# Patient Record
Sex: Male | Born: 1965
Health system: Southern US, Community
[De-identification: ages and names within clinical notes are randomized; demographics above are authoritative.]

## PROBLEM LIST (undated history)

## (undated) DIAGNOSIS — E119 Type 2 diabetes mellitus without complications: Secondary | ICD-10-CM

## (undated) DIAGNOSIS — I739 Peripheral vascular disease, unspecified: Secondary | ICD-10-CM

## (undated) DIAGNOSIS — T148XXA Other injury of unspecified body region, initial encounter: Secondary | ICD-10-CM

## (undated) DIAGNOSIS — J189 Pneumonia, unspecified organism: Secondary | ICD-10-CM

## (undated) DIAGNOSIS — M1990 Unspecified osteoarthritis, unspecified site: Secondary | ICD-10-CM

## (undated) DIAGNOSIS — A4902 Methicillin resistant Staphylococcus aureus infection, unspecified site: Secondary | ICD-10-CM

## (undated) DIAGNOSIS — F32A Depression, unspecified: Secondary | ICD-10-CM

## (undated) DIAGNOSIS — F329 Major depressive disorder, single episode, unspecified: Secondary | ICD-10-CM

## (undated) DIAGNOSIS — R51 Headache: Secondary | ICD-10-CM

## (undated) DIAGNOSIS — E785 Hyperlipidemia, unspecified: Secondary | ICD-10-CM

## (undated) DIAGNOSIS — K219 Gastro-esophageal reflux disease without esophagitis: Secondary | ICD-10-CM

## (undated) DIAGNOSIS — I10 Essential (primary) hypertension: Secondary | ICD-10-CM

## (undated) DIAGNOSIS — G709 Myoneural disorder, unspecified: Secondary | ICD-10-CM

## (undated) DIAGNOSIS — F419 Anxiety disorder, unspecified: Secondary | ICD-10-CM

## (undated) HISTORY — DX: Depression, unspecified: F32.A

## (undated) HISTORY — DX: Hyperlipidemia, unspecified: E78.5

## (undated) HISTORY — DX: Headache: R51

## (undated) HISTORY — DX: Anxiety disorder, unspecified: F41.9

## (undated) HISTORY — PX: TONSILLECTOMY: SUR1361

## (undated) HISTORY — DX: Myoneural disorder, unspecified: G70.9

## (undated) HISTORY — DX: Major depressive disorder, single episode, unspecified: F32.9

## (undated) HISTORY — PX: ROTATOR CUFF REPAIR: SHX139

## (undated) HISTORY — DX: Unspecified osteoarthritis, unspecified site: M19.90

## (undated) HISTORY — DX: Essential (primary) hypertension: I10

## (undated) HISTORY — DX: Gastro-esophageal reflux disease without esophagitis: K21.9

---

## 2000-09-15 ENCOUNTER — Emergency Department (HOSPITAL_COMMUNITY): Admission: EM | Admit: 2000-09-15 | Discharge: 2000-09-15 | Payer: Self-pay | Admitting: *Deleted

## 2003-11-04 ENCOUNTER — Ambulatory Visit (HOSPITAL_COMMUNITY): Admission: RE | Admit: 2003-11-04 | Discharge: 2003-11-04 | Payer: Self-pay | Admitting: Orthopedic Surgery

## 2004-01-16 ENCOUNTER — Ambulatory Visit (HOSPITAL_BASED_OUTPATIENT_CLINIC_OR_DEPARTMENT_OTHER): Admission: RE | Admit: 2004-01-16 | Discharge: 2004-01-16 | Payer: Self-pay | Admitting: Orthopedic Surgery

## 2004-04-06 ENCOUNTER — Ambulatory Visit (HOSPITAL_COMMUNITY): Admission: RE | Admit: 2004-04-06 | Discharge: 2004-04-06 | Payer: Self-pay | Admitting: Orthopedic Surgery

## 2004-11-10 ENCOUNTER — Encounter: Admission: RE | Admit: 2004-11-10 | Discharge: 2004-11-10 | Payer: Self-pay | Admitting: Internal Medicine

## 2010-07-21 ENCOUNTER — Emergency Department (HOSPITAL_COMMUNITY): Admission: EM | Admit: 2010-07-21 | Discharge: 2010-07-22 | Payer: Self-pay | Admitting: Emergency Medicine

## 2010-10-12 ENCOUNTER — Encounter: Payer: Self-pay | Admitting: Internal Medicine

## 2010-10-12 ENCOUNTER — Ambulatory Visit (INDEPENDENT_AMBULATORY_CARE_PROVIDER_SITE_OTHER): Payer: BC Managed Care – PPO | Admitting: Internal Medicine

## 2010-10-12 ENCOUNTER — Encounter (INDEPENDENT_AMBULATORY_CARE_PROVIDER_SITE_OTHER): Payer: Self-pay | Admitting: *Deleted

## 2010-10-12 ENCOUNTER — Ambulatory Visit (INDEPENDENT_AMBULATORY_CARE_PROVIDER_SITE_OTHER)
Admission: RE | Admit: 2010-10-12 | Discharge: 2010-10-12 | Disposition: A | Discharge status: Home / Self Care | Payer: BC Managed Care – PPO | Source: Ambulatory Visit | Attending: Internal Medicine | Admitting: Internal Medicine

## 2010-10-12 ENCOUNTER — Other Ambulatory Visit: Payer: Self-pay | Admitting: Internal Medicine

## 2010-10-12 ENCOUNTER — Other Ambulatory Visit: Payer: BC Managed Care – PPO

## 2010-10-12 DIAGNOSIS — R51 Headache: Secondary | ICD-10-CM | POA: Insufficient documentation

## 2010-10-12 DIAGNOSIS — M1990 Unspecified osteoarthritis, unspecified site: Secondary | ICD-10-CM

## 2010-10-12 DIAGNOSIS — B351 Tinea unguium: Secondary | ICD-10-CM

## 2010-10-12 DIAGNOSIS — R0609 Other forms of dyspnea: Secondary | ICD-10-CM | POA: Insufficient documentation

## 2010-10-12 DIAGNOSIS — Z8709 Personal history of other diseases of the respiratory system: Secondary | ICD-10-CM | POA: Insufficient documentation

## 2010-10-12 DIAGNOSIS — G4730 Sleep apnea, unspecified: Secondary | ICD-10-CM

## 2010-10-12 DIAGNOSIS — E785 Hyperlipidemia, unspecified: Secondary | ICD-10-CM

## 2010-10-12 DIAGNOSIS — K219 Gastro-esophageal reflux disease without esophagitis: Secondary | ICD-10-CM | POA: Insufficient documentation

## 2010-10-12 DIAGNOSIS — R0989 Other specified symptoms and signs involving the circulatory and respiratory systems: Secondary | ICD-10-CM

## 2010-10-12 DIAGNOSIS — F17200 Nicotine dependence, unspecified, uncomplicated: Secondary | ICD-10-CM | POA: Insufficient documentation

## 2010-10-12 DIAGNOSIS — F411 Generalized anxiety disorder: Secondary | ICD-10-CM | POA: Insufficient documentation

## 2010-10-12 DIAGNOSIS — I10 Essential (primary) hypertension: Secondary | ICD-10-CM

## 2010-10-12 DIAGNOSIS — F329 Major depressive disorder, single episode, unspecified: Secondary | ICD-10-CM

## 2010-10-12 DIAGNOSIS — R519 Headache, unspecified: Secondary | ICD-10-CM | POA: Insufficient documentation

## 2010-10-12 DIAGNOSIS — F3289 Other specified depressive episodes: Secondary | ICD-10-CM | POA: Insufficient documentation

## 2010-10-12 DIAGNOSIS — F1011 Alcohol abuse, in remission: Secondary | ICD-10-CM | POA: Insufficient documentation

## 2010-10-12 LAB — HEPATIC FUNCTION PANEL
ALT: 39 U/L (ref 0–53)
AST: 31 U/L (ref 0–37)
Albumin: 4.2 g/dL (ref 3.5–5.2)
Alkaline Phosphatase: 75 U/L (ref 39–117)
Total Protein: 6.9 g/dL (ref 6.0–8.3)

## 2010-10-12 LAB — CBC WITH DIFFERENTIAL/PLATELET
Basophils Relative: 0.4 % (ref 0.0–3.0)
HCT: 47.9 % (ref 39.0–52.0)
Hemoglobin: 16.8 g/dL (ref 13.0–17.0)
Lymphocytes Relative: 20.4 % (ref 12.0–46.0)
Monocytes Relative: 9.4 % (ref 3.0–12.0)
Neutro Abs: 6.9 10*3/uL (ref 1.4–7.7)
RBC: 5.11 Mil/uL (ref 4.22–5.81)

## 2010-10-12 LAB — LIPID PANEL
HDL: 40.9 mg/dL (ref >39.00)
Total CHOL/HDL Ratio: 7
Triglycerides: 341 mg/dL — ABNORMAL HIGH (ref 0.0–149.0)

## 2010-10-12 LAB — BASIC METABOLIC PANEL
CO2: 29 mEq/L (ref 19–32)
Calcium: 9.5 mg/dL (ref 8.4–10.5)
GFR: 94.86 mL/min (ref >60.00)
Potassium: 5 mEq/L (ref 3.5–5.1)
Sodium: 139 mEq/L (ref 135–145)

## 2010-10-13 ENCOUNTER — Encounter: Payer: Self-pay | Admitting: Internal Medicine

## 2010-10-20 ENCOUNTER — Encounter: Payer: Self-pay | Admitting: Internal Medicine

## 2010-10-20 ENCOUNTER — Encounter (INDEPENDENT_AMBULATORY_CARE_PROVIDER_SITE_OTHER): Payer: BC Managed Care – PPO

## 2010-10-20 ENCOUNTER — Ambulatory Visit (INDEPENDENT_AMBULATORY_CARE_PROVIDER_SITE_OTHER): Payer: BC Managed Care – PPO | Admitting: Internal Medicine

## 2010-10-20 DIAGNOSIS — E785 Hyperlipidemia, unspecified: Secondary | ICD-10-CM

## 2010-10-20 DIAGNOSIS — I10 Essential (primary) hypertension: Secondary | ICD-10-CM

## 2010-10-20 DIAGNOSIS — R0609 Other forms of dyspnea: Secondary | ICD-10-CM

## 2010-10-20 DIAGNOSIS — F411 Generalized anxiety disorder: Secondary | ICD-10-CM

## 2010-10-20 NOTE — Letter (Signed)
Summary: Lipid Letter  [facility]  [address] [place], [place] [address]   Phone: [phone]  Fax: [phone]    10/13/2010  Jason [name] [address] [place], [place]  [address]  Dear Jason [name]:  We have carefully reviewed your last lipid profile from  and the results are noted below with a summary of recommendations for lipid management.    Cholesterol:       288     Goal: <200   HDL "good" Cholesterol:   57.84     Goal: >40   LDL "bad" Cholesterol:   188     Goal: <130 wow, this is high   Triglycerides:       341.0     Goal: <150    please follow-up soon    TLC Diet (Therapeutic Lifestyle Change): Saturated Fats & Transfatty acids should be kept < 7% of total calories ***Reduce Saturated Fats Polyunstaurated Fat can be up to 10% of total calories Monounsaturated Fat Fat can be up to 20% of total calories Total Fat should be no greater than 25-35% of total calories Carbohydrates should be 50-60% of total calories Protein should be approximately 15% of total calories Fiber should be at least 20-30 grams a day ***Increased fiber may help lower LDL Total Cholesterol should be < 200mg /day Consider adding plant stanol/sterols to diet (example: Benacol spread) ***A higher intake of unsaturated fat may reduce Triglycerides and Increase HDL    Adjunctive Measures (may lower LIPIDS and reduce risk of Heart Attack) include: Aerobic Exercise (20-30 minutes 3-4 times a week) Limit Alcohol Consumption Weight Reduction Aspirin 75-81 mg a day by mouth (if not allergic or contraindicated) Dietary Fiber 20-30 grams a day by mouth     Current Medications: 1)    Sertraline Hcl 50 Mg Tabs (Sertraline hcl) .... One by mouth at bedtime 2)    Benicar 20 Mg Tabs (Olmesartan medoxomil) .... One by mouth once daily for high blood pressure  If you have any questions, please call. We appreciate being able to work with you.   Sincerely,    [facility] Etta Grandchild MD

## 2010-10-20 NOTE — Assessment & Plan Note (Signed)
Summary: NEW [employer] PT--#---PKG---STC   Vital Signs:  Patient profile:   45 year old male Height:      70 inches Weight:      214 pounds BMI:     30.82 O2 Sat:      97 % on Room air Temp:     98.2 degrees F oral Pulse rate:   74 / minute Pulse rhythm:   regular Resp:     16 per minute BP supine:   152 / 84  (right arm) BP sitting:   150 / 80  (left arm) Cuff size:   large  Vitals Entered By: Rock Nephew CMA (October 12, 2010 9:05 AM)  Nutrition Counseling: Patient's BMI is greater than 25 and therefore counseled on weight management options.  O2 Flow:  Room air  Primary Care Provider:  Etta Grandchild MD   History of Present Illness: new to me with multiple complaints..... 1. SOB for several months and recent hx. of PNA 2. sleep apnea - his wife says that he does not breathe for long periods at night 3. discolored great toenails 4. feeling anxious for more than one year 5. Htn. evaluation  Dyspepsia History:      He has no alarm features of dyspepsia including no history of melena, hematochezia, dysphagia, persistent vomiting, or involuntary weight loss > 5%.  There is a prior history of GERD.     Preventive Screening-Counseling & Management  Alcohol-Tobacco     Alcohol drinks/day: >5     Alcohol type: beer     >5/day in last 3 mos: yes     Alcohol Counseling: to STOP drinking     Feels need to cut down: no     Feels annoyed by complaints: no     Feels guilty re: drinking: no     Needs 'eye opener' in am: no     Smoking Status: current     Smoking Cessation Counseling: yes     Smoke Cessation Stage: precontemplative     Packs/Day: 2.0     Year Started: 1982     Pack years: 39     Tobacco Counseling: to quit use of tobacco products  Caffeine-Diet-Exercise     Does Patient Exercise: no  Hep-HIV-STD-Contraception     Hepatitis Risk: no risk noted     HIV Risk: no risk noted     STD Risk: no risk noted  Safety-Violence-Falls     Seat Belt Use: no    Sexual History:  currently monogamous.        Drug Use:  no.        Blood Transfusions:  no.    Medications Prior to Update: 1)  None  Current Medications (verified): 1)  Sertraline Hcl 50 Mg Tabs (Sertraline Hcl) .... One By Mouth At Bedtime  Allergies (verified): No Known Drug Allergies  Past History:  Past Medical History: Anxiety Depression GERD Headache Hyperlipidemia Hypertension Osteoarthritis Pneumonia, hx of - 3 months ago  Past Surgical History: Rotator cuff repair  Family History: Family History Heart Disease Family History Stroke Family History Emotional/Mental Illness  Social History: Occupation:Electrician Married Current Smoker Alcohol use-yes 12 pack per day of beer Drug use-no Regular exercise-no Smoking Status:  current Drug Use:  no Does Patient Exercise:  no Education:  Environmental manager Use:  no Packs/Day:  2.0 Hepatitis Risk:  no risk noted HIV Risk:  no risk noted STD Risk:  no risk noted Sexual History:  currently monogamous Blood  Transfusions:  no  Review of Systems       The patient complains of weight gain, dyspnea on exertion, prolonged cough, and depression.  The patient denies anorexia, fever, weight loss, decreased hearing, hoarseness, chest pain, syncope, peripheral edema, headaches, hemoptysis, abdominal pain, melena, hematochezia, severe indigestion/heartburn, hematuria, genital sores, muscle weakness, suspicious skin lesions, difficulty walking, unusual weight change, abnormal bleeding, enlarged lymph nodes, angioedema, and testicular masses.   CV:  Denies chest pain or discomfort, difficulty breathing at night, difficulty breathing while lying down, fainting, fatigue, leg cramps with exertion, lightheadness, near fainting, palpitations, and swelling of feet. Resp:  Complains of cough, excessive snoring, hypersomnolence, morning headaches, and shortness of breath; denies chest discomfort, chest pain with inspiration,  coughing up blood, pleuritic, sputum productive, and wheezing. Derm:  Complains of changes in nail beds; denies changes in color of skin, dryness, excessive perspiration, flushing, hair loss, insect bite(s), itching, lesion(s), poor wound healing, and rash. Psych:  Complains of anxiety and depression; denies easily angered, easily tearful, irritability, mental problems, panic attacks, sense of great danger, suicidal thoughts/plans, thoughts of violence, and unusual visions or sounds.  Physical Exam  General:  alert, well-developed, well-nourished, well-hydrated, appropriate dress, cooperative to examination, good hygiene, and overweight-appearing.   Head:  normocephalic and atraumatic.   Eyes:  vision grossly intact and pupils equal.   Ears:  R ear normal and L ear normal.   Mouth:  Oral mucosa and oropharynx without lesions or exudates.  Teeth in good repair. Neck:  supple, full ROM, no masses, no thyromegaly, no JVD, normal carotid upstroke, no carotid bruits, no cervical lymphadenopathy, and no neck tenderness.   Chest Wall:  No deformities, masses, tenderness or gynecomastia noted. Lungs:  normal respiratory effort, no intercostal retractions, no accessory muscle use, normal breath sounds, no dullness, no fremitus, no crackles, and no wheezes.   Heart:  normal rate, regular rhythm, no murmur, no gallop, no rub, and no JVD.   Abdomen:  soft, non-tender, normal bowel sounds, no distention, no masses, no guarding, no rigidity, no rebound tenderness, no abdominal hernia, no inguinal hernia, no hepatomegaly, and no splenomegaly.   Msk:  No deformity or scoliosis noted of thoracic or lumbar spine.   Pulses:  R and L carotid,radial,femoral,dorsalis pedis and posterior tibial pulses are full and equal bilaterally Extremities:  No clubbing, cyanosis, edema, or deformity noted with normal full range of motion of all joints.   Neurologic:  No cranial nerve deficits noted. Station and gait are normal.  Plantar reflexes are down-going bilaterally. DTRs are symmetrical throughout. Sensory, motor and coordinative functions appear intact. Skin:  both great toenails show mild thickening with subungual debris, all the other nails are normal. turgor normal, color normal, no rashes, no suspicious lesions, no ecchymoses, no petechiae, no purpura, no ulcerations, and no edema.   Cervical Nodes:  no anterior cervical adenopathy and no posterior cervical adenopathy.   Axillary Nodes:  no R axillary adenopathy and no L axillary adenopathy.   Inguinal Nodes:  no R inguinal adenopathy and no L inguinal adenopathy.   Psych:  Oriented X3, memory intact for recent and remote, normally interactive, good eye contact, not depressed appearing, not agitated, not suicidal, not homicidal, and slightly anxious.   Additional Exam:  EKG is normal   Impression & Recommendations:  Problem # 1:  HYPERTENSION (ICD-401.9)  Orders: Venipuncture (29562) TLB-Lipid Panel (80061-LIPID) TLB-BMP (Basic Metabolic Panel-BMET) (80048-METABOL) TLB-CBC Platelet - w/Differential (85025-CBCD) TLB-Hepatic/Liver Function Pnl (80076-HEPATIC) TLB-TSH (Thyroid Stimulating Hormone) (  84443-TSH) TLB-BNP (B-Natriuretic Peptide) (83880-BNPR) EKG w/ Interpretation (93000) Tobacco use cessation intermediate 3-10 minutes (71696)  His updated medication list for this problem includes:    Benicar 20 Mg Tabs (Olmesartan medoxomil) ..... One by mouth once daily for high blood pressure  BP today: 150/80  Problem # 2:  ANXIETY (ICD-300.00) Assessment: New  His updated medication list for this problem includes:    Sertraline Hcl 50 Mg Tabs (Sertraline hcl) ..... One by mouth at bedtime  Discussed medication use and relaxation techniques.   Problem # 3:  DYSPNEA/SHORTNESS OF BREATH (ICD-786.09) Assessment: New I do not think this is cardiac but I do think it is respiratory and probably COPD - I have ordered a f/up chest xray and PFT's and  asked him to stop smoking Orders: Venipuncture (78938) TLB-Lipid Panel (80061-LIPID) TLB-BMP (Basic Metabolic Panel-BMET) (80048-METABOL) TLB-CBC Platelet - w/Differential (85025-CBCD) TLB-Hepatic/Liver Function Pnl (80076-HEPATIC) TLB-TSH (Thyroid Stimulating Hormone) (84443-TSH) TLB-BNP (B-Natriuretic Peptide) (83880-BNPR) T-2 View CXR (71020TC) Pulmonary Referral (Pulmonary) EKG w/ Interpretation (93000) Tobacco use cessation intermediate 3-10 minutes (10175)  Problem # 4:  ONYCHOMYCOSIS, TOENAILS (ICD-110.1) Assessment: New no treatment started at this time, I will check his LFT's today and see if he is a candidate for lamisil but I worry about his EtOH intake  Problem # 5:  SLEEP APNEA (ICD-780.57) Assessment: New I think he needs a sleep study Orders: Venipuncture (10258) TLB-Lipid Panel (80061-LIPID) TLB-BMP (Basic Metabolic Panel-BMET) (80048-METABOL) TLB-CBC Platelet - w/Differential (85025-CBCD) TLB-Hepatic/Liver Function Pnl (80076-HEPATIC) TLB-TSH (Thyroid Stimulating Hormone) (84443-TSH) TLB-BNP (B-Natriuretic Peptide) (83880-BNPR) Sleep Disorder Referral (Sleep Disorder)  Problem # 6:  ETHANOL ABUSE (ICD-305.00) Assessment: New  Problem # 7:  TOBACCO USE (ICD-305.1) Assessment: New  Orders: Tobacco use cessation intermediate 3-10 minutes (52778)  Encouraged smoking cessation and discussed different methods for smoking cessation.   Complete Medication List: 1)  Sertraline Hcl 50 Mg Tabs (Sertraline hcl) .... One by mouth at bedtime 2)  Benicar 20 Mg Tabs (Olmesartan medoxomil) .... One by mouth once daily for high blood pressure  Patient Instructions: 1)  Please schedule a follow-up appointment in 2 weeks. 2)  Tobacco is very bad for your health and your loved ones! You Should stop smoking!. 3)  Stop Smoking Tips: Choose a Quit date. Cut down before the Quit date. decide what you will do as a substitute when you feel the urge to  smoke(gum,toothpick,exercise). 4)  It is important that you exercise regularly at least 20 minutes 5 times a week. If you develop chest pain, have severe difficulty breathing, or feel very tired , stop exercising immediately and seek medical attention. 5)  You need to lose weight. Consider a lower calorie diet and regular exercise.  6)  It is not healthy  for men to drink more than 2-3 drinks per day or for women to drink more than 1-2 drinks per day. 7)  Check your Blood Pressure regularly. If it is above: you should make an appointment. Prescriptions: BENICAR 20 MG TABS (OLMESARTAN MEDOXOMIL) One by mouth once daily for high blood pressure  #56 x 0   Entered and Authorized by:   Etta Grandchild MD   Signed by:   Etta Grandchild MD on 10/12/2010   Method used:   Samples Given   RxID:   [id number] SERTRALINE HCL 50 MG TABS (SERTRALINE HCL) One by mouth at bedtime  #30 x 11   Entered and Authorized by:   Etta Grandchild MD   Signed by:  Etta Grandchild MD on 10/12/2010   Method used:   Print then Give to Patient   RxID:   [id number]    Orders Added: 1)  Venipuncture [14782] 2)  TLB-Lipid Panel [80061-LIPID] 3)  TLB-BMP (Basic Metabolic Panel-BMET) [80048-METABOL] 4)  TLB-CBC Platelet - w/Differential [85025-CBCD] 5)  TLB-Hepatic/Liver Function Pnl [80076-HEPATIC] 6)  TLB-TSH (Thyroid Stimulating Hormone) [84443-TSH] 7)  TLB-BNP (B-Natriuretic Peptide) [83880-BNPR] 8)  T-2 View CXR [71020TC] 9)  Sleep Disorder Referral [Sleep Disorder] 10)  Pulmonary Referral [Pulmonary] 11)  EKG w/ Interpretation [93000] 12)  Tobacco use cessation intermediate 3-10 minutes [99406] 13)  New Patient Level V [99205]    Preventive Care Screening  Last Tetanus Booster:    Date:  08/29/2002    Results:  Historical    Prevention & Chronic Care Immunizations   Influenza vaccine: Not documented   Influenza vaccine deferral: Refused  (10/12/2010)    Tetanus booster: 08/29/2002:  Historical    Pneumococcal vaccine: Not documented  Other Screening   Smoking status: current  (10/12/2010)   Smoking cessation counseling: yes  (10/12/2010)  Lipids   Total Cholesterol: Not documented   LDL: Not documented   LDL Direct: Not documented   HDL: Not documented   Triglycerides: Not documented    SGOT (AST): Not documented   SGPT (ALT): Not documented   Alkaline phosphatase: Not documented   Total bilirubin: Not documented  Hypertension   Last Blood Pressure: 150 / 80  (10/12/2010)   Serum creatinine: Not documented   Serum potassium Not documented  Self-Management Support :    Hypertension self-management support: Not documented    Lipid self-management support: Not documented

## 2010-10-22 ENCOUNTER — Encounter: Payer: Self-pay | Admitting: Internal Medicine

## 2010-10-26 NOTE — Assessment & Plan Note (Signed)
Summary: PROBLEMS WITH BP MEDS/NWS   Vital Signs:  Patient profile:   45 year old male Height:      70 inches Weight:      214 pounds BMI:     30.82 O2 Sat:      98 % on Room air Temp:     98.0 degrees F oral Pulse rate:   74 / minute Pulse rhythm:   regular Resp:     16 per minute BP sitting:   152 / 80  (left arm) Cuff size:   large  Vitals Entered By: Rock Nephew CMA (October 20, 2010 8:30 AM)  Nutrition Counseling: Patient's BMI is greater than 25 and therefore counseled on weight management options.  O2 Flow:  Room air CC: follow-up visit// Pt also c/o dizziness since starting ne medication Is Patient Diabetic? No Pain Assessment Patient in pain? no       Does patient need assistance? Functional Status Self care Ambulation Normal   Primary Care Provider:  Etta Grandchild MD  CC:  follow-up visit// Pt also c/o dizziness since starting ne medication.  History of Present Illness:  Hypertension Follow-Up      This is a 45 year old man who presents for Hypertension follow-up.  The patient reports lightheadedness, but denies urinary frequency, headaches, edema, impotence, rash, and fatigue.  The patient denies the following associated symptoms: chest pain, chest pressure, exercise intolerance, dyspnea, palpitations, syncope, leg edema, and pedal edema.  Compliance with medications (by patient report) has been near 100%.  The patient reports that dietary compliance has been fair.  The patient reports no exercise.  Adjunctive measures currently used by the patient include salt restriction and relaxation.    Preventive Screening-Counseling & Management  Alcohol-Tobacco     Alcohol drinks/day: >5     Alcohol type: beer     >5/day in last 3 mos: yes     Alcohol Counseling: to STOP drinking     Feels need to cut down: no     Feels annoyed by complaints: no     Feels guilty re: drinking: no     Needs 'eye opener' in am: no     Smoking Status: current     Smoking  Cessation Counseling: yes     Smoke Cessation Stage: precontemplative     Packs/Day: 2.0     Year Started: 1982     Pack years: 88     Tobacco Counseling: to quit use of tobacco products  Hep-HIV-STD-Contraception     Hepatitis Risk: no risk noted     HIV Risk: no risk noted     STD Risk: no risk noted      Sexual History:  currently monogamous.        Drug Use:  no.        Blood Transfusions:  no.    Clinical Review Panels:  Immunizations   Last Tetanus Booster:  Historical (08/29/2002)  Lipid Management   Cholesterol:  288 (10/12/2010)   HDL (good cholesterol):  40.90 (10/12/2010)  Diabetes Management   Creatinine:  0.9 (10/12/2010)  CBC   WBC:  10.5 (10/12/2010)   RBC:  5.11 (10/12/2010)   Hgb:  16.8 (10/12/2010)   Hct:  47.9 (10/12/2010)   Platelets:  254.0 (10/12/2010)   MCV  93.7 (10/12/2010)   MCHC  35.1 (10/12/2010)   RDW  14.0 (10/12/2010)   PMN:  65.8 (10/12/2010)   Lymphs:  20.4 (10/12/2010)   Monos:  9.4 (10/12/2010)  Eosinophils:  4.0 (10/12/2010)   Basophil:  0.4 (10/12/2010)  Complete Metabolic Panel   Glucose:  99 (10/12/2010)   Sodium:  139 (10/12/2010)   Potassium:  5.0 (10/12/2010)   Chloride:  102 (10/12/2010)   CO2:  29 (10/12/2010)   BUN:  11 (10/12/2010)   Creatinine:  0.9 (10/12/2010)   Albumin:  4.2 (10/12/2010)   Total Protein:  6.9 (10/12/2010)   Calcium:  9.5 (10/12/2010)   Total Bili:  0.5 (10/12/2010)   Alk Phos:  75 (10/12/2010)   SGPT (ALT):  39 (10/12/2010)   SGOT (AST):  31 (10/12/2010)   Medications Prior to Update: 1)  Sertraline Hcl 50 Mg Tabs (Sertraline Hcl) .... One By Mouth At Bedtime 2)  Benicar 20 Mg Tabs (Olmesartan Medoxomil) .... One By Mouth Once Daily For High Blood Pressure  Current Medications (verified): 1)  Sertraline Hcl 50 Mg Tabs (Sertraline Hcl) .... One By Mouth At Bedtime 2)  Livalo 4 Mg Tabs (Pitavastatin Calcium) .... One By Mouth Once Daily For Cholesterol  Allergies (verified): 1)  !  Benicar (Olmesartan Medoxomil)  Past History:  Past Medical History: Last updated: 10/12/2010 Anxiety Depression GERD Headache Hyperlipidemia Hypertension Osteoarthritis Pneumonia, hx of - 3 months ago  Past Surgical History: Last updated: 10/12/2010 Rotator cuff repair  Family History: Last updated: 10/12/2010 Family History Heart Disease Family History Stroke Family History Emotional/Mental Illness  Social History: Last updated: 10/20/2010 Occupation: Personnel officer Married Current Smoker Alcohol use-yes 12 pack per day of beer Drug use-no Regular exercise-no  Risk Factors: Alcohol Use: >5 (10/20/2010) >5 drinks/d w/in last 3 months: yes (10/20/2010) Exercise: no (10/12/2010)  Risk Factors: Smoking Status: current (10/20/2010) Packs/Day: 2.0 (10/20/2010)  Family History: Reviewed history from 10/12/2010 and no changes required. Family History Heart Disease Family History Stroke Family History Emotional/Mental Illness  Social History: Reviewed history from 10/12/2010 and no changes required. Occupation: Personnel officer Married Current Smoker Alcohol use-yes 12 pack per day of beer Drug use-no Regular exercise-no  Review of Systems  The patient denies hoarseness, chest pain, syncope, dyspnea on exertion, peripheral edema, prolonged cough, headaches, hemoptysis, abdominal pain, suspicious skin lesions, transient blindness, difficulty walking, and depression.   Psych:  Denies anxiety, depression, easily angered, easily tearful, irritability, mental problems, panic attacks, sense of great danger, suicidal thoughts/plans, thoughts of violence, unusual visions or sounds, and thoughts /plans of harming others.  Physical Exam  General:  alert, well-developed, well-nourished, well-hydrated, appropriate dress, cooperative to examination, good hygiene, and overweight-appearing.   Head:  normocephalic and atraumatic.   Mouth:  Oral mucosa and oropharynx without  lesions or exudates.  Teeth in good repair. Neck:  supple, full ROM, no masses, no thyromegaly, no JVD, normal carotid upstroke, no carotid bruits, no cervical lymphadenopathy, and no neck tenderness.   Lungs:  normal respiratory effort, no intercostal retractions, no accessory muscle use, normal breath sounds, no dullness, no fremitus, no crackles, and no wheezes.   Heart:  normal rate, regular rhythm, no murmur, no gallop, no rub, and no JVD.   Abdomen:  soft, non-tender, normal bowel sounds, no distention, no masses, no guarding, no rigidity, no rebound tenderness, no abdominal hernia, no inguinal hernia, no hepatomegaly, and no splenomegaly.   Msk:  No deformity or scoliosis noted of thoracic or lumbar spine.   Pulses:  R and L carotid,radial,femoral,dorsalis pedis and posterior tibial pulses are full and equal bilaterally Extremities:  No clubbing, cyanosis, edema, or deformity noted with normal full range of motion of all joints.  Neurologic:  No cranial nerve deficits noted. Station and gait are normal. Plantar reflexes are down-going bilaterally. DTRs are symmetrical throughout. Sensory, motor and coordinative functions appear intact. Skin:  both great toenails show mild thickening with subungual debris, all the other nails are normal. turgor normal, color normal, no rashes, no suspicious lesions, no ecchymoses, no petechiae, no purpura, no ulcerations, and no edema.   Cervical Nodes:  no anterior cervical adenopathy and no posterior cervical adenopathy.   Inguinal Nodes:  no R inguinal adenopathy and no L inguinal adenopathy.   Psych:  Oriented X3, memory intact for recent and remote, normally interactive, good eye contact, not anxious appearing, not depressed appearing, not agitated, and not suicidal.   Additional Exam:  EKG is normal today and unchanged from his prior EKG   Impression & Recommendations:  Problem # 1:  HYPERLIPIDEMIA (ICD-272.4) Assessment Deteriorated  His updated  medication list for this problem includes:    Livalo 4 Mg Tabs (Pitavastatin calcium) ..... One by mouth once daily for cholesterol  Labs Reviewed: SGOT: 31 (10/12/2010)   SGPT: 39 (10/12/2010)   HDL:40.90 (10/12/2010)  Chol:288 (10/12/2010)  Trig:341.0 (10/12/2010)  Orders: Nutrition Referral (Nutrition)  Problem # 2:  HYPERTENSION (ICD-401.9) Assessment: Deteriorated  The following medications were removed from the medication list:    Benicar 20 Mg Tabs (Olmesartan medoxomil) ..... One by mouth once daily for high blood pressure  Orders: Nutrition Referral (Nutrition) EKG w/ Interpretation (93000)  BP today: 152/80 Prior BP: 152/84 (10/12/2010)  Labs Reviewed: K+: 5.0 (10/12/2010) Creat: : 0.9 (10/12/2010)   Chol: 288 (10/12/2010)   HDL: 40.90 (10/12/2010)   TG: 341.0 (10/12/2010)  Problem # 3:  ANXIETY (ICD-300.00) Assessment: Improved  His updated medication list for this problem includes:    Sertraline Hcl 50 Mg Tabs (Sertraline hcl) ..... One by mouth at bedtime  Complete Medication List: 1)  Sertraline Hcl 50 Mg Tabs (Sertraline hcl) .... One by mouth at bedtime 2)  Livalo 4 Mg Tabs (Pitavastatin calcium) .... One by mouth once daily for cholesterol  Patient Instructions: 1)  Please schedule a follow-up appointment in 1 month. 2)  Tobacco is very bad for your health and your loved ones! You Should stop smoking!. 3)  Stop Smoking Tips: Choose a Quit date. Cut down before the Quit date. decide what you will do as a substitute when you feel the urge to smoke(gum,toothpick,exercise). 4)  It is important that you exercise regularly at least 20 minutes 5 times a week. If you develop chest pain, have severe difficulty breathing, or feel very tired , stop exercising immediately and seek medical attention. 5)  You need to lose weight. Consider a lower calorie diet and regular exercise.  6)  It is not healthy  for men to drink more than 2-3 drinks per day or for women to drink  more than 1-2 drinks per day. 7)  Check your Blood Pressure regularly. If it is above 140/90: you should make an appointment. Prescriptions: LIVALO 4 MG TABS (PITAVASTATIN CALCIUM) One by mouth once daily for cholesterol  #84 x 0   Entered and Authorized by:   Etta Grandchild MD   Signed by:   Etta Grandchild MD on 10/20/2010   Method used:   Samples Given   RxID:   [id number]    Orders Added: 1)  Nutrition Referral [Nutrition] 2)  EKG w/ Interpretation [93000] 3)  Est. Patient Level IV [08657]

## 2010-10-29 ENCOUNTER — Institutional Professional Consult (permissible substitution) (INDEPENDENT_AMBULATORY_CARE_PROVIDER_SITE_OTHER): Payer: BC Managed Care – PPO | Admitting: Pulmonary Disease

## 2010-10-29 ENCOUNTER — Encounter: Payer: Self-pay | Admitting: Pulmonary Disease

## 2010-10-29 DIAGNOSIS — G4730 Sleep apnea, unspecified: Secondary | ICD-10-CM

## 2010-11-02 ENCOUNTER — Encounter: Payer: Self-pay | Admitting: Pulmonary Disease

## 2010-11-04 NOTE — Assessment & Plan Note (Signed)
Summary: SOB PER DEBRA- DR Maisie Fus JONES///KP   Allergies: 1)  ! Benicar (Olmesartan Medoxomil)   Other Orders: Carbon Monoxide diffusing w/capacity (81191) Lung Volumes/Gas dilution or washout (47829) Spirometry (Pre & Post) [phone])

## 2010-11-04 NOTE — Assessment & Plan Note (Signed)
Summary: SLEEP CONSULT PER DR Sanda Linger- [name]   Visit Type:  Initial Consult Copy to:  Sanda Linger MD Primary Provider/Referring Provider:  Etta Grandchild MD  CC:  Sleep Consult. Pt c/o waking up multiple times at night, snores, and and stops breathing at night.  History of Present Illness: 45 yo male for sleep evaluation.  His wife has noted that he snores, and stops breathing while asleep.  He wakes up at times hearing himself snore.  He does not feel like he has any trouble with his sleep otherwise, and this is more something his wife noticed.  He goes to bed at 9pm.  He falls asleep quickly.  He does not use anything to help sleep.  He wakes up several times during the night.  He wakes up at 530am, and feels okay.  He denies headaches.  He gets sleepy at times in the afternoon.  He would like to take naps, but doesn't have the time to do so.  He is not using anything to stay awake.  He talks in his sleep.  He denies sleep walking, bruxism, or nightmares.  He is a restless sleeper, but denies restless legs.  There is no history of sleep hallucinations, sleep paralysis, or cataplexy.  He drinks at least 12 beers per day.  He smokes 2 packs per day.  There is no history of thyroid disease.  He was started on sertraline a few weeks ago, and this has helped his mood.  He thinks his sleep is better also.  His weight has been steady.  His Epworth score is 11 out of 24.  Current Medications (verified): 1)  Sertraline Hcl 50 Mg Tabs (Sertraline Hcl) .... One By Mouth At Bedtime 2)  Livalo 4 Mg Tabs (Pitavastatin Calcium) .... One By Mouth Once Daily For Cholesterol  Allergies (verified): 1)  ! Benicar (Olmesartan Medoxomil)  Past History:  Past Medical History: Reviewed history from 10/12/2010 and no changes required. Anxiety Depression GERD Headache Hyperlipidemia Hypertension Osteoarthritis Pneumonia, hx of - 3 months ago  Past Surgical History: Reviewed history from  10/12/2010 and no changes required. Rotator cuff repair  Family History: Reviewed history from 10/12/2010 and no changes required. Family History Heart Disease Family History Stroke: pgf Family History Emotional/Mental Illness asthma: mgf emphysema: father MI: father  Social History: Reviewed history from 10/20/2010 and no changes required. Occupation: Personnel officer Married Current Smoker. 2 ppd. 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  The patient denies shortness of breath at rest, productive cough, non-productive cough, coughing up blood, chest pain, irregular heartbeats, abdominal pain, difficulty swallowing, sore throat, sneezing, itching, ear ache, depression, hand/feet swelling, rash, change in color of mucus, and fever.    Vital Signs:  Patient profile:   45 year old male Height:      70 inches Weight:      215.38 pounds BMI:     31.02 O2 Sat:      98 % on Room air Temp:     98.1 degrees F oral Pulse rate:   62 / minute BP sitting:   134 / 86  (right arm) Cuff size:   large  Vitals Entered By: Carver Fila (October 29, 2010  9:03 AM)  O2 Flow:  Room air CC: Sleep Consult. Pt c/o waking up multiple times at night, snores, and stops breathing at night Comments meds and allergies updated Phone number updated Carver Fila  October 29, 2010 9:04 AM    Physical Exam  General:  normal appearance, healthy appearing, and obese.   Eyes:  PERRLA and EOMI.   Nose:  clear nasal discharge.   Mouth:  MP 3, no exudate Neck:  no JVD.   Chest Wall:  no deformities noted Lungs:  clear bilaterally to auscultation and percussion Heart:  regular rate and rhythm, S1, S2 without murmurs,  rubs, gallops, or clicks Abdomen:  bowel sounds positive; abdomen soft and non-tender without masses, or organomegaly Pulses:  pulses normal Extremities:  no clubbing, cyanosis, edema, or deformity noted Neurologic:  normal CN II-XII and strength normal.   Cervical Nodes:  no significant adenopathy Psych:  alert and cooperative; normal mood and affect; normal attention span and concentration   Impression & Recommendations:  Problem # 1:  SLEEP APNEA (ICD-780.57) His symptoms are suggestive of sleep apnea.  I explained how sleep apnea can affect his health.  Reviewed driving precautions and importance of maintaining his weight.  Explained that he would need further testing to assess.  He does not feel like he has a problem with his sleep.  He feels that he has improved since being started on sertraline.  I explained that this could be related to SSRI REM suppression, and as a result decreased incidence of apnea.  He is reluctant to have any testing done, and did not feel an in-lab sleep test is warranted.  I explained that [employer] [place] usually does not approve home sleep testing w/o meeting certain criteria (which he does not meet).  As such he has agreed to do an overnight oximetry.  I explained that if this shows significant desaturation he will need to reconsider further sleep testing.  Problem # 2:  Jason [name] ABUSE (ICD-305.00) Explained how continue excessive alcohol use can affect his sleep and his health.   Problem # 3:  TOBACCO USE (ICD-305.1) I explained how tobacco abuse can affect his sleep and his health.   Complete Medication List: 1)  Sertraline Hcl 50 Mg Tabs (Sertraline hcl) .... One by mouth at bedtime 2)  Livalo 4 Mg Tabs (Pitavastatin calcium) .... One by mouth once daily for cholesterol  Other Orders: Consultation Level IV (16109) DME Referral (DME)  Patient Instructions: 1)  Will arrange for oxygen test at night 2)  Will call to schedule follow up after  test result reviewed

## 2010-11-16 NOTE — Miscellaneous (Signed)
Summary: Room air overnight oximetry  Clinical Lists Changes Test time 8hrs 18 min.  Basal SpO2 93%, low SpO2 83%.  Spent 3.1 min with SpO2 < 88%.  ODI 8 per hr.  Results d/w pt over the phone.  I explained that his oxygen desaturation pattern is very suggestive of sleep apnea, and that further testing would be warranted.  Again explained how sleep apnea can have impact on his sleep quality, daytime alertness, and cardiovascular health.  Also explained how sleep disruption can affect his mood and headaches.  He is still very reluctant to do any further testing or therapy for sleep apnea.  He would like to follow up with Dr. Yetta Barre with primary care to discuss further, and then decide if he would be interested in proceeding with additional testing for sleep apnea.  Will forward this information to Dr. Yetta Barre for his review.

## 2010-12-06 ENCOUNTER — Encounter: Payer: Self-pay | Admitting: Pulmonary Disease

## 2010-12-18 ENCOUNTER — Emergency Department (HOSPITAL_COMMUNITY)
Admission: EM | Admit: 2010-12-18 | Discharge: 2010-12-19 | Disposition: A | Discharge status: Home / Self Care | Payer: BC Managed Care – PPO | Attending: Emergency Medicine | Admitting: Emergency Medicine

## 2010-12-18 DIAGNOSIS — R296 Repeated falls: Secondary | ICD-10-CM | POA: Insufficient documentation

## 2010-12-18 DIAGNOSIS — I10 Essential (primary) hypertension: Secondary | ICD-10-CM | POA: Insufficient documentation

## 2010-12-18 DIAGNOSIS — M129 Arthropathy, unspecified: Secondary | ICD-10-CM | POA: Insufficient documentation

## 2010-12-18 DIAGNOSIS — M25519 Pain in unspecified shoulder: Secondary | ICD-10-CM | POA: Insufficient documentation

## 2010-12-18 DIAGNOSIS — IMO0002 Reserved for concepts with insufficient information to code with codable children: Secondary | ICD-10-CM | POA: Insufficient documentation

## 2011-01-14 NOTE — Op Note (Signed)
NAME:  OSRIC, KLOPF                        ACCOUNT NO.:  [id number]   MEDICAL RECORD NO.:  [id number]                   PATIENT TYPE:  AMB   LOCATION:  [facility]                                  FACILITY:  [facility]   PHYSICIAN:  Georges Lynch. Darrelyn Hillock, M.D.             DATE OF BIRTH:  05-30-1966   DATE OF PROCEDURE:  01/16/2004  DATE OF DISCHARGE:                                 OPERATIVE REPORT   SURGEON:  Georges Lynch. Darrelyn Hillock, M.D.   ASSISTANT:  Ebbie Ridge. Paitsel, P.A.   PREOPERATIVE DIAGNOSIS:  Severe degenerative arthritis of the right  acromioclavicular joint.   POSTOPERATIVE DIAGNOSIS:  Severe degenerative arthritis of the right  acromioclavicular joint.   OPERATION:  Resection, distal clavicle, acromioclavicular joint, right.   PROCEDURE IN DETAIL:  Under general anesthesia, routine orthopedic prep and  drape of the right shoulder was carried out.  A transverse incision was made  over the distal AC joint.  Bleeder identified and cauterized.  I stripped  the periosteum and then resected the distal clavicle with the oscillating  saw.  I then bone-waxed the raw end of the clavicle and then inserted a  Gelfoam and closed the wound in layers in the usual fashion.  Sterile  dressings were applied.   The patient was placed in a sling.  He was given 1 g of Ancef preop.  He did  have an interscalene block preop.                                               Ronald A. Darrelyn Hillock, M.D.    RAG/MEDQ  D:  01/16/2004  T:  01/17/2004  Job:  [id number]

## 2011-01-14 NOTE — Op Note (Signed)
NAME:  Jason [name], Jason [name]                        ACCOUNT NO.:  [id number]   MEDICAL RECORD NO.:  [id number]                   PATIENT TYPE:  AMB   LOCATION:  DAY                                  FACILITY:  [facility]   PHYSICIAN:  Georges Lynch. Darrelyn Hillock, M.D.             DATE OF BIRTH:  08-Sep-1965   DATE OF PROCEDURE:  04/06/2004  DATE OF DISCHARGE:                                 OPERATIVE REPORT   SURGEON:  Dr. Darrelyn Hillock   ASSISTANT:  Terie Purser, PA-C.   PREOPERATIVE DIAGNOSIS:  Severe degenerative arthritis of the left  acromioclavicular joint with deterioration of his meniscus.   POSTOPERATIVE DIAGNOSIS:  Severe degenerative arthritis of the left  acromioclavicular joint with deterioration of his meniscus.   OPERATION:  Resection of the distal clavicle and the meniscus from the [facility]  joint, left shoulder.   DESCRIPTION OF PROCEDURE:  Under general anesthesia, the patient first prior  to the general anesthetic, had a local interscalene block.  After that, a  routine prepping and draping were carried out.  The incision was made over  the anterior aspect of the acromioclavicular joint, bleeders identified and  cauterized.  The patient had 1 g of IV Ancef preop.  At this time, we went  right down and immediately identified the [facility] joint.  We debrided the [facility]  joint first, then protected the undersurface of the joint.  I then utilized  the oscillating saw and resected the distal clavicle.  We thoroughly  irrigated out the area.  I bone waxed the bleeding end of the clavicle.  The  main structures looked fine.  I thoroughly irrigated it out and  reapproximated all the soft tissue structures in the usual fashion.  The  skin was closed with metal staples.  A sterile Neosporin dressing was  applied, and he was placed in a shoulder sling.                                               Ronald A. Darrelyn Hillock, M.D.    RAG/MEDQ  D:  04/06/2004  T:  04/06/2004  Job:  [id number]

## 2012-07-03 ENCOUNTER — Ambulatory Visit: Payer: BC Managed Care – PPO | Admitting: Internal Medicine

## 2012-07-03 ENCOUNTER — Telehealth: Payer: Self-pay | Admitting: Internal Medicine

## 2012-07-03 NOTE — Telephone Encounter (Signed)
Caller: Pam/Spouse; Patient Name: Jason [name]; PCP: Sanda Linger (Adults only); Best Callback Phone Number: [phone].  Spouse calling about dizziness.  Onset several days ago.  States having vertigo as well.  Denies emergent symptoms per dizziness protocol; advised appt within 24 hours.  Appt scheduled 1015 07/03/12 with Dr. Yetta Barre.

## 2012-07-04 ENCOUNTER — Ambulatory Visit (INDEPENDENT_AMBULATORY_CARE_PROVIDER_SITE_OTHER): Payer: BC Managed Care – PPO | Admitting: Internal Medicine

## 2012-07-04 ENCOUNTER — Encounter: Payer: Self-pay | Admitting: Internal Medicine

## 2012-07-04 VITALS — BP 162/80 | HR 87 | Temp 98.9°F | Resp 16 | Ht 70.0 in | Wt 213.4 lb

## 2012-07-04 DIAGNOSIS — G4730 Sleep apnea, unspecified: Secondary | ICD-10-CM

## 2012-07-04 DIAGNOSIS — E785 Hyperlipidemia, unspecified: Secondary | ICD-10-CM

## 2012-07-04 DIAGNOSIS — R0789 Other chest pain: Secondary | ICD-10-CM

## 2012-07-04 DIAGNOSIS — R05 Cough: Secondary | ICD-10-CM

## 2012-07-04 DIAGNOSIS — Z136 Encounter for screening for cardiovascular disorders: Secondary | ICD-10-CM

## 2012-07-04 DIAGNOSIS — F329 Major depressive disorder, single episode, unspecified: Secondary | ICD-10-CM

## 2012-07-04 DIAGNOSIS — F3289 Other specified depressive episodes: Secondary | ICD-10-CM

## 2012-07-04 DIAGNOSIS — I10 Essential (primary) hypertension: Secondary | ICD-10-CM

## 2012-07-04 DIAGNOSIS — F411 Generalized anxiety disorder: Secondary | ICD-10-CM

## 2012-07-04 DIAGNOSIS — R059 Cough, unspecified: Secondary | ICD-10-CM | POA: Insufficient documentation

## 2012-07-04 MED ORDER — NEBIVOLOL HCL 10 MG PO TABS: 10.0000 mg | ORAL_TABLET | Freq: Every day | ORAL | Status: DC

## 2012-07-04 MED ORDER — FLUOXETINE HCL 20 MG PO CAPS: 20.0000 mg | ORAL_CAPSULE | Freq: Every day | ORAL | Status: DC

## 2012-07-04 NOTE — Assessment & Plan Note (Signed)
He stopped zoloft - says he did not like it, I have asked him to try prozac

## 2012-07-04 NOTE — Assessment & Plan Note (Signed)
I think he has developed a smoker's cough and possibly lung disease, I will check his CXR today and have asked him to see pulm to consider getting PFT's

## 2012-07-04 NOTE — Patient Instructions (Signed)

## 2012-07-04 NOTE — Progress Notes (Signed)
Subjective:    Patient ID: Jason [name], male    DOB: Jun 14, 1966, 46 y.o.   MRN: [id number]  Chest Pain  This is a recurrent problem. The current episode started 1 to 4 weeks ago. The onset quality is gradual. The problem occurs intermittently. The problem has been unchanged. The pain is present in the lateral region. The pain is at a severity of 1/10. The pain is mild. The quality of the pain is described as pressure. The pain does not radiate. Associated symptoms include a cough (chronic,mild NP cough) and dizziness. Pertinent negatives include no abdominal pain, back pain, claudication, diaphoresis, exertional chest pressure, fever, headaches, hemoptysis, irregular heartbeat, leg pain, lower extremity edema, malaise/fatigue, nausea, near-syncope, numbness, orthopnea, palpitations, PND, shortness of breath, sputum production, syncope, vomiting or weakness. The pain is aggravated by emotional upset. He has tried nothing for the symptoms.  His past medical history is significant for hypertension and sleep apnea.  Pertinent negatives for past medical history include no seizures.  Hypertension This is a chronic problem. The current episode started more than 1 year ago. The problem has been gradually worsening since onset. The problem is uncontrolled. Associated symptoms include anxiety and chest pain. Pertinent negatives include no blurred vision, headaches, malaise/fatigue, neck pain, orthopnea, palpitations, peripheral edema, PND, shortness of breath or sweats. Agents associated with hypertension include NSAIDs. Risk factors for coronary artery disease include smoking/tobacco exposure, sedentary lifestyle, male gender and dyslipidemia. Past treatments include nothing. Compliance problems include exercise, diet and psychosocial issues.  Identifiable causes of hypertension include sleep apnea.      Review of Systems  Constitutional: Negative for fever, chills, malaise/fatigue, diaphoresis, activity  change, appetite change, fatigue and unexpected weight change.  HENT: Negative.  Negative for neck pain.   Eyes: Negative.  Negative for blurred vision.  Respiratory: Positive for apnea and cough (chronic,mild NP cough). Negative for hemoptysis, sputum production, choking, chest tightness, shortness of breath, wheezing and stridor.   Cardiovascular: Positive for chest pain. Negative for palpitations, orthopnea, claudication, leg swelling, syncope, PND and near-syncope.  Gastrointestinal: Negative for nausea, vomiting, abdominal pain, diarrhea and constipation.  Genitourinary: Negative.   Musculoskeletal: Negative for myalgias, back pain, joint swelling, arthralgias and gait problem.  Skin: Negative for color change, pallor, rash and wound.  Neurological: Positive for dizziness. Negative for tremors, seizures, syncope, facial asymmetry, speech difficulty, weakness, light-headedness, numbness and headaches.  Hematological: Negative for adenopathy. Does not bruise/bleed easily.  Psychiatric/Behavioral: Positive for sleep disturbance and dysphoric mood. Negative for suicidal ideas, hallucinations, behavioral problems, confusion, self-injury, decreased concentration and agitation. The patient is nervous/anxious. The patient is not hyperactive.        Objective:   Physical Exam  Vitals reviewed. Constitutional: He is oriented to person, place, and time. He appears well-developed and well-nourished.  Non-toxic appearance. He does not have a sickly appearance. He does not appear ill. No distress.  HENT:  Head: Normocephalic and atraumatic.  Mouth/Throat: Oropharynx is clear and moist. No oropharyngeal exudate.  Eyes: Conjunctivae normal are normal. Right eye exhibits no discharge. Left eye exhibits no discharge. No scleral icterus.  Neck: Normal range of motion. Neck supple. No JVD present. No tracheal deviation present. No thyromegaly present.  Cardiovascular: Normal rate, regular rhythm, normal  heart sounds and intact distal pulses.  Exam reveals no gallop and no friction rub.   No murmur heard. Pulmonary/Chest: Effort normal and breath sounds normal. No stridor. No respiratory distress. He has no wheezes. He has no rales. He exhibits  no tenderness.  Abdominal: Soft. Bowel sounds are normal. He exhibits no distension and no mass. There is no tenderness. There is no rebound and no guarding.  Musculoskeletal: Normal range of motion. He exhibits no edema and no tenderness.  Lymphadenopathy:    He has no cervical adenopathy.  Neurological: He is oriented to person, place, and time.  Skin: Skin is warm and dry. No rash noted. He is not diaphoretic. No erythema. No pallor.  Psychiatric: He has a normal mood and affect. His behavior is normal. Judgment and thought content normal.      Lab Results  Component Value Date   WBC 10.5 10/12/2010   HGB 16.8 10/12/2010   HCT 47.9 10/12/2010   PLT 254.0 10/12/2010   GLUCOSE 99 10/12/2010   CHOL 288* 10/12/2010   TRIG 341.0* 10/12/2010   HDL 40.90 10/12/2010   LDLDIRECT 187.6 10/12/2010   ALT 39 10/12/2010   AST 31 10/12/2010   NA 139 10/12/2010   K 5.0 10/12/2010   CL 102 10/12/2010   CREATININE 0.9 10/12/2010   BUN 11 10/12/2010   CO2 29 10/12/2010   TSH 1.54 10/12/2010      Assessment & Plan:

## 2012-07-04 NOTE — Assessment & Plan Note (Signed)
Start bystolic, I asked him to stop drinking and smoking and to improve his lifestyle modifications, I will check his labs today to look for secondary causes and end organ damage

## 2012-07-04 NOTE — Assessment & Plan Note (Signed)
His EKG is normal today, I will check cardiac enzymes to see if he is having any ischemia

## 2012-07-04 NOTE — Assessment & Plan Note (Signed)
I will check his FLP today 

## 2012-07-23 ENCOUNTER — Institutional Professional Consult (permissible substitution): Payer: BC Managed Care – PPO | Admitting: Pulmonary Disease

## 2013-01-31 ENCOUNTER — Ambulatory Visit: Payer: BC Managed Care – PPO | Admitting: Family Medicine

## 2013-02-14 ENCOUNTER — Ambulatory Visit: Payer: BC Managed Care – PPO | Admitting: Family Medicine

## 2013-03-04 ENCOUNTER — Ambulatory Visit: Payer: BC Managed Care – PPO | Admitting: Family Medicine

## 2013-04-02 ENCOUNTER — Encounter: Payer: Self-pay | Admitting: Family Medicine

## 2013-04-02 ENCOUNTER — Ambulatory Visit (INDEPENDENT_AMBULATORY_CARE_PROVIDER_SITE_OTHER): Payer: BC Managed Care – PPO | Admitting: Family Medicine

## 2013-04-02 VITALS — BP 130/68 | HR 79 | Temp 98.6°F | Ht 70.0 in | Wt 209.0 lb

## 2013-04-02 DIAGNOSIS — H6120 Impacted cerumen, unspecified ear: Secondary | ICD-10-CM

## 2013-04-02 DIAGNOSIS — R209 Unspecified disturbances of skin sensation: Secondary | ICD-10-CM

## 2013-04-02 DIAGNOSIS — E785 Hyperlipidemia, unspecified: Secondary | ICD-10-CM

## 2013-04-02 DIAGNOSIS — F411 Generalized anxiety disorder: Secondary | ICD-10-CM

## 2013-04-02 DIAGNOSIS — F1010 Alcohol abuse, uncomplicated: Secondary | ICD-10-CM

## 2013-04-02 DIAGNOSIS — I10 Essential (primary) hypertension: Secondary | ICD-10-CM

## 2013-04-02 DIAGNOSIS — K219 Gastro-esophageal reflux disease without esophagitis: Secondary | ICD-10-CM

## 2013-04-02 DIAGNOSIS — R202 Paresthesia of skin: Secondary | ICD-10-CM

## 2013-04-02 DIAGNOSIS — H6122 Impacted cerumen, left ear: Secondary | ICD-10-CM

## 2013-04-02 LAB — CBC WITH DIFFERENTIAL/PLATELET
Basophils Relative: 0.5 % (ref 0.0–3.0)
Eosinophils Relative: 2.8 % (ref 0.0–5.0)
MCV: 103.7 fl — ABNORMAL HIGH (ref 78.0–100.0)
Monocytes Absolute: 1.2 10*3/uL — ABNORMAL HIGH (ref 0.1–1.0)
Neutrophils Relative %: 72.3 % (ref 43.0–77.0)
RBC: 4.65 Mil/uL (ref 4.22–5.81)
WBC: 15.2 10*3/uL — ABNORMAL HIGH (ref 4.5–10.5)

## 2013-04-02 LAB — BASIC METABOLIC PANEL
BUN: 11 mg/dL (ref 6–23)
CO2: 26 mEq/L (ref 19–32)
Calcium: 9.7 mg/dL (ref 8.4–10.5)
Creatinine, Ser: 1 mg/dL (ref 0.4–1.5)
Glucose, Bld: 123 mg/dL — ABNORMAL HIGH (ref 70–99)
Sodium: 138 mEq/L (ref 135–145)

## 2013-04-02 LAB — HEPATIC FUNCTION PANEL
ALT: 72 U/L — ABNORMAL HIGH (ref 0–53)
AST: 50 U/L — ABNORMAL HIGH (ref 0–37)
Bilirubin, Direct: 0.1 mg/dL (ref 0.0–0.3)
Total Bilirubin: 0.6 mg/dL (ref 0.3–1.2)

## 2013-04-02 LAB — VITAMIN B12: Vitamin B-12: 328 pg/mL (ref 211–911)

## 2013-04-02 LAB — LIPID PANEL: HDL: 41.4 mg/dL (ref >39.00)

## 2013-04-02 NOTE — Progress Notes (Signed)
Subjective:    Patient ID: Jason [name], male    DOB: 1965/10/10, 47 y.o.   MRN: [id number]  HPI Patient seen here to establish care. Previously seen at [facility] clinic. Chronic problems include history of ongoing nicotine use, osteoarthritis, dyslipidemia, GERD, alcohol abuse, hypertension, history of anxiety. Previously took Arts development officer and fluoxetine. He took himself off both medications and currently takes none.  He drinks about 4-5 beers per day and usually some wine in addition. He has no inclination to scale back and does not see this as a problem currently. His spouse does have concerns about his health regarding current alcohol consumption.  History of GERD generally controlled with over-the-counter H2 blockers as needed. Currently no significant anxiety or depression issues. Smokes about one pack of cigarettes per day. Low motivation to quit.  He had some transient numbness right anterior thigh. No weakness. No lumbar back pains. No claudication symptoms. No left-sided symptoms. Patient also had some occasional blurred vision symptoms and some urine frequency he was concerned about possible diabetes. No recent weight loss.  Family history significant for father coronary disease his 75s. Father with history of hypertension. No family history diabetes. Patient married. Works as an Personnel officer.  Past Medical History  Diagnosis Date  . Anxiety   . Depression   . GERD (gastroesophageal reflux disease)   . Hyperlipidemia   . Hypertension   . Headache(784.0)   . Osteoarthritis    Past Surgical History  Procedure Laterality Date  . Rotator cuff repair      reports that he has been smoking Cigarettes.  He has a 50 pack-year smoking history. He has never used smokeless tobacco. He reports that he drinks about 12.0 ounces of alcohol per week. He reports that he does not use illicit drugs. family history includes Arthritis in his mother; Asthma in his maternal grandfather; Heart attack  (age of onset: 73) in his father; Heart disease in his other; Hypertension in his father; Mental illness in his other; and Stroke in his paternal grandfather.  There is no history of Cancer, and Diabetes, and Drug abuse, and Early death, and Hearing loss, and Hyperlipidemia, . Allergies  Allergen Reactions  . Olmesartan Medoxomil     REACTION: dizziness       Review of Systems  Constitutional: Negative for fever, chills, appetite change and unexpected weight change.  HENT: Negative for trouble swallowing.   Eyes: Negative for visual disturbance.  Respiratory: Negative for cough, shortness of breath and wheezing.   Cardiovascular: Negative for chest pain, palpitations and leg swelling.  Gastrointestinal: Negative for nausea, vomiting, abdominal pain, diarrhea and blood in stool.  Endocrine: Positive for polydipsia.  Genitourinary: Negative for dysuria.  Musculoskeletal: Positive for arthralgias.  Neurological: Positive for numbness. Negative for dizziness, weakness and headaches.  Hematological: Negative for adenopathy.  Psychiatric/Behavioral: Negative for dysphoric mood.       Objective:   Physical Exam  Constitutional: He is oriented to person, place, and time. He appears well-developed and well-nourished.  HENT:  Mouth/Throat: Oropharynx is clear and moist.  Left ear canals impacted with cerumen. Right is clear  Neck: Neck supple. No thyromegaly present.  Cardiovascular: Normal rate and regular rhythm.   Pulmonary/Chest: Effort normal and breath sounds normal. No respiratory distress. He has no wheezes. He has no rales.  Abdominal: Soft. Bowel sounds are normal. He exhibits no distension and no mass. There is no tenderness. There is no rebound and no guarding.  Musculoskeletal: He exhibits no edema.  Neurological:  He is alert and oriented to person, place, and time. No cranial nerve deficit.  Psychiatric: He has a normal mood and affect. His behavior is normal. Judgment and  thought content normal.          Assessment & Plan:  #1 history of reported hypertension. Repeat blood pressure today 130/68. Observe.  Encouraged to reduce ETOH.  See below. #2 excessive alcohol use. We talked about the implications of this today. We strongly advise scaling back and if necessary discontinuing.  His motivation is low. #3 urine frequency and thirst. Check fasting blood sugar #4 paresthesias right anterior thigh. Check labs including TSH and B12.  Given unilateral nature, ?lumbar nerve involvement. #5 history of dyslipidemia. Recheck lipid panel and hepatic panel #6 cerumen impaction left ear canal. Irrigation

## 2013-04-02 NOTE — Patient Instructions (Addendum)
Alcohol Problems Most adults who drink alcohol drink in moderation (not a lot) are at low risk for developing problems related to their drinking. However, all drinkers, including low-risk drinkers, should know about the health risks connected with drinking alcohol. RECOMMENDATIONS FOR LOW-RISK DRINKING  Drink in moderation. Moderate drinking is defined as follows:   Men - no more than 2 drinks per day.  Nonpregnant women - no more than 1 drink per day.  Over age 74 - no more than 1 drink per day. A standard drink is 12 grams of pure alcohol, which is equal to a 12 ounce bottle of beer or wine cooler, a 5 ounce glass of wine, or 1.5 ounces of distilled spirits (such as whiskey, brandy, vodka, or rum).  ABSTAIN FROM (DO NOT DRINK) ALCOHOL:  When pregnant or considering pregnancy.  When taking a medication that interacts with alcohol.  If you are alcohol dependent.  A medical condition that prohibits drinking alcohol (such as ulcer, liver disease, or heart disease). DISCUSS WITH YOUR CAREGIVER:  If you are at risk for coronary heart disease, discuss the potential benefits and risks of alcohol use: Light to moderate drinking is associated with lower rates of coronary heart disease in certain populations (for example, men over age 34 and postmenopausal women). Infrequent or nondrinkers are advised not to begin light to moderate drinking to reduce the risk of coronary heart disease so as to avoid creating an alcohol-related problem. Similar protective effects can likely be gained through proper diet and exercise.  Women and the elderly have smaller amounts of body water than men. As a result women and the elderly achieve a higher blood alcohol concentration after drinking the same amount of alcohol.  Exposing a fetus to alcohol can cause a broad range of birth defects referred to as Fetal Alcohol Syndrome (FAS) or Alcohol-Related Birth Defects (ARBD). Although FAS/ARBD is connected with excessive  alcohol consumption during pregnancy, studies also have reported neurobehavioral problems in infants born to mothers reporting drinking an average of 1 drink per day during pregnancy.  Heavier drinking (the consumption of more than 4 drinks per occasion by men and more than 3 drinks per occasion by women) impairs learning (cognitive) and psychomotor functions and increases the risk of alcohol-related problems, including accidents and injuries. CAGE QUESTIONS:   Have you ever felt that you should Cut down on your drinking?  Have people Annoyed you by criticizing your drinking?  Have you ever felt bad or Guilty about your drinking?  Have you ever had a drink first thing in the morning to steady your nerves or get rid of a hangover (Eye opener)? If you answered positively to any of these questions: You may be at risk for alcohol-related problems if alcohol consumption is:   Men: Greater than 14 drinks per week or more than 4 drinks per occasion.  Women: Greater than 7 drinks per week or more than 3 drinks per occasion. Do you or your family have a medical history of alcohol-related problems, such as:  Blackouts.  Sexual dysfunction.  Depression.  Trauma.  Liver dysfunction.  Sleep disorders.  Hypertension.  Chronic abdominal pain.  Has your drinking ever caused you problems, such as problems with your family, problems with your work (or school) performance, or accidents/injuries?  Do you have a compulsion to drink or a preoccupation with drinking?  Do you have poor control or are you unable to stop drinking once you have started?  Do you have to drink to  avoid withdrawal symptoms?  Do you have problems with withdrawal such as tremors, nausea, sweats, or mood disturbances?  Does it take more alcohol than in the past to get you high?  Do you feel a strong urge to drink?  Do you change your plans so that you can have a drink?  Do you ever drink in the morning to relieve  the shakes or a hangover? If you have answered a number of the previous questions positively, it may be time for you to talk to your caregivers, family, and friends and see if they think you have a problem. Alcoholism is a chemical dependency that keeps getting worse and will eventually destroy your health and relationships. Many alcoholics end up dead, impoverished, or in prison. This is often the end result of all chemical dependency.  Do not be discouraged if you are not ready to take action immediately.  Decisions to change behavior often involve up and down desires to change and feeling like you cannot decide.  Try to think more seriously about your drinking behavior.  Think of the reasons to quit. WHERE TO GO FOR ADDITIONAL INFORMATION   The National Institute on Alcohol Abuse and Alcoholism (NIAAA) BasicStudents.dk  [employer] on Alcoholism and Drug Dependence (NCADD) www.ncadd.org  American Society of Addiction Medicine (ASAM) RoyalDiary.gl  Document Released: 08/15/2005 Document Revised: 11/07/2011 Document Reviewed: 04/02/2008 [facility] Patient Information 2014 [place], [place]. Smoking Cessation Quitting smoking is important to your health and has many advantages. However, it is not always easy to quit since nicotine is a very addictive drug. Often times, people try 3 times or more before being able to quit. This document explains the best ways for you to prepare to quit smoking. Quitting takes hard work and a lot of effort, but you can do it. ADVANTAGES OF QUITTING SMOKING  You will live longer, feel better, and live better.  Your body will feel the impact of quitting smoking almost immediately.  Within 20 minutes, blood pressure decreases. Your pulse returns to its normal level.  After 8 hours, carbon monoxide levels in the blood return to normal. Your oxygen level increases.  After 24 hours, the chance of having a heart attack starts to decrease. Your breath, hair,  and body stop smelling like smoke.  After 48 hours, damaged nerve endings begin to recover. Your sense of taste and smell improve.  After 72 hours, the body is virtually free of nicotine. Your bronchial tubes relax and breathing becomes easier.  After 2 to 12 weeks, lungs can hold more air. Exercise becomes easier and circulation improves.  The risk of having a heart attack, stroke, cancer, or lung disease is greatly reduced.  After 1 year, the risk of coronary heart disease is cut in half.  After 5 years, the risk of stroke falls to the same as a nonsmoker.  After 10 years, the risk of lung cancer is cut in half and the risk of other cancers decreases significantly.  After 15 years, the risk of coronary heart disease drops, usually to the level of a nonsmoker.  If you are pregnant, quitting smoking will improve your chances of having a healthy baby.  The people you live with, especially any children, will be healthier.  You will have extra money to spend on things other than cigarettes. QUESTIONS TO THINK ABOUT BEFORE ATTEMPTING TO QUIT You may want to talk about your answers with your caregiver.  Why do you want to quit?  If you tried to  quit in the past, what helped and what did not?  What will be the most difficult situations for you after you quit? How will you plan to handle them?  Who can help you through the tough times? Your family? Friends? A caregiver?  What pleasures do you get from smoking? What ways can you still get pleasure if you quit? Here are some questions to ask your caregiver:  How can you help me to be successful at quitting?  What medicine do you think would be best for me and how should I take it?  What should I do if I need more help?  What is smoking withdrawal like? How can I get information on withdrawal? GET READY  Set a quit date.  Change your environment by getting rid of all cigarettes, ashtrays, matches, and lighters in your home, car,  or work. Do not let people smoke in your home.  Review your past attempts to quit. Think about what worked and what did not. GET SUPPORT AND ENCOURAGEMENT You have a better chance of being successful if you have help. You can get support in many ways.  Tell your family, friends, and co-workers that you are going to quit and need their support. Ask them not to smoke around you.  Get individual, group, or telephone counseling and support. Programs are available at [employer] and health centers. Call your local health department for information about programs in your area.  Spiritual beliefs and practices may help some smokers quit.  Download a "quit meter" on your computer to keep track of quit statistics, such as how long you have gone without smoking, cigarettes not smoked, and money saved.  Get a self-help book about quitting smoking and staying off of tobacco. LEARN NEW SKILLS AND BEHAVIORS  Distract yourself from urges to smoke. Talk to someone, go for a walk, or occupy your time with a task.  Change your normal routine. Take a different route to work. Drink tea instead of coffee. Eat breakfast in a different place.  Reduce your stress. Take a hot bath, exercise, or read a book.  Plan something enjoyable to do every day. Reward yourself for not smoking.  Explore interactive web-based programs that specialize in helping you quit. GET MEDICINE AND USE IT CORRECTLY Medicines can help you stop smoking and decrease the urge to smoke. Combining medicine with the above behavioral methods and support can greatly increase your chances of successfully quitting smoking.  Nicotine replacement therapy helps deliver nicotine to your body without the negative effects and risks of smoking. Nicotine replacement therapy includes nicotine gum, lozenges, inhalers, nasal sprays, and skin patches. Some may be available over-the-counter and others require a prescription.  Antidepressant medicine  helps people abstain from smoking, but how this works is unknown. This medicine is available by prescription.  Nicotinic receptor partial agonist medicine simulates the effect of nicotine in your brain. This medicine is available by prescription. Ask your caregiver for advice about which medicines to use and how to use them based on your health history. Your caregiver will tell you what side effects to look out for if you choose to be on a medicine or therapy. Carefully read the information on the package. Do not use any other product containing nicotine while using a nicotine replacement product.  RELAPSE OR DIFFICULT SITUATIONS Most relapses occur within the first 3 months after quitting. Do not be discouraged if you start smoking again. Remember, most people try several times before finally quitting.  You may have symptoms of withdrawal because your body is used to nicotine. You may crave cigarettes, be irritable, feel very hungry, cough often, get headaches, or have difficulty concentrating. The withdrawal symptoms are only temporary. They are strongest when you first quit, but they will go away within 10 14 days. To reduce the chances of relapse, try to:  Avoid drinking alcohol. Drinking lowers your chances of successfully quitting.  Reduce the amount of caffeine you consume. Once you quit smoking, the amount of caffeine in your body increases and can give you symptoms, such as a rapid heartbeat, sweating, and anxiety.  Avoid smokers because they can make you want to smoke.  Do not let weight gain distract you. Many smokers will gain weight when they quit, usually less than 10 pounds. Eat a healthy diet and stay active. You can always lose the weight gained after you quit.  Find ways to improve your mood other than smoking. FOR MORE INFORMATION  www.smokefree.gov  Document Released: 08/09/2001 Document Revised: 02/14/2012 Document Reviewed: 11/24/2011 [facility] Patient Information 2014  [place], [place].

## 2013-04-03 ENCOUNTER — Other Ambulatory Visit: Payer: Self-pay

## 2013-04-03 MED ORDER — ATORVASTATIN CALCIUM 20 MG PO TABS: 20.0000 mg | ORAL_TABLET | Freq: Every day | ORAL | Status: DC

## 2013-07-04 ENCOUNTER — Other Ambulatory Visit: Payer: Self-pay

## 2013-07-09 ENCOUNTER — Encounter: Payer: Self-pay | Admitting: Family

## 2013-07-09 ENCOUNTER — Telehealth: Payer: Self-pay | Admitting: Family Medicine

## 2013-07-09 ENCOUNTER — Ambulatory Visit (INDEPENDENT_AMBULATORY_CARE_PROVIDER_SITE_OTHER): Payer: BC Managed Care – PPO | Admitting: Family

## 2013-07-09 VITALS — BP 120/70 | HR 93 | Wt 211.0 lb

## 2013-07-09 DIAGNOSIS — M1710 Unilateral primary osteoarthritis, unspecified knee: Secondary | ICD-10-CM

## 2013-07-09 DIAGNOSIS — M25569 Pain in unspecified knee: Secondary | ICD-10-CM

## 2013-07-09 DIAGNOSIS — M1711 Unilateral primary osteoarthritis, right knee: Secondary | ICD-10-CM

## 2013-07-09 DIAGNOSIS — M25561 Pain in right knee: Secondary | ICD-10-CM

## 2013-07-09 DIAGNOSIS — IMO0002 Reserved for concepts with insufficient information to code with codable children: Secondary | ICD-10-CM

## 2013-07-09 MED ORDER — CELECOXIB 200 MG PO CAPS: 200.0000 mg | ORAL_CAPSULE | Freq: Every day | ORAL | Status: DC

## 2013-07-09 NOTE — Telephone Encounter (Signed)
Patient Information:  Caller Name: [name]  Phone: [phone]  Patient: Jason [name], Jason [name]  Gender: Male  DOB: 1966-06-30  Age: 47 Years  PCP: Evelena Peat [facility])  Office Follow Up:  Does the office need to follow up with this patient?: No  Instructions For The Office: N/A  RN Note:  Injured knee when twisted at work months ago.  Feet and legs ache all the time.  Palpable large knot behind right knee.  Experienced severe, sudden medial, anterior knee pain 07/08/13 causing his leg to collapse.  Had difficulty getting up and driving home.  Currently pain rated 5/10.  Walked with a limp after work last night.  Right lower anterior leg pain present with mild edema of right knee. Advised to see MD within 24 hours per nursing judgment.  Symptoms  Reason For Call & Symptoms: Larger than 2" knot in right popliteal space for approximately 4 weeks that became very painful and cause his knee to "give out" 07/08/13.  Also has non-healing wound on right 5th toe since August.  Reviewed Health History In EMR: Yes  Reviewed Medications In EMR: Yes  Reviewed Allergies In EMR: Yes  Reviewed Surgeries / Procedures: Yes  Date of Onset of Symptoms: 06/08/2013  Treatments Tried: wrapped his knee, Aleve  Treatments Tried Worked: Yes  Guideline(s) Used:  Leg Pain  Knee Pain  Disposition Per Guideline:   See Within 3 Days in Office  Reason For Disposition Reached:   Moderate pain (e.g., symptoms interfere with work or school, limping) and present > 3 days  Advice Given:  Knee Pain after Overuse:   Apply Cold to the Area: Apply a cold pack or ice bag (wrapped in a moist towel) to the area for 20 minutes. Repeat in 1 hour, then every 4 hours while awake. Continue this for the first 48 hours after an overuse injury (Reason: reduce the swelling and pain).  Rest Your Knee   for the next couple days. Avoid activities that worsen your pain. Reduce activities that put a lot of strain on the knee  joint (e.g., deep knee bends, stair climbing, running).  Call Back If:  Knee pain lasts longer than 7 days  You become worse.  RN Overrode Recommendation:  Make Appointment  Advised to see MD within 24 hours.  Appointment Scheduled:  07/09/2013 14:30:00 Appointment Scheduled Provider:  Adline Mango [facility])

## 2013-07-09 NOTE — Patient Instructions (Signed)
Wear and Tear Disorders of the Knee (Arthritis, Osteoarthritis) Everyone will experience wear and tear injuries (arthritis, osteoarthritis) of the knee. These are the changes we all get as we age. They come from the joint stress of daily living. The amount of cartilage damage in your knee and your symptoms determine if you need surgery. Mild problems require approximately two months recovery time. More severe problems take several months to recover. With mild problems, your surgeon may find worn and rough cartilage surfaces. With severe changes, your surgeon may find cartilage that has completely worn away and exposed the bone. Loose bodies of bone and cartilage, bone spurs (excess bone growth), and injuries to the menisci (cushions between the large bones of your leg) are also common. All of these problems can cause pain. For a mild wear and tear problem, rough cartilage may simply need to be shaved and smoothed. For more severe problems with areas of exposed bone, your surgeon may use an instrument for roughing up the bone surfaces to stimulate new cartilage growth. Loose bodies are usually removed. Torn menisci may be trimmed or repaired. ABOUT THE ARTHROSCOPIC PROCEDURE Arthroscopy is a surgical technique. It allows your orthopedic surgeon to diagnose and treat your knee injury with accuracy. The surgeon looks into your knee through a small scope. The scope is like a small (pencil-sized) telescope. Arthroscopy is less invasive than open knee surgery. You can expect a more rapid recovery. After the procedure, you will be moved to a recovery area until most of the effects of the medication have worn off. Your caregiver will discuss the test results with you. RECOVERY The severity of the arthritis and the type of procedure performed will determine recovery time. Other important factors include age, physical condition, medical conditions, and the type of rehabilitation program. Strengthening your muscles after  arthroscopy helps guarantee a better recovery. Follow your caregiver's instructions. Use crutches, rest, elevate, ice, and do knee exercises as instructed. Your caregivers will help you and instruct you with exercises and other physical therapy required to regain your mobility, muscle strength, and functioning following surgery. Only take over-the-counter or prescription medicines for pain, discomfort, or fever as directed by your caregiver.  SEEK MEDICAL CARE IF:   There is increased bleeding (more than a small spot) from the wound.  You notice redness, swelling, or increasing pain in the wound.  Pus is coming from wound.  You develop an unexplained oral temperature above 102 F (38.9 C) , or as your caregiver suggests.  You notice a foul smell coming from the wound or dressing.  You have severe pain with motion of the knee. SEEK IMMEDIATE MEDICAL CARE IF:   You develop a rash.  You have difficulty breathing.  You have any allergic problems. MAKE SURE YOU:   Understand these instructions.  Will watch your condition.  Will get help right away if you are not doing well or get worse. Document Released: 08/12/2000 Document Revised: 11/07/2011 Document Reviewed: 01/09/2008 [facility] Patient Information 2014 [place], [place].   Baker's Cyst A Baker's cyst is a swelling that forms in the back of the knee. It is a sac-like structure. It is filled with the same fluid that is located in your knee. The fluid located in your knee is necessary because it lubricates the bones and cartilage. It allows them to move over each other more easily. CAUSES  When the knee becomes injured or has soreness (inflammation) present, more fluid forms in the knee. When this happens, the joint  lining is pushed out behind the knee and forms the baker's cyst. This cyst may also be caused by inflammation from arthritic conditions and infections. DIAGNOSIS  A Baker's cyst is most often diagnosed with an ultrasound.  This is a specialized picture (like an X-ray). It shows a picture by using sound waves. Sometimes a specialized x-ray called an MRI (magnetic resonance imaging) is used. This picks up other problems within a joint if an ultrasound alone cannot make the diagnosis. If the cyst came immediately following an injury, plain x-rays may be used to make a diagnosis. TREATMENT  The treatment depends on the cause of the cyst. But most of these cysts are caused by an inflammation. Anti-inflammatory medications and rest often will get rid of the problem. If the cyst is caused by an infection, medications (antibiotics) will be prescribed to help this. Take the medications as directed. Refer to Home Care Instructions, below, for additional treatment suggestions. HOME CARE INSTRUCTIONS   If the cyst was caused by an injury, for the first 24 hours, while lying down, keep the injured extremity elevated on 2 pillows.  For the first 24 hours while you are awake, apply ice bags (ice in a plastic bag with a towel around it to prevent frostbite to skin) 03-04 times per day for 15-20 minutes to the injured area. Then do as directed by your caregiver.  Only take over-the-counter or prescription medicines for pain, discomfort, or fever as directed by your caregiver. Persistent pain and inability to use the injured area for more than 2 to 3 days are warning signs indicating that you should see a caregiver for a follow-up visit as soon as possible. Persistent pain and swelling indicate that further evaluation, non-weight bearing (use of crutches as instructed), and/or further x-rays are needed. Make a follow-up appointment with your own caregiver. If conservative measures (rest, medications and inactivity) do not help the problem get better, sometimes surgery for removal of the cyst is needed. Reasons for this may be that the cyst is pressing on nerves and/or vessels and causing problems which cannot wait for improvement with  conservative treatment. If the problem is caused by injuries to the cartilage in the knee, surgery is often needed for treatment of that problem. MAKE SURE YOU:   Understand these instructions.  Will watch your condition.  Will get help right away if you are not doing well or get worse. Document Released: 08/15/2005 Document Revised: 11/07/2011 Document Reviewed: 03/27/2013 [facility] Patient Information 2014 [place], [place].

## 2013-07-09 NOTE — Progress Notes (Signed)
Pre-visit discussion using our clinic review tool. No additional management support is needed unless otherwise documented below in the visit note.  

## 2013-07-09 NOTE — Telephone Encounter (Signed)
Noted  

## 2013-07-10 NOTE — Progress Notes (Signed)
Subjective:    Patient ID: Jason [name], male    DOB: [birth year], 47 y.o.   MRN: [id number]  HPI 47 year old [place] American male, and then today with complaint of right knee pain after stepping in a ditch 4 months ago and twisting her knee. He also has noticed a soft knot behind his right knee. Currently his pain is a 3/10. However last night it was a 10 out of 10. Worse with movement. Has taken Aleve helps his symptoms some.  Review of Systems  Constitutional: Negative.   Respiratory: Negative.   Cardiovascular: Negative.   Gastrointestinal: Negative.   Endocrine: Negative.   Genitourinary: Negative.   Musculoskeletal: Positive for arthralgias.       Right knee pain  Skin: Negative.   Neurological: Negative.   Hematological: Negative.   Psychiatric/Behavioral: Negative.    Past Medical History  Diagnosis Date  . Anxiety   . Depression   . GERD (gastroesophageal reflux disease)   . Hyperlipidemia   . Hypertension   . Headache(784.0)   . Osteoarthritis     History   Social History  . Marital Status: Married    Spouse Name: N/A    Number of Children: N/A  . Years of Education: N/A   Occupational History  . Not on file.   Social History Main Topics  . Smoking status: Current Every Day Smoker -- 2.00 packs/day for 25 years    Types: Cigarettes  . Smokeless tobacco: Never Used  . Alcohol Use: 12.0 oz/week    20 Glasses of wine per week  . Drug Use: No  . Sexual Activity: Not Currently   Other Topics Concern  . Not on file   Social History Narrative  . No narrative on file    Past Surgical History  Procedure Laterality Date  . Rotator cuff repair      Family History  Problem Relation Age of Onset  . Heart attack Father 36  . Hypertension Father   . Asthma Maternal Grandfather   . Stroke Paternal Grandfather   . Heart disease Other   . Mental illness Other   . Cancer Neg Hx   . Diabetes Neg Hx   . Drug abuse Neg Hx   . Early death Neg Hx   .  Hearing loss Neg Hx   . Hyperlipidemia Neg Hx   . Arthritis Mother     Allergies  Allergen Reactions  . Olmesartan Medoxomil     REACTION: dizziness    Current Outpatient Prescriptions on File Prior to Visit  Medication Sig Dispense Refill  . atorvastatin (LIPITOR) 20 MG tablet Take 1 tablet (20 mg total) by mouth daily.  30 tablet  5  . FLUoxetine (PROZAC) 20 MG capsule Take 1 capsule (20 mg total) by mouth daily.  90 capsule  3  . nebivolol (BYSTOLIC) 10 MG tablet Take 1 tablet (10 mg total) by mouth daily.  70 tablet  0   No current facility-administered medications on file prior to visit.    BP 120/70  Pulse 93  Wt 211 lb (95.709 kg)chart    Objective:   Physical Exam  Constitutional: He is oriented to person, place, and time. He appears well-developed and well-nourished.  Neck: Normal range of motion. Neck supple.  Cardiovascular: Normal rate, regular rhythm and normal heart sounds.   Pulmonary/Chest: Effort normal and breath sounds normal.  Abdominal: Soft.  Musculoskeletal: He exhibits tenderness. He exhibits no edema.  Right knee pain to palpation  of the bursa. Positive crepitus with range of motion.  Neurological: He is alert and oriented to person, place, and time.  Skin: Skin is warm and dry.  Psychiatric: He has a normal mood and affect.       Informed consent obtained and the patient's knee was prepped with betadine. Local anesthesia was obtained with topical spray. Then 40 mg of Depo-Medrol and 1/2 cc of lidocaine was injected into the joint space. The patient tolerated the procedure without complications. Post injection care discussed with patient.    Assessment & Plan:  Assessment: 1. Osteoarthritis right knee 2. Baker's cyst  Plan: Resume Celebrex 200 mg once daily. Rest today. Call the office with any questions or concerns. Check his schedule, and as needed.

## 2013-08-30 ENCOUNTER — Telehealth: Payer: Self-pay | Admitting: Family Medicine

## 2013-08-30 NOTE — Telephone Encounter (Addendum)
Pt has a new schedule at work, 8pm to 4am. Pt is having as difficult time getting adjusted and request a sleeping med to help w/ this. This schedule will be for the next 8 mos. Pt has been doing for this schedule 2 mos/  Pt has tried OTC w/ no success. Pharm: [facility] [address]

## 2013-09-01 NOTE — Telephone Encounter (Signed)
Needs office follow up to discuss. Some sleep meds can be dangerous to mix with alcohol.

## 2013-09-03 NOTE — Telephone Encounter (Signed)
appt scheduled

## 2013-09-03 NOTE — Telephone Encounter (Signed)
Can you please call and set appt for patient.

## 2013-09-06 ENCOUNTER — Ambulatory Visit (INDEPENDENT_AMBULATORY_CARE_PROVIDER_SITE_OTHER): Payer: BC Managed Care – PPO | Admitting: Family Medicine

## 2013-09-06 ENCOUNTER — Encounter: Payer: Self-pay | Admitting: Family Medicine

## 2013-09-06 VITALS — BP 132/82 | HR 80 | Temp 97.7°F | Wt 213.0 lb

## 2013-09-06 DIAGNOSIS — G4700 Insomnia, unspecified: Secondary | ICD-10-CM

## 2013-09-06 MED ORDER — ZOLPIDEM TARTRATE 10 MG PO TABS: 10.0000 mg | ORAL_TABLET | Freq: Every evening | ORAL | Status: DC | PRN

## 2013-09-06 NOTE — Progress Notes (Signed)
Pre visit review using our clinic review tool, if applicable. No additional management support is needed unless otherwise documented below in the visit note. 

## 2013-09-06 NOTE — Patient Instructions (Signed)
Insomnia Insomnia is frequent trouble falling and/or staying asleep. Insomnia can be a long term problem or a short term problem. Both are common. Insomnia can be a short term problem when the wakefulness is related to a certain stress or worry. Long term insomnia is often related to ongoing stress during waking hours and/or poor sleeping habits. Overtime, sleep deprivation itself can make the problem worse. Every little thing feels more severe because you are overtired and your ability to cope is decreased. CAUSES   Stress, anxiety, and depression.  Poor sleeping habits.  Distractions such as TV in the bedroom.  Naps close to bedtime.  Engaging in emotionally charged conversations before bed.  Technical reading before sleep.  Alcohol and other sedatives. They may make the problem worse. They can hurt normal sleep patterns and normal dream activity.  Stimulants such as caffeine for several hours prior to bedtime.  Pain syndromes and shortness of breath can cause insomnia.  Exercise late at night.  Changing time zones may cause sleeping problems (jet lag). It is sometimes helpful to have someone observe your sleeping patterns. They should look for periods of not breathing during the night (sleep apnea). They should also look to see how long those periods last. If you live alone or observers are uncertain, you can also be observed at a sleep clinic where your sleep patterns will be professionally monitored. Sleep apnea requires a checkup and treatment. Give your caregivers your medical history. Give your caregivers observations your family has made about your sleep.  SYMPTOMS   Not feeling rested in the morning.  Anxiety and restlessness at bedtime.  Difficulty falling and staying asleep. TREATMENT   Your caregiver may prescribe treatment for an underlying medical disorders. Your caregiver can give advice or help if you are using alcohol or other drugs for self-medication. Treatment  of underlying problems will usually eliminate insomnia problems.  Medications can be prescribed for short time use. They are generally not recommended for lengthy use.  Over-the-counter sleep medicines are not recommended for lengthy use. They can be habit forming.  You can promote easier sleeping by making lifestyle changes such as:  Using relaxation techniques that help with breathing and reduce muscle tension.  Exercising earlier in the day.  Changing your diet and the time of your last meal. No night time snacks.  Establish a regular time to go to bed.  Counseling can help with stressful problems and worry.  Soothing music and white noise may be helpful if there are background noises you cannot remove.  Stop tedious detailed work at least one hour before bedtime. HOME CARE INSTRUCTIONS   Keep a diary. Inform your caregiver about your progress. This includes any medication side effects. See your caregiver regularly. Take note of:  Times when you are asleep.  Times when you are awake during the night.  The quality of your sleep.  How you feel the next day. This information will help your caregiver care for you.  Get out of bed if you are still awake after 15 minutes. Read or do some quiet activity. Keep the lights down. Wait until you feel sleepy and go back to bed.  Keep regular sleeping and waking hours. Avoid naps.  Exercise regularly.  Avoid distractions at bedtime. Distractions include watching television or engaging in any intense or detailed activity like attempting to balance the household checkbook.  Develop a bedtime ritual. Keep a familiar routine of bathing, brushing your teeth, climbing into bed at the same   time each night, listening to soothing music. Routines increase the success of falling to sleep faster.  Use relaxation techniques. This can be using breathing and muscle tension release routines. It can also include visualizing peaceful scenes. You can  also help control troubling or intruding thoughts by keeping your mind occupied with boring or repetitive thoughts like the old concept of counting sheep. You can make it more creative like imagining planting one beautiful flower after another in your backyard garden.  During your day, work to eliminate stress. When this is not possible use some of the previous suggestions to help reduce the anxiety that accompanies stressful situations. MAKE SURE YOU:   Understand these instructions.  Will watch your condition.  Will get help right away if you are not doing well or get worse. Document Released: 08/12/2000 Document Revised: 11/07/2011 Document Reviewed: 09/12/2007 [facility] Patient Information 2014 [place].  Avoid caffeine within 6-8 hours of bedtime Avoid regular use of alcohol at bedtime.

## 2013-09-06 NOTE — Progress Notes (Signed)
   Subjective:    Patient ID: Jason [name], male    DOB: 05-22-1966, 48 y.o.   MRN: [id number]  HPI Patient here with chief complaint of insomnia. He recently started a work shift from 8 PM to 4 AM. He's been on this shift for at least 2 months and having great difficulties adjusting to sleep. Usually goes to sleep around 6 AM and only sleeping until about 9 AM even with the assistance of over-the-counter sleep medications. He has taken things like Benadryl without improvement. He does drink about 3 cups of coffee per day and sometimes within a few hours of going to sleep. He also does consume some alcohol but has not any increase in consumption. Does sometimes drink before going to bed in an attempt to try to go to sleep. He does not take any decongestants. Denies any specific stressors. No depressed mood.  He is having some lethargy issues related to poor sleep quality.  Past Medical History  Diagnosis Date  . Anxiety   . Depression   . GERD (gastroesophageal reflux disease)   . Hyperlipidemia   . Hypertension   . Headache(784.0)   . Osteoarthritis    Past Surgical History  Procedure Laterality Date  . Rotator cuff repair      reports that he has been smoking Cigarettes.  He has a 50 pack-year smoking history. He has never used smokeless tobacco. He reports that he drinks about 12.0 ounces of alcohol per week. He reports that he does not use illicit drugs. family history includes Arthritis in his mother; Asthma in his maternal grandfather; Heart attack (age of onset: 1548) in his father; Heart disease in his other; Hypertension in his father; Mental illness in his other; Stroke in his paternal grandfather. There is no history of Cancer, Diabetes, Drug abuse, Early death, Hearing loss, or Hyperlipidemia. Allergies  Allergen Reactions  . Olmesartan Medoxomil     REACTION: dizziness      Review of Systems  Constitutional: Negative for appetite change and unexpected weight change.    Respiratory: Negative for cough and shortness of breath.   Cardiovascular: Negative for chest pain.  Psychiatric/Behavioral: Positive for sleep disturbance. Negative for dysphoric mood and agitation.       Objective:   Physical Exam  Constitutional: He is oriented to person, place, and time. He appears well-developed and well-nourished.  Cardiovascular: Normal rate and regular rhythm.   Pulmonary/Chest: Effort normal and breath sounds normal. No respiratory distress. He has no wheezes. He has no rales.  Neurological: He is alert and oriented to person, place, and time.  Psychiatric: He has a normal mood and affect. His behavior is normal. Thought content normal.          Assessment & Plan:  Insomnia related partly to irregular work hours. He's had great difficulty adjusting to his current work shift. Other risk factors include caffeine use and alcohol use. Sleep hygiene discussed in detail. We advised no caffeine within 6-8 hours of sleep. Reduce alcohol consumption and eliminate if possible. Limited trial of Ambien 10 mg each bedtime when necessary and try to avoid regular use. We have warned him about not using sedative hypnotics in combination with alcohol or other sedatives. He understands risks.

## 2013-12-11 ENCOUNTER — Telehealth: Payer: Self-pay | Admitting: Family Medicine

## 2013-12-11 NOTE — Telephone Encounter (Signed)
lmom for pt to sch appt °

## 2013-12-11 NOTE — Telephone Encounter (Signed)
Your pt would like for md to accept his wife as new pt. Can I sch?

## 2013-12-11 NOTE — Telephone Encounter (Signed)
yes

## 2013-12-16 NOTE — Telephone Encounter (Signed)
Pt wife has been sch for 12-27-13

## 2013-12-16 NOTE — Telephone Encounter (Signed)
That's fine

## 2013-12-16 NOTE — Telephone Encounter (Signed)
Pt wife needs 4 pm new pt appt. Can I create 30 min appt on Friday. 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

## 2014-02-03 ENCOUNTER — Telehealth: Payer: Self-pay | Admitting: Family Medicine

## 2014-02-03 ENCOUNTER — Encounter: Payer: Self-pay | Admitting: Family Medicine

## 2014-02-03 ENCOUNTER — Ambulatory Visit (INDEPENDENT_AMBULATORY_CARE_PROVIDER_SITE_OTHER): Payer: BC Managed Care – PPO | Admitting: Family Medicine

## 2014-02-03 VITALS — BP 138/84 | HR 83 | Temp 98.7°F | Ht 70.0 in | Wt 217.5 lb

## 2014-02-03 DIAGNOSIS — R109 Unspecified abdominal pain: Secondary | ICD-10-CM

## 2014-02-03 LAB — CBC WITH DIFFERENTIAL/PLATELET
BASOS ABS: 0 10*3/uL (ref 0.0–0.1)
Basophils Relative: 0.2 % (ref 0.0–3.0)
Eosinophils Absolute: 0.3 10*3/uL (ref 0.0–0.7)
Eosinophils Relative: 1.8 % (ref 0.0–5.0)
HCT: 49.5 % (ref 39.0–52.0)
HEMOGLOBIN: 16.8 g/dL (ref 13.0–17.0)
Lymphocytes Relative: 16.7 % (ref 12.0–46.0)
Lymphs Abs: 2.5 10*3/uL (ref 0.7–4.0)
MCHC: 33.9 g/dL (ref 30.0–36.0)
MCV: 100 fl (ref 78.0–100.0)
MONO ABS: 1.2 10*3/uL — AB (ref 0.1–1.0)
Monocytes Relative: 8 % (ref 3.0–12.0)
Neutro Abs: 11.2 10*3/uL — ABNORMAL HIGH (ref 1.4–7.7)
Neutrophils Relative %: 73.3 % (ref 43.0–77.0)
PLATELETS: 280 10*3/uL (ref 150.0–400.0)
RBC: 4.95 Mil/uL (ref 4.22–5.81)
RDW: 14.2 % (ref 11.5–15.5)
WBC: 15.2 10*3/uL — AB (ref 4.0–10.5)

## 2014-02-03 LAB — POCT URINALYSIS DIPSTICK
Bilirubin, UA: NEGATIVE
Blood, UA: NEGATIVE
Glucose, UA: NEGATIVE
LEUKOCYTES UA: NEGATIVE
NITRITE UA: NEGATIVE
PH UA: 5.5
Spec Grav, UA: >=1.03
UROBILINOGEN UA: 0.2

## 2014-02-03 MED ORDER — OXYCODONE-ACETAMINOPHEN 5-325 MG PO TABS: 1.0000 | ORAL_TABLET | ORAL | Status: DC | PRN

## 2014-02-03 NOTE — Patient Instructions (Signed)
Follow up promptly for any fever, abdominal pain, or recurrent flank pain.

## 2014-02-03 NOTE — Progress Notes (Signed)
Pre visit review using our clinic review tool, if applicable. No additional management support is needed unless otherwise documented below in the visit note. 

## 2014-02-03 NOTE — Telephone Encounter (Signed)
Patient Information:  Caller Name: Jason [name]  Phone: [phone]  Patient: Jason [name]  Gender: Male  DOB: [birth year]  Age: 48 Years  PCP: Evelena Peat [facility])  Office Follow Up:  Does the office need to follow up with this patient?: No  Instructions For The Office: N/A   Symptoms  Reason For Call & Symptoms: Came home from work today 6/8 at 5 am then sudden onset of severe back and right  side pain  - found him on floor rolling in pain, repeatedly yelling for wife to get help for him so she called 911.  He appeared SOB and c/o dizziness.  911 did 2 EKGs but she doesnot have a copy of results.  One eye appeared red and this has happened before at intervals.  Currently resting and more comfortable  Reviewed Health History In EMR: Yes  Reviewed Medications In EMR: Yes  Reviewed Allergies In EMR: Yes  Reviewed Surgeries / Procedures: Yes  Date of Onset of Symptoms: 02/03/2014  Guideline(s) Used:  Back Pain  Disposition Per Guideline:   See Today or Tomorrow in Office  Reason For Disposition Reached:   Patient wants to be seen  Advice Given:  Sleep:  Sleep on your side with a pillow between your knees. If you sleep on your back, put a pillow under your knees.  Your mattress should be firm. Avoid waterbeds.  Call Back If:  Numbness or weakness occur  Bowel/bladder problems occur  You become worse.  Patient Will Follow Care Advice:  YES  Appointment Scheduled:  02/03/2014 14:45:00 Appointment Scheduled Provider:  Evelena Peat [facility])

## 2014-02-03 NOTE — Telephone Encounter (Signed)
Relevant patient education assigned to patient using Emmi. ° °

## 2014-02-03 NOTE — Progress Notes (Signed)
   Subjective:    Patient ID: Jason [name], male    DOB: 1966-04-05, 48 y.o.   MRN: [id number]  Back Pain Pertinent negatives include no abdominal pain, dysuria or fever.   Patient seen for onset bilateral severe flank pain this morning. Occurred around 5 AM. He was finishing up work shift. No known injury. Location is bilateral flank. Sharp quality. 10 out 10 severity. Pain is also known for about 15-20 minutes. No prior history of kidney stones. No gross hematuria. Denied any chest pain, abdominal pain, dizziness, radiation of pain, fever, chills, nausea, vomiting. No alleviating factors. No exacerbating factors. He was at home when this occurred and called EMS. EKG is reportedly normal. He had no chest pain whatsoever. No dyspnea. He is pain-free at this time has not had any recurrence. No recent exertional symptoms.  Denies any lumbar back pain. No recent appetite or weight changes. Pervasive fatigue. Labs last summer were unremarkable  Past Medical History  Diagnosis Date  . Anxiety   . Depression   . GERD (gastroesophageal reflux disease)   . Hyperlipidemia   . Hypertension   . Headache(784.0)   . Osteoarthritis    Past Surgical History  Procedure Laterality Date  . Rotator cuff repair      reports that he has been smoking Cigarettes.  He has a 50 pack-year smoking history. He has never used smokeless tobacco. He reports that he drinks about 12 ounces of alcohol per week. He reports that he does not use illicit drugs. family history includes Arthritis in his mother; Asthma in his maternal grandfather; Heart attack (age of onset: 85) in his father; Heart disease in his other; Hypertension in his father; Mental illness in his other; Stroke in his paternal grandfather. There is no history of Cancer, Diabetes, Drug abuse, Early death, Hearing loss, or Hyperlipidemia. Allergies  Allergen Reactions  . Olmesartan Medoxomil     REACTION: dizziness      Review of Systems    Constitutional: Positive for fatigue. Negative for fever, chills, appetite change and unexpected weight change.  Respiratory: Negative for cough and shortness of breath.   Gastrointestinal: Negative for nausea, vomiting, abdominal pain, diarrhea, blood in stool and abdominal distention.  Genitourinary: Negative for dysuria.  Musculoskeletal: Positive for back pain.  Skin: Negative for rash.  Neurological: Negative for dizziness and syncope.       Objective:   Physical Exam  Constitutional: He appears well-developed and well-nourished.  Cardiovascular: Normal rate and regular rhythm.   Pulmonary/Chest: Effort normal and breath sounds normal. No respiratory distress. He has no wheezes. He has no rales.  Abdominal: Soft. Bowel sounds are normal. He exhibits no distension. There is no tenderness. There is no rebound and no guarding.  Musculoskeletal: He exhibits no edema.  No reproducible flank tenderness at this time  Skin: No rash noted.          Assessment & Plan:  Acute bilateral flank pain. Improved at this time. Acute onset would suggest possible renal colic. However, his symptoms were bilateral. Check CBC and urinalysis. We wrote for limited Percocet 5 mg one to 2 every 4-6 hours for severe pain. Followup promptly for any recurrent symptoms

## 2014-02-04 ENCOUNTER — Telehealth: Payer: Self-pay | Admitting: Family Medicine

## 2014-02-04 NOTE — Telephone Encounter (Signed)
Pt needs blood work results °

## 2014-02-05 NOTE — Telephone Encounter (Signed)
Left message for patient to return call.

## 2014-06-09 ENCOUNTER — Encounter: Payer: Self-pay | Admitting: Family Medicine

## 2014-06-09 ENCOUNTER — Ambulatory Visit (INDEPENDENT_AMBULATORY_CARE_PROVIDER_SITE_OTHER): Payer: BC Managed Care – PPO | Admitting: Family Medicine

## 2014-06-09 VITALS — BP 140/74 | HR 92 | Temp 98.0°F | Wt 218.0 lb

## 2014-06-09 DIAGNOSIS — S39012A Strain of muscle, fascia and tendon of lower back, initial encounter: Secondary | ICD-10-CM

## 2014-06-09 MED ORDER — MELOXICAM 15 MG PO TABS: 15.0000 mg | ORAL_TABLET | Freq: Every day | ORAL | Status: DC

## 2014-06-09 MED ORDER — CYCLOBENZAPRINE HCL 10 MG PO TABS: 10.0000 mg | ORAL_TABLET | Freq: Three times a day (TID) | ORAL | Status: DC | PRN

## 2014-06-09 NOTE — Patient Instructions (Signed)

## 2014-06-09 NOTE — Progress Notes (Signed)
   Subjective:    Patient ID: Jason [name], male    DOB: 10-Jul-1966, 48 y.o.   MRN: [id number]  Back Pain Pertinent negatives include no abdominal pain, chest pain, dysuria, fever, numbness or weakness.   Low back pain. Onset Friday at work after lifting. Location is mid lumbar. No radiculopathy. No numbness. No weakness. Try over-the-counter ibuprofen without much relief. Worse with flexion and also ambulation. No loss of urine or stool control. No prior history of major back difficulties  Past Medical History  Diagnosis Date  . Anxiety   . Depression   . GERD (gastroesophageal reflux disease)   . Hyperlipidemia   . Hypertension   . Headache(784.0)   . Osteoarthritis    Past Surgical History  Procedure Laterality Date  . Rotator cuff repair      reports that he has been smoking Cigarettes.  He has a 50 pack-year smoking history. He has never used smokeless tobacco. He reports that he drinks about 12 ounces of alcohol per week. He reports that he does not use illicit drugs. family history includes Arthritis in his mother; Asthma in his maternal grandfather; Heart attack (age of onset: 7048) in his father; Heart disease in his other; Hypertension in his father; Mental illness in his other; Stroke in his paternal grandfather. There is no history of Cancer, Diabetes, Drug abuse, Early death, Hearing loss, or Hyperlipidemia. Allergies  Allergen Reactions  . Olmesartan Medoxomil     REACTION: dizziness      Review of Systems  Constitutional: Negative for fever, activity change and appetite change.  Respiratory: Negative for cough and shortness of breath.   Cardiovascular: Negative for chest pain and leg swelling.  Gastrointestinal: Negative for vomiting and abdominal pain.  Genitourinary: Negative for dysuria, hematuria and flank pain.  Musculoskeletal: Positive for back pain. Negative for joint swelling.  Neurological: Negative for weakness and numbness.       Objective:   Physical Exam  Constitutional: He is oriented to person, place, and time. He appears well-developed and well-nourished. No distress.  Neck: No thyromegaly present.  Cardiovascular: Normal rate, regular rhythm and normal heart sounds.   No murmur heard. Pulmonary/Chest: Effort normal and breath sounds normal. No respiratory distress. He has no wheezes. He has no rales.  Musculoskeletal: He exhibits no edema.  Straight leg raise are negative  Neurological: He is alert and oriented to person, place, and time. He has normal reflexes. No cranial nerve deficit.  Skin: No rash noted.          Assessment & Plan:  Lumbar back strain. Nonfocal exam neurologically. Flexeril 10 mg each bedtime. Continue heat for symptomatic relief. Stretches as tolerated. Meloxicam 15 mg once daily as needed. Touch base in 2 weeks if no better and sooner as needed

## 2014-06-09 NOTE — Progress Notes (Signed)
Pre visit review using our clinic review tool, if applicable. No additional management support is needed unless otherwise documented below in the visit note. 

## 2014-06-13 ENCOUNTER — Other Ambulatory Visit: Payer: Self-pay

## 2014-08-04 ENCOUNTER — Ambulatory Visit (INDEPENDENT_AMBULATORY_CARE_PROVIDER_SITE_OTHER): Payer: BC Managed Care – PPO | Admitting: Family Medicine

## 2014-08-04 ENCOUNTER — Encounter: Payer: Self-pay | Admitting: Family Medicine

## 2014-08-04 VITALS — BP 140/90 | HR 81 | Temp 98.5°F | Wt 224.0 lb

## 2014-08-04 DIAGNOSIS — H109 Unspecified conjunctivitis: Secondary | ICD-10-CM

## 2014-08-04 DIAGNOSIS — A499 Bacterial infection, unspecified: Secondary | ICD-10-CM

## 2014-08-04 DIAGNOSIS — H1089 Other conjunctivitis: Secondary | ICD-10-CM

## 2014-08-04 MED ORDER — TOBRAMYCIN 0.3 % OP SOLN: 2.0000 [drp] | OPHTHALMIC | Status: DC

## 2014-08-04 NOTE — Patient Instructions (Signed)

## 2014-08-04 NOTE — Progress Notes (Signed)
   Subjective:    Patient ID: Jason [name], male    DOB: November 05, 1965, 48 y.o.   MRN: [id number]  HPI Patient seen with bilateral eye patient right greater than left since last Wednesday. No contact use. No injury. He's noticed some redness. He has noted matting and thick colored drainage the mornings from both eyes. No blurred vision. No significant pain. No fevers or chills. He has used warm compresses with mild improvement  Past Medical History  Diagnosis Date  . Anxiety   . Depression   . GERD (gastroesophageal reflux disease)   . Hyperlipidemia   . Hypertension   . Headache(784.0)   . Osteoarthritis    Past Surgical History  Procedure Laterality Date  . Rotator cuff repair      reports that he has been smoking Cigarettes.  He has a 50 pack-year smoking history. He has never used smokeless tobacco. He reports that he drinks about 12.0 oz of alcohol per week. He reports that he does not use illicit drugs. family history includes Arthritis in his mother; Asthma in his maternal grandfather; Heart attack (age of onset: 7048) in his father; Heart disease in his other; Hypertension in his father; Mental illness in his other; Stroke in his paternal grandfather. There is no history of Cancer, Diabetes, Drug abuse, Early death, Hearing loss, or Hyperlipidemia. Allergies  Allergen Reactions  . Olmesartan Medoxomil     REACTION: dizziness      Review of Systems  Constitutional: Negative for fever and chills.  Eyes: Positive for discharge, redness and itching. Negative for pain and visual disturbance.       Objective:   Physical Exam  Constitutional: He appears well-developed and well-nourished. No distress.  HENT:  Head: Normocephalic and atraumatic.  Right Ear: External ear normal.  Left Ear: External ear normal.  Eyes:  Conjunctivae are erythematous bilaterally. No purulent drainage at this time. Cornea appears normal. Fundi benign  Cardiovascular: Normal rate and regular  rhythm.           Assessment & Plan:  Probable bilateral bacterial conjunctivitis. Warm compresses several times daily. Tobrex eyedrops 2 drops each every 4 hours while awake. Touch base in 3-4 days not improving

## 2014-08-04 NOTE — Progress Notes (Signed)
Pre visit review using our clinic review tool, if applicable. No additional management support is needed unless otherwise documented below in the visit note. 

## 2014-08-11 ENCOUNTER — Telehealth: Payer: Self-pay | Admitting: Family Medicine

## 2014-08-11 NOTE — Telephone Encounter (Signed)
Pt was seen on 12/7 and is calling to report his eye is no better. Please advise. [facility] [email or web address]

## 2014-08-11 NOTE — Telephone Encounter (Signed)
If eye still irritated recommend he see ophthalmologist.  Dr Hazle Quantigby or Emily FilbertGould would be options.

## 2014-08-12 ENCOUNTER — Other Ambulatory Visit: Payer: Self-pay | Admitting: Family Medicine

## 2014-08-12 DIAGNOSIS — H5789 Other specified disorders of eye and adnexa: Secondary | ICD-10-CM

## 2014-08-12 NOTE — Telephone Encounter (Signed)
Pt informed. Referral is ordered  

## 2014-09-11 ENCOUNTER — Encounter: Payer: Self-pay | Admitting: Family Medicine

## 2014-09-11 ENCOUNTER — Ambulatory Visit (INDEPENDENT_AMBULATORY_CARE_PROVIDER_SITE_OTHER): Payer: BLUE CROSS/BLUE SHIELD | Admitting: Family Medicine

## 2014-09-11 VITALS — BP 130/70 | HR 80 | Temp 97.5°F | Wt 221.0 lb

## 2014-09-11 DIAGNOSIS — N62 Hypertrophy of breast: Secondary | ICD-10-CM

## 2014-09-11 DIAGNOSIS — K130 Diseases of lips: Secondary | ICD-10-CM

## 2014-09-11 DIAGNOSIS — L821 Other seborrheic keratosis: Secondary | ICD-10-CM

## 2014-09-11 MED ORDER — CLOTRIMAZOLE-BETAMETHASONE 1-0.05 % EX CREA
1.0000 | TOPICAL_CREAM | Freq: Two times a day (BID) | CUTANEOUS | Status: DC
Start: 2014-09-11 — End: 2016-10-10

## 2014-09-11 NOTE — Progress Notes (Signed)
   Subjective:    Patient ID: Jason [name], male    DOB: 03-04-1966, 49 y.o.   MRN: [id number]  HPI Patient seen for several issues as follows  Slightly tender right breast tissue for the past month or so. No injury. No nipple discharge. No axillary adenopathy. Breast tissue seems to be slightly more prominent over the past year right greater than left. No prescription medications currently.  Patient's had some soreness involving the corners of the mouth bilaterally for several years now. He's tried things like Chapstick without improvement. No denture use.  Couple of dry scaly patches on lower legs. No prior history of skin cancer. These are asymptomatic.  Past Medical History  Diagnosis Date  . Anxiety   . Depression   . GERD (gastroesophageal reflux disease)   . Hyperlipidemia   . Hypertension   . Headache(784.0)   . Osteoarthritis    Past Surgical History  Procedure Laterality Date  . Rotator cuff repair      reports that he has been smoking Cigarettes.  He has a 50 pack-year smoking history. He has never used smokeless tobacco. He reports that he drinks about 12.0 oz of alcohol per week. He reports that he does not use illicit drugs. family history includes Arthritis in his mother; Asthma in his maternal grandfather; Heart attack (age of onset: 2048) in his father; Heart disease in his other; Hypertension in his father; Mental illness in his other; Stroke in his paternal grandfather. There is no history of Cancer, Diabetes, Drug abuse, Early death, Hearing loss, or Hyperlipidemia. Allergies  Allergen Reactions  . Olmesartan Medoxomil     REACTION: dizziness      Review of Systems  Constitutional: Negative for fever, chills, appetite change and unexpected weight change.  Respiratory: Negative for cough.   Cardiovascular: Negative for chest pain, palpitations and leg swelling.  Skin: Positive for rash.       Objective:   Physical Exam  Constitutional: He appears  well-developed and well-nourished.  HENT:  He has classic angular cheilitis corners of the mouth. No abnormal skin lesions  Neck: Neck supple.  Cardiovascular: Normal rate and regular rhythm.   Pulmonary/Chest: Effort normal and breath sounds normal.  Somewhat prominent glandular tissue both breast right greater than left. No nipple inversion. No skin rashes. No distinct masses. No right axillary adenopathy  Lymphadenopathy:    He has no cervical adenopathy.  Skin:  He has a couple of well-demarcated slightly brownish colored scaly and benign appearing seborrheic keratoses on the leg          Assessment & Plan:  #1 angular cheilitis. Lotrisone cream twice daily as needed. #2 gynecomastia. We explained this is common at his age. Follow-up promptly for any nipple discharge, nipple inversion, breast masses or other concerns #3 seborrheic keratoses leg. Reassured these are benign

## 2014-09-11 NOTE — Progress Notes (Signed)
Pre visit review using our clinic review tool, if applicable. No additional management support is needed unless otherwise documented below in the visit note. 

## 2016-02-16 DIAGNOSIS — H00025 Hordeolum internum left lower eyelid: Secondary | ICD-10-CM | POA: Diagnosis not present

## 2016-02-16 DIAGNOSIS — H10502 Unspecified blepharoconjunctivitis, left eye: Secondary | ICD-10-CM | POA: Diagnosis not present

## 2016-02-16 DIAGNOSIS — H2513 Age-related nuclear cataract, bilateral: Secondary | ICD-10-CM | POA: Diagnosis not present

## 2016-06-27 ENCOUNTER — Encounter: Payer: Self-pay | Admitting: Family Medicine

## 2016-06-27 ENCOUNTER — Ambulatory Visit (INDEPENDENT_AMBULATORY_CARE_PROVIDER_SITE_OTHER): Payer: BLUE CROSS/BLUE SHIELD | Admitting: Family Medicine

## 2016-06-27 VITALS — BP 136/70 | HR 84 | Temp 98.5°F | Ht 70.0 in | Wt 208.3 lb

## 2016-06-27 DIAGNOSIS — R062 Wheezing: Secondary | ICD-10-CM

## 2016-06-27 DIAGNOSIS — J209 Acute bronchitis, unspecified: Secondary | ICD-10-CM

## 2016-06-27 MED ORDER — PREDNISONE 20 MG PO TABS: ORAL_TABLET | ORAL | 0 refills | Status: DC

## 2016-06-27 MED ORDER — ALBUTEROL SULFATE HFA 108 (90 BASE) MCG/ACT IN AERS: 2.0000 | INHALATION_SPRAY | RESPIRATORY_TRACT | 1 refills | Status: DC | PRN

## 2016-06-27 MED ORDER — DOXYCYCLINE HYCLATE 100 MG PO CAPS: 100.0000 mg | ORAL_CAPSULE | Freq: Two times a day (BID) | ORAL | 0 refills | Status: DC

## 2016-06-27 NOTE — Progress Notes (Signed)
Pre visit review using our clinic review tool, if applicable. No additional management support is needed unless otherwise documented below in the visit note. 

## 2016-06-27 NOTE — Patient Instructions (Signed)

## 2016-06-27 NOTE — Progress Notes (Signed)
Subjective:     Patient ID: Jason [name], male   DOB: Mar 16, 1966, 50 y.o.   MRN: [id number]  HPI Patient is a long-term smoker who seen with onset of cough last week. Cough is mostly dry but occasionally productive. He's had some chills but no documented fever. He has wheezing which seems to worse at night. No hemoptysis. No recent appetite or weight changes. No sick contacts. Minimal nasal congestion.  Past Medical History:  Diagnosis Date  . Anxiety   . Depression   . GERD (gastroesophageal reflux disease)   . Headache(784.0)   . Hyperlipidemia   . Hypertension   . Osteoarthritis    Past Surgical History:  Procedure Laterality Date  . ROTATOR CUFF REPAIR      reports that he has been smoking Cigarettes.  He has a 50.00 pack-year smoking history. He has never used smokeless tobacco. He reports that he drinks about 12.0 oz of alcohol per week . He reports that he does not use drugs. family history includes Arthritis in his mother; Asthma in his maternal grandfather; Heart attack (age of onset: 4048) in his father; Heart disease in his other; Hypertension in his father; Mental illness in his other; Stroke in his paternal grandfather. Allergies  Allergen Reactions  . Olmesartan Medoxomil     REACTION: dizziness     Review of Systems  Constitutional: Positive for chills and fatigue.  HENT: Positive for congestion. Negative for sore throat.   Respiratory: Positive for cough and wheezing.   Cardiovascular: Negative for chest pain.       Objective:   Physical Exam  Constitutional: He appears well-developed and well-nourished.  HENT:  Right Ear: External ear normal.  Left Ear: External ear normal.  Mouth/Throat: Oropharynx is clear and moist.  Neck: Neck supple.  Cardiovascular: Normal rate and regular rhythm.   Pulmonary/Chest: Effort normal. No respiratory distress. He has wheezes. He has no rales.  Lymphadenopathy:    He has no cervical adenopathy.       Assessment:      Cough probably secondary to acute bronchitis. He has reactive airway findings on exam but is in no respiratory distress. Pulse oximetry 95%    Plan:     -Albuterol 2 puffs every 4-6 hours as needed for wheeze and cough -Doxycycline 100 mg twice daily for 10 days -Prednisone taper over the next week -He is encouraged to stop smoking -Follow-up promptly for any increased fever or increased shortness of breath  Kristian CoveyBruce W Alaynah Schutter MD [facility]

## 2016-08-30 ENCOUNTER — Encounter: Payer: Self-pay | Admitting: Family Medicine

## 2016-08-30 ENCOUNTER — Ambulatory Visit (INDEPENDENT_AMBULATORY_CARE_PROVIDER_SITE_OTHER): Payer: BLUE CROSS/BLUE SHIELD | Admitting: Family Medicine

## 2016-08-30 VITALS — BP 144/88 | HR 77 | Temp 98.3°F | Ht 70.0 in | Wt 223.0 lb

## 2016-08-30 DIAGNOSIS — G629 Polyneuropathy, unspecified: Secondary | ICD-10-CM | POA: Diagnosis not present

## 2016-08-30 DIAGNOSIS — R638 Other symptoms and signs concerning food and fluid intake: Secondary | ICD-10-CM | POA: Diagnosis not present

## 2016-08-30 DIAGNOSIS — E785 Hyperlipidemia, unspecified: Secondary | ICD-10-CM

## 2016-08-30 DIAGNOSIS — I10 Essential (primary) hypertension: Secondary | ICD-10-CM | POA: Diagnosis not present

## 2016-08-30 DIAGNOSIS — R5383 Other fatigue: Secondary | ICD-10-CM | POA: Diagnosis not present

## 2016-08-30 DIAGNOSIS — M1990 Unspecified osteoarthritis, unspecified site: Secondary | ICD-10-CM

## 2016-08-30 DIAGNOSIS — M2550 Pain in unspecified joint: Secondary | ICD-10-CM | POA: Diagnosis not present

## 2016-08-30 DIAGNOSIS — IMO0001 Reserved for inherently not codable concepts without codable children: Secondary | ICD-10-CM

## 2016-08-30 NOTE — Progress Notes (Signed)
Subjective:     Patient ID: Jason [name], male   DOB: 12-27-1965, 51 y.o.   MRN: [id number]  HPI Patient seen with multiple aches and pains. Had some recent progressive right lateral elbow pain worse with gripping and also complains of low back pain and bilateral knee pain. Has not noted any edema or joint effusions. He has seen orthopedics in the past. He having significant night pain. Denies any recent injury  He has history of hypertension, obstructive sleep apnea, GERD, osteoarthritis, dyslipidemia, ongoing nicotine use, alcohol abuse. He states he is currently drinking about 20 ounces of wine daily Still smoking. No recent exertional chest pain. Sometimes wakes up at night with reflux symptoms and dyspnea.  He is recently noticed some increased thirst and occasional blurred vision.  He complains of increased tingling sensation in his lower extremities and occasional numbness especially involving the right anterior thigh but also to some extent feet and lower legs bilaterally. He also has occasional tingling sensation of both hands.  History of poor compliance with follow-up. Currently taking no prescription medications.  Past Medical History:  Diagnosis Date  . Anxiety   . Depression   . GERD (gastroesophageal reflux disease)   . Headache(784.0)   . Hyperlipidemia   . Hypertension   . Osteoarthritis    Past Surgical History:  Procedure Laterality Date  . ROTATOR CUFF REPAIR      reports that he has been smoking Cigarettes.  He has a 50.00 pack-year smoking history. He has never used smokeless tobacco. He reports that he drinks about 12.0 oz of alcohol per week . He reports that he does not use drugs. family history includes Arthritis in his mother; Asthma in his maternal grandfather; Heart attack (age of onset: 67) in his father; Heart disease in his other; Hypertension in his father; Mental illness in his other; Stroke in his paternal grandfather. Allergies  Allergen  Reactions  . Olmesartan Medoxomil     REACTION: dizziness     Review of Systems  Constitutional: Positive for fatigue. Negative for chills and fever.  Respiratory: Positive for shortness of breath. Negative for cough.   Cardiovascular: Negative for chest pain, palpitations and leg swelling.  Gastrointestinal: Negative for abdominal pain, nausea and vomiting.  Endocrine: Positive for polydipsia.  Musculoskeletal: Positive for arthralgias and back pain.  Neurological: Negative for dizziness.  Hematological: Negative for adenopathy. Does not bruise/bleed easily.  Psychiatric/Behavioral: Negative for dysphoric mood.       Objective:   Physical Exam  Constitutional: He is oriented to person, place, and time. He appears well-developed and well-nourished.  Neck: Neck supple. No thyromegaly present.  Cardiovascular: Normal rate and regular rhythm.   Pulmonary/Chest: Effort normal and breath sounds normal. No respiratory distress. He has no wheezes. He has no rales.  Musculoskeletal: He exhibits no edema.  He has some tenderness over the right lateral epicondylar region. No visible joint edema, erythema, or warmth.  Neurological: He is alert and oriented to person, place, and time. He has normal reflexes. Coordination normal.  Normal sensory with monofilament both feet. Romberg test is normal       Assessment:     #1 Polyarthralgias. Suspect he has some osteoarthritis involving multiple joints.  #2 alcohol abuse  #3 ongoing nicotine use  #4 obesity  #5 polydipsia. High risk for type 2 diabetes  #6 dyslipidemia currently untreated  #7 hypertension currently not on medication  #7 polyneuropathy symptoms. Could be related to alcohol and/or diabetes versus other  Plan:     -Multiple issues addressed. -Needs complete physical and strongly advise setting this up-soon -Obtain labs including TSH, B12, basic metabolic panel, hemoglobin A1c, sedimentation rate, lipid, hepatic  panel -Strongly advised to reduce alcohol consumption and quit smoking. His motivation seems to be low -Needs to lose some weight -We discussed the risk of nonsteroidal therapy for his arthritis especially with alcohol abuse history as above -would also be careful with Tylenol until we evaluate his liver.  Kristian CoveyBruce W Burchette MD [facility]

## 2016-08-30 NOTE — Progress Notes (Signed)
Pre visit review using our clinic review tool, if applicable. No additional management support is needed unless otherwise documented below in the visit note. 

## 2016-08-31 LAB — VITAMIN B12: Vitamin B-12: 191 pg/mL — ABNORMAL LOW (ref 211–911)

## 2016-08-31 LAB — CBC WITH DIFFERENTIAL/PLATELET
BASOS ABS: 0.1 10*3/uL (ref 0.0–0.1)
Basophils Relative: 1 % (ref 0.0–3.0)
EOS ABS: 0.3 10*3/uL (ref 0.0–0.7)
Eosinophils Relative: 2.3 % (ref 0.0–5.0)
HEMATOCRIT: 48.5 % (ref 39.0–52.0)
HEMOGLOBIN: 17 g/dL (ref 13.0–17.0)
LYMPHS PCT: 19.5 % (ref 12.0–46.0)
Lymphs Abs: 2.2 10*3/uL (ref 0.7–4.0)
MCHC: 35 g/dL (ref 30.0–36.0)
MCV: 97.4 fl (ref 78.0–100.0)
MONOS PCT: 8.9 % (ref 3.0–12.0)
Monocytes Absolute: 1 10*3/uL (ref 0.1–1.0)
NEUTROS ABS: 7.8 10*3/uL — AB (ref 1.4–7.7)
Neutrophils Relative %: 68.3 % (ref 43.0–77.0)
PLATELETS: 235 10*3/uL (ref 150.0–400.0)
RBC: 4.98 Mil/uL (ref 4.22–5.81)
RDW: 14 % (ref 11.5–15.5)
WBC: 11.4 10*3/uL — AB (ref 4.0–10.5)

## 2016-08-31 LAB — BASIC METABOLIC PANEL
BUN: 9 mg/dL (ref 6–23)
CHLORIDE: 99 meq/L (ref 96–112)
CO2: 22 meq/L (ref 19–32)
Calcium: 9.4 mg/dL (ref 8.4–10.5)
Creatinine, Ser: 0.84 mg/dL (ref 0.40–1.50)
GFR: 102.73 mL/min (ref >60.00)
GLUCOSE: 127 mg/dL — AB (ref 70–99)
POTASSIUM: 3.9 meq/L (ref 3.5–5.1)
SODIUM: 136 meq/L (ref 135–145)

## 2016-08-31 LAB — HEPATIC FUNCTION PANEL
ALK PHOS: 80 U/L (ref 39–117)
ALT: 38 U/L (ref 0–53)
AST: 30 U/L (ref 0–37)
Albumin: 4.6 g/dL (ref 3.5–5.2)
BILIRUBIN DIRECT: 0.1 mg/dL (ref 0.0–0.3)
BILIRUBIN TOTAL: 0.5 mg/dL (ref 0.2–1.2)
Total Protein: 7.2 g/dL (ref 6.0–8.3)

## 2016-08-31 LAB — LDL CHOLESTEROL, DIRECT: LDL DIRECT: 138 mg/dL

## 2016-08-31 LAB — HEMOGLOBIN A1C: Hgb A1c MFr Bld: 6.5 % (ref 4.6–6.5)

## 2016-08-31 LAB — TSH: TSH: 4.82 u[IU]/mL — ABNORMAL HIGH (ref 0.35–4.50)

## 2016-08-31 LAB — LIPID PANEL
CHOL/HDL RATIO: 7
Cholesterol: 274 mg/dL — ABNORMAL HIGH (ref 0–200)
HDL: 36.9 mg/dL — AB (ref >39.00)

## 2016-08-31 LAB — PSA: PSA: 1.14 ng/mL (ref 0.10–4.00)

## 2016-08-31 LAB — SEDIMENTATION RATE: SED RATE: 23 mm/h — AB (ref 0–20)

## 2016-09-14 ENCOUNTER — Encounter: Payer: BLUE CROSS/BLUE SHIELD | Admitting: Family Medicine

## 2016-09-19 ENCOUNTER — Encounter: Payer: Self-pay | Admitting: Family Medicine

## 2016-09-19 ENCOUNTER — Ambulatory Visit (INDEPENDENT_AMBULATORY_CARE_PROVIDER_SITE_OTHER): Payer: BLUE CROSS/BLUE SHIELD | Admitting: Family Medicine

## 2016-09-19 VITALS — BP 150/78 | HR 86 | Temp 98.4°F | Ht 70.0 in | Wt 214.0 lb

## 2016-09-19 DIAGNOSIS — Z0000 Encounter for general adult medical examination without abnormal findings: Secondary | ICD-10-CM

## 2016-09-19 DIAGNOSIS — F1010 Alcohol abuse, uncomplicated: Secondary | ICD-10-CM | POA: Diagnosis not present

## 2016-09-19 DIAGNOSIS — R0609 Other forms of dyspnea: Secondary | ICD-10-CM

## 2016-09-19 DIAGNOSIS — Z0001 Encounter for general adult medical examination with abnormal findings: Secondary | ICD-10-CM

## 2016-09-19 DIAGNOSIS — R739 Hyperglycemia, unspecified: Secondary | ICD-10-CM | POA: Insufficient documentation

## 2016-09-19 DIAGNOSIS — F17200 Nicotine dependence, unspecified, uncomplicated: Secondary | ICD-10-CM

## 2016-09-19 DIAGNOSIS — E785 Hyperlipidemia, unspecified: Secondary | ICD-10-CM

## 2016-09-19 DIAGNOSIS — E538 Deficiency of other specified B group vitamins: Secondary | ICD-10-CM | POA: Diagnosis not present

## 2016-09-19 DIAGNOSIS — G4730 Sleep apnea, unspecified: Secondary | ICD-10-CM

## 2016-09-19 MED ORDER — CYANOCOBALAMIN 1000 MCG/ML IJ SOLN
1000.0000 ug | Freq: Once | INTRAMUSCULAR | Status: AC
  Administered 2016-09-19: 1000 ug via INTRAMUSCULAR

## 2016-09-19 MED ORDER — ATORVASTATIN CALCIUM 20 MG PO TABS: 20.0000 mg | ORAL_TABLET | Freq: Every day | ORAL | 3 refills | Status: DC

## 2016-09-19 NOTE — Patient Instructions (Addendum)
-  Start Lipitor 20 mg once daily -start OTC multivitamin with thiamin -start OTC B 12 1,000 mcg once daily -We will set up heart stress test -will need colonoscopy but would wait to set that up -scale back sugars and white starches -recommend look at inpatient program for alcohol cessation such as [facility].   -we need to plan follow up in about 2 months to recheck lipids, A1C and thyroid.

## 2016-09-19 NOTE — Progress Notes (Signed)
Pre visit review using our clinic review tool, if applicable. No additional management support is needed unless otherwise documented below in the visit note. 

## 2016-09-19 NOTE — Progress Notes (Signed)
Subjective:     Patient ID: Jason [name], male   DOB: 1965-11-30, 51 y.o.   MRN: [id number]  HPI Patient is here for physical exam. He was here recently with polyarthralgias. We obtained lab work and this came back with multiple abnormalities. He has elevated glucose with recent fasting 127 and hemoglobin A1c 6.5%. He has dyslipidemia with severely elevated cholesterol and high triglycerides. He has ongoing nicotine use. History of alcohol abuse. He apparently drinking equivalent of 1 and sometimes over 2 bottles of wine per day. Also had recent low B12 of 191 with some neuropathy type symptoms.  He's not had previous screening colonoscopy. He has extremely high risk for heart disease. His father had coronary disease in his 51s or early 5150s. Patient has risk factors including family history, smoking status, dyslipidemia, hyperglycemia.  Wife relates that several nights ago he woke up in bed clutching at his chest though he has no recollection whatsoever. He has generally noticed increased dyspnea with exertion recently but has not had any exertional chest pain. He denies any prior stress testing. Wife also has concerns he may have obstructive sleep apnea. She noticed he sometimes seems to be gasping for breath at night and frequently snores.  Past Medical History:  Diagnosis Date  . Anxiety   . Depression   . GERD (gastroesophageal reflux disease)   . Headache(784.0)   . Hyperlipidemia   . Hypertension   . Osteoarthritis    Past Surgical History:  Procedure Laterality Date  . ROTATOR CUFF REPAIR      reports that he has been smoking Cigarettes.  He has a 50.00 pack-year smoking history. He has never used smokeless tobacco. He reports that he drinks about 12.0 oz of alcohol per week . He reports that he does not use drugs. family history includes Arthritis in his mother; Asthma in his maternal grandfather; Heart attack (age of onset: 7148) in his father; Heart disease in his other;  Hypertension in his father; Mental illness in his other; Stroke in his paternal grandfather. Allergies  Allergen Reactions  . Olmesartan Medoxomil     REACTION: dizziness     Review of Systems  Constitutional: Positive for fatigue. Negative for activity change, appetite change, fever and unexpected weight change.  HENT: Negative for congestion, ear pain and trouble swallowing.   Eyes: Negative for pain and visual disturbance.  Respiratory: Positive for shortness of breath. Negative for cough and wheezing.   Cardiovascular: Negative for chest pain, palpitations and leg swelling.  Gastrointestinal: Negative for abdominal distention, abdominal pain, blood in stool, constipation, diarrhea, nausea, rectal pain and vomiting.  Endocrine: Negative for polydipsia and polyuria.  Genitourinary: Negative for dysuria, hematuria and testicular pain.  Musculoskeletal: Negative for arthralgias and joint swelling.  Skin: Negative for rash.  Neurological: Positive for dizziness. Negative for syncope and headaches.  Hematological: Negative for adenopathy.  Psychiatric/Behavioral: Negative for agitation, confusion, dysphoric mood and suicidal ideas.       Objective:   Physical Exam  Constitutional: He is oriented to person, place, and time. He appears well-developed and well-nourished.  HENT:  Right Ear: External ear normal.  Left Ear: External ear normal.  Mouth/Throat: Oropharynx is clear and moist.  Eyes: Pupils are equal, round, and reactive to light.  Neck: Neck supple.  Cardiovascular: Normal rate and regular rhythm.   Pulmonary/Chest: Effort normal and breath sounds normal. No respiratory distress. He has no wheezes. He has no rales.  Abdominal: Soft. Bowel sounds are normal. He exhibits  no distension. There is no rebound and no guarding.  Liver edge is palpated below the right costal margin  Musculoskeletal: He exhibits no edema.  Lymphadenopathy:    He has no cervical adenopathy.   Neurological: He is alert and oriented to person, place, and time. No cranial nerve deficit.  Skin: No rash noted.       Assessment:     Patient presents for complete physical. Several issues are addressed below as per recent lab work and based on history -Ongoing nicotine use -Alcohol abuse -Mildly elevated TSH -Dyslipidemia with high cholesterol and high triglycerides and low HDL -Low B12 -Peripheral neuropathy symptoms which may be related to low B12 and also possibly related alcohol -Hyperglycemia with recent fasting glucose 127 , A1c 6.5%. Probably has early type 2 diabetes -Metabolic syndrome with positive criteria of high triglyceride, low HDL, elevated blood pressure, hyperglycemia, and elevated blood pressure. -Very high risk for CAD as above -No prior history of colonoscopy screening    Plan:     -EKG done in office shows sinus rhythm with no acute changes -We've advised nicotine cessation -We had long talk with patient and his wife regarding his alcohol abuse. He feels that he would have very difficult time quitting on his own. We've recommended consideration for inpatient program and they will look into [facility] -Set up nuclear stress test -Recommend consideration for sleep studies to rule out obstructive sleep apnea but will evaluate cardiac status first -Start multivitamin with thiamin -B12 1000 g IM given. We discussed IM replacement versus oral placement he prefers trial of the latter. Will start over-the-counter B12 1000 g daily -Recheck labs with B12, lipid panel, hemoglobin A1c in 3 months -Start Lipitor 20 mg once daily -Needs screening colonoscopy but needs to get above  issues above addressed first  [name] MD [facility] at [facility]

## 2016-09-21 ENCOUNTER — Telehealth: Payer: Self-pay | Admitting: Family Medicine

## 2016-09-21 NOTE — Telephone Encounter (Signed)
FYI PT wife is calling to let md know her husband will be [facility] on 09-24-16

## 2016-10-03 ENCOUNTER — Encounter: Payer: Self-pay | Admitting: Family Medicine

## 2016-10-03 ENCOUNTER — Ambulatory Visit (INDEPENDENT_AMBULATORY_CARE_PROVIDER_SITE_OTHER): Payer: BLUE CROSS/BLUE SHIELD | Admitting: Family Medicine

## 2016-10-03 VITALS — BP 120/80 | HR 81 | Ht 70.0 in | Wt 213.5 lb

## 2016-10-03 DIAGNOSIS — I10 Essential (primary) hypertension: Secondary | ICD-10-CM | POA: Diagnosis not present

## 2016-10-03 DIAGNOSIS — E538 Deficiency of other specified B group vitamins: Secondary | ICD-10-CM

## 2016-10-03 DIAGNOSIS — E782 Mixed hyperlipidemia: Secondary | ICD-10-CM | POA: Diagnosis not present

## 2016-10-03 DIAGNOSIS — F1010 Alcohol abuse, uncomplicated: Secondary | ICD-10-CM

## 2016-10-03 MED ORDER — NALTREXONE HCL 50 MG PO TABS: 50.0000 mg | ORAL_TABLET | Freq: Every day | ORAL | 0 refills | Status: DC

## 2016-10-03 NOTE — Patient Instructions (Addendum)
Naltrexone tablets What is this medicine? NALTREXONE (nal TREX one) helps you to remain free of your dependence on opiate drugs or alcohol. It blocks the 'high' that these substances can give you. This medicine is combined with counseling and support groups. This medicine may be used for other purposes; ask your health care provider or pharmacist if you have questions. COMMON BRAND NAME(S): Depade, ReVia What should I tell my health care provider before I take this medicine? They need to know if you have any of these conditions: -if you have used drugs or alcohol within 7 to 10 days -kidney disease -liver disease, including hepatitis -an unusual or allergic reaction to naltrexone, other medicines, foods, dyes, or preservatives -pregnant or trying to get pregnant -breast-feeding How should I use this medicine? Take this medicine by mouth with a full glass of water. Follow the directions on the prescription label. Do not take this medicine within 7 to 10 days of taking any opioid drugs. Take your medicine at regular intervals. Do not take your medicine more often than directed. Do not stop taking except on your doctor's advice. Talk to your pediatrician regarding the use of this medicine in children. Special care may be needed. Overdosage: If you think you have taken too much of this medicine contact a poison control center or emergency room at once. NOTE: This medicine is only for you. Do not share this medicine with others. What if I miss a dose? If you miss a dose and remember on the same day, take the missed dose. If you do not remember until the next day, ask your doctor or health care professional about rescheduling your doses. Do not take double or extra doses. What may interact with this medicine? Do not take this medicine with any of the following medications: -any prescription or street opioid drug like codiene, heroin, methadone This medicine may also interact with the following  medications: -disulfiram -thioridazine This list may not describe all possible interactions. Give your health care provider a list of all the medicines, herbs, non-prescription drugs, or dietary supplements you use. Also tell them if you smoke, drink alcohol, or use illegal drugs. Some items may interact with your medicine. What should I watch for while using this medicine? Your condition will be monitored carefully while you are receiving this medicine. Visit your doctor or health care professional regularly. For this medicine to be most effective you should attend any counseling or support groups that your doctor or health care professional recommends. Do not try to overcome the effects of the medicine by taking large amounts of narcotics or by drinking large amounts of alcohol. This can cause severe problems including death. Also, you may be more sensitive to lower doses of narcotics after you stop taking this medicine. If you are going to have surgery, tell your doctor or health care professional that you are taking this medicine. Do not treat yourself for coughs, colds, pain, or diarrhea. Ask your doctor or health care professional for advice. Some of the ingredients may interact with this medicine and cause side effects. Wear a medical ID bracelet or chain, and carry a card that describes your disease and details of your medicine and dosage times. You may get drowsy or dizzy. Do not drive, use machinery, or do anything that needs mental alertness until you know how this medicine affects you. Do not stand or sit up quickly, especially if you are an older patient. This reduces the risk of dizzy or fainting spells.   Alcohol may interfere with the effect of this medicine. Avoid alcoholic drinks. What side effects may I notice from receiving this medicine? Side effects that you should report to your doctor or health care professional as soon as possible: -allergic reactions like skin rash, itching or  hives, swelling of the face, lips, or tongue -breathing problems -changes in vision, hearing -confusion -dark urine -depressed mood -diarrhea -fast or irregular heart beat -hallucination, loss of contact with reality -light-colored stools -right upper belly pain -suicidal thoughts or other mood changes -unusually weak or tired -vomiting -yellowing of the eyes or skin Side effects that usually do not require medical attention (report to your doctor or health care professional if they continue or are bothersome): -aches, pains -change in sex drive or performance -feeling anxious -headache -loss of appetite, nausea -runny nose, sinus problems, sneezing -stomach pain -trouble sleeping This list may not describe all possible side effects. Call your doctor for medical advice about side effects. You may report side effects to FDA at 1-800-FDA-1088. Where should I keep my medicine? Keep out of the reach of children. Store at room temperature between 20 and 25 degrees C (68 and 77 degrees F). Throw away any unused medicine after the expiration date. NOTE: This sheet is a summary. It may not cover all possible information. If you have questions about this medicine, talk to your doctor, pharmacist, or health care provider.  2017 Elsevier/Gold Standard (2012-06-07 10:33:18)  Stop the Gemfibrozil Continue the multivitamin

## 2016-10-03 NOTE — Progress Notes (Signed)
Subjective:     Patient ID: Jason [name], male   DOB: 1966-02-05, 51 y.o.   MRN: [id number]  HPI   Patient seen for follow-up from [facility]. Long history of alcohol abuse. He was drinking up to 2 bottles of wine per day. He was discharged home just earlier today. He actually left the program slightly early. He states he was not interested in behavioral counseling aspect. He did go through the detox portion without difficulty. He was apparently prescribed gemfibrozil but has not been taking that yet. He brings in copy of several labs. He had mildly elevated AST and ALT of 59 and 52. Cholesterol 235, triglycerides 778. Folic acid level 4.5. TSH and B12 were normal.  Blood pressure is greatly improved today now that he is off alcohol. He was having some dyspnea and has ongoing nicotine use. We did order nuclear stress test which has not yet been scheduled. Recent B12 deficiency and is taking daily over-the-counter B12 1000 g. He was discharged on multivitamin with thiamine.  Denies any depression issues. No history of suicidal ideation. No history of opioid or other illicit drug use.  He's coming today his wife who has been very supportive. They've gotten rid of all alcohol in the home. Patient is fairly adamant that he plans not to drink again and he knows consequences of this  Past Medical History:  Diagnosis Date  . Anxiety   . Depression   . GERD (gastroesophageal reflux disease)   . Headache(784.0)   . Hyperlipidemia   . Hypertension   . Osteoarthritis    Past Surgical History:  Procedure Laterality Date  . ROTATOR CUFF REPAIR      reports that he has been smoking Cigarettes.  He has a 50.00 pack-year smoking history. He has never used smokeless tobacco. He reports that he drinks about 12.0 oz of alcohol per week . He reports that he does not use drugs. family history includes Arthritis in his mother; Asthma in his maternal  grandfather; Heart attack (age of onset: 3748) in his father; Heart disease in his other; Hypertension in his father; Mental illness in his other; Stroke in his paternal grandfather. Allergies  Allergen Reactions  . Olmesartan Medoxomil     REACTION: dizziness         Review of Systems  Constitutional: Negative for chills, fatigue and unexpected weight change.  Eyes: Negative for visual disturbance.  Respiratory: Negative for cough, chest tightness and shortness of breath.   Cardiovascular: Negative for chest pain, palpitations and leg swelling.  Gastrointestinal: Negative for abdominal pain, constipation, diarrhea, nausea and vomiting.  Genitourinary: Negative for dysuria.  Neurological: Negative for dizziness, syncope, weakness, light-headedness and headaches.  Psychiatric/Behavioral: Negative for agitation, dysphoric mood and suicidal ideas. The patient is not nervous/anxious.        Objective:   Physical Exam  Constitutional: He appears well-developed and well-nourished.  Neck: Neck supple.  Cardiovascular: Normal rate and regular rhythm.   Pulmonary/Chest: Effort normal and breath sounds normal. No respiratory distress. He has no wheezes. He has no rales.  Abdominal: Soft. Bowel sounds are normal. He exhibits no distension and no mass. There is no tenderness. There is no rebound and no guarding.  Musculoskeletal: He exhibits no edema.  Neurological: He is alert.  Psychiatric: He has a normal mood and affect. His behavior is normal. Judgment and thought content normal.       Assessment:     #1 alcohol  abuse with recent detox program through [facility]  #2 ongoing nicotine use  #3 dyslipidemia  #4 B12 deficiency now on oral replacement    Plan:     -Patient knows importance of not drinking any alcohol whatsoever. -We strongly advise consideration for outpatient behavioral counseling to help improve odds of adherence but he declines. -Patient was adamant that  he wanted to try medication to help improve abstinence as an outpatient. We explained limitations of these medications including risk of side effects . He knows of these cannot be used at all with alcohol. He is past withdrawal phase at this stage -we discussed possible use of Naltrexone 50 mg once daily- but again emphasized that use of this would be best if accompanied by ongoing behavioral/counseling component.  We reviewed possible side effects. He does not have any history of liver abnormalities other than very mild recent elevated liver transaminases -Discontinue gemfibrozil and we've also recommended he not start Lipitor yet until follow-up in one week. -Repeat liver transaminases at follow-up in one week -suspect his triglycerides will improve greatly off ETOH.  [name] MD [facility] at [facility]

## 2016-10-03 NOTE — Progress Notes (Signed)
Pre visit review using our clinic review tool, if applicable. No additional management support is needed unless otherwise documented below in the visit note. 

## 2016-10-10 ENCOUNTER — Encounter: Payer: Self-pay | Admitting: Family Medicine

## 2016-10-10 ENCOUNTER — Ambulatory Visit (INDEPENDENT_AMBULATORY_CARE_PROVIDER_SITE_OTHER): Payer: BLUE CROSS/BLUE SHIELD | Admitting: Family Medicine

## 2016-10-10 VITALS — BP 110/60 | HR 78 | Ht 70.0 in | Wt 209.0 lb

## 2016-10-10 DIAGNOSIS — R7401 Elevation of levels of liver transaminase levels: Secondary | ICD-10-CM | POA: Insufficient documentation

## 2016-10-10 DIAGNOSIS — R740 Nonspecific elevation of levels of transaminase and lactic acid dehydrogenase [LDH]: Secondary | ICD-10-CM

## 2016-10-10 DIAGNOSIS — I10 Essential (primary) hypertension: Secondary | ICD-10-CM | POA: Diagnosis not present

## 2016-10-10 DIAGNOSIS — F1010 Alcohol abuse, uncomplicated: Secondary | ICD-10-CM

## 2016-10-10 DIAGNOSIS — E782 Mixed hyperlipidemia: Secondary | ICD-10-CM

## 2016-10-10 DIAGNOSIS — E538 Deficiency of other specified B group vitamins: Secondary | ICD-10-CM

## 2016-10-10 NOTE — Progress Notes (Signed)
Pre visit review using our clinic review tool, if applicable. No additional management support is needed unless otherwise documented below in the visit note. 

## 2016-10-10 NOTE — Progress Notes (Signed)
Subjective:     Patient ID: Tobin Chaddriaan J Suriano, male   DOB: 29-Dec-1965, 51 y.o.   MRN: [id number]  HPI Patient seen for follow-up regarding history of alcohol abuse and [facility]. He is doing extremely well overall. He had requested trial of naltrexone and has done well with that. Had some mild anxiety and occasional sleep difficulties. No relapses with alcohol whatsoever. He had several abnormalities recently including low B12, slightly elevated ammonia level at detox, elevated liver transaminases, elevated triglycerides. He was written for both Lopid and Lipitor and we recommend holding both drugs until we get follow-up liver panel. He is taking oral B12 replacement. He is on multivitamin with thiamine. He has resumed work.  He's had somewhat poor appetite but is trying to eat a regular balanced diet for the first time in several months. He had recent A1c 6.2% and he and his wife are trying to reduce sugars and starches.  Past Medical History:  Diagnosis Date  . Anxiety   . Depression   . GERD (gastroesophageal reflux disease)   . Headache(784.0)   . Hyperlipidemia   . Hypertension   . Osteoarthritis    Past Surgical History:  Procedure Laterality Date  . ROTATOR CUFF REPAIR      reports that he has been smoking Cigarettes.  He has a 50.00 pack-year smoking history. He has never used smokeless tobacco. He reports that he drinks about 12.0 oz of alcohol per week . He reports that he does not use drugs. family history includes Arthritis in his mother; Asthma in his maternal grandfather; Heart attack (age of onset: 1848) in his father; Heart disease in his other; Hypertension in his father; Mental illness in his other; Stroke in his paternal grandfather. Allergies  Allergen Reactions  . Olmesartan Medoxomil     REACTION: dizziness     Review of Systems  Constitutional: Positive for appetite change. Negative for fatigue.  Eyes: Negative for visual  disturbance.  Respiratory: Negative for cough, chest tightness and shortness of breath.   Cardiovascular: Negative for chest pain, palpitations and leg swelling.  Neurological: Negative for dizziness, syncope, weakness, light-headedness and headaches.       Objective:   Physical Exam  Constitutional: He is oriented to person, place, and time. He appears well-developed and well-nourished.  HENT:  Right Ear: External ear normal.  Left Ear: External ear normal.  Mouth/Throat: Oropharynx is clear and moist.  Eyes: Pupils are equal, round, and reactive to light.  Neck: Neck supple. No thyromegaly present.  Cardiovascular: Normal rate and regular rhythm.   Pulmonary/Chest: Effort normal and breath sounds normal. No respiratory distress. He has no wheezes. He has no rales.  Musculoskeletal: He exhibits no edema.  Neurological: He is alert and oriented to person, place, and time.       Assessment:     #1 history of alcohol abuse with recent detoxification  #2 elevated liver transaminases related alcohol abuse  #3 history of elevated blood pressure improved off alcohol  #4 dyslipidemia  #5 B12 deficiency on oral replacement  #6 hyperglycemia with recent A1c 6.2%      Plan:     -Recheck ammonia level along with liver panel -If liver transaminases normalizing start back Lipitor - Continue naltrexone 50 mg daily -follow up in 2 months and repeat B12 level and lipids then  Kristian CoveyBruce W Sahily Biddle MD [facility]

## 2016-10-11 ENCOUNTER — Encounter: Payer: Self-pay | Admitting: Family Medicine

## 2016-10-11 ENCOUNTER — Other Ambulatory Visit: Payer: Self-pay

## 2016-10-11 DIAGNOSIS — F1010 Alcohol abuse, uncomplicated: Secondary | ICD-10-CM

## 2016-10-11 LAB — HEPATIC FUNCTION PANEL
ALK PHOS: 74 U/L (ref 39–117)
ALT: 28 U/L (ref 0–53)
AST: 21 U/L (ref 0–37)
Albumin: 4.4 g/dL (ref 3.5–5.2)
Bilirubin, Direct: 0.1 mg/dL (ref 0.0–0.3)
Total Bilirubin: 0.3 mg/dL (ref 0.2–1.2)
Total Protein: 7.2 g/dL (ref 6.0–8.3)

## 2016-10-19 ENCOUNTER — Telehealth (HOSPITAL_COMMUNITY): Payer: Self-pay | Admitting: *Deleted

## 2016-10-19 NOTE — Telephone Encounter (Signed)
Patient given detailed instructions per Myocardial Perfusion Study Information Sheet for the test on 10/21/16. Patient notified to arrive 15 minutes early and that it is imperative to arrive on time for appointment to keep from having the test rescheduled.  If you need to cancel or reschedule your appointment, please call the office within 24 hours of your appointment. Failure to do so may result in a cancellation of your appointment, and a $50 no show fee. Patient verbalized understanding. Jerold Yoss Jacqueline    

## 2016-10-21 ENCOUNTER — Ambulatory Visit (HOSPITAL_COMMUNITY): Payer: BLUE CROSS/BLUE SHIELD | Attending: Cardiology

## 2016-10-21 DIAGNOSIS — R0609 Other forms of dyspnea: Secondary | ICD-10-CM | POA: Insufficient documentation

## 2016-10-21 LAB — MYOCARDIAL PERFUSION IMAGING
CHL CUP NUCLEAR SDS: 0
CHL CUP NUCLEAR SRS: 0
CHL CUP NUCLEAR SSS: 0
CHL CUP RESTING HR STRESS: 64 {beats}/min
LV dias vol: 132 mL (ref 62–150)
LV sys vol: 61 mL
NUC STRESS TID: 1.09
Peak HR: 106 {beats}/min
RATE: 0.33

## 2016-10-21 MED ORDER — TECHNETIUM TC 99M TETROFOSMIN IV KIT
10.4000 | PACK | Freq: Once | INTRAVENOUS | Status: AC | PRN
  Administered 2016-10-21: 10.4 via INTRAVENOUS
  Filled 2016-10-21: qty 11

## 2016-10-21 MED ORDER — TECHNETIUM TC 99M TETROFOSMIN IV KIT
32.7000 | PACK | Freq: Once | INTRAVENOUS | Status: AC | PRN
  Administered 2016-10-21: 32.7 via INTRAVENOUS
  Filled 2016-10-21: qty 33

## 2016-10-21 MED ORDER — REGADENOSON 0.4 MG/5ML IV SOLN
0.4000 mg | Freq: Once | INTRAVENOUS | Status: AC
  Administered 2016-10-21: 0.4 mg via INTRAVENOUS

## 2016-10-23 ENCOUNTER — Encounter: Payer: Self-pay | Admitting: Family Medicine

## 2016-11-01 ENCOUNTER — Other Ambulatory Visit: Payer: Self-pay | Admitting: Family Medicine

## 2016-11-01 NOTE — Telephone Encounter (Signed)
Last refill was 10/03/16.  Last office visit 10/10/16.  Okay to fill?

## 2016-11-01 NOTE — Telephone Encounter (Signed)
Patient has a follow up appointment 12/09/16

## 2016-11-01 NOTE — Telephone Encounter (Signed)
Refill once and make sure he has follow up within one month to reassess.

## 2016-12-09 ENCOUNTER — Encounter: Payer: Self-pay | Admitting: Family Medicine

## 2016-12-09 ENCOUNTER — Ambulatory Visit (INDEPENDENT_AMBULATORY_CARE_PROVIDER_SITE_OTHER): Payer: BLUE CROSS/BLUE SHIELD | Admitting: Family Medicine

## 2016-12-09 VITALS — BP 120/70 | HR 74 | Temp 98.4°F | Wt 203.7 lb

## 2016-12-09 DIAGNOSIS — R739 Hyperglycemia, unspecified: Secondary | ICD-10-CM | POA: Diagnosis not present

## 2016-12-09 DIAGNOSIS — M1990 Unspecified osteoarthritis, unspecified site: Secondary | ICD-10-CM | POA: Diagnosis not present

## 2016-12-09 DIAGNOSIS — E538 Deficiency of other specified B group vitamins: Secondary | ICD-10-CM | POA: Diagnosis not present

## 2016-12-09 DIAGNOSIS — E782 Mixed hyperlipidemia: Secondary | ICD-10-CM | POA: Diagnosis not present

## 2016-12-09 DIAGNOSIS — R946 Abnormal results of thyroid function studies: Secondary | ICD-10-CM

## 2016-12-09 DIAGNOSIS — I10 Essential (primary) hypertension: Secondary | ICD-10-CM | POA: Diagnosis not present

## 2016-12-09 DIAGNOSIS — R7989 Other specified abnormal findings of blood chemistry: Secondary | ICD-10-CM

## 2016-12-09 LAB — LIPID PANEL
CHOLESTEROL: 176 mg/dL (ref <200)
HDL: 31 mg/dL — AB (ref >40)
LDL Cholesterol: 108 mg/dL — ABNORMAL HIGH (ref <100)
TRIGLYCERIDES: 183 mg/dL — AB (ref <150)
Total CHOL/HDL Ratio: 5.7 Ratio — ABNORMAL HIGH (ref <5.0)
VLDL: 37 mg/dL — ABNORMAL HIGH (ref <30)

## 2016-12-09 LAB — HEPATIC FUNCTION PANEL
ALBUMIN: 4.2 g/dL (ref 3.6–5.1)
ALT: 18 U/L (ref 9–46)
AST: 15 U/L (ref 10–35)
Alkaline Phosphatase: 85 U/L (ref 40–115)
Bilirubin, Direct: 0.1 mg/dL (ref <=0.2)
Indirect Bilirubin: 0.5 mg/dL (ref 0.2–1.2)
TOTAL PROTEIN: 6.9 g/dL (ref 6.1–8.1)
Total Bilirubin: 0.6 mg/dL (ref 0.2–1.2)

## 2016-12-09 LAB — POCT GLYCOSYLATED HEMOGLOBIN (HGB A1C): Hemoglobin A1C: 6

## 2016-12-09 NOTE — Progress Notes (Signed)
Subjective:     Patient ID: Jason [name], male   DOB: 05-22-1966, 51 y.o.   MRN: [id number]  HPI Patient seen for follow-up regarding multiple medical problems. History of B12 deficiency, alcohol abuse, nicotine use, osteoarthritis, dyslipidemia, hyperglycemia, hypertension. He had recent stay at [facility] for alcohol detox. He has done well and is maintained on Naltrexone- though he has not been taking this consistently. He has not had any relapse thus far. He declined further outpatient counseling. Still smokes. Feels much better overall. Diet has improved.  Recent B12 deficiency with level 191. He is taking daily oral replacement. Last A1c 6.2%. He also had recent slightly elevated TSH 4.82. No fatigue issues.  Major complaint is general arthralgias. These are worse usually late in the day after working all day after he sits for several minutes and then first gets up. He has stiffness and achiness in multiple joints including knees, ankles, hands. Has not seen visible swelling.  Past Medical History:  Diagnosis Date  . Anxiety   . Depression   . GERD (gastroesophageal reflux disease)   . Headache(784.0)   . Hyperlipidemia   . Hypertension   . Osteoarthritis    Past Surgical History:  Procedure Laterality Date  . ROTATOR CUFF REPAIR      reports that he has been smoking Cigarettes.  He has a 50.00 pack-year smoking history. He has never used smokeless tobacco. He reports that he drinks about 12.0 oz of alcohol per week . He reports that he does not use drugs. family history includes Arthritis in his mother; Asthma in his maternal grandfather; Heart attack (age of onset: 51) in his father; Heart disease in his other; Hypertension in his father; Mental illness in his other; Stroke in his paternal grandfather. Allergies  Allergen Reactions  . Olmesartan Medoxomil     REACTION: dizziness     Review of Systems  Constitutional: Negative for appetite change, fatigue and  unexpected weight change.  Eyes: Negative for visual disturbance.  Respiratory: Negative for cough, chest tightness and shortness of breath.   Cardiovascular: Negative for chest pain, palpitations and leg swelling.  Gastrointestinal: Negative for abdominal pain.  Musculoskeletal: Positive for arthralgias.  Neurological: Negative for dizziness, syncope, weakness, light-headedness and headaches.       Objective:   Physical Exam  Constitutional: He is oriented to person, place, and time. He appears well-developed and well-nourished.  HENT:  Right Ear: External ear normal.  Left Ear: External ear normal.  Mouth/Throat: Oropharynx is clear and moist.  Eyes: Pupils are equal, round, and reactive to light.  Neck: Neck supple. No thyromegaly present.  Cardiovascular: Normal rate and regular rhythm.  Exam reveals no friction rub.   Pulmonary/Chest: Effort normal and breath sounds normal. No respiratory distress. He has no wheezes. He has no rales.  Musculoskeletal: He exhibits no edema.  Neurological: He is alert and oriented to person, place, and time. No cranial nerve deficit.       Assessment:     #1 history of alcohol abuse now abstinent over the past couple months following acute alcohol detox  #2 hypertension stable and at goal  #3 mixed dyslipidemia  #4 history of B12 deficiency probably related to poor dietary intake previously with his alcoholism  #5 hyperglycemia. Repeat A1c today 6.0%  #6 osteoarthritis involving multiple joints     Plan:     -Recheck labs today with TSH, A1c as above, B12 level, lipid panel, hepatic panel -Avoid regular use of non-steroidals -  We discussed alcohol abstinence. He declines outpatient counseling -Recommend routine follow-up in 6 months  [name] MD [facility] at [facility]

## 2016-12-09 NOTE — Progress Notes (Signed)
Pre visit review using our clinic review tool, if applicable. No additional management support is needed unless otherwise documented below in the visit note. 

## 2016-12-10 LAB — VITAMIN B12: Vitamin B-12: 476 pg/mL (ref 200–1100)

## 2016-12-10 LAB — TSH: TSH: 1.15 mIU/L (ref 0.40–4.50)

## 2016-12-26 ENCOUNTER — Ambulatory Visit (INDEPENDENT_AMBULATORY_CARE_PROVIDER_SITE_OTHER): Payer: BLUE CROSS/BLUE SHIELD | Admitting: Family Medicine

## 2016-12-26 ENCOUNTER — Encounter: Payer: Self-pay | Admitting: Family Medicine

## 2016-12-26 ENCOUNTER — Telehealth: Payer: Self-pay | Admitting: Family Medicine

## 2016-12-26 VITALS — BP 130/70 | HR 78 | Wt 204.0 lb

## 2016-12-26 DIAGNOSIS — E538 Deficiency of other specified B group vitamins: Secondary | ICD-10-CM

## 2016-12-26 DIAGNOSIS — G629 Polyneuropathy, unspecified: Secondary | ICD-10-CM

## 2016-12-26 MED ORDER — GABAPENTIN 300 MG PO CAPS: 300.0000 mg | ORAL_CAPSULE | Freq: Every day | ORAL | 3 refills | Status: DC

## 2016-12-26 NOTE — Progress Notes (Signed)
Subjective:     Patient ID: Jason [name], male   DOB: 1966/07/10, 51 y.o.   MRN: [id number]  HPI Patient seen with several month history of bilateral leg "tingling and burning" sensation which is worse at night. Symptoms are predominantly knees down. He has occasional numbness as well. He is not aware of any weakness. He has long history of alcohol abuse. He went to detox program couple months ago and has done very well since then and remains totally abstinent. Recent TSH normal. He did have low B12 of 191 and repeat couple weeks ago after oral replacement back up to 476. He had recent A1c 6.0%.  Bilateral leg pain which again is a burning sensation is worse especially at night and sometimes severe. Interfering with sleep. He tried ibuprofen without relief.  Severity sometimes 7-8/10.    His weight has come down but he attributes this to getting rid of alcohol calories. He is actually eating much better now. Feels well overall.  Past Medical History:  Diagnosis Date  . Anxiety   . Depression   . GERD (gastroesophageal reflux disease)   . Headache(784.0)   . Hyperlipidemia   . Hypertension   . Osteoarthritis    Past Surgical History:  Procedure Laterality Date  . ROTATOR CUFF REPAIR      reports that he has been smoking Cigarettes.  He has a 50.00 pack-year smoking history. He has never used smokeless tobacco. He reports that he drinks about 12.0 oz of alcohol per week . He reports that he does not use drugs. family history includes Arthritis in his mother; Asthma in his maternal grandfather; Heart attack (age of onset: 95) in his father; Heart disease in his other; Hypertension in his father; Mental illness in his other; Stroke in his paternal grandfather. Allergies  Allergen Reactions  . Olmesartan Medoxomil     REACTION: dizziness     Review of Systems  Constitutional: Negative for appetite change and unexpected weight change.  Eyes: Negative for visual disturbance.   Respiratory: Negative for shortness of breath.   Cardiovascular: Negative for chest pain.  Neurological: Positive for numbness. Negative for weakness.       Objective:   Physical Exam  Constitutional: He is oriented to person, place, and time. He appears well-developed and well-nourished. No distress.  HENT:  Right Ear: External ear normal.  Left Ear: External ear normal.  Mouth/Throat: Oropharynx is clear and moist.  Eyes: Pupils are equal, round, and reactive to light.  Neck: Neck supple. No thyromegaly present.  Cardiovascular: Normal rate, regular rhythm and normal heart sounds.   No murmur heard. Pulmonary/Chest: Effort normal and breath sounds normal. No respiratory distress. He has no wheezes. He has no rales.  Musculoskeletal: He exhibits no edema.  Neurological: He is alert and oriented to person, place, and time. He has normal reflexes. No cranial nerve deficit.  No focal muscle weakness of LEs.   Gait  Normal.   Normal sensory to touch. DTRs are symmetric and 2 +  Skin: No rash noted.       Assessment:     Patient presents with bilateral lower extremity predominantly sensory neuropathy. Long history of alcohol abuse. Suspect this may be related to alcohol-related neuropathy. He has had low B12 but this was corrected on most recent labs. He also has had previous elevated blood sugars but improving.    Plan:     -Check serum protein electrophoresis -Trial of gabapentin 300 mg daily at bedtime -Total  abstinence from alcohol -Reassess in 3 weeks and sooner as needed. He is cautioned about potential for sedation with gabapentin  [name] MD [facility] at [facility]

## 2016-12-26 NOTE — Telephone Encounter (Signed)
° ° ° °  Pt call to ask who they need to see for the pens and needles pain and numbness in his feet . Would like a call back

## 2016-12-26 NOTE — Telephone Encounter (Signed)
Appointment made 12/26/16 3:30 pm

## 2016-12-26 NOTE — Patient Instructions (Signed)
Peripheral Neuropathy Peripheral neuropathy is a type of nerve damage. It affects nerves that carry signals between the spinal cord and other parts of the body. These are called peripheral nerves. With peripheral neuropathy, one nerve or a group of nerves may be damaged. What are the causes? Many things can damage peripheral nerves. For some people with peripheral neuropathy, the cause is unknown. Some causes include:  Diabetes. This is the most common cause of peripheral neuropathy.  Injury to a nerve.  Pressure or stress on a nerve that lasts a long time.  Too little vitamin B. Alcoholism can lead to this.  Infections.  Autoimmune diseases, such as multiple sclerosis and systemic lupus erythematosus.  Inherited nerve diseases.  Some medicines, such as cancer drugs.  Toxic substances, such as lead and mercury.  Too little blood flowing to the legs.  Kidney disease.  Thyroid disease.  What are the signs or symptoms? Different people have different symptoms. The symptoms you have will depend on which of your nerves is damaged. Common symptoms include:  Loss of feeling (numbness) in the feet and hands.  Tingling in the feet and hands.  Pain that burns.  Very sensitive skin.  Weakness.  Not being able to move a part of the body (paralysis).  Muscle twitching.  Clumsiness or poor coordination.  Loss of balance.  Not being able to control your bladder.  Feeling dizzy.  Sexual problems.  How is this diagnosed? Peripheral neuropathy is a symptom, not a disease. Finding the cause of peripheral neuropathy can be hard. To figure that out, your health care provider will take a medical history and do a physical exam. A neurological exam will also be done. This involves checking things affected by your brain, spinal cord, and nerves (nervous system). For example, your health care provider will check your reflexes, how you move, and what you can feel. Other types of tests  may also be ordered, such as:  Blood tests.  A test of the fluid in your spinal cord.  Imaging tests, such as CT scans or an MRI.  Electromyography (EMG). This test checks the nerves that control muscles.  Nerve conduction velocity tests. These tests check how fast messages pass through your nerves.  Nerve biopsy. A small piece of nerve is removed. It is then checked under a microscope.  How is this treated?  Medicine is often used to treat peripheral neuropathy. Medicines may include: ? Pain-relieving medicines. Prescription or over-the-counter medicine may be suggested. ? Antiseizure medicine. This may be used for pain. ? Antidepressants. These also may help ease pain from neuropathy. ? Lidocaine. This is a numbing medicine. You might wear a patch or be given a shot. ? Mexiletine. This medicine is typically used to help control irregular heart rhythms.  Surgery. Surgery may be needed to relieve pressure on a nerve or to destroy a nerve that is causing pain.  Physical therapy to help movement.  Assistive devices to help movement. Follow these instructions at home:  Only take over-the-counter or prescription medicines as directed by your health care provider. Follow the instructions carefully for any given medicines. Do not take any other medicines without first getting approval from your health care provider.  If you have diabetes, work closely with your health care provider to keep your blood sugar under control.  If you have numbness in your feet: ? Check every day for signs of injury or infection. Watch for redness, warmth, and swelling. ? Wear padded socks and comfortable   shoes. These help protect your feet.  Do not do things that put pressure on your damaged nerve.  Do not smoke. Smoking keeps blood from getting to damaged nerves.  Avoid or limit alcohol. Too much alcohol can cause a lack of B vitamins. These vitamins are needed for healthy nerves.  Develop a good  support system. Coping with peripheral neuropathy can be stressful. Talk to a mental health specialist or join a support group if you are struggling.  Follow up with your health care provider as directed. Contact a health care provider if:  You have new signs or symptoms of peripheral neuropathy.  You are struggling emotionally from dealing with peripheral neuropathy.  You have a fever. Get help right away if:  You have an injury or infection that is not healing.  You feel very dizzy or begin vomiting.  You have chest pain.  You have trouble breathing. This information is not intended to replace advice given to you by your health care provider. Make sure you discuss any questions you have with your health care provider. Document Released: 08/05/2002 Document Revised: 01/21/2016 Document Reviewed: 04/22/2013 Elsevier Interactive Patient Education  2017 Elsevier Inc.  

## 2016-12-26 NOTE — Progress Notes (Signed)
Pre visit review using our clinic review tool, if applicable. No additional management support is needed unless otherwise documented below in the visit note. 

## 2016-12-27 LAB — PROTEIN ELECTROPHORESIS, SERUM
ABNORMAL PROTEIN BAND1: 0.2 g/dL
ALPHA-1-GLOBULIN: 0.3 g/dL (ref 0.2–0.3)
Albumin ELP: 4.3 g/dL (ref 3.8–4.8)
Alpha-2-Globulin: 0.8 g/dL (ref 0.5–0.9)
Beta 2: 0.4 g/dL (ref 0.2–0.5)
Beta Globulin: 0.4 g/dL (ref 0.4–0.6)
GAMMA GLOBULIN: 0.8 g/dL (ref 0.8–1.7)
TOTAL PROTEIN, SERUM ELECTROPHOR: 7 g/dL (ref 6.1–8.1)

## 2017-01-13 ENCOUNTER — Ambulatory Visit (INDEPENDENT_AMBULATORY_CARE_PROVIDER_SITE_OTHER): Payer: BLUE CROSS/BLUE SHIELD | Admitting: Family Medicine

## 2017-01-13 ENCOUNTER — Encounter: Payer: Self-pay | Admitting: Family Medicine

## 2017-01-13 VITALS — BP 120/60 | HR 72 | Wt 208.4 lb

## 2017-01-13 DIAGNOSIS — Z87898 Personal history of other specified conditions: Secondary | ICD-10-CM | POA: Diagnosis not present

## 2017-01-13 DIAGNOSIS — G549 Nerve root and plexus disorder, unspecified: Secondary | ICD-10-CM

## 2017-01-13 DIAGNOSIS — F1011 Alcohol abuse, in remission: Secondary | ICD-10-CM

## 2017-01-13 MED ORDER — GABAPENTIN 300 MG PO CAPS: ORAL_CAPSULE | ORAL | 3 refills | Status: DC

## 2017-01-13 NOTE — Progress Notes (Signed)
Subjective:     Patient ID: Jason [name], male   DOB: 02-Apr-1966, 51 y.o.   MRN: [id number]  HPI Patient seen for follow-up regarding his bilateral lower extremity neuropathy symptoms. Refer to previous note. Suspected related to long hx of alcohol abuse. Previous low B12 but corrected. History of mildly elevated blood sugars but most recent A1c 6.0%. Serum protein electrophoresis showed nonspecific protein band. We started gabapentin 300 mg at night. He did note some improvement. His pain was 8 out of 10 without medication and down to 6 out of 10 and more recently he has taken up to 2 at night which has helped. He has not taken any daytime doses. His symptoms are worse at night.  He remains abstinent from alcohol. He declined maintenance programs  Past Medical History:  Diagnosis Date  . Anxiety   . Depression   . GERD (gastroesophageal reflux disease)   . Headache(784.0)   . Hyperlipidemia   . Hypertension   . Osteoarthritis    Past Surgical History:  Procedure Laterality Date  . ROTATOR CUFF REPAIR      reports that he has been smoking Cigarettes.  He has a 50.00 pack-year smoking history. He has never used smokeless tobacco. He reports that he drinks about 12.0 oz of alcohol per week . He reports that he does not use drugs. family history includes Arthritis in his mother; Asthma in his maternal grandfather; Heart attack (age of onset: 8748) in his father; Heart disease in his other; Hypertension in his father; Mental illness in his other; Stroke in his paternal grandfather. Allergies  Allergen Reactions  . Olmesartan Medoxomil     REACTION: dizziness     Review of Systems  Constitutional: Negative for fatigue.  Eyes: Negative for visual disturbance.  Respiratory: Negative for cough, chest tightness and shortness of breath.   Cardiovascular: Negative for chest pain, palpitations and leg swelling.  Neurological: Negative for dizziness, syncope, weakness, light-headedness and  headaches.       Objective:   Physical Exam  Constitutional: He is oriented to person, place, and time. He appears well-developed and well-nourished.  HENT:  Right Ear: External ear normal.  Left Ear: External ear normal.  Mouth/Throat: Oropharynx is clear and moist.  Eyes: Pupils are equal, round, and reactive to light.  Neck: Neck supple. No thyromegaly present.  Cardiovascular: Normal rate and regular rhythm.   Pulmonary/Chest: Effort normal and breath sounds normal. No respiratory distress. He has no wheezes. He has no rales.  Musculoskeletal: He exhibits no edema.  Neurological: He is alert and oriented to person, place, and time.       Assessment:     #1 Long standing history of alcohol abuse  #2 bilateral predominantly sensory neuropathy. Suspect related to alcohol abuse      Plan:     -Increase gabapentin 300 mg to 2 at night. May add daytime dose if daytime symptoms becoming more bothersome -Continue to maintain abstinence from alcohol -Continue B12 supplementation -Keep sugar and starch intake down -Reassess in 3 months and repeat A1c then  Kristian CoveyBruce W Lylah Lantis MD [facility]

## 2017-01-13 NOTE — Patient Instructions (Signed)
Go ahead and increase Gabapentin to two at night and add one in morning if needed.

## 2017-01-14 DIAGNOSIS — G549 Nerve root and plexus disorder, unspecified: Secondary | ICD-10-CM | POA: Insufficient documentation

## 2017-02-10 DIAGNOSIS — M25561 Pain in right knee: Secondary | ICD-10-CM | POA: Diagnosis not present

## 2017-02-10 DIAGNOSIS — M25511 Pain in right shoulder: Secondary | ICD-10-CM | POA: Diagnosis not present

## 2017-02-10 DIAGNOSIS — M25512 Pain in left shoulder: Secondary | ICD-10-CM | POA: Diagnosis not present

## 2017-02-10 DIAGNOSIS — M170 Bilateral primary osteoarthritis of knee: Secondary | ICD-10-CM | POA: Diagnosis not present

## 2017-02-10 DIAGNOSIS — M25562 Pain in left knee: Secondary | ICD-10-CM | POA: Diagnosis not present

## 2017-03-24 ENCOUNTER — Other Ambulatory Visit: Payer: Self-pay | Admitting: *Deleted

## 2017-03-24 MED ORDER — GABAPENTIN 300 MG PO CAPS: ORAL_CAPSULE | ORAL | 1 refills | Status: DC

## 2017-03-24 NOTE — Telephone Encounter (Signed)
Rx done. 

## 2017-04-21 ENCOUNTER — Ambulatory Visit (INDEPENDENT_AMBULATORY_CARE_PROVIDER_SITE_OTHER): Payer: BLUE CROSS/BLUE SHIELD | Admitting: Family Medicine

## 2017-04-21 ENCOUNTER — Encounter: Payer: Self-pay | Admitting: Family Medicine

## 2017-04-21 VITALS — BP 110/64 | HR 75 | Temp 98.5°F | Wt 217.2 lb

## 2017-04-21 DIAGNOSIS — L02212 Cutaneous abscess of back [any part, except buttock]: Secondary | ICD-10-CM | POA: Diagnosis not present

## 2017-04-21 DIAGNOSIS — L02232 Carbuncle of back [any part, except buttock]: Secondary | ICD-10-CM

## 2017-04-21 DIAGNOSIS — L02222 Furuncle of back [any part, except buttock]: Secondary | ICD-10-CM

## 2017-04-21 NOTE — Progress Notes (Signed)
Subjective:     Patient ID: Jason [name], male   DOB: 1966/07/31, 51 y.o.   MRN: [id number]  HPI Patient seen with abscess area right lower back. First started several days ago. He states that over the past few months he's had at least 3 other superficial smaller abscesses that have eventually resolved spontaneously. No history of known MRSA. Current spot on his right lower back is very painful and gradually enlarging over several days. No fevers or chills. Minimal drainage.  Patient has past history of alcohol abuse. He's been off alcohol now completely for several months and maintaining abstinence. He has history of neuropathy probably related to his alcohol abuse. Currently taking gabapentin 300 mg 3 tablets twice daily and this is controlling his pain well. He would like to consider 800 mg tablet twice daily when he is due for refills.  Past Medical History:  Diagnosis Date  . Anxiety   . Depression   . GERD (gastroesophageal reflux disease)   . Headache(784.0)   . Hyperlipidemia   . Hypertension   . Osteoarthritis    Past Surgical History:  Procedure Laterality Date  . ROTATOR CUFF REPAIR      reports that he has been smoking Cigarettes.  He has a 50.00 pack-year smoking history. He has never used smokeless tobacco. He reports that he drinks about 12.0 oz of alcohol per week . He reports that he does not use drugs. family history includes Arthritis in his mother; Asthma in his maternal grandfather; Heart attack (age of onset: 60) in his father; Heart disease in his other; Hypertension in his father; Mental illness in his other; Stroke in his paternal grandfather. Allergies  Allergen Reactions  . Olmesartan Medoxomil     REACTION: dizziness     Review of Systems  Constitutional: Negative for chills and fever.       Objective:   Physical Exam  Constitutional: He appears well-developed and well-nourished.  Cardiovascular: Normal rate and regular rhythm.    Pulmonary/Chest: Effort normal and breath sounds normal. No respiratory distress. He has no wheezes. He has no rales.  Skin:  Patient has area of erythema about 3 x 3 cm and near the center he has some fluctuance and tenderness       Assessment:     #1 Abscess right lower back  #2 Neuropathy lower extremities probably related to history of alcohol abuse    Plan:     -We recommended incision and drainage of abscess right lower back. Patient consented. Anesthesia with 1% plain Xylocaine. Using #11 blade made a linear incision approximately 2 cm in length over the area fluctuance and express significant amount of pus. We used curved hemostats to free up some loculations of pus. Wound cavity packed with iodoform gauze and outer dressing applied. Minimal bleeding.  Pt tolerated well. -Returned by Monday for packing removal and try to keep dry in the meantime  [name] MD [facility] at [facility]

## 2017-04-21 NOTE — Patient Instructions (Signed)

## 2017-04-24 ENCOUNTER — Ambulatory Visit (INDEPENDENT_AMBULATORY_CARE_PROVIDER_SITE_OTHER): Payer: BLUE CROSS/BLUE SHIELD | Admitting: Family Medicine

## 2017-04-24 ENCOUNTER — Encounter: Payer: Self-pay | Admitting: Family Medicine

## 2017-04-24 VITALS — BP 110/70 | HR 81 | Temp 98.0°F | Wt 216.9 lb

## 2017-04-24 DIAGNOSIS — A4902 Methicillin resistant Staphylococcus aureus infection, unspecified site: Secondary | ICD-10-CM | POA: Diagnosis not present

## 2017-04-24 DIAGNOSIS — L02212 Cutaneous abscess of back [any part, except buttock]: Secondary | ICD-10-CM

## 2017-04-24 LAB — WOUND CULTURE: GRAM STAIN: NONE SEEN

## 2017-04-24 MED ORDER — DOXYCYCLINE HYCLATE 100 MG PO CAPS: 100.0000 mg | ORAL_CAPSULE | Freq: Two times a day (BID) | ORAL | 2 refills | Status: DC

## 2017-04-24 MED ORDER — MUPIROCIN 2 % EX OINT: 1.0000 "application " | TOPICAL_OINTMENT | Freq: Two times a day (BID) | CUTANEOUS | 1 refills | Status: DC

## 2017-04-24 NOTE — Patient Instructions (Signed)
Community-Associated MRSA °MRSA stands for methicillin-resistant Staphylococcus aureus. It is a type ofinfection caused by bacteria that are no longer affected by common antibiotic medicines (drug-resistant bacteria). °Infections with MRSA can occur in hospitals and other health care settings (healthcare-associated MRSA), and in the community (community-associated MRSA, or CA-MRSA). MRSA bacteria can spread from person to person by touching contaminated objects or through direct contact. Infections with MRSA may be very serious or even life-threatening. °What are the causes? °Staphylococcus aureus bacteria normally live on the skin or in the nose of some people. This usually does not cause problems. However, a MRSA infection can happen if the bacterium enters the body through a cut, wound, or break in the skin. °What increases the risk? °The following factors may make you more likely to get a CA-MRSA infection: °· Close skin-to-skin contact with others. °· Cuts and scratches that are not treated, not covered, or both. °· Recent antibiotic medicine use. °· Sharing contaminated towels or clothes. °· Having active skin conditions. °· Participating in contact sports. °· Living in crowded settings. °· Homelessness. °· IV drug use. ° °What are the signs or symptoms? °This condition usually starts with a skin infection. Signs and symptoms vary, and may include: °· An area of skin that is red and swollen, feels painful, and is warm to the touch. °· Pus under the skin or pus draining from the infected area. °· Fever. ° °CA-MRSA infections are usually skin infections, but in some cases, severe illness may develop, such as: °· Pneumonia. °· Bone or joint infections. °· Bloodstream infections (sepsis). ° °Symptoms may vary as the infection gets worse. °How is this diagnosed? °This condition is diagnosed by taking a fluid sample (culture). The culture may come from: °· Swabs of cuts or wounds in infected areas. °· Nasal  swabs. °· Saliva or deep-cough specimens from the lungs (sputum). °· Urine. °· Blood. ° °You may have imaging tests to check whether the infection has spread. Imaging tests may include: °· X-rays. °· MRI. °· CT scan. ° °How is this treated? °Treatment varies and is based on how serious, deep, and extensive the infection is. For example: °· Some skin infections, such as a small boil or abscess, may be treated by draining pus from the site of the infection. °· Deeper or more widespread soft tissue infections are usually treated with surgery to drain pus and with antibiotics that are given through a vein or by mouth. This may be recommended even if you are pregnant. °· Serious infections may require a hospital stay. ° °If antibiotics are prescribed, you may need to take them for several weeks. °Follow these instructions at home: °Medicines °· Take your antibiotic as told by your health care provider. Do not stop taking the antibiotic even if you start to feel better. °· Take over-the-counter and prescription medicines only as told by your health care provider. °General instructions °· Wash your hands with soap and water often. Ask anyone who lives with you to wash their hands often, too. If soap and water are not available, use hand sanitizer. °· Do not use towels, razors, toothbrushes, bedding, or other items that will be used by others. °· Follow instructions from your health care provider about how to take care of your wound. Make sure you: °? Wash your hands with soap and water before you change your bandage (dressing). If soap and water are not available, use hand sanitizer. °? Change your dressing as told by your health care provider. °? Leave   stitches (sutures), skin glue, or adhesive strips in place. These skin closures may need to be in place for 2 weeks or longer. If adhesive strip edges start to loosen and curl up, you may trim the loose edges. Do not remove adhesive strips completely unless your health care  provider tells you to do that. °· Tell any health care providers who care for you that you have MRSA so they are aware of your infection. °· Keep all follow-up visits as told by your health care provider. This is important. °How is this prevented? ° °· Wash your hands frequently with soap and water for at least 20 seconds. If soap and water are not available, use hand sanitizer. Make sure that everyone in your household washes their hands, too. °· Wash and dry your clothes and bedding at the warmest temperatures that are recommended on the labels. °· Maintain good hygiene by bathing often and keeping your body clean. °· Clean wounds, cuts, and abrasions with soap and water and cover them with dry, germ-free (sterile) dressings until they heal. °· If you have a wound that seems to be infected, ask your health care provider if a culture should be done for MRSA and other bacteria. °· If you are breastfeeding, talk to your health care provider about MRSA. You may be asked to temporarily stop breastfeeding. °Contact a health care provider if: °· Your infection seems to be getting worse. Signs may include: °? More warmth, redness, or tenderness around your wound site. °? A red line that spreads from your infection site. °? A dark color in the area around your infection. °? Wound drainage that is tan, yellow, or green. °? A bad smell coming from your wound. °· You feel nauseous, you vomit, or you cannot take medicine without vomiting. °· You have a fever. °· You have difficulty breathing. °This information is not intended to replace advice given to you by your health care provider. Make sure you discuss any questions you have with your health care provider. °Document Released: 11/18/2005 Document Revised: 04/08/2016 Document Reviewed: 08/20/2015 °Elsevier Interactive Patient Education © 2017 Elsevier Inc. ° °

## 2017-04-24 NOTE — Progress Notes (Signed)
Subjective:     Patient ID: Jason [name], male   DOB: 08-Sep-1965, 51 y.o.   MRN: [id number]  HPI Patient seen for follow-up regarding abscess of his right lower back region. He also described a few other similar but smaller type abscesses which self resolved over the few months prior to this. We obtained culture and this came back positive for MRSA. He still has some soreness in his lower back but denies any fevers or chills. He has noticed some drainage over the weekend. Here today for packing removal and further assessment  Past Medical History:  Diagnosis Date  . Anxiety   . Depression   . GERD (gastroesophageal reflux disease)   . Headache(784.0)   . Hyperlipidemia   . Hypertension   . Osteoarthritis    Past Surgical History:  Procedure Laterality Date  . ROTATOR CUFF REPAIR      reports that he has been smoking Cigarettes.  He has a 50.00 pack-year smoking history. He has never used smokeless tobacco. He reports that he drinks about 12.0 oz of alcohol per week . He reports that he does not use drugs. family history includes Arthritis in his mother; Asthma in his maternal grandfather; Heart attack (age of onset: 90) in his father; Heart disease in his other; Hypertension in his father; Mental illness in his other; Stroke in his paternal grandfather. Allergies  Allergen Reactions  . Olmesartan Medoxomil     REACTION: dizziness     Review of Systems  Constitutional: Negative for chills and fever.       Objective:   Physical Exam  Constitutional: He appears well-developed and well-nourished.  Skin:  Wound right lower back examined. Packing is removed. He still has some purulent drainage but overall improved. Much less surrounding erythema. No fluctuance.       Assessment:     Abscess right lower back with recent incision drainage and packing  History of recurrent abscesses with positive MRSA culture as above    Plan:     -Packing removed. We recommended  repacking cavity because of persistence of some purulence and patient consented. We anesthetized with 1% plain Xylocaine and pack the wound cavity with quarter inch iodoform gauze. Patient tolerated well. Allergen dressing applied -Recommend consideration for Bactroban ointment intranasal twice daily for 5 days given recurrence of abscesses -Start doxycycline 100 mg twice daily for 10 days -Reassess in 2 days and sooner as needed  [name] MD [facility] at [facility]

## 2017-04-26 ENCOUNTER — Encounter: Payer: Self-pay | Admitting: Family Medicine

## 2017-04-26 ENCOUNTER — Ambulatory Visit (INDEPENDENT_AMBULATORY_CARE_PROVIDER_SITE_OTHER): Payer: BLUE CROSS/BLUE SHIELD | Admitting: Family Medicine

## 2017-04-26 VITALS — BP 110/80 | HR 76 | Temp 98.4°F | Wt 217.1 lb

## 2017-04-26 DIAGNOSIS — L02212 Cutaneous abscess of back [any part, except buttock]: Secondary | ICD-10-CM

## 2017-04-26 DIAGNOSIS — M25562 Pain in left knee: Secondary | ICD-10-CM

## 2017-04-26 DIAGNOSIS — I10 Essential (primary) hypertension: Secondary | ICD-10-CM

## 2017-04-26 DIAGNOSIS — M25561 Pain in right knee: Secondary | ICD-10-CM

## 2017-04-26 NOTE — Progress Notes (Signed)
Subjective:     Patient ID: Jason [name], male   DOB: Sep 29, 1965, 51 y.o.   MRN: [id number]  HPI Patient seen for follow-up regarding right lower back abscess and also for additional problem as below.  We performed incision and drainage several days ago and this is slowly improving in terms of pain. His culture grew out MRSA. He is on doxycycline. No fevers or chills. He had recent history of recurrent skin abscesses. He also has started intranasal Bactroban ointment twice daily for 5 days. We repacked his wound couple days ago.  Using Dial soap.    Patient complains of bilateral knee pain. Duration-years.He saw orthopedist couple months ago. Started on Indocin 25 mg twice a day with food but has not seen any improvement. He has somewhat poorly localized knee pain which sometimes worse after prolonged periods of sitting and first getting up. No warmth. He states x-rays were done and showedleft arthritic change.  Past Medical History:  Diagnosis Date  . Anxiety   . Depression   . GERD (gastroesophageal reflux disease)   . Headache(784.0)   . Hyperlipidemia   . Hypertension   . Osteoarthritis    Past Surgical History:  Procedure Laterality Date  . ROTATOR CUFF REPAIR      reports that he has been smoking Cigarettes.  He has a 50.00 pack-year smoking history. He has never used smokeless tobacco. He reports that he drinks about 12.0 oz of alcohol per week . He reports that he does not use drugs. family history includes Arthritis in his mother; Asthma in his maternal grandfather; Heart attack (age of onset: 7948) in his father; Heart disease in his other; Hypertension in his father; Mental illness in his other; Stroke in his paternal grandfather. Allergies  Allergen Reactions  . Olmesartan Medoxomil     REACTION: dizziness     Review of Systems  Constitutional: Negative for chills and fever.  Musculoskeletal: Positive for arthralgias.       Objective:   Physical Exam   Constitutional: He appears well-developed and well-nourished.  Cardiovascular: Normal rate and regular rhythm.   Musculoskeletal:  Knees reveal no effusion. No warmth. No erythema.  Mild medial joint tenderness bilaterally  Skin:  Packing is removed from right lower back abscess. Cavity of wound appears good with no further purulent drainage. Good granulation tissue. Surrounding cellulitis changes have almost fully resolved       Assessment:     #1 right lower back abscess. Improved following recent incision and drainage. Culture positive for MRSA  #2 bilateral knee pain. Question patellofemoral syndrome    Plan:     -We discussed our concern about him being on long-term nonsteroidals especially with his past history of alcohol abuse and long-term GERD symptoms. Also he is not improved on Indocin -Recommend consideration for sports medicine referral for further evaluation -Packing removed from wound in no indication to repacked today. -Clean daily with soap and water and keep covered until fully healed -Follow-up promptly for any recurrent redness, drainage, or other concerns  Kristian CoveyBruce W Quention Mcneill MD [facility]

## 2017-04-26 NOTE — Patient Instructions (Signed)
I will set up referral to sports medicine for your knee pain.

## 2017-05-02 ENCOUNTER — Encounter: Payer: Self-pay | Admitting: Sports Medicine

## 2017-05-02 ENCOUNTER — Ambulatory Visit (INDEPENDENT_AMBULATORY_CARE_PROVIDER_SITE_OTHER): Payer: BLUE CROSS/BLUE SHIELD | Admitting: Sports Medicine

## 2017-05-02 VITALS — BP 116/66 | HR 72 | Ht 70.0 in | Wt 215.2 lb

## 2017-05-02 DIAGNOSIS — M25562 Pain in left knee: Secondary | ICD-10-CM

## 2017-05-02 DIAGNOSIS — M170 Bilateral primary osteoarthritis of knee: Secondary | ICD-10-CM

## 2017-05-02 DIAGNOSIS — A4902 Methicillin resistant Staphylococcus aureus infection, unspecified site: Secondary | ICD-10-CM

## 2017-05-02 DIAGNOSIS — G629 Polyneuropathy, unspecified: Secondary | ICD-10-CM | POA: Diagnosis not present

## 2017-05-02 DIAGNOSIS — M25561 Pain in right knee: Secondary | ICD-10-CM | POA: Diagnosis not present

## 2017-05-02 DIAGNOSIS — L02212 Cutaneous abscess of back [any part, except buttock]: Secondary | ICD-10-CM | POA: Diagnosis not present

## 2017-05-02 MED ORDER — IBUPROFEN-FAMOTIDINE 800-26.6 MG PO TABS: ORAL_TABLET | ORAL | 2 refills | Status: DC

## 2017-05-02 NOTE — Patient Instructions (Addendum)
We provided you to full knee Body Helix sleeves to be worn when you are active.  You can use the compression sleeve at any time throughout the day but is most important to use while being active as well as for 2 hours post-activity.   It is appropriate to ice following activity with the compression sleeve in place.  I am okay with you trying to do some jogging but would recommend that she do no more than 1 minute of jogging and 1 minute of walking for up to 5-10 minutes to begin with.  As long as you tolerate this well we can discuss at your follow-up further increasing this.

## 2017-05-02 NOTE — Progress Notes (Addendum)
OFFICE VISIT NOTE Jason [name]. [name] Sports Medicine [facility] at [facility] [phone]  MUHAMMADALI RIES - 51 y.o. male MRN [id number]  Date of birth: [birth year]  Visit Date: 05/02/2017  PCP: [name], MD   Referred by: [name], MD  Orlie Dakin, CMA acting as scribe for Dr. Berline Chough.  SUBJECTIVE:   Chief Complaint  Patient presents with  . New Patient (Initial Visit)    Bilateral knee pain   HPI: As below and per problem based documentation when appropriate.  Mr. Pellum is a new patient presenting today with complaint of bilateral knee pain. Pain has been present for several years. He was seen by Dr. Juliene Pina about 2 months ago and was prescribed Indocin which does help with the pain. He had xray of both knees done about 2 months ago.  Pt has hx of injury to both knees. He played Rugby as a child. He denies any surgery on the knees.   The pain is described as aching most of the time but stabbing when standing up. Pain is rated as 8/10 when climbing. He cannot pinpoint where the pain is, its just all around the knee. He feels warm sharp pains when his foot is planted and he turns. No recent swelling around the knee.   Worsened with climbing up ladders, going up and down stairs, driving, sitting for long periods of time then standing.  Improves with rest Therapies tried include Indocin, which provides some relief. He does not use heat or ice.   Other associated symptoms include: Pain radiates down leg into feet. Pt has neuropathy, he takes Gabapentin for this. He denies pain in ankles and hips but does c/o pain in both feet.     Review of Systems  Constitutional: Negative for chills and fever.  Respiratory: Negative for shortness of breath and wheezing.   Cardiovascular: Negative for chest pain, palpitations and leg swelling.  Musculoskeletal: Positive for joint pain. Negative for falls.  Neurological: Positive for tingling.  Negative for dizziness and headaches.  Endo/Heme/Allergies: Does not bruise/bleed easily.    Otherwise per HPI.  HISTORY & PERTINENT PRIOR DATA:  No specialty comments available. He reports that he has been smoking Cigarettes.  He has a 50.00 pack-year smoking history. He has never used smokeless tobacco.   Recent Labs  08/30/16 1721 12/09/16 1541  HGBA1C 6.5 6.0   Medications & Allergies reviewed per EMR Patient Active Problem List   Diagnosis Date Noted  . Primary osteoarthritis of both knees 05/02/2017  . Sensory neuronopathy 01/14/2017  . Transaminasemia 10/10/2016  . Vitamin B 12 deficiency 09/19/2016  . Hyperglycemia 09/19/2016  . Chest pressure 07/04/2012  . Cough 07/04/2012  . Hyperlipidemia 10/12/2010  . ANXIETY 10/12/2010  . History of alcohol abuse 10/12/2010  . TOBACCO USE 10/12/2010  . DEPRESSION 10/12/2010  . Essential hypertension, benign 10/12/2010  . GERD 10/12/2010  . Osteoarthritis 10/12/2010  . Sleep apnea 10/12/2010   Past Medical History:  Diagnosis Date  . Anxiety   . Depression   . GERD (gastroesophageal reflux disease)   . Headache(784.0)   . Hyperlipidemia   . Hypertension   . Osteoarthritis    Family History  Problem Relation Age of Onset  . Heart attack Father 79  . Hypertension Father   . Arthritis Mother   . Asthma Maternal Grandfather   . Stroke Paternal Grandfather   . Heart disease Other   . Mental illness Other   .  Cancer Neg Hx   . Diabetes Neg Hx   . Drug abuse Neg Hx   . Early death Neg Hx   . Hearing loss Neg Hx   . Hyperlipidemia Neg Hx    Past Surgical History:  Procedure Laterality Date  . ROTATOR CUFF REPAIR     Social History   Occupational History  . Not on file.   Social History Main Topics  . Smoking status: Current Every Day Smoker    Packs/day: 2.00    Years: 25.00    Types: Cigarettes  . Smokeless tobacco: Never Used  . Alcohol use 12.0 oz/week    20 Glasses of wine per week  . Drug use: No    . Sexual activity: Not Currently    OBJECTIVE:  VS:  HT:5\' 10"  (177.8 cm)   WT:215 lb 3.2 oz (97.6 kg)  BMI:30.88    BP:116/66  HR:72bpm  TEMP: ( )  RESP:95 % EXAM: Findings:  WDWN, NAD, Non-toxic appearing Alert & appropriately interactive Not depressed or anxious appearing No increased work of breathing. Pupils are equal. EOM intact without nystagmus No clubbing or cyanosis of the extremities appreciated No significant rashes/lesions/ulcerations overlying the examined area. DP PT pulses 1+/4.  Trace bilateral pretibial edema No clubbing or cyanosis Sensation intact to light touch in lower extremities.  Bilateral Knee: Overall joint is well aligned, no significant deformity.   Generalized synovitis without significant effusion today..   ROM: 2 to 110.Marland Kitchen   Extensor mechanism intact with poor VMO definition bilaterally. Generalized TTP across anterior aspect of the joint at both the medial and lateral joint lines.  No focal TTP..   Stable to varus/valgus strain & anterior/posterior drawer.    Pain with McMurray's with no reproducible mechanical symptoms..       No results found. ASSESSMENT & PLAN:     ICD-10-CM   1. Pain in both knees, unspecified chronicity M25.561 Ibuprofen-Famotidine (DUEXIS) 800-26.6 MG TABS   M25.562 Uric acid    Comprehensive metabolic panel  2. Abscess of lower back L02.212   3. MRSA infection A49.02   4. Polyneuropathy G62.9   5. Primary osteoarthritis of both knees M17.0 Ibuprofen-Famotidine (DUEXIS) 800-26.6 MG TABS    Uric acid    Comprehensive metabolic panel   Mild based on exam, x-rays through [facility] imaging  ================================================================= Primary osteoarthritis of both knees Patient has a moderate degree of synovitis today on exam but no significant effusion.  He does have what sounds as though mild to moderate osteoarthritis of bilateral knees with likely degenerative meniscal involvement.  We  discussed multiple options including repeat corticosteroid injections versus Visco supplementation versus continued conservative measure with compression, strengthening and prescription strength anti-inflammatories.  I would like for him to cycle on and off of the anti-inflammatories as he can tolerate and work on strengthening as outlined per AVS.    I am hesitant to inject his knees today given the recent MRSA infection and the fact that he has only minimal synovitis.  Recommend interventions as above and consideration of Visco supplementation.  Would consider Synvisc 1.  We will go ahead and check him for gout as well as this is a low likelihood but given his former drinking does put him at risk.  ================================================================= Patient Instructions  We provided you to full knee Body Helix sleeves to be worn when you are active.  You can use the compression sleeve at any time throughout the day but is most important to use while being active  as well as for 2 hours post-activity.   It is appropriate to ice following activity with the compression sleeve in place.  I am okay with you trying to do some jogging but would recommend that she do no more than 1 minute of jogging and 1 minute of walking for up to 5-10 minutes to begin with.  As long as you tolerate this well we can discuss at your follow-up further increasing this.    =================================================================   Follow-up: Return in about 8 weeks (around 06/27/2017).   CMA/ATC served as Neurosurgeonscribe during this visit. History, Physical, and Plan performed by medical provider. Documentation and orders reviewed and attested to.      Gaspar BiddingMichael Rigby, DO    [facility]

## 2017-05-02 NOTE — Addendum Note (Signed)
Addended by: Gaspar BiddingIGBY, Tyffani Foglesong D on: 05/02/2017 03:45 PM   Modules accepted: Level of Service

## 2017-05-02 NOTE — Assessment & Plan Note (Addendum)
Patient has a moderate degree of synovitis today on exam but no significant effusion.  He does have what sounds as though mild to moderate osteoarthritis of bilateral knees with likely degenerative meniscal involvement.  We discussed multiple options including repeat corticosteroid injections versus Visco supplementation versus continued conservative measure with compression, strengthening and prescription strength anti-inflammatories.  I would like for him to cycle on and off of the anti-inflammatories as he can tolerate and work on strengthening as outlined per AVS.    I am hesitant to inject his knees today given the recent MRSA infection and the fact that he has only minimal synovitis.  Recommend interventions as above and consideration of Visco supplementation.  Would consider Synvisc 1.  We will go ahead and check him for gout as well as this is a low likelihood but given his former drinking does put him at risk.

## 2017-05-03 LAB — COMPREHENSIVE METABOLIC PANEL
ALK PHOS: 93 U/L (ref 39–117)
ALT: 20 U/L (ref 0–53)
AST: 15 U/L (ref 0–37)
Albumin: 4.6 g/dL (ref 3.5–5.2)
BUN: 16 mg/dL (ref 6–23)
CHLORIDE: 102 meq/L (ref 96–112)
CO2: 27 meq/L (ref 19–32)
Calcium: 9.8 mg/dL (ref 8.4–10.5)
Creatinine, Ser: 0.89 mg/dL (ref 0.40–1.50)
GFR: 95.84 mL/min (ref >60.00)
GLUCOSE: 201 mg/dL — AB (ref 70–99)
POTASSIUM: 4.2 meq/L (ref 3.5–5.1)
SODIUM: 138 meq/L (ref 135–145)
TOTAL PROTEIN: 6.8 g/dL (ref 6.0–8.3)
Total Bilirubin: 0.6 mg/dL (ref 0.2–1.2)

## 2017-05-03 LAB — URIC ACID: URIC ACID, SERUM: 4.7 mg/dL (ref 4.0–7.8)

## 2017-05-04 ENCOUNTER — Telehealth: Payer: Self-pay | Admitting: Sports Medicine

## 2017-05-04 NOTE — Telephone Encounter (Signed)
[place], since you are triage could you please resend this medication into the pharmacy for Dr. Janeece Riggersigby's patient since his staff is gone for the day.  MEDICATION: Ibuprofen-Famotidine    PHARMACY:  [id number] - [place], [place] - [address] [place] [id number] Phone: [phone] Fax: [phone]     IS THIS A 90 DAY SUPPLY : n/a  IS PATIENT OUT OF MEDICATION: yes, never recieved  IF NOT; HOW MUCH IS LEFT: n/a  LAST APPOINTMENT DATE:05/02/17  NEXT APPOINTMENT DATE:06/27/17  OTHER COMMENTS: the pharmacy did not have power when the rx was sent, actually was sent to the incorrect pharmacy in the beginning.    **Let patient know to contact pharmacy at the end of the day to make sure medication is ready. **  ** Please notify patient to allow 48-72 hours to process**  **Encourage patient to contact the pharmacy for refills or they can [facility]**

## 2017-05-04 NOTE — Telephone Encounter (Signed)
Called and spoke with pharmacy. [facility] states that medication has not been filled because they were unable to reach patient and verify allergies, insurance and address. Provided pharmacy with information which she informed me that there will 10 dollar. Called and informed patient that he should be receiving a call. Understanding verbalized nothing further needed at this time.

## 2017-05-05 NOTE — Telephone Encounter (Signed)
noted 

## 2017-05-18 ENCOUNTER — Encounter: Payer: Self-pay | Admitting: Family Medicine

## 2017-06-16 ENCOUNTER — Other Ambulatory Visit: Payer: Self-pay

## 2017-06-16 MED ORDER — GABAPENTIN 300 MG PO CAPS: ORAL_CAPSULE | ORAL | 1 refills | Status: DC

## 2017-06-23 ENCOUNTER — Encounter: Payer: Self-pay | Admitting: Family Medicine

## 2017-06-26 NOTE — Telephone Encounter (Signed)
Left message on machine for patient to return our call 

## 2017-06-27 ENCOUNTER — Ambulatory Visit (INDEPENDENT_AMBULATORY_CARE_PROVIDER_SITE_OTHER): Payer: BLUE CROSS/BLUE SHIELD

## 2017-06-27 ENCOUNTER — Ambulatory Visit (INDEPENDENT_AMBULATORY_CARE_PROVIDER_SITE_OTHER): Payer: BLUE CROSS/BLUE SHIELD | Admitting: Sports Medicine

## 2017-06-27 ENCOUNTER — Encounter: Payer: Self-pay | Admitting: Sports Medicine

## 2017-06-27 VITALS — BP 140/74 | HR 73 | Ht 70.0 in | Wt 214.8 lb

## 2017-06-27 DIAGNOSIS — M170 Bilateral primary osteoarthritis of knee: Secondary | ICD-10-CM

## 2017-06-27 DIAGNOSIS — M2550 Pain in unspecified joint: Secondary | ICD-10-CM

## 2017-06-27 DIAGNOSIS — F17200 Nicotine dependence, unspecified, uncomplicated: Secondary | ICD-10-CM

## 2017-06-27 DIAGNOSIS — M542 Cervicalgia: Secondary | ICD-10-CM | POA: Insufficient documentation

## 2017-06-27 DIAGNOSIS — M25512 Pain in left shoulder: Secondary | ICD-10-CM | POA: Diagnosis not present

## 2017-06-27 DIAGNOSIS — M4802 Spinal stenosis, cervical region: Secondary | ICD-10-CM | POA: Diagnosis not present

## 2017-06-27 MED ORDER — METHYLPREDNISOLONE 4 MG PO TBPK: ORAL_TABLET | ORAL | 0 refills | Status: DC

## 2017-06-27 MED ORDER — CELECOXIB 100 MG PO CAPS: 100.0000 mg | ORAL_CAPSULE | Freq: Two times a day (BID) | ORAL | 2 refills | Status: DC | PRN

## 2017-06-27 NOTE — Assessment & Plan Note (Signed)
No significant effusion today. Systemic treatment today

## 2017-06-27 NOTE — Progress Notes (Signed)
OFFICE VISIT NOTE Jason [name]. Jason [name] Sports Medicine [facility] at [facility] [phone]  Jason [name] - 51 y.o. male MRN [id number]  Date of birth: March 05, 1966  Visit Date: 06/27/2017  PCP: Jason Covey, MD   Referred by: Jason Covey, MD  Jason [name], LAT, ATC acting as scribe for Jason [name].  SUBJECTIVE:   Chief Complaint  Patient presents with  . Follow-up    B knee pain   HPI: As below and per problem based documentation when appropriate.  Jason [name] is an established [name] presenting today for f/u of his B knee pain.  He was last seen on 05/02/17 and was prescribed Duexis and given a Body Helix knee sleeve.  [name] states that he didn't like the Duexis and is wondering if he can get a different medication.  He states that he wore the knee sleeves intermittently but didn't think that they helped.  [name] states that his biggest issue is his neck pain and L arm pain that has been bothering him for approximately 6 months.  He notes that this pain has dramatically increased over the past few weeks - one month to the point that he almost had an accident at work due to sharp pain in his L upper arm.  He states that his L arm pain is a 10/10 at it's worst and rates it as a 2/10 at rest.  He states that the L neck and arm pain are very aggravating and annoying in nature.    ROS  Otherwise per HPI.  HISTORY & PERTINENT PRIOR DATA:  No specialty comments available. He reports that he has been smoking Cigarettes.  He has a 50.00 pack-year smoking history. He has never used smokeless tobacco.   Recent Labs  08/30/16 1721 12/09/16 1541 05/02/17 1556  HGBA1C 6.5 6.0  --   LABURIC  --   --  4.7   Allergies reviewed per EMR Prior to Admission medications   Medication Sig Start Date End Date Taking? Authorizing Provider  atorvastatin (LIPITOR) 20 MG tablet Take 20 mg by mouth daily. 09/19/16   [provider]  celecoxib (CELEBREX) 100 MG  capsule Take 1 capsule (100 mg total) by mouth 2 (two) times daily as needed. 06/27/17   Jason Mews, DO  Cyanocobalamin (B-12) 1000 MCG CAPS Take by mouth. Takes one tablet daily.    [provider]  doxycycline (VIBRAMYCIN) 100 MG capsule Take 1 capsule (100 mg total) by mouth 2 (two) times daily. 04/24/17   Burchette, Elberta Fortis, MD  gabapentin (NEURONTIN) 300 MG capsule Take 3 po in AM and 3 po QHS. 06/16/17   Jason Mews, DO  methylPREDNISolone (MEDROL DOSEPAK) 4 MG TBPK tablet Take by mouth as directed. Take 6 tablets on the first day prescribed then as directed. 06/27/17   Jason Mews, DO  Multiple Vitamins-Minerals (MULTIVITAMIN ADULT PO) Take by mouth.    [provider]  mupirocin ointment (BACTROBAN) 2 % Place 1 application into the nose 2 (two) times daily. 04/24/17   Burchette, Elberta Fortis, MD  naltrexone (DEPADE) 50 MG tablet TAKE 1 TABLET BY MOUTH EVERY DAY 11/01/16   Burchette, Elberta Fortis, MD   Patient Active Problem List   Diagnosis Date Noted  . Polyarthralgia 06/27/2017  . Neck pain 06/27/2017  . Primary osteoarthritis of both knees 05/02/2017  . Sensory neuronopathy 01/14/2017  . Transaminasemia 10/10/2016  . Vitamin B 12 deficiency 09/19/2016  .  Hyperglycemia 09/19/2016  . Chest pressure 07/04/2012  . Cough 07/04/2012  . Hyperlipidemia 10/12/2010  . ANXIETY 10/12/2010  . History of alcohol abuse 10/12/2010  . TOBACCO USE 10/12/2010  . DEPRESSION 10/12/2010  . Essential hypertension, benign 10/12/2010  . GERD 10/12/2010  . Osteoarthritis 10/12/2010  . Sleep apnea 10/12/2010   Past Medical History:  Diagnosis Date  . Anxiety   . Depression   . GERD (gastroesophageal reflux disease)   . Headache(784.0)   . Hyperlipidemia   . Hypertension   . Osteoarthritis    Family History  Problem Relation Age of Onset  . Heart attack Father 6048  . Hypertension Father   . Arthritis Mother   . Asthma Maternal Grandfather   . Stroke Paternal  Grandfather   . Heart disease Other   . Mental illness Other   . Cancer Neg Hx   . Diabetes Neg Hx   . Drug abuse Neg Hx   . Early death Neg Hx   . Hearing loss Neg Hx   . Hyperlipidemia Neg Hx    Past Surgical History:  Procedure Laterality Date  . ROTATOR CUFF REPAIR     Social History   Occupational History  . Not on file.   Social History Main Topics  . Smoking status: Current Every Day Smoker    Packs/day: 2.00    Years: 25.00    Types: Cigarettes  . Smokeless tobacco: Never Used  . Alcohol use 12.0 oz/week    20 Glasses of wine per week  . Drug use: No  . Sexual activity: Not Currently    OBJECTIVE:  VS:  HT:5\' 10"  (177.8 cm)   WT:214 lb 12.8 oz (97.4 kg)  BMI:30.82    BP:140/74  HR:73bpm  TEMP: ( )  RESP:96 % EXAM: Findings:  Bilateral upper extremities overall well aligned.  Grip strength is intact.  Upper extremity myotomes are normal.  Upper extremity sensation is intact.  He has pain with arm squeeze test as well as brachial plexus squeeze on the left normal on the right.  Limited range of motion side bending to the left.  Pain with Spurling's.  Bilateral knees overall well aligned.  Small effusion today on the right but this is minimal.  No overlying skin changes.     ASSESSMENT & PLAN:     ICD-10-CM   1. Polyarthralgia M25.50 DG Cervical Spine 2 or 3 views    DG Shoulder Left    Sedimentation rate    C-reactive protein    Rheumatoid factor    Cyclic citrul peptide antibody, IgG    ANA  2. TOBACCO USE F17.200   3. Primary osteoarthritis of both knees M17.0   4. Neck pain M54.2    ================================================================= Primary osteoarthritis of both knees No significant effusion today. Systemic treatment today  Polyarthralgia Check labs today given polyarthalgic complaints and + family hx of Rheumatoid arthritis per [name] report today  Neck pain With left radic - no weakness. Prior referal to NeuroSurg but never  followed through.  Low threshold for further imaging / EMG/NCS   ================================================================= No future appointments.  Follow-up: Return in about 4 weeks (around 07/25/2017).   CMA/ATC served as Neurosurgeonscribe during this visit. History, Physical, and Plan performed by medical provider. Documentation and orders reviewed and attested to.      Gaspar BiddingMichael Rigby, DO    [facility]

## 2017-06-27 NOTE — Assessment & Plan Note (Addendum)
With left radic - no weakness. Prior referal to NeuroSurg but never followed through.  Low threshold for further imaging / EMG/NCS

## 2017-06-27 NOTE — Assessment & Plan Note (Signed)
Check labs today given polyarthalgic complaints and + family hx of Rheumatoid arthritis per pt report today

## 2017-06-28 LAB — CYCLIC CITRUL PEPTIDE ANTIBODY, IGG: Cyclic Citrullin Peptide Ab: <16 UNITS

## 2017-06-28 LAB — C-REACTIVE PROTEIN: CRP: 0.8 mg/dL (ref 0.5–20.0)

## 2017-06-28 LAB — RHEUMATOID FACTOR

## 2017-06-28 LAB — ANA: Anti Nuclear Antibody(ANA): NEGATIVE

## 2017-06-28 LAB — SEDIMENTATION RATE: Sed Rate: 8 mm/hr (ref 0–20)

## 2017-07-07 ENCOUNTER — Telehealth: Payer: Self-pay | Admitting: Sports Medicine

## 2017-07-07 NOTE — Telephone Encounter (Signed)
Patient's wife called in reference to patient being "excessivley thirsty". Patient was concerned if this was caused by celecoxib (CELEBREX) 100 MG capsule. Please advise.

## 2017-07-07 NOTE — Telephone Encounter (Signed)
Brandy and I discussed follow up with PCP for symptoms given not common SE of celebrex and potential for DM

## 2017-07-07 NOTE — Telephone Encounter (Signed)
I am not aware of excessive thirst being a side effect of Celebrex. Pt had A1C done 09/25/16 which was 6.2 and last BG was done 05/02/17, non-fasting, and was 201. Pt notes excessive thirst, dry mouth, and increased urination (possibly d/t increased water intake). Pt denies blurred vision, HA, and fruity breath. Will forward to Dr. Durene CalHunter to advise as Dr. Berline Choughigby is out of the office.

## 2017-07-07 NOTE — Telephone Encounter (Signed)
Spoke with patient and advised that excessive thirst is not a common side effect of Celebrex. Pt has stopped the medication and will follow-up with PCP if sx persist.

## 2017-07-11 ENCOUNTER — Ambulatory Visit: Payer: BLUE CROSS/BLUE SHIELD | Admitting: Sports Medicine

## 2017-07-11 ENCOUNTER — Encounter: Payer: Self-pay | Admitting: Sports Medicine

## 2017-07-11 VITALS — BP 146/68 | HR 74 | Ht 70.0 in | Wt 208.2 lb

## 2017-07-11 DIAGNOSIS — M542 Cervicalgia: Secondary | ICD-10-CM | POA: Diagnosis not present

## 2017-07-11 DIAGNOSIS — E1165 Type 2 diabetes mellitus with hyperglycemia: Secondary | ICD-10-CM

## 2017-07-11 DIAGNOSIS — E119 Type 2 diabetes mellitus without complications: Secondary | ICD-10-CM | POA: Diagnosis not present

## 2017-07-11 DIAGNOSIS — E1159 Type 2 diabetes mellitus with other circulatory complications: Secondary | ICD-10-CM | POA: Insufficient documentation

## 2017-07-11 DIAGNOSIS — M1990 Unspecified osteoarthritis, unspecified site: Secondary | ICD-10-CM

## 2017-07-11 DIAGNOSIS — F17200 Nicotine dependence, unspecified, uncomplicated: Secondary | ICD-10-CM | POA: Diagnosis not present

## 2017-07-11 DIAGNOSIS — Z87898 Personal history of other specified conditions: Secondary | ICD-10-CM

## 2017-07-11 DIAGNOSIS — F1011 Alcohol abuse, in remission: Secondary | ICD-10-CM

## 2017-07-11 LAB — POCT URINALYSIS DIPSTICK
BILIRUBIN UA: NEGATIVE
Blood, UA: NEGATIVE
Glucose, UA: 1000
KETONES UA: NEGATIVE
LEUKOCYTES UA: NEGATIVE
Nitrite, UA: NEGATIVE
Protein, UA: NEGATIVE
SPEC GRAV UA: 1.015 (ref 1.010–1.025)
Urobilinogen, UA: 0.2 E.U./dL
pH, UA: 6 (ref 5.0–8.0)

## 2017-07-11 LAB — POCT GLYCOSYLATED HEMOGLOBIN (HGB A1C): Hemoglobin A1C: 11.6

## 2017-07-11 NOTE — Patient Instructions (Signed)

## 2017-07-11 NOTE — Assessment & Plan Note (Signed)
Has fairly significant osteoarthritis of the cervical spine with overall  well-maintained range of motion.  He is describing some neuropathic type pain in his upper extremities as well as in his bilateral feet.  Continue with gabapentin. Cervical spine MRI indicated given the duration of symptoms and lack of improvement.

## 2017-07-11 NOTE — Assessment & Plan Note (Signed)
Patient has polyarthralgia complaints.  Recently transitioned to Celebrex to be taken as needed.  I would like for him to try to wean off of this especially in the setting of his recent diagnosis of diabetes but am okay with intermittent, pulsed dosing.

## 2017-07-11 NOTE — Progress Notes (Signed)
OFFICE VISIT NOTE Jason FellsMichael D. Delorise Shinerigby, DO  Jason [name] [facility] [phone]  Jason [name] - 51 y.o. male MRN [id number]  Date of birth: [birth year]  Visit Date: 07/11/2017  PCP: Kristian CoveyBurchette, Bruce W, MD   Referred by: Kristian CoveyBurchette, Bruce W, MD  Jason PierceMolly A [name] PT, LAT, ATC acting as scribe for Dr. Berline Choughigby.  SUBJECTIVE:   Chief Complaint  Patient presents with  . Follow-up    neck pain and L UE radiculopathy   HPI: As below and per problem based documentation when appropriate.  Jason [name] is an established pt presenting for f/u of his neck pain w/ radicular L UE pain.  He was last seen by Dr. Berline Choughigby on 06/27/17.  Pt notes that he feels like his neck might be a little better but notes that his L arm is about the same.  He notes that he is taking 2 Celebrex/day and he feels like that is helping how his joints feel but he also states that he has been much more thirsty since starting the Celebrex.  He also reports a recent increase in calf cramping over the past 2 months.    Review of Systems  Musculoskeletal: Positive for joint pain.  Neurological: Positive for tingling.    Otherwise per HPI.  HISTORY & PERTINENT PRIOR DATA:  No specialty comments available. He reports that he has been smoking cigarettes.  He has a 50.00 pack-year smoking history. he has never used smokeless tobacco.  Recent Labs    08/30/16 1721 12/09/16 1541 05/02/17 1556 07/11/17 1546  HGBA1C 6.5 6.0  --  11.6  LABURIC  --   --  4.7  --    Allergies reviewed per EMR Prior to Admission medications   Medication Sig Start Date End Date Taking? Authorizing Provider  celecoxib (CELEBREX) 100 MG capsule Take 1 capsule (100 mg total) by mouth 2 (two) times daily as needed. 06/27/17  Yes Andrena Mewsigby, Jhonny Calixto D, DO  gabapentin (NEURONTIN) 300 MG capsule Take 3 po in AM and 3 po QHS. 06/16/17  Yes Andrena Mewsigby, Davy Westmoreland D, DO   Patient Active Problem List   Diagnosis Date Noted  . Diabetes  mellitus without complication (HCC) 07/11/2017  . Polyarthralgia 06/27/2017  . Neck pain 06/27/2017  . Primary osteoarthritis of both knees 05/02/2017  . Sensory neuronopathy 01/14/2017  . Transaminasemia 10/10/2016  . Vitamin B 12 deficiency 09/19/2016  . Hyperglycemia 09/19/2016  . Chest pressure 07/04/2012  . Cough 07/04/2012  . Hyperlipidemia 10/12/2010  . ANXIETY 10/12/2010  . History of alcohol abuse 10/12/2010  . TOBACCO USE 10/12/2010  . DEPRESSION 10/12/2010  . Essential hypertension, benign 10/12/2010  . GERD 10/12/2010  . Osteoarthritis 10/12/2010  . Sleep apnea 10/12/2010   Past Medical History:  Diagnosis Date  . Anxiety   . Depression   . GERD (gastroesophageal reflux disease)   . Headache(784.0)   . Hyperlipidemia   . Hypertension   . Osteoarthritis    Family History  Problem Relation Age of Onset  . Heart attack Father [id number]  . Hypertension Father   . Arthritis Mother   . Asthma Maternal Grandfather   . Stroke Paternal Grandfather   . Heart disease Other   . Mental illness Other   . Cancer Neg Hx   . Diabetes Neg Hx   . Drug abuse Neg Hx   . Early death Neg Hx   . Hearing loss Neg Hx   . Hyperlipidemia Neg Hx  Past Surgical History:  Procedure Laterality Date  . ROTATOR CUFF REPAIR     Social History   Occupational History  . Not on file  Tobacco Use  . Smoking status: Current Every Day Smoker    Packs/day: 2.00    Years: 25.00    Pack years: 50.00    Types: Cigarettes  . Smokeless tobacco: Never Used  Substance and Sexual Activity  . Alcohol use: Yes    Alcohol/week: 12.0 oz    Types: 20 Glasses of wine per week  . Drug use: No  . Sexual activity: Not Currently    OBJECTIVE:  VS:  HT:5\' 10"  (177.8 cm)   WT:208 lb 3.2 oz (94.4 kg)  BMI:29.87    BP:(!) 146/68  HR:74bpm  TEMP: ( )  RESP:96 % EXAM: Findings:  Adult male in no acute distress.  Alert and appropriate.  Normal appearing mucous membranes.  Left arm is overall well  aligned.  He has full overhead range of motion.  His internal rotation and external rotation strength is 5+/5.  No significant weakness with empty can testing or speeds testing.  Mild pain with O'Brien's testing.  No pain with Yergason's testing.  No reproducible pain with Hawkins.  No pain with axial load and circumduction.  He does have some discomfort with arm squeeze test and brachial plexus squeeze as well as a slight dysesthesia over the C5 dermatome on the left.  Mild pain with contralateral Spurling's compression test.  Reflexes are slightly diminished in the brachioradialis and triceps on the left.    RADIOLOGY: DG Cervical Spine 2 or 3 views CLINICAL DATA:  Neck pain for 6 months.  No known injury.  EXAM: CERVICAL SPINE - 2-3 VIEW  COMPARISON:  None.  FINDINGS: There is no evidence of cervical spine fracture or prevertebral soft tissue swelling. There is straightening of the normal cervical lordotic curve, with 2 mm anterolisthesis C3-4 and 1 mm anterolisthesis C4-5, both facet mediated. No other significant bone abnormalities are identified. Nuchal ligament calcification. BILATERAL carotid calcification. Lung apices clear. Advanced disc space narrowing C6-C7.  IMPRESSION: Spondylosis.  No acute findings.  Electronically Signed   By: Elsie StainJohn T Curnes M.D.   On: 06/28/2017 08:12 DG Shoulder Left CLINICAL DATA:  51 year old male with left neck and shoulder pain for 6 months with no known injury.  EXAM: LEFT SHOULDER - 2+ VIEW  COMPARISON:  Chest radiographs 10/12/2010.  FINDINGS: No glenohumeral joint dislocation. Proximal left humerus intact. Mild glenoid degenerative spurring. Mild superior subluxation of the humeral head relative to the glenoid. The left clavicle appears stable and intact. Left scapula appears intact. Negative visible left ribs and lung parenchyma.  IMPRESSION: 1.  No acute osseous abnormality identified. 2. Mild superior subluxation of the left  humeral head might indicate rotator cuff pathology.  Electronically Signed   By: Odessa FlemingH  Hall M.D.   On: 06/28/2017 08:11  ASSESSMENT & PLAN:     ICD-10-CM   1. Neck pain M54.2 POCT Urinalysis Dipstick    POCT HgB A1C    MR Cervical Spine Wo Contrast  2. Diabetes mellitus without complication (HCC) E11.9   3. Osteoarthritis, unspecified osteoarthritis type, unspecified site M19.90   4. TOBACCO USE F17.200   5. History of alcohol abuse Z87.898    ================================================================= Diabetes mellitus without complication (HCC) New diagnosis today.  A1c has been elevated so suspect this is more of a chronic issue but was on a Medrol Dosepak 2 weeks ago which likely exacerbated the polyuria  and polydipsia that he reports having over the past 2 months.  Will need to avoid steroids obviously going forward but will have him follow-up with his PCP tomorrow and he is already been scheduled.   Continue hydration with water and avoidance of sweet beverages.  Limit carbohydrate intake.  Will need pharmacotherapy but will discuss this with his PCP at the visit tomorrow.  Neck pain Has fairly significant osteoarthritis of the cervical spine with overall  well-maintained range of motion.  He is describing some neuropathic type pain in his upper extremities as well as in his bilateral feet.  Continue with gabapentin. Cervical spine MRI indicated given the duration of symptoms and lack of improvement.  Osteoarthritis Patient has polyarthralgia complaints.  Recently transitioned to Celebrex to be taken as needed.  I would like for him to try to wean off of this especially in the setting of his recent diagnosis of diabetes but am okay with intermittent, pulsed dosing.  TOBACCO USE Significantly high cardiovascular risk.  Continue smoking cessation  History of alcohol abuse Has quit drinking and relates a lot of his current pain and health problems to his sobriety.  Encouraged  him to continue being sober and discussed that sobriety is absolutely beneficial to his overall health.  Also, discussed that quitting has undoubtedly changed his metabolism and that the issues that are presenting have likely been "covered up" by his past alcohol use.    No notes on file ================================================================= Patient Instructions  We are ordering an MRI for you today.  The imaging office will be calling you to schedule your appointment after we obtain authorization from your insurance company.   Please be sure you have signed up for MyChart so that we can get your results to you.  We will be in touch with you as soon as we can.  Please know, it can take up to 3-4 business days for the radiologist and Dr. Berline Chough to have time to review the results and determine the best appropriate action.  If there is something that appears to be surgical or needs a referral to other specialists we will let you know through MyChart or telephone.  Otherwise we will plan to schedule a follow up appointment with Dr. Berline Chough once we have the results.   ================================================================= Future Appointments  Date Time Provider Department Center  07/12/2017 11:30 AM Burchette, Elberta Fortis, MD [facility]    Follow-up: Return for MRI review in person.   CMA/ATC served as Neurosurgeon during this visit. History, Physical, and Plan performed by medical provider. Documentation and orders reviewed and attested to.      Gaspar Bidding, DO    [name] Phys[facility]

## 2017-07-11 NOTE — Assessment & Plan Note (Signed)
Has quit drinking and relates a lot of his current pain and health problems to his sobriety.  Encouraged him to continue being sober and discussed that sobriety is absolutely beneficial to his overall health.  Also, discussed that quitting has undoubtedly changed his metabolism and that the issues that are presenting have likely been "covered up" by his past alcohol use.

## 2017-07-11 NOTE — Assessment & Plan Note (Signed)
New diagnosis today.  A1c has been elevated so suspect this is more of a chronic issue but was on a Medrol Dosepak 2 weeks ago which likely exacerbated the polyuria and polydipsia that he reports having over the past 2 months.  Will need to avoid steroids obviously going forward but will have him follow-up with his PCP tomorrow and he is already been scheduled.   Continue hydration with water and avoidance of sweet beverages.  Limit carbohydrate intake.  Will need pharmacotherapy but will discuss this with his PCP at the visit tomorrow.

## 2017-07-11 NOTE — Assessment & Plan Note (Signed)
Significantly high cardiovascular risk.  Continue smoking cessation

## 2017-07-12 ENCOUNTER — Encounter: Payer: Self-pay | Admitting: Family Medicine

## 2017-07-12 ENCOUNTER — Ambulatory Visit: Payer: BLUE CROSS/BLUE SHIELD | Admitting: Family Medicine

## 2017-07-12 VITALS — BP 140/80 | HR 76 | Temp 98.6°F | Wt 209.9 lb

## 2017-07-12 DIAGNOSIS — E119 Type 2 diabetes mellitus without complications: Secondary | ICD-10-CM | POA: Diagnosis not present

## 2017-07-12 DIAGNOSIS — Z9189 Other specified personal risk factors, not elsewhere classified: Secondary | ICD-10-CM

## 2017-07-12 LAB — POCT GLUCOSE (DEVICE FOR HOME USE): POC GLUCOSE: 400 mg/dL — AB (ref 70–99)

## 2017-07-12 MED ORDER — LORAZEPAM 1 MG PO TABS: ORAL_TABLET | ORAL | 0 refills | Status: DC

## 2017-07-12 MED ORDER — METFORMIN HCL 500 MG PO TABS: 500.0000 mg | ORAL_TABLET | Freq: Two times a day (BID) | ORAL | 6 refills | Status: DC

## 2017-07-12 NOTE — Patient Instructions (Signed)
Diabetes Mellitus and Food It is important for you to manage your blood sugar (glucose) level. Your blood glucose level can be greatly affected by what you eat. Eating healthier foods in the appropriate amounts throughout the day at about the same time each day will help you control your blood glucose level. It can also help slow or prevent worsening of your diabetes mellitus. Healthy eating may even help you improve the level of your blood pressure and reach or maintain a healthy weight. General recommendations for healthful eating and cooking habits include:  Eating meals and snacks regularly. Avoid going long periods of time without eating to lose weight.  Eating a diet that consists mainly of plant-based foods, such as fruits, vegetables, nuts, legumes, and whole grains.  Using low-heat cooking methods, such as baking, instead of high-heat cooking methods, such as deep frying.  Work with your dietitian to make sure you understand how to use the Nutrition Facts information on food labels. How can food affect me? Carbohydrates Carbohydrates affect your blood glucose level more than any other type of food. Your dietitian will help you determine how many carbohydrates to eat at each meal and teach you how to count carbohydrates. Counting carbohydrates is important to keep your blood glucose at a healthy level, especially if you are using insulin or taking certain medicines for diabetes mellitus. Alcohol Alcohol can cause sudden decreases in blood glucose (hypoglycemia), especially if you use insulin or take certain medicines for diabetes mellitus. Hypoglycemia can be a life-threatening condition. Symptoms of hypoglycemia (sleepiness, dizziness, and disorientation) are similar to symptoms of having too much alcohol. If your health care provider has given you approval to drink alcohol, do so in moderation and use the following guidelines:  Women should not have more than one drink per day, and men  should not have more than two drinks per day. One drink is equal to: ? 12 oz of beer. ? 5 oz of wine. ? 1 oz of hard liquor.  Do not drink on an empty stomach.  Keep yourself hydrated. Have water, diet soda, or unsweetened iced tea.  Regular soda, juice, and other mixers might contain a lot of carbohydrates and should be counted.  What foods are not recommended? As you make food choices, it is important to remember that all foods are not the same. Some foods have fewer nutrients per serving than other foods, even though they might have the same number of calories or carbohydrates. It is difficult to get your body what it needs when you eat foods with fewer nutrients. Examples of foods that you should avoid that are high in calories and carbohydrates but low in nutrients include:  Trans fats (most processed foods list trans fats on the Nutrition Facts label).  Regular soda.  Juice.  Candy.  Sweets, such as cake, pie, doughnuts, and cookies.  Fried foods.  What foods can I eat? Eat nutrient-rich foods, which will nourish your body and keep you healthy. The food you should eat also will depend on several factors, including:  The calories you need.  The medicines you take.  Your weight.  Your blood glucose level.  Your blood pressure level.  Your cholesterol level.  You should eat a variety of foods, including:  Protein. ? Lean cuts of meat. ? Proteins low in saturated fats, such as fish, egg whites, and beans. Avoid processed meats.  Fruits and vegetables. ? Fruits and vegetables that may help control blood glucose levels, such as apples,   mangoes, and yams.  Dairy products. ? Choose fat-free or low-fat dairy products, such as milk, yogurt, and cheese.  Grains, bread, pasta, and rice. ? Choose whole grain products, such as multigrain bread, whole oats, and brown rice. These foods may help control blood pressure.  Fats. ? Foods containing healthful fats, such as  nuts, avocado, olive oil, canola oil, and fish.  Does everyone with diabetes mellitus have the same meal plan? Because every person with diabetes mellitus is different, there is not one meal plan that works for everyone. It is very important that you meet with a dietitian who will help you create a meal plan that is just right for you. This information is not intended to replace advice given to you by your health care provider. Make sure you discuss any questions you have with your health care provider. Document Released: 05/12/2005 Document Revised: 01/21/2016 Document Reviewed: 07/12/2013 Elsevier Interactive Patient Education  2017 Elsevier Inc.  

## 2017-07-12 NOTE — Progress Notes (Signed)
Subjective:     Patient ID: Jason [name], male   DOB: 07/11/1966, 51 y.o.   MRN: [id number]  HPI Patient seen for evaluation of elevated blood sugars. He had some elevated blood sugars back in the past when he was abusing alcohol and was in to see sports medicine yesterday for some cervical neck pain and complained of some increased thirst. He had A1c of 11.6. Does not monitor home blood sugars. Has gained some weight since he quit drinking. Has not been treated for diabetes with medication previously.  Poor compliance with diet. Eats a lot of bread and high carb foods such as grapes.  Apparently, also drinking lots of ginger ale. He's had some urine frequency and increased thirst for several weeks. Has been on recent oral prednisone but has probably had elevations much longer because of A1c 11.6.  Has been totally abstinent from alcohol for several months after going through brief inpatient stay.  Past Medical History:  Diagnosis Date  . Anxiety   . Depression   . GERD (gastroesophageal reflux disease)   . Headache(784.0)   . Hyperlipidemia   . Hypertension   . Osteoarthritis    Past Surgical History:  Procedure Laterality Date  . ROTATOR CUFF REPAIR      reports that he has been smoking cigarettes.  He has a 50.00 pack-year smoking history. he has never used smokeless tobacco. He reports that he drinks about 12.0 oz of alcohol per week. He reports that he does not use drugs. family history includes Arthritis in his mother; Asthma in his maternal grandfather; Heart attack (age of onset: 4848) in his father; Heart disease in his other; Hypertension in his father; Mental illness in his other; Stroke in his paternal grandfather. Allergies  Allergen Reactions  . Olmesartan Medoxomil     REACTION: dizziness     Review of Systems  Constitutional: Negative for appetite change, fever and unexpected weight change.  Respiratory: Negative for shortness of breath.   Cardiovascular:  Negative for chest pain.  Gastrointestinal: Negative for abdominal pain, nausea and vomiting.  Endocrine: Positive for polydipsia and polyuria.  Genitourinary: Negative for dysuria.  Musculoskeletal: Positive for neck pain.       Objective:   Physical Exam  Constitutional: He is oriented to person, place, and time. He appears well-developed and well-nourished.  Cardiovascular: Normal rate and regular rhythm.  Pulmonary/Chest: Effort normal and breath sounds normal. No respiratory distress. He has no wheezes. He has no rales.  Musculoskeletal: He exhibits no edema.  Neurological: He is alert and oriented to person, place, and time.       Assessment:     #1 type 2 diabetes. 2 hour postprandial blood sugar today 400 and A1c is 11.6.  Some exacerbation no doubt from recent steroids  #2 history of alcohol abuse    Plan:     -Stressed importance of continued total abstinence from alcohol -Set up to see certified diabetes educator -Start metformin 500 mg twice a day-recent hepatic function and renal function stable -Avoid further prednisone if possible -Discussed dietary modification. Get rid of colas and avoid grapes another high glycemic foods. -May need to add some insulin short-term bone reassess in a couple weeks to see how is responding -Home glucose monitor given. Be in touch if he is having consistent readings over 300 a few days after starting metformin -Home glucose monitor given and nurse instructed patient on use -Patient requesting premedication for upcoming MRI of neck with history of reported "  claustrophobia". We prescribed lorazepam 1 mg take 1 hour prior to procedure.  Kristian CoveyBruce W Tawn Fitzner MD [facility]

## 2017-07-13 ENCOUNTER — Telehealth: Payer: Self-pay | Admitting: Family Medicine

## 2017-07-13 NOTE — Telephone Encounter (Signed)
Copied from CRM [phone]. Topic: General - Other >> Jul 13, 2017 12:00 PM Crist InfanteHarrald, Kathy J wrote: Pt insurance covers Contour meter and strips. Pt needs a rx for meter, strips and lancets.  [id number] [place] , [place] - [address] [phone] (Phone) [phone] (Fax)  Pt is to test 1 x day  (did not cover the accu check pt was given in the office)

## 2017-07-14 MED ORDER — BAYER CONTOUR MONITOR DEVI: 0 refills | Status: DC

## 2017-07-14 MED ORDER — GLUCOSE BLOOD VI STRP
ORAL_STRIP | 3 refills | Status: DC
Start: 2017-07-14 — End: 2018-05-30

## 2017-07-14 MED ORDER — LANCETS MISC: 3 refills | Status: AC

## 2017-07-14 NOTE — Telephone Encounter (Signed)
Rx sent to pharmacy   

## 2017-07-14 NOTE — Addendum Note (Signed)
Addended by: Kern ReapVEREEN, Dian Laprade B on: 07/14/2017 10:18 AM   Modules accepted: Orders

## 2017-07-27 ENCOUNTER — Ambulatory Visit
Admission: RE | Admit: 2017-07-27 | Discharge: 2017-07-27 | Disposition: A | Discharge status: Home / Self Care | Payer: BLUE CROSS/BLUE SHIELD | Source: Ambulatory Visit | Attending: Sports Medicine | Admitting: Sports Medicine

## 2017-07-27 DIAGNOSIS — M47812 Spondylosis without myelopathy or radiculopathy, cervical region: Secondary | ICD-10-CM | POA: Diagnosis not present

## 2017-07-27 DIAGNOSIS — M542 Cervicalgia: Secondary | ICD-10-CM

## 2017-07-28 ENCOUNTER — Ambulatory Visit: Payer: BLUE CROSS/BLUE SHIELD | Admitting: Sports Medicine

## 2017-07-28 NOTE — Progress Notes (Signed)
Called pt and scheduled him for f/u w/ Dr. Berline Choughigby for Tuesday, Dec. 4th at 8:20 am. -MW

## 2017-07-28 NOTE — Progress Notes (Signed)
I would like to plan to see him back to check on him he is doing and reexamine him.  We need to discuss potential interventions but given the recent diagnosis of diabetes further corticosteroids are likely not appropriate.  Please call and schedule an appointment with him.  You have fairly significant changes in your neck they actually look worse than several years ago.    would like to have you come in and discuss this in person and check on how you are doing with her diabetes management.

## 2017-07-31 ENCOUNTER — Encounter: Payer: Self-pay | Admitting: Dietician

## 2017-07-31 ENCOUNTER — Encounter: Payer: BLUE CROSS/BLUE SHIELD | Attending: Family Medicine | Admitting: Dietician

## 2017-07-31 DIAGNOSIS — E119 Type 2 diabetes mellitus without complications: Secondary | ICD-10-CM | POA: Insufficient documentation

## 2017-07-31 DIAGNOSIS — Z713 Dietary counseling and surveillance: Secondary | ICD-10-CM | POA: Diagnosis not present

## 2017-07-31 NOTE — Progress Notes (Signed)
Diabetes Self-Management Education  Visit Type: First/Initial  Appt. Start Time: 1530 Appt. End Time: 1700  07/31/2017  Jason [name], identified by name and date of birth, is a 51 y.o. male with a diagnosis of Diabetes: Type 2. Other history includes history of ETOH abuse (stopped since January), depression, GERD, hyperlipidemia, HTN and spinal pain recently requiring steroids.  Medications include Metformin.  Patient lives with his wife, 74 yo son, and mother in [place].  His wife does most of the shopping and cooking.  She has recently been diagnosed with prediabetes.  She reports feeling better with changes to decrease sugar in the diet.  Patient works as an Personnel officer.  ASSESSMENT  Height 5\' 10"  (1.778 m), weight 214 lb (97.1 kg). Body mass index is 30.71 kg/m.  Diabetes Self-Management Education - 07/31/17 1545      Visit Information   Visit Type  First/Initial      Initial Visit   Diabetes Type  Type 2    Are you currently following a meal plan?  No    Are you taking your medications as prescribed?  Yes    Date Diagnosed  07/12/17      Health Coping   How would you rate your overall health?  Good      Psychosocial Assessment   Patient Belief/Attitude about Diabetes  Motivated to manage diabetes    Self-care barriers  None    Self-management support  Doctor's office;Family    Other persons present  Patient;Spouse/SO    Patient Concerns  Nutrition/Meal planning;Glycemic Control    Special Needs  None    Preferred Learning Style  No preference indicated    Learning Readiness  Not Ready    How often do you need to have someone help you when you read instructions, pamphlets, or other written materials from your doctor or pharmacy?  1 - Never    What is the last grade level you completed in school?  2 years college      Pre-Education Assessment   Patient understands the diabetes disease and treatment process.  Needs Instruction    Patient understands incorporating  nutritional management into lifestyle.  Needs Instruction    Patient undertands incorporating physical activity into lifestyle.  Needs Instruction    Patient understands using medications safely.  Needs Instruction    Patient understands monitoring blood glucose, interpreting and using results  Needs Instruction    Patient understands prevention, detection, and treatment of acute complications.  Needs Instruction    Patient understands prevention, detection, and treatment of chronic complications.  Needs Instruction    Patient understands how to develop strategies to address psychosocial issues.  Needs Instruction    Patient understands how to develop strategies to promote health/change behavior.  Needs Instruction      Complications   Last HgB A1C per patient/outside source  11.6 % 07/11/17    How often do you check your blood sugar?  1-2 times/day    Fasting Blood glucose range (mg/dL)  >161 096 this am    Have you had a dilated eye exam in the past 12 months?  No    Have you had a dental exam in the past 12 months?  No    Are you checking your feet?  No      Dietary Intake   Breakfast  none    Snack (morning)  none    Lunch  ham and cheese sandwich on [employer] bread 9am-12pm    Snack (afternoon)  clementine or protein bar    Dinner  chicken or fish or pork or steak or lamb (meat and 2 vegetables, rice or potatoes, salad, fruit) 8 pm    Snack (evening)  used to eat chocolate or cake and now sugar free chocolate    Beverage(s)  vitamin water, sugar free gingerale, sugar free coke, [employer] non alcoholic beer, water, whole milk, coffee with sugar free creamer and stevia      Exercise   Exercise Type  Light (walking / raking leaves) increased walking on the job, ladders      Patient Education   Previous Diabetes Education  No    Disease state   Definition of diabetes, type 1 and 2, and the diagnosis of diabetes    Nutrition management   Role of diet in the treatment of diabetes and the  relationship between the three main macronutrients and blood glucose level;Food label reading, portion sizes and measuring food.;Meal options for control of blood glucose level and chronic complications.;Meal timing in regards to the patients' current diabetes medication.    Physical activity and exercise   Role of exercise on diabetes management, blood pressure control and cardiac health.    Medications  Reviewed patients medication for diabetes, action, purpose, timing of dose and side effects.    Monitoring  Purpose and frequency of SMBG.;Identified appropriate SMBG and/or A1C goals.;Daily foot exams;Yearly dilated eye exam    Chronic complications  Relationship between chronic complications and blood glucose control;Assessed and discussed foot care and prevention of foot problems;Retinopathy and reason for yearly dilated eye exams;Dental care    Psychosocial adjustment  Worked with patient to identify barriers to care and solutions;Role of stress on diabetes    Personal strategies to promote health  Lifestyle issues that need to be addressed for better diabetes care      Individualized Goals (developed by patient)   Nutrition  General guidelines for healthy choices and portions discussed    Physical Activity  Exercise 5-7 days per week;30 minutes per day    Monitoring   test my blood glucose as discussed    Problem Solving  meal timing    Reducing Risk  examine blood glucose patterns;stop smoking;do foot checks daily;get labs drawn    Health Coping  discuss diabetes with (comment)      Post-Education Assessment   Patient understands the diabetes disease and treatment process.  Demonstrates understanding / competency    Patient understands incorporating nutritional management into lifestyle.  Demonstrates understanding / competency    Patient undertands incorporating physical activity into lifestyle.  Demonstrates understanding / competency    Patient understands using medications safely.   Demonstrates understanding / competency    Patient understands monitoring blood glucose, interpreting and using results  Demonstrates understanding / competency    Patient understands prevention, detection, and treatment of acute complications.  Demonstrates understanding / competency    Patient understands prevention, detection, and treatment of chronic complications.  Demonstrates understanding / competency    Patient understands how to develop strategies to address psychosocial issues.  Demonstrates understanding / competency    Patient understands how to develop strategies to promote health/change behavior.  Demonstrates understanding / competency      Outcomes   Expected Outcomes  Demonstrated interest in learning. Expect positive outcomes    Future DMSE  PRN    Program Status  Completed       Individualized Plan for Diabetes Self-Management Training:   Learning Objective:  Patient will have a  greater understanding of diabetes self-management. Patient education plan is to attend individual and/or group sessions per assessed needs and concerns.   Plan:   Patient Instructions  Resume Vitamin B-12 (sublingual or dissolvable)  Great job on the changes that you have made!  Drinking beverages without sugar  Avoiding sweets Continue to work on balancing carbohydrates throughout the day.  Avoid piling carbs with dinner and snack after. Recommend adding breakfast.  Aim for 3 Carb Choices per meal (1 grams) +/- 1 either way  Aim for 0-1 Carbs per snack if hungry  Include protein in moderation with your meals and snacks Consider reading food labels for Total Carbohydrate and Fat Grams of foods Consider  increasing your activity level by walking or other for 30 minutes daily as tolerated Continue checking BG at alternate times per day as directed by MD  Continue taking medication as directed by MD      Expected Outcomes:  Demonstrated interest in learning. Expect positive  outcomes  Education material provided: Living Well with Diabetes, Food label handouts, A1C conversion sheet, Meal plan card, My Plate and Snack sheet, low glycemic food list  If problems or questions, patient to contact team via:  Phone  Future DSME appointment: PRN

## 2017-07-31 NOTE — Patient Instructions (Addendum)
Resume Vitamin B-12 (sublingual or dissolvable)  Great job on the changes that you have made!  Drinking beverages without sugar  Avoiding sweets Continue to work on balancing carbohydrates throughout the day.  Avoid piling carbs with dinner and snack after. Recommend adding breakfast.  Aim for 3 Carb Choices per meal (1 grams) +/- 1 either way  Aim for 0-1 Carbs per snack if hungry  Include protein in moderation with your meals and snacks Consider reading food labels for Total Carbohydrate and Fat Grams of foods Consider  increasing your activity level by walking or other for 30 minutes daily as tolerated Continue checking BG at alternate times per day as directed by MD  Continue taking medication as directed by MD

## 2017-08-01 ENCOUNTER — Encounter: Payer: Self-pay | Admitting: Sports Medicine

## 2017-08-01 ENCOUNTER — Ambulatory Visit: Payer: BLUE CROSS/BLUE SHIELD | Admitting: Sports Medicine

## 2017-08-01 VITALS — BP 130/68 | HR 65 | Ht 70.0 in | Wt 215.6 lb

## 2017-08-01 DIAGNOSIS — M5020 Other cervical disc displacement, unspecified cervical region: Secondary | ICD-10-CM

## 2017-08-01 DIAGNOSIS — M542 Cervicalgia: Secondary | ICD-10-CM | POA: Diagnosis not present

## 2017-08-01 DIAGNOSIS — M2550 Pain in unspecified joint: Secondary | ICD-10-CM | POA: Diagnosis not present

## 2017-08-01 DIAGNOSIS — Z87898 Personal history of other specified conditions: Secondary | ICD-10-CM

## 2017-08-01 DIAGNOSIS — M4722 Other spondylosis with radiculopathy, cervical region: Secondary | ICD-10-CM

## 2017-08-01 DIAGNOSIS — E119 Type 2 diabetes mellitus without complications: Secondary | ICD-10-CM

## 2017-08-01 DIAGNOSIS — M479 Spondylosis, unspecified: Secondary | ICD-10-CM | POA: Insufficient documentation

## 2017-08-01 DIAGNOSIS — F1011 Alcohol abuse, in remission: Secondary | ICD-10-CM

## 2017-08-01 MED ORDER — DULOXETINE HCL 30 MG PO CPEP: 30.0000 mg | ORAL_CAPSULE | Freq: Every day | ORAL | 3 refills | Status: DC

## 2017-08-01 NOTE — Assessment & Plan Note (Signed)
Given the findings epidural steroid injection would likely give him significant benefit.  We discussed risks and benefits of this and I would like to consider having him do this but would like for him to discuss this further with Dr. Caryl NeverBurchette tomorrow to ensure that he is okay with this but given the fact that his blood sugars have come down nicely and were in the low 160s today fasted anticipate that he should be able to handle a low dose epidural without significant exacerbations.  Could consider increasing his metformin to thousand milligrams daily will need to be cautious with the addition of Cymbalta as well given the possibility of GI side effects.

## 2017-08-01 NOTE — Assessment & Plan Note (Signed)
Patient is also reporting some cramping sensations today in his bilateral calves that are relatively new.  Recommend topical Arnica and magnesium supplementation and if any persistent ongoing symptoms further diagnostic evaluation can be considered but this was mentioned at the end of the visit and I anticipate likely related to his diuresis from glycosuria

## 2017-08-01 NOTE — Progress Notes (Signed)
Veverly FellsMichael D. Delorise Shinerigby, DO  [facility] [phone]  Jason Chaddriaan J Imhoff - [age] y.o. male MRN [id number]  Date of birth: 05-Jul-1966   Scribe for today's visit: Stevenson ClinchBrandy Coleman, CMA    SUBJECTIVE:  Jason [name] is here for Follow-up (neck and LT shoulder pain)  Compared to the last office visit, his previously described symptoms show no change  Current symptoms are severe when he uses it "the wrong way" but mild when he doesn't use it "the wrong way". The pain isn't really in his neck, more so into the left arm.  He has been taking Celebrex and Gabapentin daily.    He has begun to notice a lot of cramping in both calves when walking to the point where his wife Pam noticed it caused his foot to "turn in".    ROS Reports night time disturbances. Denies fevers, chills, or night sweats. Denies unexplained weight loss. Denies personal history of cancer. Denies changes in bowel or bladder habits. Denies recent unreported falls. Denies new or worsening dyspnea or wheezing. Denies headaches or dizziness.  Denies numbness, tingling or weakness in the extremities.  Denies dizziness or presyncopal episodes Denies lower extremity edema    HISTORY & PERTINENT PRIOR DATA:  Prior History reviewed and updated per electronic medical record. Significant history, findings, studies and interim changes include: No additional findings.  reports that he has been smoking cigarettes.  He has a 50.00 pack-year smoking history. he has never used smokeless tobacco. Recent Labs    08/30/16 1721 12/09/16 1541 05/02/17 1556 07/11/17 1546  HGBA1C 6.5 6.0  --  11.6  LABURIC  --   --  4.7  --    Problem  Herniated Cervical Intervertebral Disc   MRI 07/27/2017: Multilevel degenerative changes most notably at C5-6 with endplate osteophytosis and protruding disc material at C5-6 on the right and C6-7 on the left correlating well with his symptoms.  No evidence  of cervical myelopathy.   Osteoarthritis of Spine  Polyarthralgia     OBJECTIVE:  VS:  HT:5\' 10"  (177.8 cm)   WT:215 lb 9.6 oz (97.8 kg)  BMI:30.94    BP:130/68  HR:65bpm  TEMP: ( )  RESP:94 %  PHYSICAL EXAM: Constitutional: WDWN, Non-toxic appearing. Psychiatric: Alert & appropriately interactive. Not depressed or anxious appearing. Respiratory: No increased work of breathing. Trachea Midline Eyes: Pupils are equal. EOM intact without nystagmus. No scleral icterus  Patient walks with normal gait. Overhead range of motion of his arms is normal.  Cervical range of motion is limited.   ASSESSMENT & PLAN:   1. History of alcohol abuse   2. Diabetes mellitus without complication (HCC)   3. Polyarthralgia   4. Neck pain   5. Herniated cervical intervertebral disc   6. Osteoarthritis of spine with radiculopathy, cervical region    Plan:     Herniated cervical intervertebral disc Given the findings epidural steroid injection would likely give him significant benefit.  We discussed risks and benefits of this and I would like to consider having him do this but would like for him to discuss this further with Dr. Caryl NeverBurchette tomorrow to ensure that he is okay with this but given the fact that his blood sugars have come down nicely and were in the low 160s today fasted anticipate that he should be able to handle a low dose epidural without significant exacerbations.  Could consider increasing his metformin to thousand milligrams daily will need to  be cautious with the addition of Cymbalta as well given the possibility of GI side effects.  Polyarthralgia Patient is also reporting some cramping sensations today in his bilateral calves that are relatively new.  Recommend topical Arnica and magnesium supplementation and if any persistent ongoing symptoms further diagnostic evaluation can be considered but this was mentioned at the end of the visit and I anticipate likely related to his  diuresis from glycosuria   ++++++++++++++++++++++++++++++++++++++++++++ Orders:  Orders Placed This Encounter  Procedures  . Ambulatory referral to Physical Medicine Rehab    Meds:  Meds ordered this encounter  Medications  . DULoxetine (CYMBALTA) 30 MG capsule    Sig: Take 1 capsule (30 mg total) by mouth daily.    Dispense:  30 capsule    Refill:  3    ++++++++++++++++++++++++++++++++++++++++++++ Follow-up: Return in about 8 weeks (around 09/26/2017).   Pertinent documentation may be included in additional procedure notes, imaging studies, problem based documentation and patient instructions. Please see these sections of the encounter for additional information regarding this visit. CMA/ATC served as Neurosurgeonscribe during this visit. History, Physical, and Plan performed by medical provider. Documentation and orders reviewed and attested to.      Andrena MewsMichael D Rigby, DO    [facility]

## 2017-08-01 NOTE — Patient Instructions (Signed)
Supplements you can try:  Tumeric - 500mg  by mouth twice daily Topical Arnica Gel: up to 4 times daily  [email or web address] -  [email or web address]://[email or web address]-page/clear-[email or web address]-oil  We are referring you to Dr. Alvester MorinNewton for an epidural steroid injection.  I would like for you to start on the medications as prescribed.  Follow-up with Dr. Caryl NeverBurchette tomorrow to discuss appropriateness of referral to get the epidural but given the fact her blood sugars have done as well as I have I think this will be appropriate.

## 2017-08-02 ENCOUNTER — Ambulatory Visit: Payer: BLUE CROSS/BLUE SHIELD | Admitting: Family Medicine

## 2017-08-02 ENCOUNTER — Encounter: Payer: Self-pay | Admitting: Family Medicine

## 2017-08-02 VITALS — BP 110/70 | HR 70 | Temp 97.9°F | Wt 214.3 lb

## 2017-08-02 DIAGNOSIS — E119 Type 2 diabetes mellitus without complications: Secondary | ICD-10-CM | POA: Diagnosis not present

## 2017-08-02 MED ORDER — METFORMIN HCL 500 MG PO TABS: ORAL_TABLET | ORAL | 3 refills | Status: DC

## 2017-08-02 NOTE — Progress Notes (Signed)
Subjective:     Patient ID: Jason [name], male   DOB: October 10, 1965, 51 y.o.   MRN: [id number]  HPI Patient seen for follow-up regarding diabetes. He had recent blood sugar of 400 2 hour prandial and A1c 11.6. He had symptoms of polyuria and polydipsia and some weight loss. We started metformin 500 mg twice daily. Tolerating without side effects. His weight is up 5 more pounds today. Feels better overall. Sugars recently fasting around 160. These were initially around 300 fasting.  He was referred to nutritionist and he felt this was helpful. He has been scaling back sugars and starches.  He continues to have problems with some left neck and upper extremity pain. Is looking at possible epidural injections.  Past Medical History:  Diagnosis Date  . Anxiety   . Depression   . GERD (gastroesophageal reflux disease)   . Headache(784.0)   . Hyperlipidemia   . Hypertension   . Osteoarthritis    Past Surgical History:  Procedure Laterality Date  . ROTATOR CUFF REPAIR      reports that he has been smoking cigarettes.  He has a 50.00 pack-year smoking history. he has never used smokeless tobacco. He reports that he drinks about 12.0 oz of alcohol per week. He reports that he does not use drugs. family history includes Arthritis in his mother; Asthma in his maternal grandfather; Heart attack (age of onset: 3548) in his father; Heart disease in his other; Hypertension in his father; Mental illness in his other; Stroke in his paternal grandfather. Allergies  Allergen Reactions  . Olmesartan Medoxomil     REACTION: dizziness     Review of Systems  Constitutional: Negative for appetite change and fever.  Respiratory: Negative for cough and shortness of breath.   Cardiovascular: Negative for chest pain.  Gastrointestinal: Negative for abdominal pain, diarrhea, nausea and vomiting.  Endocrine: Negative for polydipsia and polyuria.  Genitourinary: Negative for dysuria.       Objective:   Physical Exam  Constitutional: He appears well-developed and well-nourished. No distress.  Cardiovascular: Normal rate and regular rhythm.  Pulmonary/Chest: Effort normal and breath sounds normal. No respiratory distress. He has no wheezes. He has no rales.  Musculoskeletal: He exhibits no edema.       Assessment:     Type 2 diabetes improving with fasting blood sugars recently around 160 and symptoms improving    Plan:     -Titrate metformin up to 500 mg 2 tablets twice daily. He has follow-up in about 3 months and repeat A1c then -Continue to restrict high glycemic foods and white starches -I think his blood sugars are stable enough now that he could go ahead with epidural injections as indicated  Kristian CoveyBruce W Burchette MD [facility]

## 2017-08-02 NOTE — Patient Instructions (Signed)
Increase the Metformin to two tablets twice daily..   Let's plan to follow up in 3 months and will repeat A1C then. Consider OTC Melatonin for insomnia.

## 2017-08-14 ENCOUNTER — Encounter (INDEPENDENT_AMBULATORY_CARE_PROVIDER_SITE_OTHER): Payer: Self-pay | Admitting: Physical Medicine and Rehabilitation

## 2017-08-14 ENCOUNTER — Ambulatory Visit (INDEPENDENT_AMBULATORY_CARE_PROVIDER_SITE_OTHER): Payer: BLUE CROSS/BLUE SHIELD | Admitting: Physical Medicine and Rehabilitation

## 2017-08-14 ENCOUNTER — Ambulatory Visit (INDEPENDENT_AMBULATORY_CARE_PROVIDER_SITE_OTHER): Payer: BLUE CROSS/BLUE SHIELD

## 2017-08-14 VITALS — BP 128/78 | HR 70 | Temp 98.4°F

## 2017-08-14 DIAGNOSIS — M5412 Radiculopathy, cervical region: Secondary | ICD-10-CM

## 2017-08-14 DIAGNOSIS — M5010 Cervical disc disorder with radiculopathy, unspecified cervical region: Secondary | ICD-10-CM | POA: Diagnosis not present

## 2017-08-14 MED ORDER — LIDOCAINE HCL (PF) 1 % IJ SOLN
2.0000 mL | Freq: Once | INTRAMUSCULAR | Status: AC
  Administered 2017-08-14: 2 mL

## 2017-08-14 MED ORDER — BETAMETHASONE SOD PHOS & ACET 6 (3-3) MG/ML IJ SUSP
12.0000 mg | Freq: Once | INTRAMUSCULAR | Status: AC
  Administered 2017-08-14: 12 mg

## 2017-08-14 NOTE — Progress Notes (Deleted)
Pt states pain in left arm, +Driver, -Dye Allergy, -BT.

## 2017-08-14 NOTE — Patient Instructions (Signed)

## 2017-08-16 ENCOUNTER — Other Ambulatory Visit: Payer: Self-pay | Admitting: Family Medicine

## 2017-08-18 ENCOUNTER — Other Ambulatory Visit: Payer: Self-pay

## 2017-08-18 MED ORDER — CELECOXIB 100 MG PO CAPS: 100.0000 mg | ORAL_CAPSULE | Freq: Two times a day (BID) | ORAL | 0 refills | Status: DC | PRN

## 2017-08-30 NOTE — Procedures (Signed)
[name] is a 52 year old right-hand-dominant gentleman followed by Dr. Berline Choughigby.  He comes in today complaining of chronic worsening left arm pain which appears to be radicular.  It appears to be more of a C6 distribution.  He has no right-sided complaints.  He has had good conservative care with medication management as well as time therapy with Dr. Berline Choughigby.  MRI of the cervical spine was performed and this is reviewed below.  He has multilevel spondylosis left more than right with left foraminal stenosis at multiple levels more than right.  We are going to complete a diagnostic and hopefully therapeutic cervical epidural injection.  This would be a C7-T1 interlaminar epidural injection.  As of note, the MRI is dictated with the notion that the right sided symptoms are worse than the left and I believe this is erroneous.  Cervical Epidural Steroid Injection - Interlaminar Approach with Fluoroscopic Guidance  Patient: Jason [name]      Date of Birth: Feb 06, 1966 MRN: [id number] PCP: Kristian CoveyBurchette, Bruce W, MD      Visit Date: 08/14/2017   Universal Protocol:    Date/Time: 01/02/195:33 AM  Consent Given By: the patient  Position: PRONE  Additional Comments: Vital signs were monitored before and after the procedure. Patient was prepped and draped in the usual sterile fashion. The correct patient, procedure, and site was verified.   Injection Procedure Details:  Procedure Site One Meds Administered:  Meds ordered this encounter  Medications  . lidocaine (PF) (XYLOCAINE) 1 % injection 2 mL  . betamethasone acetate-betamethasone sodium phosphate (CELESTONE) injection 12 mg     Laterality: Left  Location/Site:  C7-T1  Needle size: 20 G  Needle type: Touhy  Needle Placement: Paramedian epidural space  Findings:  -Comments: Excellent flow of contrast into the epidural space.  Procedure Details: Using a paramedian approach from the side mentioned above, the region overlying the  inferior lamina was localized under fluoroscopic visualization and the soft tissues overlying this structure were infiltrated with 4 ml. of 1% Lidocaine without Epinephrine. A # 20 gauge, Tuohy needle was inserted into the epidural space using a paramedian approach.  The epidural space was localized using loss of resistance along with lateral and contralateral oblique bi-planar fluoroscopic views.  After negative aspirate for air, blood, and CSF, a 2 ml. volume of Isovue-250 was injected into the epidural space and the flow of contrast was observed. Radiographs were obtained for documentation purposes.   The injectate was administered into the level noted above.  Additional Comments:  The patient tolerated the procedure well No complications occurred Dressing: Band-Aid    Post-procedure details: Patient was observed during the procedure. Post-procedure instructions were reviewed.  Patient left the clinic in stable condition.

## 2017-09-05 ENCOUNTER — Encounter: Payer: Self-pay | Admitting: Sports Medicine

## 2017-09-05 ENCOUNTER — Ambulatory Visit: Payer: BLUE CROSS/BLUE SHIELD | Admitting: Sports Medicine

## 2017-09-05 VITALS — BP 132/76 | HR 74 | Ht 70.0 in | Wt 216.2 lb

## 2017-09-05 DIAGNOSIS — M4722 Other spondylosis with radiculopathy, cervical region: Secondary | ICD-10-CM

## 2017-09-05 DIAGNOSIS — M5020 Other cervical disc displacement, unspecified cervical region: Secondary | ICD-10-CM | POA: Diagnosis not present

## 2017-09-05 DIAGNOSIS — M542 Cervicalgia: Secondary | ICD-10-CM | POA: Diagnosis not present

## 2017-09-05 NOTE — Assessment & Plan Note (Signed)
Given the persistent symptoms surgical evaluation was offered but he would like to avoid this at all cost.  Alternative options include further evaluation with nerve conduction studies and EMGs to rule out severe radiculopathy.  As long as this is reassuring given the fact that he has really minimal weakness which is more likely related to disuse and truly neurogenic in nature, repeat epidural steroid injection can be considered and this is what he is requesting.  If there is evidence of severe radiculopathy surgical evaluation will be indicated.  We did discuss the possibility of chronic nerve damage with delayed decompression but overall given his current symptoms and physical exam findings that are overall reassuring continue conservative measures are appropriate.  Plan to refer back to Dr. Alvester MorinNewton for EMG/nerve conduction studies and will plan for repeat epidural as long as testing is reassuring.

## 2017-09-05 NOTE — Progress Notes (Signed)
[name]. [name] at [facility] [phone]  Jason [name] - 52 y.o. male MRN [id number]  Date of birth: [birth year]  Visit Date: 09/05/2017  PCP: [name], MD   Referred by: [name], MD   Scribe for today's visit: Christoper Fabian, LAT, ATC     SUBJECTIVE:  Jason [name] is here for Follow-up (Neck pain and radicular L arm pain) .   Compared to the last office visit on 08/01/17, his previously described neck and radiating L arm symptoms show only mild change since his epidural.  He is describing a feeling of "warmth" in his L UE from the shoulder to elbow. Current symptoms are moderate & are radiating to L UE, mainly to the elbow. He has been to see Dr. Alvester Morin on 08/14/17 and received a C7-T1 epidural injection.  He is taking Gabapentin, Celebrex and Cymbalta.    ROS Reports night time disturbances. Denies fevers, chills, or night sweats. Denies unexplained weight loss. Denies personal history of cancer. Denies changes in bowel or bladder habits. Denies recent unreported falls. Denies new or worsening dyspnea or wheezing. Denies headaches or dizziness.  Reports numbness, tingling or weakness  In the extremities. Paresthesia in the L UE. Denies dizziness or presyncopal episodes Denies lower extremity edema     HISTORY & PERTINENT PRIOR DATA:  Prior History reviewed and updated per electronic medical record.  Significant history, findings, studies and interim changes include:  reports that he has been smoking cigarettes.  He has a 50.00 pack-year smoking history. he has never used smokeless tobacco. Recent Labs    12/09/16 1541 05/02/17 1556 07/11/17 1546  HGBA1C 6.0  --  11.6  LABURIC  --  4.7  --    No specialty comments available. Problem  Herniated Cervical Intervertebral Disc   MRI 07/27/2017: Multilevel degenerative changes most notably at C5-6 with endplate osteophytosis and protruding  disc material at C5-6 on the right and C6-7 on the left correlating well with his symptoms.  No evidence of cervical myelopathy.     OBJECTIVE:  VS:  HT:5\' 10"  (177.8 cm)   WT:216 lb 3.2 oz (98.1 kg)  BMI:31.02    BP:132/76  HR:74bpm  TEMP: ( )  RESP:96 %   Wt Readings from Last 3 Encounters:  09/05/17 216 lb 3.2 oz (98.1 kg)  08/02/17 214 lb 4.8 oz (97.2 kg)  08/01/17 215 lb 9.6 oz (97.8 kg)    PHYSICAL EXAM: Constitutional: WDWN, Non-toxic appearing. Psychiatric: Alert & appropriately interactive. Not depressed or anxious appearing. Respiratory: No increased work of breathing. Trachea Midline Eyes: Pupils are equal. EOM intact without nystagmus. No scleral icterus Cardiovascular:  Peripheral Pulses: peripheral pulses symmetrical No clubbing or cyanosis appreciated Capillary Refill is normal, less than 2 seconds No signficant generalized edema/anasarca Sensory Exam: intact to light touch   Neck:   Well aligned, no significant torticollis  Midline Bony TTP: none   Paraspinal Muscle Spasm: Yes, right  CERVICAL ROM: Limited side bending and rotation to the left.  NEURAL TENSION SIGNS Right Left  Brachial Plexus Squeeze: normal, no pain positive, mild pain  Arm Squeeze Test: normal, no pain positive, moderate pain  Spurling's Compression Test: normal, no pain positive, mild pain  Lhermitte's Compression test: Negative, no radiating pain   REFLEXES Right Left  DTR - C5 -Biceps  2+ 2+  DTR - C6 - Brachiorad 1+ 1+  DTR - C7 - Triceps 1+ 1+  UMN - Hoffman's Negative/Normal Negative/Normal  UMN - Pectoral     MOTOR TESTING: 4/5 strength with elbow flexion on the left with otherwise upper extremity strength 5/5 bilaterally.   ASSESSMENT & PLAN:   1. Herniated cervical intervertebral disc   2. Osteoarthritis of spine with radiculopathy, cervical region   3. Neck pain    PLAN:   >50% of this 25 minute visit spent in direct patient counseling and/or coordination  of care.  Discussion was focused on education regarding the in discussing the pathoetiology and anticipated clinical course of the above condition.  Herniated cervical intervertebral disc Given the persistent symptoms surgical evaluation was offered but he would like to avoid this at all cost.  Alternative options include further evaluation with nerve conduction studies and EMGs to rule out severe radiculopathy.  As long as this is reassuring given the fact that he has really minimal weakness which is more likely related to disuse and truly neurogenic in nature, repeat epidural steroid injection can be considered and this is what he is requesting.  If there is evidence of severe radiculopathy surgical evaluation will be indicated.  We did discuss the possibility of chronic nerve damage with delayed decompression but overall given his current symptoms and physical exam findings that are overall reassuring continue conservative measures are appropriate.  Plan to refer back to Dr. Alvester MorinNewton for EMG/nerve conduction studies and will plan for repeat epidural as long as testing is reassuring.   ++++++++++++++++++++++++++++++++++++++++++++ Orders & Meds: Orders Placed This Encounter  Procedures  . Ambulatory referral to Physical Medicine Rehab    No orders of the defined types were placed in this encounter.   ++++++++++++++++++++++++++++++++++++++++++++ Follow-up: Return in about 3 months (around 12/04/2017).   Pertinent documentation may be included in additional procedure notes, imaging studies, problem based documentation and patient instructions. Please see these sections of the encounter for additional information regarding this visit. CMA/ATC served as Neurosurgeonscribe during this visit. History, Physical, and Plan performed by medical provider. Documentation and orders reviewed and attested to.      Andrena MewsMichael D Giulietta Prokop, DO    [facility]

## 2017-09-05 NOTE — Patient Instructions (Addendum)
I am going to refer you back over to Dr. Alvester MorinNewton for a nerve conduction study and EMG to check on how your nerve is doing.  As long as it looks okay we will plan to repeat an epidural since you had moderate improvement with the last one.  If there are any abnormalities that are significant on the test we will need to have you discuss surgical intervention with 1 of our surgeons.  I will plan to follow-up with you in 12 weeks to ensure that you are doing better.

## 2017-09-15 ENCOUNTER — Encounter (INDEPENDENT_AMBULATORY_CARE_PROVIDER_SITE_OTHER): Payer: Self-pay | Admitting: Physical Medicine and Rehabilitation

## 2017-09-15 ENCOUNTER — Ambulatory Visit (INDEPENDENT_AMBULATORY_CARE_PROVIDER_SITE_OTHER): Payer: BLUE CROSS/BLUE SHIELD | Admitting: Physical Medicine and Rehabilitation

## 2017-09-15 DIAGNOSIS — M5010 Cervical disc disorder with radiculopathy, unspecified cervical region: Secondary | ICD-10-CM

## 2017-09-15 DIAGNOSIS — M5412 Radiculopathy, cervical region: Secondary | ICD-10-CM

## 2017-09-15 DIAGNOSIS — R202 Paresthesia of skin: Secondary | ICD-10-CM | POA: Diagnosis not present

## 2017-09-15 NOTE — Progress Notes (Deleted)
Pt states an on and off pain in left arm with some numbness. Pt states pain has been going on for about 6 weeks. Pt states if he does not use left arm makes it better, working and raising left arm above head makes it worse. Right hand dominant. No lotions or creams.

## 2017-09-19 NOTE — Procedures (Signed)
EMG & NCV Findings: Evaluation of the left median motor and the left ulnar sensory nerves showed reduced amplitude (L4.9, L14.6 V).  All remaining nerves (as indicated in the following tables) were within normal limits.    All examined muscles (as indicated in the following table) showed no evidence of electrical instability.    Impression: Essentially NORMAL electrodiagnostic study of the left upper limb.  There is no significant electrodiagnostic evidence of nerve entrapment, brachial plexopathy or cervical radiculopathy.  **The very borderline reduced amplitudes are likely artifact.  As you know, purely sensory or demyelinating radiculopathies and chemical radiculitis may not be detected with this particular electrodiagnostic study.  Recommendations: 1.  Follow-up with referring physician. 2.  Continue current management of symptoms.  Repeat cervical epidural x1.  Reevaluate intrinsic left shoulder pathology.    Nerve Conduction Studies Anti Sensory Summary Table   Stim Site NR Peak (ms) Norm Peak (ms) P-T Amp (V) Norm P-T Amp Site1 Site2 Delta-P (ms) Dist (cm) Vel (m/s) Norm Vel (m/s)  Left Median Acr Palm Anti Sensory (2nd Digit)  34C  Wrist    3.4 <3.6 16.9 >10 Wrist Palm 1.6 0.0    Palm    1.8 <2.0 17.9         Left Radial Anti Sensory (Base 1st Digit)  33.8C  Wrist    2.1 <3.1 12.9  Wrist Base 1st Digit 2.1 0.0    Left Ulnar Anti Sensory (5th Digit)  34.1C  Wrist    3.2 <3.7 *14.6 >15.0 Wrist 5th Digit 3.2 14.0 44 >38   Motor Summary Table   Stim Site NR Onset (ms) Norm Onset (ms) O-P Amp (mV) Norm O-P Amp Site1 Site2 Delta-0 (ms) Dist (cm) Vel (m/s) Norm Vel (m/s)  Left Median Motor (Abd Poll Brev)  33.9C  Wrist    3.8 <4.2 *4.9 >5 Elbow Wrist 4.4 24.3 55 >50  Elbow    8.2  4.5         Left Ulnar Motor (Abd Dig Min)  34.1C  Wrist    2.7 <4.2 7.5 >3 B Elbow Wrist 3.6 22.0 61 >53  B Elbow    6.3  6.7  A Elbow B Elbow 1.4 10.0 71 >53  A Elbow    7.7  6.3           EMG   Side Muscle Nerve Root Ins Act Fibs Psw Amp Dur Poly Recrt Int Dennie Bible Comment  Left 1stDorInt Ulnar C8-T1 Nml Nml Nml Nml Nml 0 Nml Nml   Left Abd Poll Brev Median C8-T1 Nml Nml Nml Nml Nml 0 Nml Nml   Left ExtDigCom   Nml Nml Nml Nml Nml 0 Nml Nml   Left Triceps Radial C6-7-8 Nml Nml Nml Nml Nml 0 Nml Nml   Left Deltoid Axillary C5-6 Nml Nml Nml Nml Nml 0 Nml Nml     Nerve Conduction Studies Anti Sensory Left/Right Comparison   Stim Site L Lat (ms) R Lat (ms) L-R Lat (ms) L Amp (V) R Amp (V) L-R Amp (%) Site1 Site2 L Vel (m/s) R Vel (m/s) L-R Vel (m/s)  Median Acr Palm Anti Sensory (2nd Digit)  34C  Wrist 3.4   16.9   Wrist Palm     Palm 1.8   17.9         Radial Anti Sensory (Base 1st Digit)  33.8C  Wrist 2.1   12.9   Wrist Base 1st Digit     Ulnar Anti Sensory (5th Digit)  34.1C  Wrist 3.2   *14.6   Wrist 5th Digit 44     Motor Left/Right Comparison   Stim Site L Lat (ms) R Lat (ms) L-R Lat (ms) L Amp (mV) R Amp (mV) L-R Amp (%) Site1 Site2 L Vel (m/s) R Vel (m/s) L-R Vel (m/s)  Median Motor (Abd Poll Brev)  33.9C  Wrist 3.8   *4.9   Elbow Wrist 55    Elbow 8.2   4.5         Ulnar Motor (Abd Dig Min)  34.1C  Wrist 2.7   7.5   B Elbow Wrist 61    B Elbow 6.3   6.7   A Elbow B Elbow 71    A Elbow 7.7   6.3            Waveforms:

## 2017-09-19 NOTE — Progress Notes (Signed)
Tobin Chaddriaan J Pina - 52 y.o. male MRN [id number]  Date of birth: 1966/08/23  Office Visit Note: Visit Date: 09/15/2017 PCP: Kristian CoveyBurchette, Bruce W, MD Referred by: Kristian CoveyBurchette, Bruce W, MD  Subjective: Chief Complaint  Patient presents with  . Left Arm - Pain, Numbness   HPI: [name] is a [age] year old right-hand-dominant gentleman followed by Dr. Berline Choughigby.  He comes in today for electrodiagnostic study of the left upper extremity.  Complaining of chronic worsening left arm pain which appears to be radicular.  It appears possibly to be more of a C6 distribution.  He has no right-sided complaints.  He has had good conservative care with medication management as well as time therapy with Dr. Berline Choughigby.  MRI of the cervical spine was performed and this is reviewed below.  He has multilevel spondylosis left more than right with left foraminal stenosis at multiple levels more than right.  He has had cervical epidural injection which seemed to help to a degree.  He does get a lot of shoulder pain with movement.  He reports 6 weeks of significant pain at this point.  Again if he limits the amount of movement and activity with his left arm it does seem to be better.  He does report that raising his left arm above his head makes his symptoms worse.  His case is complicated by history of diabetes as well as anxiety and depression and alcohol and tobacco abuse.  His last hemoglobin A1c on record was in November 2018 and was 11.6.    ROS Otherwise per HPI.  Assessment & Plan: Visit Diagnoses:  1. Paresthesia of skin   2. Cervical radiculopathy   3. Cervical disc disorder with radiculopathy     Plan: No additional findings.  Impression: Essentially NORMAL electrodiagnostic study of the left upper limb.  There is no significant electrodiagnostic evidence of nerve entrapment, brachial plexopathy or cervical radiculopathy.  **The very borderline reduced amplitudes are likely artifact.  As you know, purely sensory or  demyelinating radiculopathies and chemical radiculitis may not be detected with this particular electrodiagnostic study.  Recommendations: 1.  Follow-up with referring physician. 2.  Continue current management of symptoms.  Repeat cervical epidural x1.  Cervical epidural injection was scheduled in our office.  Reevaluate intrinsic left shoulder pathology.    Meds & Orders: No orders of the defined types were placed in this encounter.   Orders Placed This Encounter  Procedures  . NCV with EMG (electromyography)    Follow-up: Return for Dr. Berline Choughigby.   Procedures: No procedures performed  EMG & NCV Findings: Evaluation of the left median motor and the left ulnar sensory nerves showed reduced amplitude (L4.9, L14.6 V).  All remaining nerves (as indicated in the following tables) were within normal limits.    All examined muscles (as indicated in the following table) showed no evidence of electrical instability.    Impression: Essentially NORMAL electrodiagnostic study of the left upper limb.  There is no significant electrodiagnostic evidence of nerve entrapment, brachial plexopathy or cervical radiculopathy.  **The very borderline reduced amplitudes are likely artifact.  As you know, purely sensory or demyelinating radiculopathies and chemical radiculitis may not be detected with this particular electrodiagnostic study.  Recommendations: 1.  Follow-up with referring physician. 2.  Continue current management of symptoms.  Repeat cervical epidural x1.  Reevaluate intrinsic left shoulder pathology.    Nerve Conduction Studies Anti Sensory Summary Table   Stim Site NR Peak (ms) Norm Peak (ms) P-T Amp (V)  Norm P-T Amp Site1 Site2 Delta-P (ms) Dist (cm) Vel (m/s) Norm Vel (m/s)  Left Median Acr Palm Anti Sensory (2nd Digit)  34C  Wrist    3.4 <3.6 16.9 >10 Wrist Palm 1.6 0.0    Palm    1.8 <2.0 17.9         Left Radial Anti Sensory (Base 1st Digit)  33.8C  Wrist    2.1 <3.1 12.9   Wrist Base 1st Digit 2.1 0.0    Left Ulnar Anti Sensory (5th Digit)  34.1C  Wrist    3.2 <3.7 *14.6 >15.0 Wrist 5th Digit 3.2 14.0 44 >38   Motor Summary Table   Stim Site NR Onset (ms) Norm Onset (ms) O-P Amp (mV) Norm O-P Amp Site1 Site2 Delta-0 (ms) Dist (cm) Vel (m/s) Norm Vel (m/s)  Left Median Motor (Abd Poll Brev)  33.9C  Wrist    3.8 <4.2 *4.9 >5 Elbow Wrist 4.4 24.3 55 >50  Elbow    8.2  4.5         Left Ulnar Motor (Abd Dig Min)  34.1C  Wrist    2.7 <4.2 7.5 >3 B Elbow Wrist 3.6 22.0 61 >53  B Elbow    6.3  6.7  A Elbow B Elbow 1.4 10.0 71 >53  A Elbow    7.7  6.3          EMG   Side Muscle Nerve Root Ins Act Fibs Psw Amp Dur Poly Recrt Int Dennie Bible Comment  Left 1stDorInt Ulnar C8-T1 Nml Nml Nml Nml Nml 0 Nml Nml   Left Abd Poll Brev Median C8-T1 Nml Nml Nml Nml Nml 0 Nml Nml   Left ExtDigCom   Nml Nml Nml Nml Nml 0 Nml Nml   Left Triceps Radial C6-7-8 Nml Nml Nml Nml Nml 0 Nml Nml   Left Deltoid Axillary C5-6 Nml Nml Nml Nml Nml 0 Nml Nml     Nerve Conduction Studies Anti Sensory Left/Right Comparison   Stim Site L Lat (ms) R Lat (ms) L-R Lat (ms) L Amp (V) R Amp (V) L-R Amp (%) Site1 Site2 L Vel (m/s) R Vel (m/s) L-R Vel (m/s)  Median Acr Palm Anti Sensory (2nd Digit)  34C  Wrist 3.4   16.9   Wrist Palm     Palm 1.8   17.9         Radial Anti Sensory (Base 1st Digit)  33.8C  Wrist 2.1   12.9   Wrist Base 1st Digit     Ulnar Anti Sensory (5th Digit)  34.1C  Wrist 3.2   *14.6   Wrist 5th Digit 44     Motor Left/Right Comparison   Stim Site L Lat (ms) R Lat (ms) L-R Lat (ms) L Amp (mV) R Amp (mV) L-R Amp (%) Site1 Site2 L Vel (m/s) R Vel (m/s) L-R Vel (m/s)  Median Motor (Abd Poll Brev)  33.9C  Wrist 3.8   *4.9   Elbow Wrist 55    Elbow 8.2   4.5         Ulnar Motor (Abd Dig Min)  34.1C  Wrist 2.7   7.5   B Elbow Wrist 61    B Elbow 6.3   6.7   A Elbow B Elbow 71    A Elbow 7.7   6.3            Waveforms:            Clinical History: MRI  CERVICAL  SPINE WITHOUT CONTRAST  TECHNIQUE: Multiplanar, multisequence MR imaging of the cervical spine was performed. No intravenous contrast was administered.  COMPARISON:  Outside exam 01/04/2011  FINDINGS: Alignment: Straightening of the normal cervical lordosis. One or 2 mm anterolisthesis C3-4. No significant curvature.  Vertebrae: Chronic discogenic endplate marrow changes C3-4 through C6-7.  Cord: No primary cord lesion.  See below for degenerative changes.  Posterior Fossa, vertebral arteries, paraspinal tissues: Negative. Insignificant posterior nasopharyngeal Thornwaldt cyst.  Disc levels:  Foramen magnum and C1-2 are normal.  C2-3: No disc pathology. Mild facet osteoarthritis but no stenosis or edema.  C3-4: Facet arthropathy on the left allowing 1 or 2 mm of anterolisthesis. Mild bulging of the disc. Foraminal stenosis on the left that could affect the C4 nerve. Foramen on the right widely patent.  C4-5: Mild disc bulge. Facet arthropathy on the left. No central canal stenosis. Foraminal narrowing on the left that could affect the C5 nerve.  C5-6: Bulging of the disc more towards the left. Facet arthropathy on the left. No compressive central canal stenosis. Foraminal stenosis on the left that could compress the C6 nerve.  C6-7: Spondylosis with endplate osteophytes and protruding disc material slightly more prominent towards the right. Effacement of the ventral subarachnoid space but no compression of the cord. Bilateral foraminal stenosis that could affect either or both C7 nerves.  C7-T1:  Normal interspace.  IMPRESSION: In this patient with right-sided symptoms, the most concerning findings are at the C6-7 level were there is chronic spondylosis with endplate osteophytes and protruding disc material. Narrowing of the ventral subarachnoid space but no compression of the cord. Bilateral foraminal stenosis could compress either or both  C7 nerves.  Left-sided predominant spondylosis and facet arthropathy at C3-4, C4-5 and C5-6 with left-sided foraminal narrowing at those levels that could possibly affect the C4, C5 and C6 nerves on the left. I do not see any right foraminal disease of significance in that region.  Compared to the outside exam, the findings appear similar, possibly minimally progressive over time.   Electronically Signed   By: Paulina Fusi M.D.   On: 07/28/2017 08:18  He reports that he has been smoking cigarettes.  He has a 50.00 pack-year smoking history. he has never used smokeless tobacco.  Recent Labs    12/09/16 1541 05/02/17 1556 07/11/17 1546  HGBA1C 6.0  --  11.6  LABURIC  --  4.7  --     Objective:  VS:  HT:    WT:   BMI:     BP:   HR: bpm  TEMP: ( )  RESP:  Physical Exam  Musculoskeletal:  Inspection reveals no atrophy of the bilateral APB or FDI or hand intrinsics. There is no swelling, color changes, allodynia or dystrophic changes. There is 5 out of 5 strength in the bilateral wrist extension, finger abduction and long finger flexion. There is intact sensation to light touch in all dermatomal and peripheral nerve distributions. There is a negative Tinel's test at the bilateral wrist and elbow. There is a negative Phalen's test bilaterally. There is a negative Hoffmann's test bilaterally.  He does seem to have pain in the left shoulder with end ranges of rotation and some impingement.    Ortho Exam Imaging: No results found.  Past Medical/Family/Surgical/Social History: Medications & Allergies reviewed per EMR Patient Active Problem List   Diagnosis Date Noted  . Herniated cervical intervertebral disc 08/01/2017  . Osteoarthritis of spine 08/01/2017  . Diabetes mellitus without complication (  HCC) 07/11/2017  . Polyarthralgia 06/27/2017  . Neck pain 06/27/2017  . Primary osteoarthritis of both knees 05/02/2017  . Sensory neuronopathy 01/14/2017  . Transaminasemia  10/10/2016  . Vitamin B 12 deficiency 09/19/2016  . Hyperglycemia 09/19/2016  . Chest pressure 07/04/2012  . Cough 07/04/2012  . Hyperlipidemia 10/12/2010  . ANXIETY 10/12/2010  . History of alcohol abuse 10/12/2010  . TOBACCO USE 10/12/2010  . DEPRESSION 10/12/2010  . Essential hypertension, benign 10/12/2010  . GERD 10/12/2010  . Osteoarthritis 10/12/2010  . Sleep apnea 10/12/2010   Past Medical History:  Diagnosis Date  . Anxiety   . Depression   . GERD (gastroesophageal reflux disease)   . Headache(784.0)   . Hyperlipidemia   . Hypertension   . Osteoarthritis    Family History  Problem Relation Age of Onset  . Heart attack Father 19  . Hypertension Father   . Arthritis Mother   . Asthma Maternal Grandfather   . Stroke Paternal Grandfather   . Heart disease Other   . Mental illness Other   . Cancer Neg Hx   . Diabetes Neg Hx   . Drug abuse Neg Hx   . Early death Neg Hx   . Hearing loss Neg Hx   . Hyperlipidemia Neg Hx    Past Surgical History:  Procedure Laterality Date  . ROTATOR CUFF REPAIR     Social History   Occupational History  . Not on file  Tobacco Use  . Smoking status: Current Every Day Smoker    Packs/day: 2.00    Years: 25.00    Pack years: 50.00    Types: Cigarettes  . Smokeless tobacco: Never Used  Substance and Sexual Activity  . Alcohol use: Yes    Alcohol/week: 12.0 oz    Types: 20 Glasses of wine per week  . Drug use: No  . Sexual activity: Not Currently

## 2017-09-20 ENCOUNTER — Ambulatory Visit: Payer: BLUE CROSS/BLUE SHIELD | Admitting: Sports Medicine

## 2017-09-21 ENCOUNTER — Other Ambulatory Visit: Payer: Self-pay | Admitting: Family Medicine

## 2017-09-26 ENCOUNTER — Ambulatory Visit: Payer: BLUE CROSS/BLUE SHIELD | Admitting: Sports Medicine

## 2017-09-27 ENCOUNTER — Ambulatory Visit (INDEPENDENT_AMBULATORY_CARE_PROVIDER_SITE_OTHER): Payer: BLUE CROSS/BLUE SHIELD

## 2017-09-27 ENCOUNTER — Ambulatory Visit (INDEPENDENT_AMBULATORY_CARE_PROVIDER_SITE_OTHER): Payer: BLUE CROSS/BLUE SHIELD | Admitting: Physical Medicine and Rehabilitation

## 2017-09-27 ENCOUNTER — Encounter (INDEPENDENT_AMBULATORY_CARE_PROVIDER_SITE_OTHER): Payer: Self-pay | Admitting: Physical Medicine and Rehabilitation

## 2017-09-27 VITALS — BP 131/66 | HR 65 | Temp 98.1°F

## 2017-09-27 DIAGNOSIS — M5010 Cervical disc disorder with radiculopathy, unspecified cervical region: Secondary | ICD-10-CM

## 2017-09-27 DIAGNOSIS — G8929 Other chronic pain: Secondary | ICD-10-CM

## 2017-09-27 DIAGNOSIS — M25512 Pain in left shoulder: Secondary | ICD-10-CM

## 2017-09-27 DIAGNOSIS — M5412 Radiculopathy, cervical region: Secondary | ICD-10-CM

## 2017-09-27 MED ORDER — METHYLPREDNISOLONE ACETATE 80 MG/ML IJ SUSP
80.0000 mg | Freq: Once | INTRAMUSCULAR | Status: AC
  Administered 2017-09-27: 80 mg

## 2017-09-27 NOTE — Progress Notes (Signed)
Tobin Chaddriaan J Peddie - 52 y.o. male MRN [id number]  Date of birth: [birth year]  Office Visit Note: Visit Date: 09/27/2017 PCP: Kristian CoveyBurchette, Bruce W, MD Referred by: Kristian CoveyBurchette, Bruce W, MD  Subjective: Chief Complaint  Patient presents with  . Left Shoulder - Pain   HPI: [name] is a pleasant 52 year old gentleman that we recently saw for electrodiagnostic study which was essentially normal.  This is of the upper extremity.  He continues to have left neck and shoulder pain and pain radiating to the forearm.  His pain is somewhat multifactorial from a clinical standpoint.  He reports neck pain and then pain across the anterior superior part of the shoulder on the left.  He also has a deep aching pain to the biceps and a little bit further down.  Prior epidural injection seem to relieve the bicep type pain for about 50% and then slowly had return of complaints.  Again electrodiagnostic study was essentially normal without any nerve compression in the arm or at least frank radicular denervation signs on needle EMG.  He has a lot of mechanical pain with left shoulder movement.  The patient was hesitant today about the cervical injection.  Even though he had it before with some relief he is still very frustrated with the amount of pain he is working overhead and moving his arm and shoulder.  He reports negative x-rays by Dr. Berline Choughigby.  He is not had a shoulder injection.  I discussed briefly with him and we decided to complete an intra-articular anesthetic arthrogram of the left glenohumeral joint.  I told him I would relay the information to Dr. Berline Choughigby depending on the outcome of the injection.    ROS Otherwise per HPI.  Assessment & Plan: Visit Diagnoses:  1. Chronic left shoulder pain   2. Cervical radiculopathy   3. Cervical disc disorder with radiculopathy     Plan: Findings:  Diagnostic and hopefully therapeutic left anesthetic arthrogram of the glenohumeral joint.  Patient did seem to have some  relief of the anterior superior part of the shoulder pain particularly with raising his arm upward.  He was still pointing to forward flexion with some pain in the biceps region.  I think part of his pain is mechanical in the joint.  He has had prior surgery.  I think he is having some radicular complaints he did get some relief with injection.  A lot of this is frustrating but he does have electrodiagnostic study which is fairly normal.  He will follow-up with Dr. Berline Choughigby in a couple weeks.    Meds & Orders:  Meds ordered this encounter  Medications  . methylPREDNISolone acetate (DEPO-MEDROL) injection 80 mg    Orders Placed This Encounter  Procedures  . Large Joint Inj: L glenohumeral  . XR C-ARM NO REPORT    Follow-up: Return if symptoms worsen or fail to improve.   Procedures: Large Joint Inj: L glenohumeral on 09/27/2017 3:37 PM Indications: pain and diagnostic evaluation Details: 22 G 3.5 in needle, anteromedial approach  Arthrogram: Yes  Medications: 80 mg triamcinolone acetonide 40 MG/ML; 3 mL bupivacaine 0.5 %  Arthrogram demonstrated excellent flow of contrast throughout the joint surface without extravasation or obvious defect.  The patient had relief of symptoms during the anesthetic phase of the injection.  Procedure, treatment alternatives, risks and benefits explained, specific risks discussed. Consent was given by the patient. Immediately prior to procedure a time out was called to verify the correct patient, procedure, equipment, support  staff and site/side marked as required. Patient was prepped and draped in the usual sterile fashion.      No notes on file   Clinical History: MRI CERVICAL SPINE WITHOUT CONTRAST  TECHNIQUE: Multiplanar, multisequence MR imaging of the cervical spine was performed. No intravenous contrast was administered.  COMPARISON:  Outside exam 01/04/2011  FINDINGS: Alignment: Straightening of the normal cervical lordosis. One or 2 mm  anterolisthesis C3-4. No significant curvature.  Vertebrae: Chronic discogenic endplate marrow changes C3-4 through C6-7.  Cord: No primary cord lesion.  See below for degenerative changes.  Posterior Fossa, vertebral arteries, paraspinal tissues: Negative. Insignificant posterior nasopharyngeal Thornwaldt cyst.  Disc levels:  Foramen magnum and C1-2 are normal.  C2-3: No disc pathology. Mild facet osteoarthritis but no stenosis or edema.  C3-4: Facet arthropathy on the left allowing 1 or 2 mm of anterolisthesis. Mild bulging of the disc. Foraminal stenosis on the left that could affect the C4 nerve. Foramen on the right widely patent.  C4-5: Mild disc bulge. Facet arthropathy on the left. No central canal stenosis. Foraminal narrowing on the left that could affect the C5 nerve.  C5-6: Bulging of the disc more towards the left. Facet arthropathy on the left. No compressive central canal stenosis. Foraminal stenosis on the left that could compress the C6 nerve.  C6-7: Spondylosis with endplate osteophytes and protruding disc material slightly more prominent towards the right. Effacement of the ventral subarachnoid space but no compression of the cord. Bilateral foraminal stenosis that could affect either or both C7 nerves.  C7-T1:  Normal interspace.  IMPRESSION: In this patient with right-sided symptoms, the most concerning findings are at the C6-7 level were there is chronic spondylosis with endplate osteophytes and protruding disc material. Narrowing of the ventral subarachnoid space but no compression of the cord. Bilateral foraminal stenosis could compress either or both C7 nerves.  Left-sided predominant spondylosis and facet arthropathy at C3-4, C4-5 and C5-6 with left-sided foraminal narrowing at those levels that could possibly affect the C4, C5 and C6 nerves on the left. I do not see any right foraminal disease of significance in  that region.  Compared to the outside exam, the findings appear similar, possibly minimally progressive over time.   Electronically Signed   By: Paulina Fusi M.D.   On: 07/28/2017 08:18  He reports that he has been smoking cigarettes.  He has a 50.00 pack-year smoking history. he has never used smokeless tobacco.  Recent Labs    12/09/16 1541 05/02/17 1556 07/11/17 1546  HGBA1C 6.0  --  11.6  LABURIC  --  4.7  --     Objective:  VS:  HT:    WT:   BMI:     BP:131/66  HR:65bpm  TEMP:98.1 F (36.7 C)(Oral)  RESP:97 % Physical Exam  Musculoskeletal:  Patient seems to have some impingement signs on the left and he does have pain with abduction and reaching overhead and this is more of the anterior shoulder pain.  When he flexes and goes to the midline medially he has some pain in the biceps region.  He has good strength in the musculature bilaterally.  He has 2+ muscle stretch reflexes at the biceps and brachioradialis and he has a negative Hoffman's test bilaterally.    Ortho Exam Imaging: Xr C-arm No Report  Result Date: 09/27/2017 Please see Notes or Procedures tab for imaging impression.   Past Medical/Family/Surgical/Social History: Medications & Allergies reviewed per EMR Patient Active Problem List  Diagnosis Date Noted  . Herniated cervical intervertebral disc 08/01/2017  . Osteoarthritis of spine 08/01/2017  . Diabetes mellitus without complication (HCC) 07/11/2017  . Polyarthralgia 06/27/2017  . Neck pain 06/27/2017  . Primary osteoarthritis of both knees 05/02/2017  . Sensory neuronopathy 01/14/2017  . Transaminasemia 10/10/2016  . Vitamin B 12 deficiency 09/19/2016  . Hyperglycemia 09/19/2016  . Chest pressure 07/04/2012  . Cough 07/04/2012  . Hyperlipidemia 10/12/2010  . ANXIETY 10/12/2010  . History of alcohol abuse 10/12/2010  . TOBACCO USE 10/12/2010  . DEPRESSION 10/12/2010  . Essential hypertension, benign 10/12/2010  . GERD 10/12/2010  .  Osteoarthritis 10/12/2010  . Sleep apnea 10/12/2010   Past Medical History:  Diagnosis Date  . Anxiety   . Depression   . GERD (gastroesophageal reflux disease)   . Headache(784.0)   . Hyperlipidemia   . Hypertension   . Osteoarthritis    Family History  Problem Relation Age of Onset  . Heart attack Father 65  . Hypertension Father   . Arthritis Mother   . Asthma Maternal Grandfather   . Stroke Paternal Grandfather   . Heart disease Other   . Mental illness Other   . Cancer Neg Hx   . Diabetes Neg Hx   . Drug abuse Neg Hx   . Early death Neg Hx   . Hearing loss Neg Hx   . Hyperlipidemia Neg Hx    Past Surgical History:  Procedure Laterality Date  . ROTATOR CUFF REPAIR     Social History   Occupational History  . Not on file  Tobacco Use  . Smoking status: Current Every Day Smoker    Packs/day: 2.00    Years: 25.00    Pack years: 50.00    Types: Cigarettes  . Smokeless tobacco: Never Used  Substance and Sexual Activity  . Alcohol use: Yes    Alcohol/week: 12.0 oz    Types: 20 Glasses of wine per week  . Drug use: No  . Sexual activity: Not Currently

## 2017-09-27 NOTE — Patient Instructions (Signed)

## 2017-09-27 NOTE — Progress Notes (Deleted)
Pt states pain in left shoulder. Pt states no changes pain is still the same. Pt states last injection helped about 50% relief. +Driver, -BT, -Dye Allergies.

## 2017-09-28 MED ORDER — TRIAMCINOLONE ACETONIDE 40 MG/ML IJ SUSP
80.0000 mg | INTRAMUSCULAR | Status: AC | PRN
  Administered 2017-09-27: 80 mg via INTRA_ARTICULAR

## 2017-09-28 MED ORDER — BUPIVACAINE HCL 0.5 % IJ SOLN
3.0000 mL | INTRAMUSCULAR | Status: AC | PRN
  Administered 2017-09-27: 3 mL via INTRA_ARTICULAR

## 2017-10-17 ENCOUNTER — Encounter: Payer: Self-pay | Admitting: Sports Medicine

## 2017-10-17 ENCOUNTER — Ambulatory Visit: Payer: BLUE CROSS/BLUE SHIELD | Admitting: Sports Medicine

## 2017-10-17 VITALS — BP 120/78 | HR 70 | Ht 70.0 in | Wt 211.8 lb

## 2017-10-17 DIAGNOSIS — M4722 Other spondylosis with radiculopathy, cervical region: Secondary | ICD-10-CM | POA: Diagnosis not present

## 2017-10-17 DIAGNOSIS — M542 Cervicalgia: Secondary | ICD-10-CM | POA: Diagnosis not present

## 2017-10-17 DIAGNOSIS — M5020 Other cervical disc displacement, unspecified cervical region: Secondary | ICD-10-CM

## 2017-10-17 DIAGNOSIS — F17200 Nicotine dependence, unspecified, uncomplicated: Secondary | ICD-10-CM

## 2017-10-17 DIAGNOSIS — I739 Peripheral vascular disease, unspecified: Secondary | ICD-10-CM | POA: Diagnosis not present

## 2017-10-17 NOTE — Assessment & Plan Note (Signed)
Subjective Report:  Pain in calf cramping with walking, typically 100-150 m before having to stop.    With cessation of walking symptoms resolve and is able to walk once again 100-1729m     Assessment & Plan & Follow up Issues:  Chronic, worsening condition  -poor pulses on exam 1. Segmental ABIs 2. Consider Pletal versus Plavix versus referral for intervention.   3. Will plan to coordinate with Dr. Caryl NeverBurchette pending findings   4. Smoking cessation encouraged

## 2017-10-17 NOTE — Assessment & Plan Note (Signed)
Significantly improved.  No longer having any neck or radicular or shoulder pain following epidural steroid injection and left glenohumeral injection.  Nerve conduction studies confirmed that the persistent weakness is having is non-neurogenic in nature and further intervention can be deferred.

## 2017-10-17 NOTE — Assessment & Plan Note (Signed)
Tobacco cessation recommended.

## 2017-10-17 NOTE — Progress Notes (Signed)
[name]. [name] [place] [phone]  Jason [name] - 52 y.o. male MRN [id number]  Date of birth: 29-Sep-1965  Visit Date: 10/17/2017  PCP: [name], MD   Referred by: [name], MD   Scribe for today's visit: Stevenson Clinch, CMA     SUBJECTIVE:  Jason [name] is here for Follow-up (cervical radiculopathy with L shoulder/arm pain)  09/05/17: Compared to the last office visit on 08/01/17, his previously described neck and radiating L arm symptoms show only mild change since his epidural.  He is describing a feeling of "warmth" in his L UE from the shoulder to elbow. Current symptoms are moderate & are radiating to L UE, mainly to the elbow. He has been to see Dr. Alvester Morin on 08/14/17 and received a C7-T1 epidural injection.  He is taking Gabapentin, Celebrex and Cymbalta.    10/17/17 Compared to the last office visit, his previously described symptoms are improving.  Current symptoms have resolved.  He has been taking Gabapentin, Celebrex, and Cymbalta.   Epidural inj with Dr. Alvester Morin 08/14/17 NCV 09/15/17. L shoulder inj 09/27/17  He continues to have cramping in his calves. He does a lot of walking and the pain gets really severe. He has increased intake of potassium rich foods and hasn't noticed any improvement. Pain eventually eases off after standing still for a few minutes. This has been going on for quite a while now. He denies erythema, swelling, increased warmth. He wife has noticed his legs shaking while he sleeps but pt reports that he was not aware.    ROS Reports night time disturbances. Denies fevers, chills, or night sweats. Denies unexplained weight loss. Denies personal history of cancer. Denies changes in bowel or bladder habits. Denies recent unreported falls. Denies new or worsening dyspnea or wheezing. Denies headaches or dizziness.  Denies numbness, tingling or weakness  In the  extremities.  Denies dizziness or presyncopal episodes Denies lower extremity edema     HISTORY & PERTINENT PRIOR DATA:  Prior History reviewed and updated per electronic medical record.  Significant history, findings, studies and interim changes include:  reports that he has been smoking cigarettes.  He has a 50.00 pack-year smoking history. he has never used smokeless tobacco. Recent Labs    12/09/16 1541 05/02/17 1556 07/11/17 1546  HGBA1C 6.0  --  11.6  LABURIC  --  4.7  --    No specialty comments available. Problem  Intermittent Claudication (Hcc)  Neck Pain  TOBACCO USE    OBJECTIVE:  VS:  HT:5\' 10"  (177.8 cm)   WT:211 lb 12.8 oz (96.1 kg)  BMI:30.39    BP:120/78  HR:70bpm  TEMP: ( )  RESP:96 %   PHYSICAL EXAM: Constitutional: WDWN, Non-toxic appearing. Psychiatric: Alert & appropriately interactive.  Not depressed or anxious appearing. Respiratory: No increased work of breathing.  Trachea Midline Eyes: Pupils are equal.  EOM intact without nystagmus.  No scleral icterus  NEUROVASCULAR exam: No clubbing or cyanosis appreciated No significant venous stasis changes Capillary Refill: normal, less than 2 seconds   Bilateral lower extremities: Sparse hair.  He has negative straight leg raise.  His pulses are faint to not palpable in both DP and PT pulses.  Capillary refill is 3-4 seconds.  Otherwise lower extremities have 5/5 strength and exhibit no dermatomal dysesthesia.  He has no persistent pain with arm squeeze test or brachial plexus squeeze.  He has good cervical  range of motion and full overhead range of motion of the shoulder.   ASSESSMENT & PLAN:   1. Intermittent claudication (HCC)   2. Osteoarthritis of spine with radiculopathy, cervical region   3. Herniated cervical intervertebral disc   4. Neck pain   5. TOBACCO USE     ++++++++++++++++++++++++++++++++++++++++++++ PLAN:   Findings:  See problem based documentation   TOBACCO USE Tobacco  cessation recommended.  Neck pain Significantly improved.  No longer having any neck or radicular or shoulder pain following epidural steroid injection and left glenohumeral injection.  Nerve conduction studies confirmed that the persistent weakness is having is non-neurogenic in nature and further intervention can be deferred.  Intermittent claudication (HCC)    Subjective Report:  Pain in calf cramping with walking, typically 100-150 m before having to stop.    With cessation of walking symptoms resolve and is able to walk once again [phone]     Assessment & Plan & Follow up Issues:  Chronic, worsening condition  -poor pulses on exam 1. Segmental ABIs 2. Consider Pletal versus Plavix versus referral for intervention.   3. Will plan to coordinate with Dr. Caryl NeverBurchette pending findings   4. Smoking cessation encouraged    Follow-up: Return for after you get your ABI test.   Pertinent documentation may be included in additional procedure notes, imaging studies, problem based documentation and patient instructions. Please see these sections of the encounter for additional information regarding this visit. CMA/ATC served as Neurosurgeonscribe during this visit. History, Physical, and Plan performed by medical provider. Documentation and orders reviewed and attested to.      Andrena MewsMichael D Takirah Binford, DO    [facility] Physician

## 2017-10-19 ENCOUNTER — Ambulatory Visit (HOSPITAL_COMMUNITY)
Admission: RE | Admit: 2017-10-19 | Discharge: 2017-10-19 | Disposition: A | Discharge status: Home / Self Care | Payer: BLUE CROSS/BLUE SHIELD | Source: Ambulatory Visit | Attending: Cardiology | Admitting: Cardiology

## 2017-10-19 DIAGNOSIS — I739 Peripheral vascular disease, unspecified: Secondary | ICD-10-CM | POA: Insufficient documentation

## 2017-10-22 ENCOUNTER — Encounter: Payer: Self-pay | Admitting: Sports Medicine

## 2017-10-23 ENCOUNTER — Encounter: Payer: Self-pay | Admitting: Family Medicine

## 2017-10-23 ENCOUNTER — Ambulatory Visit: Payer: BLUE CROSS/BLUE SHIELD | Admitting: Family Medicine

## 2017-10-23 ENCOUNTER — Ambulatory Visit: Payer: Self-pay

## 2017-10-23 VITALS — BP 120/70 | HR 82 | Temp 98.6°F | Wt 214.3 lb

## 2017-10-23 DIAGNOSIS — E119 Type 2 diabetes mellitus without complications: Secondary | ICD-10-CM

## 2017-10-23 DIAGNOSIS — I739 Peripheral vascular disease, unspecified: Secondary | ICD-10-CM | POA: Diagnosis not present

## 2017-10-23 DIAGNOSIS — L03116 Cellulitis of left lower limb: Secondary | ICD-10-CM | POA: Diagnosis not present

## 2017-10-23 DIAGNOSIS — L02416 Cutaneous abscess of left lower limb: Secondary | ICD-10-CM | POA: Diagnosis not present

## 2017-10-23 DIAGNOSIS — Z8614 Personal history of Methicillin resistant Staphylococcus aureus infection: Secondary | ICD-10-CM

## 2017-10-23 LAB — POCT GLYCOSYLATED HEMOGLOBIN (HGB A1C): Hemoglobin A1C: 7.5

## 2017-10-23 MED ORDER — DOXYCYCLINE HYCLATE 100 MG PO CAPS: 100.0000 mg | ORAL_CAPSULE | Freq: Two times a day (BID) | ORAL | 0 refills | Status: DC

## 2017-10-23 MED ORDER — CILOSTAZOL 100 MG PO TABS: 100.0000 mg | ORAL_TABLET | Freq: Two times a day (BID) | ORAL | 5 refills | Status: DC

## 2017-10-23 NOTE — Progress Notes (Signed)
My chart message sent to the patient as below:  The test of your blood vessels does show that you have blockages in the vessels in your legs that are causing the cramping.  We can try a medication to see if this is beneficial or refer you to a vascular surgeon to discuss potential interventions.  I have touch base with Dr. Caryl NeverBurchette and he is aware of the results.  Please let me know which she would like to do regarding medications or referral to a vascular specialist.

## 2017-10-23 NOTE — Patient Instructions (Signed)
Peripheral Vascular Disease Peripheral vascular disease (PVD) is a disease of the blood vessels that are not part of your heart and brain. A simple term for PVD is poor circulation. In most cases, PVD narrows the blood vessels that carry blood from your heart to the rest of your body. This can result in a decreased supply of blood to your arms, legs, and internal organs, like your stomach or kidneys. However, it most often affects a person's lower legs and feet. There are two types of PVD.  Organic PVD. This is the more common type. It is caused by damage to the structure of blood vessels.  Functional PVD. This is caused by conditions that make blood vessels contract and tighten (spasm).  Without treatment, PVD tends to get worse over time. PVD can also lead to acute ischemic limb. This is when an arm or limb suddenly has trouble getting enough blood. This is a medical emergency. What are the causes? Each type of PVD has many different causes. The most common cause of PVD is buildup of a fatty material (plaque) inside of your arteries (atherosclerosis). Small amounts of plaque can break off from the walls of the blood vessels and become lodged in a smaller artery. This blocks blood flow and can cause acute ischemic limb. Other common causes of PVD include:  Blood clots that form inside of blood vessels.  Injuries to blood vessels.  Diseases that cause inflammation of blood vessels or cause blood vessel spasms.  Health behaviors and health history that increase your risk of developing PVD.  What increases the risk? You may have a greater risk of PVD if you:  Have a family history of PVD.  Have certain medical conditions, including: ? High cholesterol. ? Diabetes. ? High blood pressure (hypertension). ? Coronary heart disease. ? Past problems with blood clots. ? Past injury, such as burns or a broken bone. These may have damaged blood vessels in your limbs. ? Buerger disease. This is  caused by inflamed blood vessels in your hands and feet. ? Some forms of arthritis. ? Rare birth defects that affect the arteries in your legs.  Use tobacco.  Do not get enough exercise.  Are obese.  Are age 50 or older.  What are the signs or symptoms? PVD may cause many different symptoms. Your symptoms depend on what part of your body is not getting enough blood. Some common signs and symptoms include:  Cramps in your lower legs. This may be a symptom of poor leg circulation (claudication).  Pain and weakness in your legs while you are physically active that goes away when you rest (intermittent claudication).  Leg pain when at rest.  Leg numbness, tingling, or weakness.  Coldness in a leg or foot, especially when compared with the other leg.  Skin or hair changes. These can include: ? Hair loss. ? Shiny skin. ? Pale or bluish skin. ? Thick toenails.  Inability to get or maintain an erection (erectile dysfunction).  People with PVD are more prone to developing ulcers and sores on their toes, feet, or legs. These may take longer than normal to heal. How is this diagnosed? Your health care provider may diagnose PVD from your signs and symptoms. The health care provider will also do a physical exam. You may have tests to find out what is causing your PVD and determine its severity. Tests may include:  Blood pressure recordings from your arms and legs and measurements of the strength of your pulses (  pulse volume recordings).  Imaging studies using sound waves to take pictures of the blood flow through your blood vessels (Doppler ultrasound).  Injecting a dye into your blood vessels before having imaging studies using: ? X-rays (angiogram or arteriogram). ? Computer-generated X-rays (CT angiogram). ? A powerful electromagnetic field and a computer (magnetic resonance angiogram or MRA).  How is this treated? Treatment for PVD depends on the cause of your condition and the  severity of your symptoms. It also depends on your age. Underlying causes need to be treated and controlled. These include long-lasting (chronic) conditions, such as diabetes, high cholesterol, and high blood pressure. You may need to first try making lifestyle changes and taking medicines. Surgery may be needed if these do not work. Lifestyle changes may include:  Quitting smoking.  Exercising regularly.  Following a low-fat, low-cholesterol diet.  Medicines may include:  Blood thinners to prevent blood clots.  Medicines to improve blood flow.  Medicines to improve your blood cholesterol levels.  Surgical procedures may include:  A procedure that uses an inflated balloon to open a blocked artery and improve blood flow (angioplasty).  A procedure to put in a tube (stent) to keep a blocked artery open (stent implant).  Surgery to reroute blood flow around a blocked artery (peripheral bypass surgery).  Surgery to remove dead tissue from an infected wound on the affected limb.  Amputation. This is surgical removal of the affected limb. This may be necessary in cases of acute ischemic limb that are not improved through medical or surgical treatments.  Follow these instructions at home:  Take medicines only as directed by your health care provider.  Do not use any tobacco products, including cigarettes, chewing tobacco, or electronic cigarettes. If you need help quitting, ask your health care provider.  Lose weight if you are overweight, and maintain a healthy weight as directed by your health care provider.  Eat a diet that is low in fat and cholesterol. If you need help, ask your health care provider.  Exercise regularly. Ask your health care provider to suggest some good activities for you.  Use compression stockings or other mechanical devices as directed by your health care provider.  Take good care of your feet. ? Wear comfortable shoes that fit well. ? Check your feet  often for any cuts or sores. Contact a health care provider if:  You have cramps in your legs while walking.  You have leg pain when you are at rest.  You have coldness in a leg or foot.  Your skin changes.  You have erectile dysfunction.  You have cuts or sores on your feet that are not healing. Get help right away if:  Your arm or leg turns cold and blue.  Your arms or legs become red, warm, swollen, painful, or numb.  You have chest pain or trouble breathing.  You suddenly have weakness in your face, arm, or leg.  You become very confused or lose the ability to speak.  You suddenly have a very bad headache or lose your vision. This information is not intended to replace advice given to you by your health care provider. Make sure you discuss any questions you have with your health care provider. Document Released: 09/22/2004 Document Revised: 01/21/2016 Document Reviewed: 01/23/2014 Elsevier Interactive Patient Education  2017 [employer]  STOP smoking Start the [employer] Specialty ChemicalsPletal Start back the Lipitor

## 2017-10-23 NOTE — Telephone Encounter (Signed)
Pt.'s wife calling for pt. - he is at work and is not available until later today. States his right lower leg has an area on the side of the calf that has a pin prick area that has some drainage. The leg is swollen and painful. Wife states "it looks like the area on his back that had MRSA." She is calling to schedule an appointment for him for later today due to his work schedule. Reason for Disposition . Looks like a boil, infected sore, deep ulcer or other infected rash (spreading redness, pus)  Answer Assessment - Initial Assessment Questions 1. ONSET: "When did the swelling start?" (e.g., minutes, hours, days)     Thursday 2. LOCATION: "What part of the leg is swollen?"  "Are both legs swollen or just one leg?"     Right lower leg - at calf on the side 3. SEVERITY: "How bad is the swelling?" (e.g., localized; mild, moderate, severe)  - Localized - small area of swelling localized to one leg  - MILD pedal edema - swelling limited to foot and ankle, pitting edema < 1/4 inch (6 mm) deep, rest and elevation eliminate most or all swelling  - MODERATE edema - swelling of lower leg to knee, pitting edema > 1/4 inch (6 mm) deep, rest and elevation only partially reduce swelling  - SEVERE edema - swelling extends above knee, facial or hand swelling present      Around the area 4. REDNESS: "Does the swelling look red or infected?"     Yes 5. PAIN: "Is the swelling painful to touch?" If so, ask: "How painful is it?"   (Scale 1-10; mild, moderate or severe)     Moderate 6. FEVER: "Do you have a fever?" If so, ask: "What is it, how was it measured, and when did it start?"      Unsure 7. CAUSE: "What do you think is causing the leg swelling?"     Maybe MRSA per wife 8. MEDICAL HISTORY: "Do you have a history of heart failure, kidney disease, liver failure, or cancer?"     Yes 9. RECURRENT SYMPTOM: "Have you had leg swelling before?" If so, ask: "When was the last time?" "What happened that time?"  No 10. OTHER SYMPTOMS: "Do you have any other symptoms?" (e.g., chest pain, difficulty breathing)       No 11. PREGNANCY: "Is there any chance you are pregnant?" "When was your last menstrual period?"       No  Protocols used: LEG SWELLING AND EDEMA-A-AH

## 2017-10-23 NOTE — Progress Notes (Signed)
Subjective:     Patient ID: Jason [name], male   DOB: 03/15/1966, 52 y.o.   MRN: [id number]  HPI Patient is here for several issues as follows  History of MRSA in the past. He states his left leg developed an erythematous swollen area last Thursday. Started draining yesterday. Redness has actually gone down slightly- since yesterday. No fevers or chills. Denies any recent injury.  Patient had been complaining of frequent "leg cramps ". His job requires lots of physical exertion ambulation and symptoms were becoming more prevalent with walking. He was seen a sports medicine and sent for arterial Dopplers which did confirm peripheral vascular disease right side greater than left. He has multiple risk factors including ongoing nicotine use, hyperlipidemia, history of hypertension, type 2 diabetes  Poor compliance of medication. He should be on Lipitor but stopped. Does not take any aspirin or antiplatelet medications. His claudication symptoms are very much limiting his work and frequent has pain after ambulating relatively short distances.. Still smokes about 40 cigarettes per day  Past hx of ETOH abuse and has been clean of that for several months.  Past Medical History:  Diagnosis Date  . Anxiety   . Depression   . GERD (gastroesophageal reflux disease)   . Headache(784.0)   . Hyperlipidemia   . Hypertension   . Osteoarthritis    Past Surgical History:  Procedure Laterality Date  . ROTATOR CUFF REPAIR      reports that he has been smoking cigarettes.  He has a 50.00 pack-year smoking history. he has never used smokeless tobacco. He reports that he drinks about 12.0 oz of alcohol per week. He reports that he does not use drugs. family history includes Arthritis in his mother; Asthma in his maternal grandfather; Heart attack (age of onset: 72) in his father; Heart disease in his other; Hypertension in his father; Mental illness in his other; Stroke in his paternal  grandfather. Allergies  Allergen Reactions  . Olmesartan Medoxomil     REACTION: dizziness     Review of Systems  Constitutional: Negative for chills, fatigue and fever.  Eyes: Negative for visual disturbance.  Respiratory: Negative for cough, chest tightness and shortness of breath.   Cardiovascular: Negative for chest pain and palpitations.  Endocrine: Negative for polydipsia and polyuria.  Neurological: Negative for dizziness, syncope, weakness, light-headedness and headaches.       Objective:   Physical Exam  Constitutional: He appears well-developed and well-nourished.  Cardiovascular: Normal rate and regular rhythm.  Feet are both warm to touch. He has trace dorsalis pedis pulse bilaterally. Good capillary refill bilaterally.  Pulmonary/Chest: Effort normal and breath sounds normal. No respiratory distress. He has no wheezes. He has no rales.  Skin:  Patient has abscess left anterior leg with surrounding area of erythema about 6 x 6 cm. He has some induration of the center and this is draining some but no fluctuance. Minimally tender.       Assessment:     #1 cellulitis and draining abscess left anterior leg. High likelihood of recurrent MRSA. Patient nontoxic.  This is non-fluctuant.  #2 type 2 diabetes improving with A1c today 7.5% which is down from 11 back in November  #3 peripheral artery disease by recent Doppler-multiple risk factors as above  #4 hypertension stable  #5 ongoing nicotine use  #6 dyslipidemia    Plan:     -Start doxycycline 100 mg twice daily for 10 days -Follow-up promptly for any fever or worsening cellulitis changes -  There is no fluctuance or any indication for incision and drainage today. -Strongly advised to stop smoking altogether -Consider trial of Pletal 100 mg twice daily -Start back Lipitor -Set up referral to vascular surgery regarding his peripheral artery disease  Kristian CoveyBruce W Rehmat Murtagh MD [facility]

## 2017-10-23 NOTE — Progress Notes (Signed)
Dr. Caryl NeverBurchette.  He was reporting intermittent claudication symptoms so I went ahead and ordered the test.  Obviously he has fairly significant disease.  Before referring him to further subspecialty I wanted to touch base.  I'm Happy to start him on Pletal and see how he does with this as I do not think he wishes for any further intervention at this time and is not demonstrating any type of rest pain.  If you would like to assume management of this please let me know.  His arm and shoulder are doing significantly better and I appreciate the referral.      Andrena MewsMichael D Rigby, DO    [facility]

## 2017-10-31 ENCOUNTER — Ambulatory Visit: Payer: BLUE CROSS/BLUE SHIELD | Admitting: Family Medicine

## 2017-11-15 ENCOUNTER — Encounter: Payer: Self-pay | Admitting: Family Medicine

## 2017-11-17 MED ORDER — TRAZODONE HCL 50 MG PO TABS: ORAL_TABLET | ORAL | 3 refills | Status: DC

## 2017-11-30 ENCOUNTER — Ambulatory Visit: Payer: BLUE CROSS/BLUE SHIELD | Admitting: Vascular Surgery

## 2017-11-30 ENCOUNTER — Encounter: Payer: Self-pay | Admitting: *Deleted

## 2017-11-30 ENCOUNTER — Encounter: Payer: Self-pay | Admitting: Vascular Surgery

## 2017-11-30 ENCOUNTER — Other Ambulatory Visit: Payer: Self-pay | Admitting: *Deleted

## 2017-11-30 ENCOUNTER — Other Ambulatory Visit: Payer: Self-pay

## 2017-11-30 VITALS — BP 140/71 | HR 77 | Temp 99.3°F | Resp 16 | Ht 70.0 in | Wt 212.0 lb

## 2017-11-30 DIAGNOSIS — I739 Peripheral vascular disease, unspecified: Secondary | ICD-10-CM | POA: Diagnosis not present

## 2017-11-30 NOTE — H&P (View-Only) (Signed)
  Referring Physician: Dr Bruce Burchette  Patient name: Hiran J Privitera MRN: [id number] DOB: 02/04/1966 Sex: male  REASON FOR CONSULT: Bilateral lower extremity claudication HPI: Nidal J Canaday is a 52 y.o. male, a several month history of pain in both calves when walking.  This usually occurs at about 100 yards.  He has been slowed down at work secondary to this.  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  He has to stop during his job several times due to his calf pain.  He denies any rest pain in his feet or nonhealing wounds.  However he does have approximately 1 year history of a burning and cold sensation in his feet consistent with peripheral neuropathy.  He was placed on Neurontin recently by Dr. Burchette and has had some improvement in the symptoms.  He is currently on Pletal but still has not really experienced any improvement in his walking distance.  Several miles every day but has to stop multiple times.  Currently smokes greater than 2 packs of cigarettes per day.  Greater than 3 minutes today spent regarding smoking cessation counseling.  He is on aspirin daily.  He also has a history of diabetes.  He is on Lipitor and aspirin.  Other medical problems include anxiety depression hyperlipidemia hypertension all of which are currently stable.  Past Medical History:  Diagnosis Date  . Anxiety   . Depression   . GERD (gastroesophageal reflux disease)   . Headache(784.0)   . Hyperlipidemia   . Hypertension   . Osteoarthritis    Past Surgical History:  Procedure Laterality Date  . ROTATOR CUFF REPAIR      Family History  Problem Relation Age of Onset  . Heart attack Father 48  . Hypertension Father   . Arthritis Mother   . Asthma Maternal Grandfather   . Stroke Paternal Grandfather   . Heart disease Other   . Mental illness Other   . Cancer Neg Hx   . Diabetes Neg Hx   . Drug abuse Neg Hx   . Early death Neg Hx   . Hearing loss Neg Hx   .  Hyperlipidemia Neg Hx     SOCIAL HISTORY: Social History   Socioeconomic History  . Marital status: Married    Spouse name: Not on file  . Number of children: Not on file  . Years of education: Not on file  . Highest education level: Not on file  Occupational History  . Not on file  Social Needs  . Financial resource strain: Not on file  . Food insecurity:    Worry: Not on file    Inability: Not on file  . Transportation needs:    Medical: Not on file    Non-medical: Not on file  Tobacco Use  . Smoking status: Current Every Day Smoker    Packs/day: 2.00    Years: 25.00    Pack years: 50.00    Types: Cigarettes  . Smokeless tobacco: Never Used  Substance and Sexual Activity  . Alcohol use: Yes    Alcohol/week: 12.0 oz    Types: 20 Glasses of wine per week  . Drug use: No  . Sexual activity: Not Currently  Lifestyle  . Physical activity:    Days per week: Not on file    Minutes per session: Not on file  . Stress: Not on file  Relationships  . Social connections:    Talks on phone: Not   on file    Gets together: Not on file    Attends religious service: Not on file    Active member of club or organization: Not on file    Attends meetings of clubs or organizations: Not on file    Relationship status: Not on file  . Intimate partner violence:    Fear of current or ex partner: Not on file    Emotionally abused: Not on file    Physically abused: Not on file    Forced sexual activity: Not on file  Other Topics Concern  . Not on file  Social History Narrative  . Not on file    Allergies  Allergen Reactions  . Olmesartan Medoxomil     REACTION: dizziness    Current Outpatient Medications  Medication Sig Dispense Refill  . atorvastatin (LIPITOR) 20 MG tablet TAKE 1 TABLET BY MOUTH EVERY DAY 90 tablet 1  . Blood Glucose Monitoring Suppl (CONTOUR BLOOD GLUCOSE SYSTEM) DEVI Use once daily for diabetes Dx E11.9 1 Device 0  . celecoxib (CELEBREX) 100 MG capsule  Take 1 capsule (100 mg total) by mouth 2 (two) times daily as needed. 180 capsule 0  . cilostazol (PLETAL) 100 MG tablet Take 1 tablet (100 mg total) by mouth 2 (two) times daily. 60 tablet 5  . doxycycline (VIBRAMYCIN) 100 MG capsule Take 1 capsule (100 mg total) by mouth 2 (two) times daily. 20 capsule 0  . DULoxetine (CYMBALTA) 30 MG capsule Take 1 capsule (30 mg total) by mouth daily. 30 capsule 3  . gabapentin (NEURONTIN) 300 MG capsule Take 3 po in AM and 3 po QHS. 540 capsule 1  . glucose blood (BAYER CONTOUR TEST) test strip Test glucose once daily.  Dx E 11.9 100 each 3  . Lancets MISC Contour Lancets.  Test glucose once daily. DxE11.9 100 each 3  . metFORMIN (GLUCOPHAGE) 500 MG tablet Take two tablets twice daily. 360 tablet 3  . traZODone (DESYREL) 50 MG tablet Take 1 tablet by mouth at bedtime as needed for insomnia 30 tablet 3   No current facility-administered medications for this visit.     ROS:   General:  No weight loss, Fever, chills  HEENT: No recent headaches, no nasal bleeding, no visual changes, no sore throat  Neurologic: No dizziness, blackouts, seizures. No recent symptoms of stroke or mini- stroke. No recent episodes of slurred speech, or temporary blindness.  Cardiac: No recent episodes of chest pain/pressure, no shortness of breath at rest.  No shortness of breath with exertion.  Denies history of atrial fibrillation or irregular heartbeat  Vascular: No history of rest pain in feet.  + history of claudication.  No history of non-healing ulcer, No history of DVT   Pulmonary: No home oxygen, no productive cough, no hemoptysis,  No asthma or wheezing  Musculoskeletal:  [X] Arthritis, [ ] Low back pain,  [X] Joint pain  Hematologic:No history of hypercoagulable state.  No history of easy bleeding.  No history of anemia  Gastrointestinal: No hematochezia or melena,  No gastroesophageal reflux, no trouble swallowing  Urinary: [ ] chronic Kidney disease, [ ] on  HD - [ ] MWF or [ ] TTHS, [ ] Burning with urination, [ ] Frequent urination, [ ] Difficulty urinating;   Skin: No rashes  Psychological: No history of anxiety,  No history of depression   Physical Examination  Vitals:   11/30/17 1425  BP: 140/71  Pulse: 77  Resp: 16  Temp: 99.3   F (37.4 C)  TempSrc: Oral  SpO2: 96%  Weight: 212 lb (96.2 kg)  Height: 5' 10" (1.778 m)    Body mass index is 30.42 kg/m.  General:  Alert and oriented, no acute distress HEENT: Normal Neck: No bruit or JVD Pulmonary: Clear to auscultation bilaterally Cardiac: Regular Rate and Rhythm without murmur Abdomen: Soft, non-tender, non-distended, no mass Skin: No rash Extremity Pulses:  2+ radial, brachial, femoral, absent right dorsalis pedis, posterior tibial pulse 1+ left dorsalis pedis absent left posterior tibial pulse Musculoskeletal: No deformity or edema  Neurologic: Upper and lower extremity motor 5/5 and symmetric  DATA:  She had bilateral ABIs performed on October 19, 2017.  Right side was 0.59 left 0.78 right side was monophasic left side biphasic  ASSESSMENT: Bilateral lower extremity claudication right greater than left.  I discussed with the patient today that currently he is not at risk of limb loss.  However, he wishes to consider an intervention for lifestyle limiting claudication.  I also discussed with him today smoking cessation and discussed with him that any durability would be significantly lowered if he did not quit smoking.  I also discussed with him today that he is at high risk of limb loss down the road with a combination of diabetes combined with smoking.  He is going to try to quit with Chantix.  Procedure details risks benefits and possible complications of arteriogram and possible intervention angioplasty or stenting were discussed with the patient today.  He understands and agrees to proceed.  Risks discussed were contrast reaction vessel injury bleeding  infection.   PLAN: Aortogram with bilateral lower extremity runoff possible intervention tomorrow.   Avonelle Viveros, MD [facility] Office: [phone] Pager: [phone]   

## 2017-11-30 NOTE — H&P (View-Only) (Signed)
Referring Physician: Dr Evelena Peat  Patient name: Jason [name] MRN: [id number] DOB: 11-Mar-1966 Sex: male  REASON FOR CONSULT: Bilateral lower extremity claudication HPI: Jason [name] is a 52 y.o. male, a several month history of pain in both calves when walking.  This usually occurs at about 100 yards.  He has been slowed down at work secondary to this.  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  He has to stop during his job several times due to his calf pain.  He denies any rest pain in his feet or nonhealing wounds.  However he does have approximately 1 year history of a burning and cold sensation in his feet consistent with peripheral neuropathy.  He was placed on Neurontin recently by Dr. Caryl Never and has had some improvement in the symptoms.  He is currently on Pletal but still has not really experienced any improvement in his walking distance.  Several miles every day but has to stop multiple times.  Currently smokes greater than 2 packs of cigarettes per day.  Greater than 3 minutes today spent regarding smoking cessation counseling.  He is on aspirin daily.  He also has a history of diabetes.  He is on Lipitor and aspirin.  Other medical problems include anxiety depression hyperlipidemia hypertension all of which are currently stable.  Past Medical History:  Diagnosis Date  . Anxiety   . Depression   . GERD (gastroesophageal reflux disease)   . Headache(784.0)   . Hyperlipidemia   . Hypertension   . Osteoarthritis    Past Surgical History:  Procedure Laterality Date  . ROTATOR CUFF REPAIR      Family History  Problem Relation Age of Onset  . Heart attack Father 53  . Hypertension Father   . Arthritis Mother   . Asthma Maternal Grandfather   . Stroke Paternal Grandfather   . Heart disease Other   . Mental illness Other   . Cancer Neg Hx   . Diabetes Neg Hx   . Drug abuse Neg Hx   . Early death Neg Hx   . Hearing loss Neg Hx   .  Hyperlipidemia Neg Hx     SOCIAL HISTORY: Social History   Socioeconomic History  . Marital status: Married    Spouse name: Not on file  . Number of children: Not on file  . Years of education: Not on file  . Highest education level: Not on file  Occupational History  . Not on file  Social Needs  . Financial resource strain: Not on file  . Food insecurity:    Worry: Not on file    Inability: Not on file  . Transportation needs:    Medical: Not on file    Non-medical: Not on file  Tobacco Use  . Smoking status: Current Every Day Smoker    Packs/day: 2.00    Years: 25.00    Pack years: 50.00    Types: Cigarettes  . Smokeless tobacco: Never Used  Substance and Sexual Activity  . Alcohol use: Yes    Alcohol/week: 12.0 oz    Types: 20 Glasses of wine per week  . Drug use: No  . Sexual activity: Not Currently  Lifestyle  . Physical activity:    Days per week: Not on file    Minutes per session: Not on file  . Stress: Not on file  Relationships  . Social connections:    Talks on phone: Not  on file    Gets together: Not on file    Attends religious service: Not on file    Active member of club or organization: Not on file    Attends meetings of clubs or organizations: Not on file    Relationship status: Not on file  . Intimate partner violence:    Fear of current or ex partner: Not on file    Emotionally abused: Not on file    Physically abused: Not on file    Forced sexual activity: Not on file  Other Topics Concern  . Not on file  Social History Narrative  . Not on file    Allergies  Allergen Reactions  . Olmesartan Medoxomil     REACTION: dizziness    Current Outpatient Medications  Medication Sig Dispense Refill  . atorvastatin (LIPITOR) 20 MG tablet TAKE 1 TABLET BY MOUTH EVERY DAY 90 tablet 1  . Blood Glucose Monitoring Suppl (CONTOUR BLOOD GLUCOSE SYSTEM) DEVI Use once daily for diabetes Dx E11.9 1 Device 0  . celecoxib (CELEBREX) 100 MG capsule  Take 1 capsule (100 mg total) by mouth 2 (two) times daily as needed. 180 capsule 0  . cilostazol (PLETAL) 100 MG tablet Take 1 tablet (100 mg total) by mouth 2 (two) times daily. 60 tablet 5  . doxycycline (VIBRAMYCIN) 100 MG capsule Take 1 capsule (100 mg total) by mouth 2 (two) times daily. 20 capsule 0  . DULoxetine (CYMBALTA) 30 MG capsule Take 1 capsule (30 mg total) by mouth daily. 30 capsule 3  . gabapentin (NEURONTIN) 300 MG capsule Take 3 po in AM and 3 po QHS. 540 capsule 1  . glucose blood (BAYER CONTOUR TEST) test strip Test glucose once daily.  Dx E 11.9 100 each 3  . Lancets MISC Contour Lancets.  Test glucose once daily. DxE11.9 100 each 3  . metFORMIN (GLUCOPHAGE) 500 MG tablet Take two tablets twice daily. 360 tablet 3  . traZODone (DESYREL) 50 MG tablet Take 1 tablet by mouth at bedtime as needed for insomnia 30 tablet 3   No current facility-administered medications for this visit.     ROS:   General:  No weight loss, Fever, chills  HEENT: No recent headaches, no nasal bleeding, no visual changes, no sore throat  Neurologic: No dizziness, blackouts, seizures. No recent symptoms of stroke or mini- stroke. No recent episodes of slurred speech, or temporary blindness.  Cardiac: No recent episodes of chest pain/pressure, no shortness of breath at rest.  No shortness of breath with exertion.  Denies history of atrial fibrillation or irregular heartbeat  Vascular: No history of rest pain in feet.  + history of claudication.  No history of non-healing ulcer, No history of DVT   Pulmonary: No home oxygen, no productive cough, no hemoptysis,  No asthma or wheezing  Musculoskeletal:  [X]  Arth ritis, [ ]  Low back pain,  [X]  Joint pain  Hematologic:No history of hypercoagulable state.  No history of easy bleeding.  No history of anemia  Gastrointestinal: No hematochezia or melena,  No gastroesophageal reflux, no trouble swallowing  Urinary: [ ]  chronic Kidney disease, [ ]  on  HD - [ ]  MWF or [ ]  TTHS, [ ]  Burning with urination, [ ]  Frequent urination, [ ]  Difficulty urinating;   Skin: No rashes  Psychological: No history of anxiety,  No history of depression   Physical Examination  Vitals:   11/30/17 1425  BP: 140/71  Pulse: 77  Resp: 16  Temp: 99.3  F (37.4 C)  TempSrc: Oral  SpO2: 96%  Weight: 212 lb (96.2 kg)  Height: 5\' 10"  (1.778 m)    Body mass index is 30.42 kg/m.  General:  Alert and oriented, no acute distress HEENT: Normal Neck: No bruit or JVD Pulmonary: Clear to auscultation bilaterally Cardiac: Regular Rate and Rhythm without murmur Abdomen: Soft, non-tender, non-distended, no mass Skin: No rash Extremity Pulses:  2+ radial, brachial, femoral, absent right dorsalis pedis, posterior tibial pulse 1+ left dorsalis pedis absent left posterior tibial pulse Musculoskeletal: No deformity or edema  Neurologic: Upper and lower extremity motor 5/5 and symmetric  DATA:  She had bilateral ABIs performed on October 19, 2017.  Right side was 0.59 left 0.78 right side was monophasic left side biphasic  ASSESSMENT: Bilateral lower extremity claudication right greater than left.  I discussed with the patient today that currently he is not at risk of limb loss.  However, he wishes to consider an intervention for lifestyle limiting claudication.  I also discussed with him today smoking cessation and discussed with him that any durability would be significantly lowered if he did not quit smoking.  I also discussed with him today that he is at high risk of limb loss down the road with a combination of diabetes combined with smoking.  He is going to try to quit with Chantix.  Procedure details risks benefits and possible complications of arteriogram and possible intervention angioplasty or stenting were discussed with the patient today.  He understands and agrees to proceed.  Risks discussed were contrast reaction vessel injury bleeding  infection.   PLAN: Aortogram with bilateral lower extremity runoff possible intervention tomorrow.   Fabienne Bruns, MD [facility] Office: [phone] Pager: [phone]

## 2017-11-30 NOTE — Progress Notes (Signed)
  Referring Physician: Dr Bruce Burchette  Patient name: Jason [name] MRN: [id number] DOB: 05/28/1966 Sex: male  REASON FOR CONSULT: Bilateral lower extremity claudication HPI: Jason [name] is a 51 y.o. male, a several month history of pain in both calves when walking.  This usually occurs at about 100 yards.  He has been slowed down at work secondary to this.  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  He has to stop during his job several times due to his calf pain.  He denies any rest pain in his feet or nonhealing wounds.  However he does have approximately 1 year history of a burning and cold sensation in his feet consistent with peripheral neuropathy.  He was placed on Neurontin recently by Dr. Burchette and has had some improvement in the symptoms.  He is currently on Pletal but still has not really experienced any improvement in his walking distance.  Several miles every day but has to stop multiple times.  Currently smokes greater than 2 packs of cigarettes per day.  Greater than 3 minutes today spent regarding smoking cessation counseling.  He is on aspirin daily.  He also has a history of diabetes.  He is on Lipitor and aspirin.  Other medical problems include anxiety depression hyperlipidemia hypertension all of which are currently stable.  Past Medical History:  Diagnosis Date  . Anxiety   . Depression   . GERD (gastroesophageal reflux disease)   . Headache(784.0)   . Hyperlipidemia   . Hypertension   . Osteoarthritis    Past Surgical History:  Procedure Laterality Date  . ROTATOR CUFF REPAIR      Family History  Problem Relation Age of Onset  . Heart attack Father 48  . Hypertension Father   . Arthritis Mother   . Asthma Maternal Grandfather   . Stroke Paternal Grandfather   . Heart disease Other   . Mental illness Other   . Cancer Neg Hx   . Diabetes Neg Hx   . Drug abuse Neg Hx   . Early death Neg Hx   . Hearing loss Neg Hx   .  Hyperlipidemia Neg Hx     SOCIAL HISTORY: Social History   Socioeconomic History  . Marital status: Married    Spouse name: Not on file  . Number of children: Not on file  . Years of education: Not on file  . Highest education level: Not on file  Occupational History  . Not on file  Social Needs  . Financial resource strain: Not on file  . Food insecurity:    Worry: Not on file    Inability: Not on file  . Transportation needs:    Medical: Not on file    Non-medical: Not on file  Tobacco Use  . Smoking status: Current Every Day Smoker    Packs/day: 2.00    Years: 25.00    Pack years: 50.00    Types: Cigarettes  . Smokeless tobacco: Never Used  Substance and Sexual Activity  . Alcohol use: Yes    Alcohol/week: 12.0 oz    Types: 20 Glasses of wine per week  . Drug use: No  . Sexual activity: Not Currently  Lifestyle  . Physical activity:    Days per week: Not on file    Minutes per session: Not on file  . Stress: Not on file  Relationships  . Social connections:    Talks on phone: Not   on file    Gets together: Not on file    Attends religious service: Not on file    Active member of club or organization: Not on file    Attends meetings of clubs or organizations: Not on file    Relationship status: Not on file  . Intimate partner violence:    Fear of current or ex partner: Not on file    Emotionally abused: Not on file    Physically abused: Not on file    Forced sexual activity: Not on file  Other Topics Concern  . Not on file  Social History Narrative  . Not on file    Allergies  Allergen Reactions  . Olmesartan Medoxomil     REACTION: dizziness    Current Outpatient Medications  Medication Sig Dispense Refill  . atorvastatin (LIPITOR) 20 MG tablet TAKE 1 TABLET BY MOUTH EVERY DAY 90 tablet 1  . Blood Glucose Monitoring Suppl (CONTOUR BLOOD GLUCOSE SYSTEM) DEVI Use once daily for diabetes Dx E11.9 1 Device 0  . celecoxib (CELEBREX) 100 MG capsule  Take 1 capsule (100 mg total) by mouth 2 (two) times daily as needed. 180 capsule 0  . cilostazol (PLETAL) 100 MG tablet Take 1 tablet (100 mg total) by mouth 2 (two) times daily. 60 tablet 5  . doxycycline (VIBRAMYCIN) 100 MG capsule Take 1 capsule (100 mg total) by mouth 2 (two) times daily. 20 capsule 0  . DULoxetine (CYMBALTA) 30 MG capsule Take 1 capsule (30 mg total) by mouth daily. 30 capsule 3  . gabapentin (NEURONTIN) 300 MG capsule Take 3 po in AM and 3 po QHS. 540 capsule 1  . glucose blood (BAYER CONTOUR TEST) test strip Test glucose once daily.  Dx E 11.9 100 each 3  . Lancets MISC Contour Lancets.  Test glucose once daily. DxE11.9 100 each 3  . metFORMIN (GLUCOPHAGE) 500 MG tablet Take two tablets twice daily. 360 tablet 3  . traZODone (DESYREL) 50 MG tablet Take 1 tablet by mouth at bedtime as needed for insomnia 30 tablet 3   No current facility-administered medications for this visit.     ROS:   General:  No weight loss, Fever, chills  HEENT: No recent headaches, no nasal bleeding, no visual changes, no sore throat  Neurologic: No dizziness, blackouts, seizures. No recent symptoms of stroke or mini- stroke. No recent episodes of slurred speech, or temporary blindness.  Cardiac: No recent episodes of chest pain/pressure, no shortness of breath at rest.  No shortness of breath with exertion.  Denies history of atrial fibrillation or irregular heartbeat  Vascular: No history of rest pain in feet.  + history of claudication.  No history of non-healing ulcer, No history of DVT   Pulmonary: No home oxygen, no productive cough, no hemoptysis,  No asthma or wheezing  Musculoskeletal:  [X] Arthritis, [ ] Low back pain,  [X] Joint pain  Hematologic:No history of hypercoagulable state.  No history of easy bleeding.  No history of anemia  Gastrointestinal: No hematochezia or melena,  No gastroesophageal reflux, no trouble swallowing  Urinary: [ ] chronic Kidney disease, [ ] on  HD - [ ] MWF or [ ] TTHS, [ ] Burning with urination, [ ] Frequent urination, [ ] Difficulty urinating;   Skin: No rashes  Psychological: No history of anxiety,  No history of depression   Physical Examination  Vitals:   11/30/17 1425  BP: 140/71  Pulse: 77  Resp: 16  Temp: 99.3   F (37.4 C)  TempSrc: Oral  SpO2: 96%  Weight: 212 lb (96.2 kg)  Height: 5' 10" (1.778 m)    Body mass index is 30.42 kg/m.  General:  Alert and oriented, no acute distress HEENT: Normal Neck: No bruit or JVD Pulmonary: Clear to auscultation bilaterally Cardiac: Regular Rate and Rhythm without murmur Abdomen: Soft, non-tender, non-distended, no mass Skin: No rash Extremity Pulses:  2+ radial, brachial, femoral, absent right dorsalis pedis, posterior tibial pulse 1+ left dorsalis pedis absent left posterior tibial pulse Musculoskeletal: No deformity or edema  Neurologic: Upper and lower extremity motor 5/5 and symmetric  DATA:  She had bilateral ABIs performed on October 19, 2017.  Right side was 0.59 left 0.78 right side was monophasic left side biphasic  ASSESSMENT: Bilateral lower extremity claudication right greater than left.  I discussed with the patient today that currently he is not at risk of limb loss.  However, he wishes to consider an intervention for lifestyle limiting claudication.  I also discussed with him today smoking cessation and discussed with him that any durability would be significantly lowered if he did not quit smoking.  I also discussed with him today that he is at high risk of limb loss down the road with a combination of diabetes combined with smoking.  He is going to try to quit with Chantix.  Procedure details risks benefits and possible complications of arteriogram and possible intervention angioplasty or stenting were discussed with the patient today.  He understands and agrees to proceed.  Risks discussed were contrast reaction vessel injury bleeding  infection.   PLAN: Aortogram with bilateral lower extremity runoff possible intervention tomorrow.   Charles Fields, MD [facility] Office: [phone] Pager: [phone]   

## 2017-12-01 ENCOUNTER — Encounter (HOSPITAL_COMMUNITY)
Admission: RE | Disposition: A | Discharge status: Home / Self Care | Payer: Self-pay | Source: Ambulatory Visit | Attending: Vascular Surgery

## 2017-12-01 ENCOUNTER — Other Ambulatory Visit: Payer: Self-pay

## 2017-12-01 ENCOUNTER — Ambulatory Visit (HOSPITAL_COMMUNITY)
Admission: RE | Admit: 2017-12-01 | Discharge: 2017-12-01 | Disposition: A | Discharge status: Home / Self Care | Payer: BLUE CROSS/BLUE SHIELD | Source: Ambulatory Visit | Attending: Vascular Surgery | Admitting: Vascular Surgery

## 2017-12-01 ENCOUNTER — Telehealth: Payer: Self-pay | Admitting: Vascular Surgery

## 2017-12-01 DIAGNOSIS — F17210 Nicotine dependence, cigarettes, uncomplicated: Secondary | ICD-10-CM | POA: Insufficient documentation

## 2017-12-01 DIAGNOSIS — Z7982 Long term (current) use of aspirin: Secondary | ICD-10-CM | POA: Insufficient documentation

## 2017-12-01 DIAGNOSIS — Z8249 Family history of ischemic heart disease and other diseases of the circulatory system: Secondary | ICD-10-CM | POA: Diagnosis not present

## 2017-12-01 DIAGNOSIS — Z7984 Long term (current) use of oral hypoglycemic drugs: Secondary | ICD-10-CM | POA: Diagnosis not present

## 2017-12-01 DIAGNOSIS — E1151 Type 2 diabetes mellitus with diabetic peripheral angiopathy without gangrene: Secondary | ICD-10-CM | POA: Insufficient documentation

## 2017-12-01 DIAGNOSIS — I70213 Atherosclerosis of native arteries of extremities with intermittent claudication, bilateral legs: Secondary | ICD-10-CM | POA: Diagnosis not present

## 2017-12-01 DIAGNOSIS — E785 Hyperlipidemia, unspecified: Secondary | ICD-10-CM | POA: Insufficient documentation

## 2017-12-01 DIAGNOSIS — F329 Major depressive disorder, single episode, unspecified: Secondary | ICD-10-CM | POA: Diagnosis not present

## 2017-12-01 DIAGNOSIS — K219 Gastro-esophageal reflux disease without esophagitis: Secondary | ICD-10-CM | POA: Diagnosis not present

## 2017-12-01 DIAGNOSIS — I10 Essential (primary) hypertension: Secondary | ICD-10-CM | POA: Diagnosis not present

## 2017-12-01 DIAGNOSIS — F419 Anxiety disorder, unspecified: Secondary | ICD-10-CM | POA: Diagnosis not present

## 2017-12-01 DIAGNOSIS — M1990 Unspecified osteoarthritis, unspecified site: Secondary | ICD-10-CM | POA: Diagnosis not present

## 2017-12-01 HISTORY — PX: PERIPHERAL VASCULAR BALLOON ANGIOPLASTY: CATH118281

## 2017-12-01 HISTORY — PX: ABDOMINAL AORTOGRAM W/LOWER EXTREMITY: CATH118223

## 2017-12-01 HISTORY — PX: PERIPHERAL VASCULAR INTERVENTION: CATH118257

## 2017-12-01 LAB — POCT ACTIVATED CLOTTING TIME
Activated Clotting Time: 197 seconds
Activated Clotting Time: 164 seconds

## 2017-12-01 LAB — POCT I-STAT, CHEM 8
BUN: 18 mg/dL (ref 6–20)
CHLORIDE: 106 mmol/L (ref 101–111)
CREATININE: 0.6 mg/dL — AB (ref 0.61–1.24)
Calcium, Ion: 1.11 mmol/L — ABNORMAL LOW (ref 1.15–1.40)
GLUCOSE: 209 mg/dL — AB (ref 65–99)
HCT: 46 % (ref 39.0–52.0)
Hemoglobin: 15.6 g/dL (ref 13.0–17.0)
POTASSIUM: 4.4 mmol/L (ref 3.5–5.1)
Sodium: 140 mmol/L (ref 135–145)
TCO2: 24 mmol/L (ref 22–32)

## 2017-12-01 LAB — GLUCOSE, CAPILLARY
GLUCOSE-CAPILLARY: 103 mg/dL — AB (ref 65–99)
Glucose-Capillary: 129 mg/dL — ABNORMAL HIGH (ref 65–99)

## 2017-12-01 SURGERY — ABDOMINAL AORTOGRAM W/LOWER EXTREMITY
Anesthesia: LOCAL | Laterality: Right

## 2017-12-01 MED ORDER — SODIUM CHLORIDE 0.9 % IV SOLN: INTRAVENOUS | Status: AC

## 2017-12-01 MED ORDER — ONDANSETRON HCL 4 MG/2ML IJ SOLN: 4.0000 mg | Freq: Four times a day (QID) | INTRAMUSCULAR | Status: DC | PRN

## 2017-12-01 MED ORDER — MIDAZOLAM HCL 2 MG/2ML IJ SOLN
INTRAMUSCULAR | Status: DC | PRN
  Administered 2017-12-01: 1 mg via INTRAVENOUS

## 2017-12-01 MED ORDER — IODIXANOL 320 MG/ML IV SOLN
INTRAVENOUS | Status: DC | PRN
  Administered 2017-12-01: 200 mL via INTRA_ARTERIAL

## 2017-12-01 MED ORDER — MIDAZOLAM HCL 2 MG/2ML IJ SOLN
INTRAMUSCULAR | Status: AC
  Filled 2017-12-01: qty 2

## 2017-12-01 MED ORDER — HEPARIN (PORCINE) IN NACL 2-0.9 UNIT/ML-% IJ SOLN
INTRAMUSCULAR | Status: AC | PRN
  Administered 2017-12-01: 1000 mL via INTRA_ARTERIAL

## 2017-12-01 MED ORDER — FENTANYL CITRATE (PF) 100 MCG/2ML IJ SOLN
INTRAMUSCULAR | Status: DC | PRN
  Administered 2017-12-01: 25 ug via INTRAVENOUS

## 2017-12-01 MED ORDER — SODIUM CHLORIDE 0.9% FLUSH: 3.0000 mL | INTRAVENOUS | Status: DC | PRN

## 2017-12-01 MED ORDER — HYDRALAZINE HCL 20 MG/ML IJ SOLN: 5.0000 mg | INTRAMUSCULAR | Status: DC | PRN

## 2017-12-01 MED ORDER — ACETAMINOPHEN 325 MG PO TABS: 650.0000 mg | ORAL_TABLET | ORAL | Status: DC | PRN

## 2017-12-01 MED ORDER — HEPARIN SODIUM (PORCINE) 1000 UNIT/ML IJ SOLN
INTRAMUSCULAR | Status: DC | PRN
  Administered 2017-12-01: 10000 [IU] via INTRAVENOUS
  Administered 2017-12-01: 3000 [IU] via INTRAVENOUS

## 2017-12-01 MED ORDER — SODIUM CHLORIDE 0.9 % IV SOLN: 250.0000 mL | INTRAVENOUS | Status: DC | PRN

## 2017-12-01 MED ORDER — MORPHINE SULFATE (PF) 10 MG/ML IV SOLN: 2.0000 mg | INTRAVENOUS | Status: DC | PRN

## 2017-12-01 MED ORDER — OXYCODONE HCL 5 MG PO TABS: 5.0000 mg | ORAL_TABLET | ORAL | Status: DC | PRN

## 2017-12-01 MED ORDER — HEPARIN SODIUM (PORCINE) 1000 UNIT/ML IJ SOLN
INTRAMUSCULAR | Status: AC
  Filled 2017-12-01: qty 1

## 2017-12-01 MED ORDER — HEPARIN (PORCINE) IN NACL 2-0.9 UNIT/ML-% IJ SOLN
INTRAMUSCULAR | Status: AC
  Filled 2017-12-01: qty 1000

## 2017-12-01 MED ORDER — ASPIRIN EC 325 MG PO TBEC: 325.0000 mg | DELAYED_RELEASE_TABLET | Freq: Every day | ORAL | Status: DC

## 2017-12-01 MED ORDER — LIDOCAINE HCL 1 % IJ SOLN
INTRAMUSCULAR | Status: AC
  Filled 2017-12-01: qty 20

## 2017-12-01 MED ORDER — SODIUM CHLORIDE 0.9 % IV SOLN
INTRAVENOUS | Status: DC
  Administered 2017-12-01: 07:00:00 via INTRAVENOUS

## 2017-12-01 MED ORDER — SODIUM CHLORIDE 0.9% FLUSH: 3.0000 mL | Freq: Two times a day (BID) | INTRAVENOUS | Status: DC

## 2017-12-01 MED ORDER — ASPIRIN 325 MG PO TABS
ORAL_TABLET | ORAL | Status: AC
  Filled 2017-12-01: qty 1

## 2017-12-01 MED ORDER — LIDOCAINE HCL (PF) 1 % IJ SOLN
INTRAMUSCULAR | Status: DC | PRN
  Administered 2017-12-01: 15 mL

## 2017-12-01 MED ORDER — LABETALOL HCL 5 MG/ML IV SOLN: 10.0000 mg | INTRAVENOUS | Status: DC | PRN

## 2017-12-01 MED ORDER — FENTANYL CITRATE (PF) 100 MCG/2ML IJ SOLN
INTRAMUSCULAR | Status: AC
  Filled 2017-12-01: qty 2

## 2017-12-01 SURGICAL SUPPLY — 19 items
BALLN MUSTANG 8X60X75 (BALLOONS) ×4
BALLOON MUSTANG 8X60X75 (BALLOONS) IMPLANT
CATH ANGIO 5F PIGTAIL 65CM (CATHETERS) ×1 IMPLANT
CATH QUICKCROSS .035X135CM (MICROCATHETER) ×2 IMPLANT
CATH STRAIGHT 5FR 65CM (CATHETERS) ×1 IMPLANT
CATH TEMPO 5F RIM 65CM (CATHETERS) ×1 IMPLANT
DEVICE CONTINUOUS FLUSH (MISCELLANEOUS) ×1 IMPLANT
GLIDEWIRE ADV .035X260CM (WIRE) ×1 IMPLANT
KIT ENCORE 26 ADVANTAGE (KITS) ×1 IMPLANT
KIT PV (KITS) ×4 IMPLANT
SHEATH AVANTI 11CM 5FR (SHEATH) ×1 IMPLANT
SHEATH PINNACLE ST 7F 45CM (SHEATH) ×1 IMPLANT
STENT EPIC VASCULAR 9X60X75 (Permanent Stent) ×1 IMPLANT
SYR MEDRAD MARK V 150ML (SYRINGE) ×4 IMPLANT
TRANSDUCER W/STOPCOCK (MISCELLANEOUS) ×4 IMPLANT
TRAY PV CATH (CUSTOM PROCEDURE TRAY) ×4 IMPLANT
WIRE BENTSON .035X145CM (WIRE) ×1 IMPLANT
WIRE ROSEN-J .035X180CM (WIRE) ×1 IMPLANT
WIRE ROSEN-J .035X260CM (WIRE) ×1 IMPLANT

## 2017-12-01 NOTE — Progress Notes (Addendum)
Site area: LFA Site Prior to Removal:  Level 0 Pressure Applied For:25 min Manual:  yes  Patient Status During Pull:  stable Post Pull Site:  Level Post Pull Instructions Given:  yes Post Pull Pulses Present: doppler Dressing Applied:  clear Bedrest begins @ 1315 till 1715 Comments:

## 2017-12-01 NOTE — Interval H&P Note (Signed)
History and Physical Interval Note:  12/01/2017 8:29 AM  Jason [name]  has presented today for surgery, with the diagnosis of pad with ulcer  The various methods of treatment have been discussed with the patient and family. After consideration of risks, benefits and other options for treatment, the patient has consented to  Procedure(s): ABDOMINAL AORTOGRAM W/LOWER EXTREMITY (N/A) as a surgical intervention .  The patient's history has been reviewed, patient examined, no change in status, stable for surgery.  I have reviewed the patient's chart and labs.  Questions were answered to the patient's satisfaction.     Fabienne Brunsharles [place] L[place]r

## 2017-12-01 NOTE — Telephone Encounter (Signed)
Sched cardiac appt 12/05/17 ar 8:40 with Dr. Josiah Loboevenkar at [facility]. Lm on hm# with appt, address, and ph# of [facility].

## 2017-12-01 NOTE — Op Note (Signed)
Procedure: Abdominal aortogram with bilateral lower extremity runoff, attempted right superficial femoral artery intervention, left external iliac artery stent (9 x 60 self-expanding)  Preoperative diagnosis: Bilateral lower extremity claudication  Postoperative diagnosis: Same  Anesthesia: Local with IV sedation  Operative findings: #1 right lower extremity occlusion right superficial femoral artery with reconstitution of the above-knee popliteal artery three-vessel runoff  2.  70% stenosis left external iliac artery stented to 0% residual stenosis (9 x 60 self-expanding)  3.  Three-vessel runoff left lower extremity  Operative details: After obtaining informed consent, the patient taken the PV lab.  The patient was placed in supine position Angio table.  Both groins were prepped and draped in usual sterile fashion.  Local anesthesia was infiltrated of the left common femoral artery.  Ultrasound was used to identify the left common femoral artery and femoral bifurcation.  An introducer needle was then used to cannulate the left common femoral artery and an 035 Bentson wire advanced into the abdominal aorta under fluoroscopic guidance.  A 5 French sheath was then placed over the guidewire in the left common femoral artery.  This was thoroughly flushed with heparinized saline.  A 5 French pigtail catheter was then advanced over the guidewire in the abdominal aorta and abdominal aortogram was obtained in AP projection.  The left and right renal arteries are widely patent.  The infrarenal abdominal aorta is widely patent.  The left and right common iliac arteries are patent.  Next the pigtail catheter was pulled down just above the aortic bifurcation and bilateral oblique views of the pelvis were obtained.  The patient had bilateral patent internal iliac arteries which were small but with no significant narrowing at the origin.  The left external iliac artery has a 70% stenosis in its midportion.  This  extends over several centimeters.  The right external iliac artery is widely patent.  Next bilateral lower extremity runoff views were obtained through the rectal catheter.  In the left lower extremity, the left common femoral profunda femoris and superficial femoral arteries are widely patent.  The left popliteal artery is patent and there is three-vessel runoff to the left foot.  In the right lower extremity, the right common femoral and profunda femoris is patent.  The right superficial femoral artery occludes about 2 cm after its origin.  This is a very diffuse lesion which is ill-defined.  The above-knee popliteal artery does reconstitute via profunda collaterals.  There is three-vessel runoff to the right foot.  At this point I decided to try to intervene on the right superficial femoral artery occlusion.  The patient was given 10,000 units of intravenous heparin.  He was given an additional 3000 units of heparin after an initial ACT was only 215.  At this point the pico catheter was removed over a guidewire and exchanged for the 035 crossover catheter.  This was used to selectively catheterize the right common iliac followed by the distal right external iliac.  An 35 Rosen wire was then advanced through this and the 5 [place] sheath and the crossover catheter were removed.  A 7 French destination sheath was then advanced up and over the aortic bifurcation and into the distal right external iliac artery.  I then used an 035 Glidewire advantage and a 035 quick cross to try to cross the lesion.  I was able to advance to the midportion of the thigh but contrast angiogram showed that we were extraluminal at this point.  There appeared to be 2 areas  of potential perforation.  I pulled the quick cross and Glidewire out and performed another angiogram from the external iliac artery.  This showed no obvious extravasation.  The profunda and common femoral were still intact.  However, I did not feel any further  attempts to cross the lesion would be beneficial since the area had perforated so easily.  This was a very long lesion and I felt that the patient would be better served with a bypass rather than continued attempts at an intervention.  At this point the Glidewire advantage was removed and exchanged for an [employer]035 Rosen.  This was pulled back as well as the sheath to allow the Rosen wire to go up into the abdominal aorta.  We then marked the external iliac artery lesion on the left side.  A 9 x 60 self-expanding stent was selected.  This was advanced and centered on the lesion and deployed.  It was then postdilated with an 8 mm balloon to nominal pressure for 30 seconds.  Completion angiogram showed good wall apposition of the stent and a widely patent left external iliac artery.  The sheath was left in place to be pulled in the holding area.  The patient tolerated procedure well and there were no complications.  Operative management: The patient has in-line flow to the left lower extremity at this point.  We will schedule him for cardiac risk stratification and a right femoral to above-knee popliteal bypass in the near future for his right leg.  Fabienne Brunsharles Eva Vallee, MD [facility] Office: [phone] Pager: [phone]

## 2017-12-01 NOTE — Discharge Instructions (Signed)
° °  [facility] ° °Discharge Instructions ° °Lower Extremity Angiogram; Angioplasty/Stenting ° °Please refer to the following instructions for your post-procedure care. Your surgeon or physician assistant will discuss any changes with you. ° °Activity ° °Avoid lifting more than 8 pounds (1 gallons of milk) for 72 hours (3 days) after your procedure. You may walk as much as you can tolerate. It's OK to drive after 72 hours. ° °Bathing/Showering ° °You may shower the day after your procedure. If you have a bandage, you may remove it at 24- 48 hours. Clean your incision site with mild soap and water. Pat the area dry with a clean towel. ° °Diet ° °Resume your pre-procedure diet. There are no special food restrictions following this procedure. All patients with peripheral vascular disease should follow a low fat/low cholesterol diet. In order to heal from your surgery, it is CRITICAL to get adequate nutrition. Your body requires vitamins, minerals, and protein. Vegetables are the best source of vitamins and minerals. Vegetables also provide the perfect balance of protein. Processed food has little nutritional value, so try to avoid this. ° °Medications ° °Resume taking all of your medications unless your doctor tells you not to. If your incision is causing pain, you may take over-the-counter pain relievers such as acetaminophen (Tylenol) ° °Follow Up ° °Follow up will be arranged at the time of your procedure. You may have an office visit scheduled or may be scheduled for surgery. Ask your surgeon if you have any questions. ° °Please call us immediately for any of the following conditions: °•Severe or worsening pain your legs or feet at rest or with walking. °•Increased pain, redness, drainage at your groin puncture site. °•Fever of 101 degrees or higher. °•If you have any mild or slow bleeding from your puncture site: lie down, apply firm constant pressure over the area with a piece of  gauze or a clean wash cloth for 30 minutes- no peeking!, call 911 right away if you are still bleeding after 30 minutes, or if the bleeding is heavy and unmanageable. ° °Reduce your risk factors of vascular disease: ° °Stop smoking. If you would like help call [facility] at 1-800-QUIT-NOW (1-800-784-8669) or [facility] at [phone]. °Manage your cholesterol °Maintain a desired weight °Control your diabetes °Keep your blood pressure down ° °If you have any questions, please call the office at [phone] ° °

## 2017-12-01 NOTE — Telephone Encounter (Signed)
-----   Message from Sharee PimpleMarilyn K McChesney, RN sent at 12/01/2017  2:22 PM EDT ----- Regarding: ASAP cardiac clearance for bypass on 12-11-17   ----- Message ----- From: Sharee PimpleMcChesney, Marilyn K, RN Sent: 12/01/2017  10:48 AM To: Deno Lungeregina J Seymour Subject: charge                                           ----- Message ----- From: Sherren KernsFields, Charles E, MD Sent: 12/01/2017  10:25 AM To: [facility] Charge Pool  Procedure: Abdominal aortogram with bilateral lower extremity runoff, attempted right superficial femoral artery intervention, left external iliac artery stent (9 x 60 self-expanding)   We need to schedule him for cardiac risk stratification and a right femoral to above-knee popliteal bypass in the near future for his right leg.  [name], MD [facility] Office: [phone] Pager: [phone]

## 2017-12-04 ENCOUNTER — Encounter (HOSPITAL_COMMUNITY): Payer: Self-pay | Admitting: Vascular Surgery

## 2017-12-04 LAB — POCT ACTIVATED CLOTTING TIME
ACTIVATED CLOTTING TIME: 213 s
ACTIVATED CLOTTING TIME: 241 s

## 2017-12-04 MED FILL — Heparin Sodium (Porcine) 2 Unit/ML in Sodium Chloride 0.9%: INTRAMUSCULAR | Qty: 1000 | Status: AC

## 2017-12-04 MED FILL — Lidocaine HCl Local Inj 1%: INTRAMUSCULAR | Qty: 20 | Status: AC

## 2017-12-05 ENCOUNTER — Ambulatory Visit: Payer: BLUE CROSS/BLUE SHIELD | Admitting: Cardiology

## 2017-12-05 ENCOUNTER — Encounter: Payer: Self-pay | Admitting: Cardiology

## 2017-12-05 ENCOUNTER — Other Ambulatory Visit: Payer: Self-pay | Admitting: *Deleted

## 2017-12-05 VITALS — BP 150/72 | HR 88 | Ht 70.0 in | Wt 218.0 lb

## 2017-12-05 DIAGNOSIS — G4730 Sleep apnea, unspecified: Secondary | ICD-10-CM

## 2017-12-05 DIAGNOSIS — E782 Mixed hyperlipidemia: Secondary | ICD-10-CM | POA: Diagnosis not present

## 2017-12-05 DIAGNOSIS — F17200 Nicotine dependence, unspecified, uncomplicated: Secondary | ICD-10-CM

## 2017-12-05 DIAGNOSIS — E119 Type 2 diabetes mellitus without complications: Secondary | ICD-10-CM

## 2017-12-05 DIAGNOSIS — I10 Essential (primary) hypertension: Secondary | ICD-10-CM

## 2017-12-05 DIAGNOSIS — I739 Peripheral vascular disease, unspecified: Secondary | ICD-10-CM | POA: Diagnosis not present

## 2017-12-05 DIAGNOSIS — Z01810 Encounter for preprocedural cardiovascular examination: Secondary | ICD-10-CM

## 2017-12-05 MED ORDER — METOPROLOL SUCCINATE ER 25 MG PO TB24: 25.0000 mg | ORAL_TABLET | Freq: Every day | ORAL | 2 refills | Status: DC

## 2017-12-05 MED ORDER — ASPIRIN EC 325 MG PO TBEC: 325.0000 mg | DELAYED_RELEASE_TABLET | Freq: Every day | ORAL | 0 refills | Status: DC

## 2017-12-05 NOTE — Progress Notes (Signed)
Cardiology Office Note:    Date:  12/05/2017   ID:  Jason [name], DOB May 31, 1966, MRN [id number]  PCP:  [name], MD  Cardiologist:  Garwin Brothers, MD   Referring MD: [name], MD    ASSESSMENT:    1. Preoperative cardiovascular examination   2. Essential hypertension, benign   3. Peripheral arterial disease (HCC)   4. Sleep apnea, unspecified type   5. Diabetes mellitus without complication (HCC)   6. Mixed hyperlipidemia   7. TOBACCO USE   8. Intermittent claudication (HCC)    PLAN:    In order of problems listed above:  1. Secondary prevention stressed to the patient.  Importance of compliance with diet and medications stressed and he vocalized understanding.  He needs to be on full-strength coated aspirin and I told him to check this with his surgeon as to when he needs to begin it.  I do not want this to interfere with the surgery.  He can take at least 81 mg of coated aspirin on a daily basis if his surgeon is okay with this.  Diet was discussed for dyslipidemia, diabetes mellitus.  He is on statin therapy. 2. I spent 5 minutes with the patient discussing solely about smoking. Smoking cessation was counseled. I suggested to the patient also different medications and pharmacological interventions. Patient is keen to try stopping on its own at this time. He will get back to me if he needs any further assistance in this matter. 3. I reviewed stress test report extensively.  It is a very risk study.  In view of this I told him that he is at moderate risk for the aforementioned vascular surgery.  Meticulous hemodynamic monitoring and continued perioperative beta-blockade will further reduce the risk of coronary events.  I have initiated him on metoprolol succinate 25 mg daily.  I think it would help if he can take this on the morning of the surgery if okayed by anesthesia and his surgeon. 4. He will be seen in follow-up appointment in the month of August or  earlier if he has any concerns.   Medication Adjustments/Labs and Tests Ordered: Current medicines are reviewed at length with the patient today.  Concerns regarding medicines are outlined above.  No orders of the defined types were placed in this encounter.  Meds ordered this encounter  Medications  . metoprolol succinate (TOPROL-XL) 25 MG 24 hr tablet    Sig: Take 1 tablet (25 mg total) by mouth daily.    Dispense:  90 tablet    Refill:  2  . aspirin EC 325 MG tablet    Sig: Take 1 tablet (325 mg total) by mouth daily.    Dispense:  30 tablet    Refill:  0     History of Present Illness:    Jason [name] is a 52 y.o. male who is being seen today for the evaluation of preop cardiovascular evaluation at the request of Burchette, Elberta Fortis, MD.  Patient is a pleasant 52 year old male.  He has past medical history of diabetes mellitus, dyslipidemia, extensive tobacco abuse with smoking and intermittent claudication.  He mentions to me that his circulation on the left lower extremity underwent stenting in the right knee surgery.  For this reason he underwent stress testing.  He denies any chest pain orthopnea or PND.  I asked him this question of multiple locations is limiting factor is his lower extremity claudication.  No syncope no dizziness.  At the time of my evaluation, the patient is alert awake oriented and in no distress.  Past Medical History:  Diagnosis Date  . Anxiety   . Depression   . GERD (gastroesophageal reflux disease)   . Headache(784.0)   . Hyperlipidemia   . Hypertension   . Osteoarthritis     Past Surgical History:  Procedure Laterality Date  . ABDOMINAL AORTOGRAM W/LOWER EXTREMITY N/A 12/01/2017   Procedure: ABDOMINAL AORTOGRAM W/LOWER EXTREMITY;  Surgeon: Sherren KernsFields, Charles E, MD;  Location: [facility] IN[facility];  Service: Cardiovascular;  Laterality: N/A;  . PERIPHERAL VASCULAR BALLOON ANGIOPLASTY Right 12/01/2017   Procedure: PERIPHERAL VASCULAR BALLOON  ANGIOPLASTY;  Surgeon: Sherren KernsFields, Charles E, MD;  Location: [facility] INVAS[facility] CV LAB;  Service: Cardiovascular;  Laterality: Right;  superficial femoral, attempted  . PERIPHERAL VASCULAR INTERVENTION Left 12/01/2017   Procedure: PERIPHERAL VASCULAR INTERVENTION;  Surgeon: Sherren KernsFields, Charles E, MD;  [facility];  Service: Cardiovascular;  Laterality: Left;  External iliac  . ROTATOR CUFF REPAIR      Current Medications: Current Meds  Medication Sig  . atorvastatin (LIPITOR) 20 MG tablet TAKE 1 TABLET BY MOUTH EVERY DAY  . Blood Glucose Monitoring Suppl (CONTOUR BLOOD GLUCOSE SYSTEM) DEVI Use once daily for diabetes Dx E11.9  . celecoxib (CELEBREX) 100 MG capsule Take 1 capsule (100 mg total) by mouth 2 (two) times daily as needed. (Patient taking differently: Take 100 mg by mouth 2 (two) times daily as needed for mild pain. )  . cilostazol (PLETAL) 100 MG tablet Take 1 tablet (100 mg total) by mouth 2 (two) times daily.  Marland Kitchen. doxycycline (VIBRAMYCIN) 100 MG capsule Take 1 capsule (100 mg total) by mouth 2 (two) times daily.  . DULoxetine (CYMBALTA) 30 MG capsule Take 1 capsule (30 mg total) by mouth daily.  Marland Kitchen. gabapentin (NEURONTIN) 300 MG capsule Take 3 po in AM and 3 po QHS. (Patient taking differently: Take 900 mg by mouth 2 (two) times daily. Take 3 po in AM and 3 po QHS.)  . glucose blood (BAYER CONTOUR TEST) test strip Test glucose once daily.  Dx E 11.9  . Lancets MISC Contour Lancets.  Test glucose once daily. DxE11.9  . metFORMIN (GLUCOPHAGE) 500 MG tablet Take two tablets twice daily. (Patient taking differently: Take 500 mg by mouth 2 (two) times daily with a meal. Take two tablets twice daily.)  . traZODone (DESYREL) 50 MG tablet Take 1 tablet by mouth at bedtime as needed for insomnia     Allergies:   Olmesartan medoxomil   Social History   Socioeconomic History  . Marital status: Married    Spouse name: Not on file  . Number of children: Not on file  . Years of education:  Not on file  . Highest education level: Not on file  Occupational History  . Not on file  Social Needs  . Financial resource strain: Not on file  . Food insecurity:    Worry: Not on file    Inability: Not on file  . Transportation needs:    Medical: Not on file    Non-medical: Not on file  Tobacco Use  . Smoking status: Current Every Day Smoker    Packs/day: 2.00    Years: 25.00    Pack years: 50.00    Types: Cigarettes  . Smokeless tobacco: Never Used  Substance and Sexual Activity  . Alcohol use: Yes    Alcohol/week: 12.0 oz    Types: 20 Glasses of wine per  week  . Drug use: No  . Sexual activity: Not Currently  Lifestyle  . Physical activity:    Days per week: Not on file    Minutes per session: Not on file  . Stress: Not on file  Relationships  . Social connections:    Talks on phone: Not on file    Gets together: Not on file    Attends religious service: Not on file    Active member of club or organization: Not on file    Attends meetings of clubs or organizations: Not on file    Relationship status: Not on file  Other Topics Concern  . Not on file  Social History Narrative  . Not on file     Family History: The patient's family history includes Arthritis in his mother; Asthma in his maternal grandfather; Heart attack (age of onset: 41) in his father; Heart disease in his other; Hypertension in his father; Mental illness in his other; Stroke in his paternal grandfather. There is no history of Cancer, Diabetes, Drug abuse, Early death, Hearing loss, or Hyperlipidemia.  ROS:   Please see the history of present illness.    All other systems reviewed and are negative.  EKGs/Labs/Other Studies Reviewed:    The following studies were reviewed today: I reviewed stress test report and EKG with the patient at extensive length.   Recent Labs: 12/09/2016: TSH 1.15 05/02/2017: ALT 20 12/01/2017: BUN 18; Creatinine, Ser 0.60; Hemoglobin 15.6; Potassium 4.4; Sodium 140    Recent Lipid Panel    Component Value Date/Time   CHOL 176 12/09/2016 1609   TRIG 183 (H) 12/09/2016 1609   HDL 31 (L) 12/09/2016 1609   CHOLHDL 5.7 (H) 12/09/2016 1609   VLDL 37 (H) 12/09/2016 1609   LDLCALC 108 (H) 12/09/2016 1609   LDLDIRECT 138.0 08/30/2016 1721    Physical Exam:    VS:  BP (!) 150/72 (BP Location: Left Arm, Patient Position: Sitting, Cuff Size: Normal)   Pulse 88   Ht 5\' 10"  (1.778 m)   Wt 218 lb (98.9 kg)   SpO2 97%   BMI 31.28 kg/m     Wt Readings from Last 3 Encounters:  12/05/17 218 lb (98.9 kg)  12/01/17 212 lb (96.2 kg)  11/30/17 212 lb (96.2 kg)     GEN: Patient is in no acute distress HEENT: Normal NECK: No JVD; No carotid bruits LYMPHATICS: No lymphadenopathy CARDIAC: S1 S2 regular, 2/6 systolic murmur at the apex. RESPIRATORY:  Clear to auscultation without rales, wheezing or rhonchi  ABDOMEN: Soft, non-tender, non-distended MUSCULOSKELETAL:  No edema; No deformity  SKIN: Warm and dry NEUROLOGIC:  Alert and oriented x 3 PSYCHIATRIC:  Normal affect    Signed, Garwin Brothers, MD  12/05/2017 9:34 AM    [facility]

## 2017-12-05 NOTE — Pre-Procedure Instructions (Signed)
Jason [name]  12/05/2017      [id number] [address] [place] [place] [address] Phone: [phone] Fax: [phone]    Your procedure is scheduled on December 11, 2017 .  Report to [facility] Admitting at 530 AM.  Call this number if you have problems the morning of surgery:  [phone]   Remember:  Do not eat food or drink liquids after midnight.  Take these medicines the morning of surgery with A SIP OF WATER metoprolol succinate (toprol-XL), gabapentin (neurontin), duloxetine (cymbalta), doxycycline (vibramycin)  Follow your doctor's instructions regarding cilostazol (pletal) and aspirin   Beginning now, STOP taking any celebrex (celecoxib),  Aleve, Naproxen, Ibuprofen, Motrin, Advil, Goody's, BC's, all herbal medications, fish oil, and all vitamins  Continue all other medications as instructed by your physician except follow the above medication instructions before surgery  WHAT DO I DO ABOUT MY DIABETES MEDICATION?  Marland Kitchen Do not take oral diabetes medicines (pills) the morning of surgery  metformin (glucophage).   . The day of surgery, do not take other diabetes injectables, including Byetta (exenatide), Bydureon (exenatide ER), Victoza (liraglutide), or Trulicity (dulaglutide).  . If your CBG is greater than 220 mg/dL, you may take  of your sliding scale (correction) dose of insulin.  Reviewed and Endorsed by [facility] Patient Education Committee, August 2015  How to Manage Your Diabetes Before and After Surgery  Why is it important to control my blood sugar before and after surgery? . Improving blood sugar levels before and after surgery helps healing and can limit problems. . A way of improving blood sugar control is eating a healthy diet by: o  Eating less sugar and carbohydrates o  Increasing activity/exercise o  Talking with your doctor about reaching your blood sugar goals . High blood sugars  (greater than 180 mg/dL) can raise your risk of infections and slow your recovery, so you will need to focus on controlling your diabetes during the weeks before surgery. . Make sure that the doctor who takes care of your diabetes knows about your planned surgery including the date and location.  How do I manage my blood sugar before surgery? . Check your blood sugar at least 4 times a day, starting 2 days before surgery, to make sure that the level is not too high or low. o Check your blood sugar the morning of your surgery when you wake up and every 2 hours until you get to the Short Stay unit. . If your blood sugar is less than 70 mg/dL, you will need to treat for low blood sugar: o Do not take insulin. o Treat a low blood sugar (less than 70 mg/dL) with  cup of clear juice (cranberry or apple), 4 glucose tablets, OR glucose gel. Recheck blood sugar in 15 minutes after treatment (to make sure it is greater than 70 mg/dL). If your blood sugar is not greater than 70 mg/dL on recheck, call [phone] o  for further instructions. . Report your blood sugar to the short stay nurse when you get to Short Stay.  . If you are admitted to the hospital after surgery: o Your blood sugar will be checked by the staff and you will probably be given insulin after surgery (instead of oral diabetes medicines) to make sure you have good blood sugar levels. o The goal for blood sugar control after surgery is 80-180 mg/dL.     Do not wear jewelry,  make-up or nail polish.  Do not wear lotions, powders, or perfumes, or deodorant.  Do not shave 48 hours prior to surgery.  Men may shave face and neck.  Do not bring valuables to the hospital.  [facility] is not responsible for any belongings or valuables.  Contacts, dentures or bridgework may not be worn into surgery.  Leave your suitcase in the car.  After surgery it may be brought to your room.  For patients admitted to the hospital, discharge time will be  determined by your treatment team.  Patients discharged the day of surgery will not be allowed to drive home.   [place]- Preparing For Surgery  Before surgery, you can play an important role. Because skin is not sterile, your skin needs to be as free of germs as possible. You can reduce the number of germs on your skin by washing with CHG (chlorahexidine gluconate) Soap before surgery.  CHG is an antiseptic cleaner which kills germs and bonds with the skin to continue killing germs even after washing.  Please do not use if you have an allergy to CHG or antibacterial soaps. If your skin becomes reddened/irritated stop using the CHG.  Do not shave (including legs and underarms) for at least 48 hours prior to first CHG shower. It is OK to shave your face.  Please follow these instructions carefully.   1. Shower the NIGHT BEFORE SURGERY and the MORNING OF SURGERY with CHG.   2. If you chose to wash your hair, wash your hair first as usual with your normal shampoo.  3. After you shampoo, rinse your hair and body thoroughly to remove the shampoo.  4. Use CHG as you would any other liquid soap. You can apply CHG directly to the skin and wash gently with a scrungie or a clean washcloth.   5. Apply the CHG Soap to your body ONLY FROM THE NECK DOWN.  Do not use on open wounds or open sores. Avoid contact with your eyes, ears, mouth and genitals (private parts). Wash Face and genitals (private parts)  with your normal soap.  6. Wash thoroughly, paying special attention to the area where your surgery will be performed.  7. Thoroughly rinse your body with warm water from the neck down.  8. DO NOT shower/wash with your normal soap after using and rinsing off the CHG Soap.  9. Pat yourself dry with a CLEAN TOWEL.  10. Wear CLEAN PAJAMAS to bed the night before surgery, wear comfortable clothes the morning of surgery  11. Place CLEAN SHEETS on your bed the night of your first shower and DO NOT  SLEEP WITH PETS.   Day of Surgery: Do not apply any deodorants/lotions. Please wear clean clothes to the hospital/surgery center.    Please read over the following fact sheets that you were given. Pain Booklet, Coughing and Deep Breathing, MRSA Information and Surgical Site Infection Prevention

## 2017-12-05 NOTE — Pre-Procedure Instructions (Signed)
Jason [name]  12/05/2017      [id number] [address] [place] [address] Phone: [phone] Fax: [phone]    Your procedure is scheduled on Monday, December 11, 2017  Report to [facility] Admitting at 5:30 A.M.  Call this number if you have problems the morning of surgery:  [phone]   Remember:  Do not eat food or drink liquids after midnight Sunday, December 10, 2017  Take these medicines the morning of surgery with A SIP OF WATER : aspirin, cilostazol (PLETAL), gabapentin (NEURONTIN), metoprolol succinate (TOPROL-XL)  Stop taking vitamins, fish oil and herbal medications. Do not take any NSAIDs ie: Ibuprofen, Advil, Naproxen (Aleve), Motrin, celecoxib (CELEBREX), BC and Goody Powder; stop now.   How to Manage Your Diabetes Before and After Surgery  Why is it important to control my blood sugar before and after surgery? . Improving blood sugar levels before and after surgery helps healing and can limit problems. . A way of improving blood sugar control is eating a healthy diet by: o  Eating less sugar and carbohydrates o  Increasing activity/exercise o  Talking with your doctor about reaching your blood sugar goals . High blood sugars (greater than 180 mg/dL) can raise your risk of infections and slow your recovery, so you will need to focus on controlling your diabetes during the weeks before surgery. . Make sure that the doctor who takes care of your diabetes knows about your planned surgery including the date and location.  How do I manage my blood sugar before surgery? . Check your blood sugar at least 4 times a day, starting 2 days before surgery, to make sure that the level is not too high or low. o Check your blood sugar the morning of your surgery when you wake up and every 2 hours until you get to the Short Stay unit. . If your blood sugar is less than 70 mg/dL, you will need to treat for low blood  sugar: o Do not take insulin. o Treat a low blood sugar (less than 70 mg/dL) with  cup of clear juice (cranberry or apple), 4 glucose tablets, OR glucose gel. Recheck blood sugar in 15 minutes after treatment (to make sure it is greater than 70 mg/dL). If your blood sugar is not greater than 70 mg/dL on recheck, call [phone] o  for further instructions. . Report your blood sugar to the short stay nurse when you get to Short Stay.  . If you are admitted to the hospital after surgery: o Your blood sugar will be checked by the staff and you will probably be given insulin after surgery (instead of oral diabetes medicines) to make sure you have good blood sugar levels. o The goal for blood sugar control after surgery is 80-180 mg/dL.  WHAT DO I DO ABOUT MY DIABETES MEDICATION?   Marland Kitchen Do not take oral diabetes medicines (pills) the morning of surgery such as Metformin (Glucophage)  Do not wear jewelry, make-up or nail polish.  Do not wear lotions, powders, or perfumes, or deodorant.  Do not shave 48 hours prior to surgery.  Men may shave face and neck.  Do not bring valuables to the hospital.  [facility] is not responsible for any belongings or valuables.  Contacts, dentures or bridgework may not be worn into surgery.  Leave your suitcase in the car.  After surgery it may be brought to your room. For patients admitted  to the hospital, discharge time will be determined by your treatment team. Special instructions: Read " [place]-Preparing For Surgery " sheet Please read over the following fact sheets that you were given. Pain Booklet, Coughing and Deep Breathing, MRSA Information and Surgical Site Infection Prevention

## 2017-12-05 NOTE — Patient Instructions (Addendum)
Medication Instructions:  Your physician has recommended you make the following change in your medication:  START metoprolol succinate 25 mg daily START enteric coated aspirin 325 mg daily  Labwork: None  Testing/Procedures: None  Follow-Up: Your physician recommends that you schedule a follow-up appointment in: August, 2019  Any Other Special Instructions Will Be Listed Below (If Applicable).     If you need a refill on your cardiac medications before your next appointment, please call your pharmacy.   [facility]  Garey HamAshley A, RN, BSN    Aspirin and Your Heart Aspirin is a medicine that affects the way blood clots. Aspirin can be used to help reduce the risk of blood clots, heart attacks, and other heart-related problems. Should I take aspirin? Your health care provider will help you determine whether it is safe and beneficial for you to take aspirin daily. Taking aspirin daily may be beneficial if you:  Have had a heart attack or chest pain.  Have undergone open heart surgery such as coronary artery bypass surgery (CABG).  Have had coronary angioplasty.  Have experienced a stroke or transient ischemic attack (TIA).  Have peripheral vascular disease (PVD).  Have chronic heart rhythm problems such as atrial fibrillation.  Are there any risks of taking aspirin daily? Daily use of aspirin can increase your risk of side effects. Some of these include:  Bleeding. Bleeding problems can be minor or serious. An example of a minor problem is a cut that does not stop bleeding. An example of a more serious problem is stomach bleeding or bleeding into the brain. Your risk of bleeding is increased if you are also taking non-steroidal anti-inflammatory medicine (NSAIDs).  Increased bruising.  Upset stomach.  An allergic reaction. People who have nasal polyps have an increased risk of developing an aspirin allergy.  What are some guidelines I should follow when taking  aspirin?  Take aspirin only as directed by your health care provider. Make sure you understand how much you should take and what form you should take. The two forms of aspirin are: ? Non-enteric-coated. This type of aspirin does not have a coating and is absorbed quickly. Non-enteric-coated aspirin is usually recommended for people with chest pain. This type of aspirin also comes in a chewable form. ? Enteric-coated. This type of aspirin has a special coating that releases the medicine very slowly. Enteric-coated aspirin causes less stomach upset than non-enteric-coated aspirin. This type of aspirin should not be chewed or crushed.  Drink alcohol in moderation. Drinking alcohol increases your risk of bleeding. When should I seek medical care?  You have unusual bleeding or bruising.  You have stomach pain.  You have an allergic reaction. Symptoms of an allergic reaction include: ? Hives. ? Itchy skin. ? Swelling of the lips, tongue, or face.  You have ringing in your ears. When should I seek immediate medical care?  Your bowel movements are bloody, dark red, or black in color.  You vomit or cough up blood.  You have blood in your urine.  You cough, wheeze, or feel short of breath. If you have any of the following symptoms, this is an emergency. Do not wait to see if the pain will go away. Get medical help at once. Call your local emergency services (911 in the [place] Do not drive yourself to the hospital.  You have severe chest pain, especially if the pain is crushing or pressure-like and spreads to the arms, back, neck, or jaw.  You have  stroke-like symptoms, such as: ? Loss of vision. ? Difficulty talking. ? Numbness or weakness on one side of your body. ? Numbness or weakness in your arm or leg. ? Not thinking clearly or feeling confused.  This information is not intended to replace advice given to you by your health care provider. Make sure you discuss any questions you have  with your health care provider. Document Released: 07/28/2008 Document Revised: 12/23/2015 Document Reviewed: 11/20/2013 Elsevier Interactive Patient Education  2018 Elsevier Inc. Metoprolol extended-release tablets What is this medicine? METOPROLOL (me TOE proe lole) is a beta-blocker. Beta-blockers reduce the workload on the heart and help it to beat more regularly. This medicine is used to treat high blood pressure and to prevent chest pain. It is also used to after a heart attack and to prevent an additional heart attack from occurring. This medicine may be used for other purposes; ask your health care provider or pharmacist if you have questions. COMMON BRAND NAME(S): toprol, Toprol XL What should I tell my health care provider before I take this medicine? They need to know if you have any of these conditions: -diabetes -heart or vessel disease like slow heart rate, worsening heart failure, heart block, sick sinus syndrome or Raynaud's disease -kidney disease -liver disease -lung or breathing disease, like asthma or emphysema -pheochromocytoma -thyroid disease -an unusual or allergic reaction to metoprolol, other beta-blockers, medicines, foods, dyes, or preservatives -pregnant or trying to get pregnant -breast-feeding How should I use this medicine? Take this medicine by mouth with a glass of water. Follow the directions on the prescription label. Do not crush or chew. Take this medicine with or immediately after meals. Take your doses at regular intervals. Do not take more medicine than directed. Do not stop taking this medicine suddenly. This could lead to serious heart-related effects. Talk to your pediatrician regarding the use of this medicine in children. While this drug may be prescribed for children as young as 6 years for selected conditions, precautions do apply. Overdosage: If you think you have taken too much of this medicine contact a poison control center or emergency room  at once. NOTE: This medicine is only for you. Do not share this medicine with others. What if I miss a dose? If you miss a dose, take it as soon as you can. If it is almost time for your next dose, take only that dose. Do not take double or extra doses. What may interact with this medicine? This medicine may interact with the following medications: -certain medicines for blood pressure, heart disease, irregular heart beat -certain medicines for depression, like monoamine oxidase (MAO) inhibitors, fluoxetine, or paroxetine -clonidine -dobutamine -epinephrine -isoproterenol -reserpine This list may not describe all possible interactions. Give your health care provider a list of all the medicines, herbs, non-prescription drugs, or dietary supplements you use. Also tell them if you smoke, drink alcohol, or use illegal drugs. Some items may interact with your medicine. What should I watch for while using this medicine? Visit your doctor or health care professional for regular check ups. Contact your doctor right away if your symptoms worsen. Check your blood pressure and pulse rate regularly. Ask your health care professional what your blood pressure and pulse rate should be, and when you should contact them. You may get drowsy or dizzy. Do not drive, use machinery, or do anything that needs mental alertness until you know how this medicine affects you. Do not sit or stand up quickly, especially if you  are an older patient. This reduces the risk of dizzy or fainting spells. Contact your doctor if these symptoms continue. Alcohol may interfere with the effect of this medicine. Avoid alcoholic drinks. What side effects may I notice from receiving this medicine? Side effects that you should report to your doctor or health care professional as soon as possible: -allergic reactions like skin rash, itching or hives -cold or numb hands or feet -depression -difficulty breathing -faint -fever with sore  throat -irregular heartbeat, chest pain -rapid weight gain -swollen legs or ankles Side effects that usually do not require medical attention (report to your doctor or health care professional if they continue or are bothersome): -anxiety or nervousness -change in sex drive or performance -dry skin -headache -nightmares or trouble sleeping -short term memory loss -stomach upset or diarrhea -unusually tired This list may not describe all possible side effects. Call your doctor for medical advice about side effects. You may report side effects to FDA at 1-800-FDA-1088. Where should I keep my medicine? Keep out of the reach of children. Store at room temperature between 15 and 30 degrees C (59 and 86 degrees F). Throw away any unused medicine after the expiration date. NOTE: This sheet is a summary. It may not cover all possible information. If you have questions about this medicine, talk to your doctor, pharmacist, or health care provider.  2018 Elsevier/Gold Standard (2013-04-19 14:41:37)

## 2017-12-06 ENCOUNTER — Encounter (HOSPITAL_COMMUNITY): Payer: Self-pay

## 2017-12-06 ENCOUNTER — Telehealth: Payer: Self-pay | Admitting: *Deleted

## 2017-12-06 ENCOUNTER — Encounter (HOSPITAL_COMMUNITY)
Admission: RE | Admit: 2017-12-06 | Discharge: 2017-12-06 | Disposition: A | Discharge status: Home / Self Care | Payer: BLUE CROSS/BLUE SHIELD | Source: Ambulatory Visit | Attending: Vascular Surgery | Admitting: Vascular Surgery

## 2017-12-06 ENCOUNTER — Other Ambulatory Visit: Payer: Self-pay

## 2017-12-06 DIAGNOSIS — Z01812 Encounter for preprocedural laboratory examination: Secondary | ICD-10-CM | POA: Insufficient documentation

## 2017-12-06 DIAGNOSIS — F17210 Nicotine dependence, cigarettes, uncomplicated: Secondary | ICD-10-CM | POA: Diagnosis not present

## 2017-12-06 DIAGNOSIS — Z79899 Other long term (current) drug therapy: Secondary | ICD-10-CM | POA: Diagnosis not present

## 2017-12-06 DIAGNOSIS — Z7982 Long term (current) use of aspirin: Secondary | ICD-10-CM | POA: Insufficient documentation

## 2017-12-06 DIAGNOSIS — E119 Type 2 diabetes mellitus without complications: Secondary | ICD-10-CM | POA: Diagnosis not present

## 2017-12-06 DIAGNOSIS — I10 Essential (primary) hypertension: Secondary | ICD-10-CM | POA: Insufficient documentation

## 2017-12-06 DIAGNOSIS — E785 Hyperlipidemia, unspecified: Secondary | ICD-10-CM | POA: Diagnosis not present

## 2017-12-06 DIAGNOSIS — K219 Gastro-esophageal reflux disease without esophagitis: Secondary | ICD-10-CM | POA: Diagnosis not present

## 2017-12-06 HISTORY — DX: Type 2 diabetes mellitus without complications: E11.9

## 2017-12-06 HISTORY — DX: Other injury of unspecified body region, initial encounter: T14.8XXA

## 2017-12-06 HISTORY — DX: Peripheral vascular disease, unspecified: I73.9

## 2017-12-06 HISTORY — DX: Pneumonia, unspecified organism: J18.9

## 2017-12-06 HISTORY — DX: Methicillin resistant Staphylococcus aureus infection, unspecified site: A49.02

## 2017-12-06 LAB — COMPREHENSIVE METABOLIC PANEL
ALK PHOS: 97 U/L (ref 38–126)
ALT: 23 U/L (ref 17–63)
AST: 20 U/L (ref 15–41)
Albumin: 4 g/dL (ref 3.5–5.0)
Anion gap: 11 (ref 5–15)
BILIRUBIN TOTAL: 0.5 mg/dL (ref 0.3–1.2)
BUN: 12 mg/dL (ref 6–20)
CALCIUM: 9.2 mg/dL (ref 8.9–10.3)
CO2: 24 mmol/L (ref 22–32)
CREATININE: 0.79 mg/dL (ref 0.61–1.24)
Chloride: 103 mmol/L (ref 101–111)
Glucose, Bld: 129 mg/dL — ABNORMAL HIGH (ref 65–99)
Potassium: 4.4 mmol/L (ref 3.5–5.1)
Sodium: 138 mmol/L (ref 135–145)
TOTAL PROTEIN: 7.4 g/dL (ref 6.5–8.1)

## 2017-12-06 LAB — URINALYSIS, ROUTINE W REFLEX MICROSCOPIC
Bilirubin Urine: NEGATIVE
GLUCOSE, UA: NEGATIVE mg/dL
Hgb urine dipstick: NEGATIVE
Ketones, ur: NEGATIVE mg/dL
LEUKOCYTES UA: NEGATIVE
Nitrite: NEGATIVE
PROTEIN: NEGATIVE mg/dL
SPECIFIC GRAVITY, URINE: 1.023 (ref 1.005–1.030)
pH: 5 (ref 5.0–8.0)

## 2017-12-06 LAB — TYPE AND SCREEN
ABO/RH(D): A POS
Antibody Screen: NEGATIVE

## 2017-12-06 LAB — APTT: aPTT: 32 seconds (ref 24–36)

## 2017-12-06 LAB — CBC
HEMATOCRIT: 43.9 % (ref 39.0–52.0)
Hemoglobin: 14.8 g/dL (ref 13.0–17.0)
MCH: 29.6 pg (ref 26.0–34.0)
MCHC: 33.7 g/dL (ref 30.0–36.0)
MCV: 87.8 fL (ref 78.0–100.0)
Platelets: 341 10*3/uL (ref 150–400)
RBC: 5 MIL/uL (ref 4.22–5.81)
RDW: 13.2 % (ref 11.5–15.5)
WBC: 15.4 10*3/uL — ABNORMAL HIGH (ref 4.0–10.5)

## 2017-12-06 LAB — SURGICAL PCR SCREEN
MRSA, PCR: NEGATIVE
Staphylococcus aureus: NEGATIVE

## 2017-12-06 LAB — ABO/RH: ABO/RH(D): A POS

## 2017-12-06 LAB — GLUCOSE, CAPILLARY: GLUCOSE-CAPILLARY: 120 mg/dL — AB (ref 65–99)

## 2017-12-06 LAB — PROTIME-INR
INR: 0.96
Prothrombin Time: 12.7 seconds (ref 11.4–15.2)

## 2017-12-06 NOTE — Progress Notes (Signed)
Pt denies any acute cardiopulmonary issues. Pt under the care of Dr. Tomie Chinaevankar, Cardiology. Pt denies having a cardiac cath and echo.Pt denies having a chest x ray within the last year. Pt chart forwarded to anesthesia to review abnormal labs (WBC 15.4) and cardiology note in Epic.

## 2017-12-06 NOTE — Progress Notes (Signed)
   12/06/17 0821  OBSTRUCTIVE SLEEP APNEA  Have you ever been diagnosed with sleep apnea through a sleep study? No  Do you snore loudly (loud enough to be heard through closed doors)?  0  Do you often feel tired, fatigued, or sleepy during the daytime (such as falling asleep during driving or talking to someone)? 0  Has anyone observed you stop breathing during your sleep? 1  Do you have, or are you being treated for high blood pressure? 1  BMI more than 35 kg/m2? 0  Age > 50 (1-yes) 1  Neck circumference greater than:Male 16 inches or larger, Male 17inches or larger? 1  Male Gender (Yes=1) 1  Obstructive Sleep Apnea Score 5

## 2017-12-06 NOTE — Telephone Encounter (Signed)
Copied from CRM [id number]. Topic: Quick Communication - See Telephone Encounter >> Dec 05, 2017 11:01 AM Everardo PacificMoton, Kelly, NT wrote: CRM for notification. See Telephone encounter for: 12/05/17. Patient calling because he would like to speak with Dr.Burchette's nurse. Stated that he has question for her wouldn't say what It was. He can be reached at [phone]   Spoke with patient and scheduled an appointment to review his medications

## 2017-12-06 NOTE — Progress Notes (Signed)
Joyce GrossKay, Surgical Coordinator clarified that pt can take both Aspirin and Pletal on DOS.

## 2017-12-07 NOTE — Progress Notes (Signed)
Anesthesia Chart Review:  Pt is a 52 year old male scheduled for R femoral-above knee popliteal artery bypass graft on 12/11/2017 with [name], MD  - PCP is Evelena PeatBruce Burchette, MD. Last office visit 10/23/17.  Belva Crome- Rajan Revankar, MD. Last office visit 12/05/17  PMH includes:  HTN, DM, hyperlipidemia, GERD. Current smoker. BMI 31.5  Medications include: ASA 325mg , lipitor, pletal, metformin, metoprolol. Pt to continue ASA and pletal perioperatively.   BP (!) 146/74   Pulse 88   Temp 36.7 C   Resp 18   Ht 5\' 10"  (1.778 m)   Wt 219 lb 4.8 oz (99.5 kg)   SpO2 99%   BMI 31.47 kg/m   Preoperative labs reviewed.   - WBC 15.4.  - I spoke with pt by telephone. He does not report any signs/sx of acute illness.  He denies fever, chills, nasal congestion, sore throat, ear pain, dental pain, cough, dysuria, rash. He feels healthy and well.  Will recheck CBC day of surgery.   EKG 12/01/17: NSR  Nuclear stress test 10/21/16:   Nuclear stress EF: 54%.  T wave inversion was noted during stress in the aVF, III and II leads.  The study is normal.  This is a low risk study.  The left ventricular ejection fraction is mildly decreased (45-54%).  If labs acceptable day of surgery, I anticipate pt can proceed with surgery as scheduled.   Rica Mastngela Kabbe, FNP-BC [facility]Anesthesiology Phone: [phone] 12/07/2017 12:20 PM

## 2017-12-11 ENCOUNTER — Inpatient Hospital Stay (HOSPITAL_COMMUNITY): Payer: BLUE CROSS/BLUE SHIELD | Admitting: Emergency Medicine

## 2017-12-11 ENCOUNTER — Encounter (HOSPITAL_COMMUNITY): Payer: Self-pay | Admitting: *Deleted

## 2017-12-11 ENCOUNTER — Encounter (HOSPITAL_COMMUNITY)
Admission: RE | Disposition: A | Discharge status: Home / Self Care | Payer: Self-pay | Source: Ambulatory Visit | Attending: Vascular Surgery

## 2017-12-11 ENCOUNTER — Other Ambulatory Visit: Payer: Self-pay

## 2017-12-11 ENCOUNTER — Inpatient Hospital Stay (HOSPITAL_COMMUNITY)
Admission: RE | Admit: 2017-12-11 | Discharge: 2017-12-12 | DRG: 254 | Disposition: A | Discharge status: Home / Self Care | Payer: BLUE CROSS/BLUE SHIELD | Source: Ambulatory Visit | Attending: Vascular Surgery | Admitting: Vascular Surgery

## 2017-12-11 DIAGNOSIS — E538 Deficiency of other specified B group vitamins: Secondary | ICD-10-CM | POA: Diagnosis not present

## 2017-12-11 DIAGNOSIS — F17210 Nicotine dependence, cigarettes, uncomplicated: Secondary | ICD-10-CM | POA: Diagnosis not present

## 2017-12-11 DIAGNOSIS — F329 Major depressive disorder, single episode, unspecified: Secondary | ICD-10-CM | POA: Diagnosis present

## 2017-12-11 DIAGNOSIS — Z8701 Personal history of pneumonia (recurrent): Secondary | ICD-10-CM | POA: Diagnosis not present

## 2017-12-11 DIAGNOSIS — E1140 Type 2 diabetes mellitus with diabetic neuropathy, unspecified: Secondary | ICD-10-CM | POA: Diagnosis present

## 2017-12-11 DIAGNOSIS — F419 Anxiety disorder, unspecified: Secondary | ICD-10-CM | POA: Diagnosis present

## 2017-12-11 DIAGNOSIS — Z825 Family history of asthma and other chronic lower respiratory diseases: Secondary | ICD-10-CM

## 2017-12-11 DIAGNOSIS — Z8249 Family history of ischemic heart disease and other diseases of the circulatory system: Secondary | ICD-10-CM | POA: Diagnosis not present

## 2017-12-11 DIAGNOSIS — Z7982 Long term (current) use of aspirin: Secondary | ICD-10-CM

## 2017-12-11 DIAGNOSIS — Z7984 Long term (current) use of oral hypoglycemic drugs: Secondary | ICD-10-CM | POA: Diagnosis not present

## 2017-12-11 DIAGNOSIS — G4730 Sleep apnea, unspecified: Secondary | ICD-10-CM | POA: Diagnosis present

## 2017-12-11 DIAGNOSIS — E785 Hyperlipidemia, unspecified: Secondary | ICD-10-CM | POA: Diagnosis present

## 2017-12-11 DIAGNOSIS — E1151 Type 2 diabetes mellitus with diabetic peripheral angiopathy without gangrene: Principal | ICD-10-CM | POA: Diagnosis present

## 2017-12-11 DIAGNOSIS — Z8261 Family history of arthritis: Secondary | ICD-10-CM | POA: Diagnosis not present

## 2017-12-11 DIAGNOSIS — Z716 Tobacco abuse counseling: Secondary | ICD-10-CM | POA: Diagnosis not present

## 2017-12-11 DIAGNOSIS — K219 Gastro-esophageal reflux disease without esophagitis: Secondary | ICD-10-CM | POA: Diagnosis not present

## 2017-12-11 DIAGNOSIS — Z79899 Other long term (current) drug therapy: Secondary | ICD-10-CM

## 2017-12-11 DIAGNOSIS — I739 Peripheral vascular disease, unspecified: Secondary | ICD-10-CM | POA: Diagnosis present

## 2017-12-11 DIAGNOSIS — M170 Bilateral primary osteoarthritis of knee: Secondary | ICD-10-CM | POA: Diagnosis not present

## 2017-12-11 DIAGNOSIS — I10 Essential (primary) hypertension: Secondary | ICD-10-CM | POA: Diagnosis present

## 2017-12-11 DIAGNOSIS — Z8614 Personal history of Methicillin resistant Staphylococcus aureus infection: Secondary | ICD-10-CM

## 2017-12-11 DIAGNOSIS — I70211 Atherosclerosis of native arteries of extremities with intermittent claudication, right leg: Secondary | ICD-10-CM | POA: Diagnosis not present

## 2017-12-11 DIAGNOSIS — Z823 Family history of stroke: Secondary | ICD-10-CM | POA: Diagnosis not present

## 2017-12-11 HISTORY — PX: VEIN HARVEST: SHX6363

## 2017-12-11 HISTORY — PX: FEMORAL-POPLITEAL BYPASS GRAFT: SHX937

## 2017-12-11 LAB — CBC
HCT: 41.2 % (ref 39.0–52.0)
HEMATOCRIT: 40.6 % (ref 39.0–52.0)
Hemoglobin: 13.7 g/dL (ref 13.0–17.0)
Hemoglobin: 13.6 g/dL (ref 13.0–17.0)
MCH: 29.5 pg (ref 26.0–34.0)
MCH: 29.5 pg (ref 26.0–34.0)
MCHC: 33.3 g/dL (ref 30.0–36.0)
MCHC: 33.5 g/dL (ref 30.0–36.0)
MCV: 88.6 fL (ref 78.0–100.0)
MCV: 88.1 fL (ref 78.0–100.0)
PLATELETS: 345 10*3/uL (ref 150–400)
Platelets: 378 10*3/uL (ref 150–400)
RBC: 4.65 MIL/uL (ref 4.22–5.81)
RBC: 4.61 MIL/uL (ref 4.22–5.81)
RDW: 13.5 % (ref 11.5–15.5)
RDW: 13.3 % (ref 11.5–15.5)
WBC: 17 10*3/uL — AB (ref 4.0–10.5)
WBC: 25.1 10*3/uL — AB (ref 4.0–10.5)

## 2017-12-11 LAB — GLUCOSE, CAPILLARY
GLUCOSE-CAPILLARY: 194 mg/dL — AB (ref 65–99)
Glucose-Capillary: 135 mg/dL — ABNORMAL HIGH (ref 65–99)
Glucose-Capillary: 190 mg/dL — ABNORMAL HIGH (ref 65–99)

## 2017-12-11 LAB — CREATININE, SERUM: Creatinine, Ser: 0.87 mg/dL (ref 0.61–1.24)

## 2017-12-11 SURGERY — BYPASS GRAFT FEMORAL-POPLITEAL ARTERY
Anesthesia: General | Site: Leg Upper | Laterality: Right

## 2017-12-11 MED ORDER — MAGNESIUM SULFATE 2 GM/50ML IV SOLN: 2.0000 g | Freq: Every day | INTRAVENOUS | Status: DC | PRN

## 2017-12-11 MED ORDER — ROCURONIUM BROMIDE 10 MG/ML (PF) SYRINGE
PREFILLED_SYRINGE | INTRAVENOUS | Status: AC
  Filled 2017-12-11: qty 20

## 2017-12-11 MED ORDER — SUGAMMADEX SODIUM 200 MG/2ML IV SOLN
INTRAVENOUS | Status: DC | PRN
  Administered 2017-12-11: 198.6 mg via INTRAVENOUS

## 2017-12-11 MED ORDER — NICOTINE 21 MG/24HR TD PT24
21.0000 mg | MEDICATED_PATCH | Freq: Every day | TRANSDERMAL | Status: DC
  Administered 2017-12-11: 21 mg via TRANSDERMAL
  Filled 2017-12-11 (×2): qty 1

## 2017-12-11 MED ORDER — SODIUM CHLORIDE 0.9 % IV SOLN
INTRAVENOUS | Status: DC | PRN
  Administered 2017-12-11: 500 mL

## 2017-12-11 MED ORDER — TRAZODONE HCL 50 MG PO TABS
50.0000 mg | ORAL_TABLET | Freq: Every evening | ORAL | Status: DC | PRN
  Administered 2017-12-11: 50 mg via ORAL
  Filled 2017-12-11: qty 1

## 2017-12-11 MED ORDER — MORPHINE SULFATE (PF) 2 MG/ML IV SOLN: 2.0000 mg | INTRAVENOUS | Status: DC | PRN

## 2017-12-11 MED ORDER — ROCURONIUM BROMIDE 10 MG/ML (PF) SYRINGE
PREFILLED_SYRINGE | INTRAVENOUS | Status: DC | PRN
  Administered 2017-12-11: 20 mg via INTRAVENOUS
  Administered 2017-12-11 (×3): 50 mg via INTRAVENOUS

## 2017-12-11 MED ORDER — 0.9 % SODIUM CHLORIDE (POUR BTL) OPTIME
TOPICAL | Status: DC | PRN
  Administered 2017-12-11: 2000 mL

## 2017-12-11 MED ORDER — ALUM & MAG HYDROXIDE-SIMETH 200-200-20 MG/5ML PO SUSP: 15.0000 mL | ORAL | Status: DC | PRN

## 2017-12-11 MED ORDER — PROPOFOL 10 MG/ML IV BOLUS
INTRAVENOUS | Status: DC | PRN
  Administered 2017-12-11: 200 mg via INTRAVENOUS

## 2017-12-11 MED ORDER — ACETAMINOPHEN 325 MG PO TABS: 325.0000 mg | ORAL_TABLET | ORAL | Status: DC | PRN

## 2017-12-11 MED ORDER — MIDAZOLAM HCL 2 MG/2ML IJ SOLN
INTRAMUSCULAR | Status: AC
  Filled 2017-12-11: qty 2

## 2017-12-11 MED ORDER — PHENOL 1.4 % MT LIQD
1.0000 | OROMUCOSAL | Status: DC | PRN
Start: 2017-12-11 — End: 2017-12-12

## 2017-12-11 MED ORDER — HYDROMORPHONE HCL 2 MG/ML IJ SOLN
0.2500 mg | INTRAMUSCULAR | Status: DC | PRN
Start: 2017-12-11 — End: 2017-12-11
  Administered 2017-12-11 (×4): 0.5 mg via INTRAVENOUS

## 2017-12-11 MED ORDER — GABAPENTIN 300 MG PO CAPS
900.0000 mg | ORAL_CAPSULE | Freq: Two times a day (BID) | ORAL | Status: DC
  Administered 2017-12-11 – 2017-12-12 (×2): 900 mg via ORAL
  Filled 2017-12-11 (×2): qty 3

## 2017-12-11 MED ORDER — MIDAZOLAM HCL 2 MG/2ML IJ SOLN
INTRAMUSCULAR | Status: DC | PRN
  Administered 2017-12-11: 2 mg via INTRAVENOUS

## 2017-12-11 MED ORDER — FENTANYL CITRATE (PF) 250 MCG/5ML IJ SOLN
INTRAMUSCULAR | Status: AC
  Filled 2017-12-11: qty 5

## 2017-12-11 MED ORDER — INSULIN ASPART 100 UNIT/ML ~~LOC~~ SOLN
0.0000 [IU] | Freq: Three times a day (TID) | SUBCUTANEOUS | Status: DC
  Administered 2017-12-12: 2 [IU] via SUBCUTANEOUS

## 2017-12-11 MED ORDER — LACTATED RINGERS IV SOLN
INTRAVENOUS | Status: DC | PRN
  Administered 2017-12-11 (×2): via INTRAVENOUS

## 2017-12-11 MED ORDER — LIDOCAINE 2% (20 MG/ML) 5 ML SYRINGE
INTRAMUSCULAR | Status: DC | PRN
  Administered 2017-12-11: 100 mg via INTRAVENOUS

## 2017-12-11 MED ORDER — LIDOCAINE 2% (20 MG/ML) 5 ML SYRINGE
INTRAMUSCULAR | Status: AC
  Filled 2017-12-11: qty 5

## 2017-12-11 MED ORDER — METOPROLOL TARTRATE 5 MG/5ML IV SOLN: 2.0000 mg | INTRAVENOUS | Status: DC | PRN

## 2017-12-11 MED ORDER — GUAIFENESIN-DM 100-10 MG/5ML PO SYRP: 15.0000 mL | ORAL_SOLUTION | ORAL | Status: DC | PRN

## 2017-12-11 MED ORDER — METOPROLOL SUCCINATE ER 25 MG PO TB24
25.0000 mg | ORAL_TABLET | Freq: Every day | ORAL | Status: DC
  Administered 2017-12-12: 25 mg via ORAL
  Filled 2017-12-11: qty 1

## 2017-12-11 MED ORDER — CHLORHEXIDINE GLUCONATE CLOTH 2 % EX PADS: 6.0000 | MEDICATED_PAD | Freq: Once | CUTANEOUS | Status: DC

## 2017-12-11 MED ORDER — SODIUM CHLORIDE 0.9 % IV SOLN: 500.0000 mL | Freq: Once | INTRAVENOUS | Status: DC | PRN

## 2017-12-11 MED ORDER — HEPARIN SODIUM (PORCINE) 5000 UNIT/ML IJ SOLN: 5000.0000 [IU] | Freq: Three times a day (TID) | INTRAMUSCULAR | Status: DC

## 2017-12-11 MED ORDER — DOCUSATE SODIUM 100 MG PO CAPS
100.0000 mg | ORAL_CAPSULE | Freq: Every day | ORAL | Status: DC
  Filled 2017-12-11: qty 1

## 2017-12-11 MED ORDER — SODIUM CHLORIDE 0.9 % IV SOLN: INTRAVENOUS | Status: DC

## 2017-12-11 MED ORDER — PROTAMINE SULFATE 10 MG/ML IV SOLN: INTRAVENOUS | Status: DC | PRN

## 2017-12-11 MED ORDER — CEFAZOLIN SODIUM-DEXTROSE 2-4 GM/100ML-% IV SOLN
2.0000 g | Freq: Three times a day (TID) | INTRAVENOUS | Status: AC
  Administered 2017-12-11 (×2): 2 g via INTRAVENOUS
  Filled 2017-12-11 (×2): qty 100

## 2017-12-11 MED ORDER — PANTOPRAZOLE SODIUM 40 MG PO TBEC
40.0000 mg | DELAYED_RELEASE_TABLET | Freq: Every day | ORAL | Status: DC
  Administered 2017-12-11 – 2017-12-12 (×2): 40 mg via ORAL
  Filled 2017-12-11: qty 1

## 2017-12-11 MED ORDER — ASPIRIN EC 325 MG PO TBEC
325.0000 mg | DELAYED_RELEASE_TABLET | Freq: Every day | ORAL | Status: DC
  Administered 2017-12-12: 325 mg via ORAL
  Filled 2017-12-11: qty 1

## 2017-12-11 MED ORDER — HEPARIN SODIUM (PORCINE) 5000 UNIT/ML IJ SOLN
INTRAMUSCULAR | Status: AC
  Filled 2017-12-11: qty 1.2

## 2017-12-11 MED ORDER — HYDRALAZINE HCL 20 MG/ML IJ SOLN: 5.0000 mg | INTRAMUSCULAR | Status: DC | PRN

## 2017-12-11 MED ORDER — ROCURONIUM BROMIDE 10 MG/ML (PF) SYRINGE
PREFILLED_SYRINGE | INTRAVENOUS | Status: AC
  Filled 2017-12-11: qty 5

## 2017-12-11 MED ORDER — HEPARIN SODIUM (PORCINE) 1000 UNIT/ML IJ SOLN
INTRAMUSCULAR | Status: AC
  Filled 2017-12-11: qty 1

## 2017-12-11 MED ORDER — CELECOXIB 100 MG PO CAPS
100.0000 mg | ORAL_CAPSULE | Freq: Two times a day (BID) | ORAL | Status: DC | PRN
Start: 2017-12-11 — End: 2017-12-12
  Filled 2017-12-11: qty 1

## 2017-12-11 MED ORDER — PROPOFOL 10 MG/ML IV BOLUS
INTRAVENOUS | Status: AC
  Filled 2017-12-11: qty 20

## 2017-12-11 MED ORDER — HEPARIN SODIUM (PORCINE) 1000 UNIT/ML IJ SOLN
INTRAMUSCULAR | Status: DC | PRN
  Administered 2017-12-11: 5000 [IU] via INTRAVENOUS
  Administered 2017-12-11: 10000 [IU] via INTRAVENOUS

## 2017-12-11 MED ORDER — POLYETHYLENE GLYCOL 3350 17 G PO PACK: 17.0000 g | PACK | Freq: Every day | ORAL | Status: DC | PRN

## 2017-12-11 MED ORDER — FENTANYL CITRATE (PF) 250 MCG/5ML IJ SOLN
INTRAMUSCULAR | Status: DC | PRN
  Administered 2017-12-11: 50 ug via INTRAVENOUS
  Administered 2017-12-11: 100 ug via INTRAVENOUS
  Administered 2017-12-11 (×2): 50 ug via INTRAVENOUS
  Administered 2017-12-11 (×3): 100 ug via INTRAVENOUS
  Administered 2017-12-11: 50 ug via INTRAVENOUS

## 2017-12-11 MED ORDER — BISACODYL 10 MG RE SUPP: 10.0000 mg | Freq: Every day | RECTAL | Status: DC | PRN

## 2017-12-11 MED ORDER — DULOXETINE HCL 30 MG PO CPEP
30.0000 mg | ORAL_CAPSULE | Freq: Every day | ORAL | Status: DC
  Administered 2017-12-12: 30 mg via ORAL
  Filled 2017-12-11: qty 1

## 2017-12-11 MED ORDER — EPHEDRINE 5 MG/ML INJ
INTRAVENOUS | Status: AC
  Filled 2017-12-11: qty 10

## 2017-12-11 MED ORDER — METFORMIN HCL 500 MG PO TABS
500.0000 mg | ORAL_TABLET | Freq: Two times a day (BID) | ORAL | Status: DC
  Administered 2017-12-12: 500 mg via ORAL
  Filled 2017-12-11: qty 1

## 2017-12-11 MED ORDER — ACETAMINOPHEN 325 MG RE SUPP: 325.0000 mg | RECTAL | Status: DC | PRN

## 2017-12-11 MED ORDER — DEXAMETHASONE SODIUM PHOSPHATE 10 MG/ML IJ SOLN
INTRAMUSCULAR | Status: AC
  Filled 2017-12-11: qty 1

## 2017-12-11 MED ORDER — ONDANSETRON HCL 4 MG/2ML IJ SOLN
INTRAMUSCULAR | Status: DC | PRN
  Administered 2017-12-11: 4 mg via INTRAVENOUS

## 2017-12-11 MED ORDER — DEXMEDETOMIDINE HCL IN NACL 200 MCG/50ML IV SOLN
INTRAVENOUS | Status: DC | PRN
  Administered 2017-12-11: 20 ug via INTRAVENOUS

## 2017-12-11 MED ORDER — LABETALOL HCL 5 MG/ML IV SOLN: 10.0000 mg | INTRAVENOUS | Status: DC | PRN

## 2017-12-11 MED ORDER — CEFAZOLIN SODIUM-DEXTROSE 2-4 GM/100ML-% IV SOLN
2.0000 g | INTRAVENOUS | Status: AC
  Administered 2017-12-11: 2 g via INTRAVENOUS

## 2017-12-11 MED ORDER — SUGAMMADEX SODIUM 200 MG/2ML IV SOLN
INTRAVENOUS | Status: AC
  Filled 2017-12-11: qty 10

## 2017-12-11 MED ORDER — ONDANSETRON HCL 4 MG/2ML IJ SOLN: 4.0000 mg | Freq: Four times a day (QID) | INTRAMUSCULAR | Status: DC | PRN

## 2017-12-11 MED ORDER — OXYCODONE-ACETAMINOPHEN 5-325 MG PO TABS
1.0000 | ORAL_TABLET | ORAL | Status: DC | PRN
  Administered 2017-12-11 – 2017-12-12 (×4): 2 via ORAL
  Filled 2017-12-11 (×4): qty 2

## 2017-12-11 MED ORDER — HYDROMORPHONE HCL 2 MG/ML IJ SOLN
INTRAMUSCULAR | Status: AC
  Administered 2017-12-11: 0.5 mg via INTRAVENOUS
  Filled 2017-12-11: qty 1

## 2017-12-11 MED ORDER — ONDANSETRON HCL 4 MG/2ML IJ SOLN
INTRAMUSCULAR | Status: AC
  Filled 2017-12-11: qty 6

## 2017-12-11 MED ORDER — ATORVASTATIN CALCIUM 20 MG PO TABS
20.0000 mg | ORAL_TABLET | Freq: Every day | ORAL | Status: DC
  Administered 2017-12-11: 20 mg via ORAL
  Filled 2017-12-11: qty 1

## 2017-12-11 MED ORDER — POTASSIUM CHLORIDE CRYS ER 20 MEQ PO TBCR
20.0000 meq | EXTENDED_RELEASE_TABLET | Freq: Every day | ORAL | Status: AC | PRN
  Administered 2017-12-12: 20 meq via ORAL
  Filled 2017-12-11: qty 1

## 2017-12-11 SURGICAL SUPPLY — 58 items
ADH SKN CLS APL DERMABOND .7 (GAUZE/BANDAGES/DRESSINGS) ×4
AGENT HMST SPONGE THK3/8 (HEMOSTASIS)
BANDAGE ESMARK 6X9 LF (GAUZE/BANDAGES/DRESSINGS) IMPLANT
BNDG CMPR 9X6 STRL LF SNTH (GAUZE/BANDAGES/DRESSINGS)
BNDG ESMARK 6X9 LF (GAUZE/BANDAGES/DRESSINGS)
CANISTER SUCT 3000ML PPV (MISCELLANEOUS) ×2 IMPLANT
CANNULA VESSEL 3MM 2 BLNT TIP (CANNULA) ×5 IMPLANT
CLIP VESOCCLUDE MED 24/CT (CLIP) ×2 IMPLANT
CLIP VESOCCLUDE SM WIDE 24/CT (CLIP) ×2 IMPLANT
CUFF TOURNIQUET SINGLE 24IN (TOURNIQUET CUFF) IMPLANT
CUFF TOURNIQUET SINGLE 34IN LL (TOURNIQUET CUFF) IMPLANT
CUFF TOURNIQUET SINGLE 44IN (TOURNIQUET CUFF) IMPLANT
DERMABOND ADVANCED (GAUZE/BANDAGES/DRESSINGS) ×4
DERMABOND ADVANCED .7 DNX12 (GAUZE/BANDAGES/DRESSINGS) IMPLANT
DRAIN HEMOVAC 1/8 X 5 (WOUND CARE) ×1 IMPLANT
DRAIN SNY WOU (WOUND CARE) IMPLANT
DRAPE HALF SHEET 40X57 (DRAPES) ×1 IMPLANT
DRAPE X-RAY CASS 24X20 (DRAPES) IMPLANT
ELECT REM PT RETURN 9FT ADLT (ELECTROSURGICAL) ×2
ELECTRODE REM PT RTRN 9FT ADLT (ELECTROSURGICAL) ×1 IMPLANT
EVACUATOR SILICONE 100CC (DRAIN) ×1 IMPLANT
GAUZE SPONGE 4X4 16PLY XRAY LF (GAUZE/BANDAGES/DRESSINGS) ×1 IMPLANT
GLOVE BIO SURGEON STRL SZ 6.5 (GLOVE) ×6 IMPLANT
GLOVE BIO SURGEON STRL SZ7.5 (GLOVE) ×2 IMPLANT
GLOVE BIOGEL PI IND STRL 6.5 (GLOVE) IMPLANT
GLOVE BIOGEL PI INDICATOR 6.5 (GLOVE) ×6
GLOVE SURG SS PI 6.5 STRL IVOR (GLOVE) ×1 IMPLANT
GOWN STRL NON-REIN LRG LVL3 (GOWN DISPOSABLE) ×1 IMPLANT
GOWN STRL REUS W/ TWL LRG LVL3 (GOWN DISPOSABLE) ×3 IMPLANT
GOWN STRL REUS W/TWL LRG LVL3 (GOWN DISPOSABLE) ×8
HEMOSTAT SPONGE AVITENE ULTRA (HEMOSTASIS) IMPLANT
KIT BASIN OR (CUSTOM PROCEDURE TRAY) ×2 IMPLANT
KIT TURNOVER KIT B (KITS) ×2 IMPLANT
NS IRRIG 1000ML POUR BTL (IV SOLUTION) ×4 IMPLANT
PACK PERIPHERAL VASCULAR (CUSTOM PROCEDURE TRAY) ×2 IMPLANT
PAD ARMBOARD 7.5X6 YLW CONV (MISCELLANEOUS) ×4 IMPLANT
SET COLLECT BLD 21X3/4 12 (NEEDLE) IMPLANT
STOPCOCK 4 WAY LG BORE MALE ST (IV SETS) IMPLANT
SUT ETHILON 3 0 PS 1 (SUTURE) ×1 IMPLANT
SUT PROLENE 5 0 C 1 24 (SUTURE) ×2 IMPLANT
SUT PROLENE 6 0 CC (SUTURE) ×4 IMPLANT
SUT PROLENE 7 0 BV 1 (SUTURE) ×1 IMPLANT
SUT PROLENE 7 0 BV1 MDA (SUTURE) ×1 IMPLANT
SUT SILK 2 0 SH (SUTURE) ×2 IMPLANT
SUT SILK 3 0 (SUTURE) ×4
SUT SILK 3-0 18XBRD TIE 12 (SUTURE) IMPLANT
SUT VIC AB 2-0 CT1 36 (SUTURE) ×1 IMPLANT
SUT VIC AB 2-0 SH 27 (SUTURE) ×2
SUT VIC AB 2-0 SH 27XBRD (SUTURE) ×2 IMPLANT
SUT VIC AB 3-0 SH 27 (SUTURE) ×12
SUT VIC AB 3-0 SH 27X BRD (SUTURE) ×4 IMPLANT
SUT VIC AB 4-0 PS2 27 (SUTURE) ×4 IMPLANT
TAPE UMBILICAL COTTON 1/8X30 (MISCELLANEOUS) ×1 IMPLANT
TOWEL GREEN STERILE (TOWEL DISPOSABLE) ×2 IMPLANT
TRAY FOLEY MTR SLVR 16FR STAT (SET/KITS/TRAYS/PACK) ×2 IMPLANT
TUBING EXTENTION W/L.L. (IV SETS) IMPLANT
UNDERPAD 30X30 (UNDERPADS AND DIAPERS) ×2 IMPLANT
WATER STERILE IRR 1000ML POUR (IV SOLUTION) ×2 IMPLANT

## 2017-12-11 NOTE — Anesthesia Preprocedure Evaluation (Addendum)
Anesthesia Evaluation  Patient identified by MRN, date of birth, ID band Patient awake    Reviewed: Allergy & Precautions, H&P , NPO status , Patient's Chart, lab work & pertinent test results, reviewed documented beta blocker date and time   Airway Mallampati: III  TM Distance: >3 FB Neck ROM: Full    Dental no notable dental hx. (+) Teeth Intact, Dental Advisory Given   Pulmonary sleep apnea , Current Smoker,    Pulmonary exam normal breath sounds clear to auscultation       Cardiovascular hypertension, Pt. on medications and Pt. on home beta blockers + Peripheral Vascular Disease   Rhythm:Regular Rate:Normal     Neuro/Psych  Headaches, Anxiety Depression    GI/Hepatic Neg liver ROS, GERD  Controlled,  Endo/Other  diabetes, Type 2, Oral Hypoglycemic Agents  Renal/GU negative Renal ROS  negative genitourinary   Musculoskeletal  (+) Arthritis , Osteoarthritis,    Abdominal   Peds  Hematology negative hematology ROS (+)   Anesthesia Other Findings   Reproductive/Obstetrics negative OB ROS                            Anesthesia Physical Anesthesia Plan  ASA: III  Anesthesia Plan: General   Post-op Pain Management:    Induction: Intravenous  PONV Risk Score and Plan: 2 and Ondansetron and Midazolam  Airway Management Planned: Oral ETT  Additional Equipment:   Intra-op Plan:   Post-operative Plan: Extubation in OR  Informed Consent: I have reviewed the patients History and Physical, chart, labs and discussed the procedure including the risks, benefits and alternatives for the proposed anesthesia with the patient or authorized representative who has indicated his/her understanding and acceptance.   Dental advisory given  Plan Discussed with: CRNA  Anesthesia Plan Comments:         Anesthesia Quick Evaluation

## 2017-12-11 NOTE — Anesthesia Procedure Notes (Signed)
Procedure Name: Intubation Date/Time: 12/11/2017 7:39 AM Performed by: White, Amedeo Plenty, CRNA Pre-anesthesia Checklist: Patient identified, Emergency Drugs available, Suction available and Patient being monitored Patient Re-evaluated:Patient Re-evaluated prior to induction Oxygen Delivery Method: Circle System Utilized Preoxygenation: Pre-oxygenation with 100% oxygen Induction Type: IV induction Ventilation: Mask ventilation without difficulty and Oral airway inserted - appropriate to patient size Laryngoscope Size: Mac and 4 Grade View: Grade III Tube type: Oral Tube size: 7.5 mm Number of attempts: 1 Airway Equipment and Method: Stylet Placement Confirmation: ETT inserted through vocal cords under direct vision,  positive ETCO2 and breath sounds checked- equal and bilateral Secured at: 22 cm Tube secured with: Tape Dental Injury: Teeth and Oropharynx as per pre-operative assessment

## 2017-12-11 NOTE — Anesthesia Postprocedure Evaluation (Signed)
Anesthesia Post Note  Patient: Jason [name]  Procedure(s) Performed: BYPASS GRAFT RIGHT FEMORAL-ABOVE KNEE POPLITEAL ARTERY with Non Reversed Greater Saphenous Vein and Angioplasty distal vein Graft. (Right Leg Upper) RIGHT GREATER SAPHENOUS VEIN HARVEST (Right Leg Upper)     Patient location during evaluation: PACU Anesthesia Type: General Level of consciousness: awake and alert Pain management: pain level controlled Vital Signs Assessment: post-procedure vital signs reviewed and stable Respiratory status: spontaneous breathing, nonlabored ventilation, respiratory function stable and patient connected to nasal cannula oxygen Cardiovascular status: blood pressure returned to baseline and stable Postop Assessment: no apparent nausea or vomiting Anesthetic complications: no    Last Vitals:  Vitals:   12/11/17 1325 12/11/17 1340  BP: 131/65 117/70  Pulse: 75 81  Resp: 12 (!) 23  Temp:    SpO2: 98% 95%    Last Pain:  Vitals:   12/11/17 1325  TempSrc:   PainSc: Asleep                 [name] EDWARD

## 2017-12-11 NOTE — Plan of Care (Signed)
  Problem: Pain Managment: Goal: General experience of comfort will improve Outcome: Progressing   Problem: Safety: Goal: Ability to remain free from injury will improve Outcome: Progressing   Problem: Clinical Measurements: Goal: Postoperative complications will be avoided or minimized Outcome: Progressing   Problem: Clinical Measurements: Goal: Signs and symptoms of graft occlusion will improve Outcome: Progressing

## 2017-12-11 NOTE — Interval H&P Note (Signed)
History and Physical Interval Note:  12/11/2017 7:24 AM  Jason [name]  has presented today for surgery, with the diagnosis of claudication  The various methods of treatment have been discussed with the patient and family. After consideration of risks, benefits and other options for treatment, the patient has consented to  Procedure(s): BYPASS GRAFT FEMORAL-ABOVE KNEE POPLITEAL ARTERY (Right) as a surgical intervention .  The patient's history has been reviewed, patient examined, no change in status, stable for surgery.  I have reviewed the patient's chart and labs.  Questions were answered to the patient's satisfaction.     Jason [name]   

## 2017-12-11 NOTE — Transfer of Care (Signed)
Immediate Anesthesia Transfer of Care Note  Patient: Jason [name]  Procedure(s) Performed: BYPASS GRAFT RIGHT FEMORAL-ABOVE KNEE POPLITEAL ARTERY with Non Reversed Greater Saphenous Vein and Angioplasty distal vein Graft. (Right Leg Upper) RIGHT GREATER SAPHENOUS VEIN HARVEST (Right Leg Upper)  Patient Location: PACU  Anesthesia Type:General  Level of Consciousness: awake, alert  and patient cooperative  Airway & Oxygen Therapy: Patient Spontanous Breathing and Patient connected to nasal cannula oxygen  Post-op Assessment: Report given to RN and Post -op Vital signs reviewed and stable  Post vital signs: Reviewed and stable  Last Vitals:  Vitals Value Taken Time  BP 144/77 12/11/2017 11:55 AM  Temp    Pulse 89 12/11/2017 11:57 AM  Resp 19 12/11/2017 11:57 AM  SpO2 96 % 12/11/2017 11:57 AM  Vitals shown include unvalidated device data.  Last Pain:  Vitals:   12/11/17 0614  TempSrc:   PainSc: 0-No pain      Patients Stated Pain Goal: 3 (12/11/17 [id number])  Complications: No apparent anesthesia complications

## 2017-12-11 NOTE — Op Note (Addendum)
Procedure: Right femoral to above-knee popliteal bypass non reversed greater saphenous, vein patch angioplasty of distal vein graft Preoperative diagnosis: Claudiation  Postoperative diagnosis: Same  Anesthesia: General  Asst.: Doreatha MassedSamantha Rhyne, PA-C  Operative findings: #1 3 mm right greater saphenous vein non reversed femoral to above-knee popliteal bypass  #2 Vein patch of distal vein graft for retained valve 3 cm above distal anastomosis.    Operative details: After obtaining informed consent, the patient was taken to the operating room. The patient was placed in supine position on the operating room table. After induction of general anesthesia and endotracheal intubation, a Foley catheter was placed. Next, the patient's entire right lower extremity was prepped and draped in the usual sterile fashion. A longitudinal incision was then made in the right groin and carried down through the subcutaneous tissues to expose the right common femoral artery. The common femoral artery was dissected free circumferentially. There was a pulse within the common femoral artery.  It was soft in character. The distal external iliac artery was dissected free circumferentially underneath the inguinal ligament. A vessel loop was also placed around the distal external iliac artery. Dissection was then carried out the level of the femoral bifurcation. The superficial femoral and profunda femoris arteries were dissected free circumferentially and vessel loops placed around these.  Next the right greater saphenous vein was harvested through several skip incisions in the medial right leg. The vein was of good quality approximately 3 mm and uniform diameter throughout its course. Side branches were ligated and divided between silk ties or clips.  Next, a longitudinal incision was made on the medial aspect of the right leg above the knee. The greater saphenous vein was also harvested through this incision. Small side branches were  ligated and divided between silk ties or clips. The vein was reflected posteriorly. The incision was deepened down to the level of the fascia. The fascia was opened and the sartorius muscle reflected posteriorly. The above-knee popliteal space was entered. Dissection was carried down to the level of the above-knee popliteal artery this was dissected free circumferentially.Several centimeters of the popliteal artery was dissected free to make a reasonable area for grafting of the above-knee popliteal distal anastomosis. It was also soft in character.  A subsartorial tunnel was created connecting the above-knee incision to the groin. The patient was then given 10000 units of intravenous heparin. The greater saphenous vein was ligated distally and transected. The saphenofemoral junction was then oversewn with a 5-0 Prolene. The vein was gently distended with heparinized saline.  After appropriate circulation time of the heparin, the distal right external iliac artery was controlled with a vessel loop. The profunda femoris and superficial femoral arteries were controlled with Vesseloops. A longitudinal opening was made in the common femoral artery just above the femoral bifurcation. The was then spatulated and placed in a non reversed configuration and sewn end of vein to side of artery using a running 6-0 Prolene suture. A valvulotome was then used to lyse all the valves and there was good pulsatile flow through the graft. Graft was marked for orientation.  The graft was then brought through the subsartorial tunnel down to the above-knee popliteal artery. The above-knee popliteal artery was controlled proximally with a Henley clamp and distally with Henley clamp. A longitudinal opening was made in the distal above-knee popliteal artery in an area that was fairly free of calcification. The graft was then cut to length and spatulated and signed end of graft to side of  artery using running 6-0 Prolene suture. At  completion of the anastomosis everything was forebled backbled and thoroughly flushed. The remainder of the anastomosis was completed and all clamps were removed. The pulse at the anastomosis was not very strong and high pitched doppler flow suggested a retained valve about 3 cm above the anastomosis.  The patient was given 5000 units of heparin.  An additional 3 cm piece of vein was harvested an opened longitudinally.  The graft was clamped at the distal anastomosis and above the retained valve.  The vein was opened longitudinally and the valve lysed under direct vision with scissors. The vein patch was then used to create a patch angioplasty using a running 6 0 prolene suture.  Everything was forebled backbled and thoroughly flushed prior to securing the patch.  The patient had biphasic Doppler flow in the dorsalis pedis and posterior tibial area of the foot. This augmented approximately 75% with unclamping the graft. A 10 flat JP drain was placed in the above knee popliteal space since there were considerable lymphatics. After hemostasis was obtained, the deep layers and subcutaneous layers of the above-knee popliteal incision were closed with running 3-0 Vicryl suture. The skin was closed with a 4-0 Vicryl subcuticular stitch. The groin was inspected and found to be hemostatic. This was then closed in multiple layers of running 2 0 and 3-0 Vicryl suture and 4-0 subcuticular stitch. Dermabond was applied.  The patient tolerated the procedure well and there were no complications. Instrument sponge and needle counts correct in the case. Patient was taken to the recovery in stable condition.   Fabienne Bruns, MD  [facility]  Office: [phone]  Pager: [phone]

## 2017-12-11 NOTE — Progress Notes (Signed)
Pt in PACU, awake  Right foot palpable DP pulse  Stable currently. To 4E when bed available.  Jason Brunsharles Ladainian Therien, MD [facility] Office: [phone] Pager: [phone]

## 2017-12-11 NOTE — Discharge Instructions (Signed)
 [facility]  Discharge instructions  Lower Extremity Bypass Surgery  Please refer to the following instruction for your post-procedure care. Your surgeon or physician assistant will discuss any changes with you.  Activity  You are encouraged to walk as much as you can. You can slowly return to normal activities during the month after your surgery. Avoid strenuous activity and heavy lifting until your doctor tells you it's OK. Avoid activities such as vacuuming or swinging a golf club. Do not drive until your doctor give the OK and you are no longer taking prescription pain medications. It is also normal to have difficulty with sleep habits, eating and bowel movement after surgery. These will go away with time.  Bathing/Showering  Shower daily after you go home. Do not soak in a bathtub, hot tub, or swim until the incision heals completely.  Incision Care  Clean your incision with mild soap and water. Shower every day. Pat the area dry with a clean towel. You do not need a bandage unless otherwise instructed. Do not apply any ointments or creams to your incision. If you have open wounds you will be instructed how to care for them or a visiting nurse may be arranged for you. If you have staples or sutures along your incision they will be removed at your post-op appointment. You may have skin glue on your incision. Do not peel it off. It will come off on its own in about one week.  Wash the groin wound with soap and water daily and pat dry. (No tub bath-only shower)  Then put a dry gauze or washcloth in the groin to keep this area dry to help prevent wound infection.  Do this daily and as needed.  Do not use Vaseline or neosporin on your incisions.  Only use soap and water on your incisions and then protect and keep dry.  Diet  Resume your normal diet. There are no special food restrictions following this procedure. A low fat/ low cholesterol diet is  recommended for all patients with vascular disease. In order to heal from your surgery, it is CRITICAL to get adequate nutrition. Your body requires vitamins, minerals, and protein. Vegetables are the best source of vitamins and minerals. Vegetables also provide the perfect balance of protein. Processed food has little nutritional value, so try to avoid this.  Medications  Resume taking all your medications unless your doctor or physician assistant tells you not to. If your incision is causing pain, you may take over-the-counter pain relievers such as acetaminophen (Tylenol). If you were prescribed a stronger pain medication, please aware these medication can cause nausea and constipation. Prevent nausea by taking the medication with a snack or meal. Avoid constipation by drinking plenty of fluids and eating foods with high amount of fiber, such as fruits, vegetables, and grains. Take Colace 100 mg (an over-the-counter stool softener) twice a day as needed for constipation.  Do not take Tylenol if you are taking prescription pain medications.  Follow Up  Our office will schedule a follow up appointment 2-3 weeks following discharge.  Please call us immediately for any of the following conditions  Severe or worsening pain in your legs or feet while at rest or while walking Increase pain, redness, warmth, or drainage (pus) from your incision site(s) Fever of 101 degree or higher The swelling in your leg with the bypass suddenly worsens and becomes more painful than when you were in the hospital If you have   been instructed to feel your graft pulse then you should do so every day. If you can no longer feel this pulse, call the office immediately. Not all patients are given this instruction.  Leg swelling is common after leg bypass surgery.  The swelling should improve over a few months following surgery. To improve the swelling, you may elevate your legs above the level of your heart while you are  sitting or resting. Your surgeon or physician assistant may ask you to apply an ACE wrap or wear compression (TED) stockings to help to reduce swelling.  Reduce your risk of vascular disease  Stop smoking. If you would like help call [facility] at 1-800-QUIT-NOW (1-800-784-8669) or [facility] at 336-586-4000.  Manage your cholesterol Maintain a desired weight Control your diabetes weight Control your diabetes Keep your blood pressure down  If you have any questions, please call the office at [phone]  

## 2017-12-11 NOTE — Interval H&P Note (Signed)
History and Physical Interval Note:  12/11/2017 7:24 AM  Jason [name]  has presented today for surgery, with the diagnosis of claudication  The various methods of treatment have been discussed with the patient and family. After consideration of risks, benefits and other options for treatment, the patient has consented to  Procedure(s): BYPASS GRAFT FEMORAL-ABOVE KNEE POPLITEAL ARTERY (Right) as a surgical intervention .  The patient's history has been reviewed, patient examined, no change in status, stable for surgery.  I have reviewed the patient's chart and labs.  Questions were answered to the patient's satisfaction.     [name]

## 2017-12-12 ENCOUNTER — Other Ambulatory Visit: Payer: Self-pay

## 2017-12-12 ENCOUNTER — Telehealth: Payer: Self-pay | Admitting: Vascular Surgery

## 2017-12-12 ENCOUNTER — Encounter (HOSPITAL_COMMUNITY): Payer: Self-pay | Admitting: Vascular Surgery

## 2017-12-12 DIAGNOSIS — Z48812 Encounter for surgical aftercare following surgery on the circulatory system: Secondary | ICD-10-CM

## 2017-12-12 DIAGNOSIS — I739 Peripheral vascular disease, unspecified: Secondary | ICD-10-CM

## 2017-12-12 LAB — BASIC METABOLIC PANEL
ANION GAP: 10 (ref 5–15)
BUN: 9 mg/dL (ref 6–20)
CALCIUM: 8.7 mg/dL — AB (ref 8.9–10.3)
CO2: 25 mmol/L (ref 22–32)
Chloride: 101 mmol/L (ref 101–111)
Creatinine, Ser: 0.8 mg/dL (ref 0.61–1.24)
GFR calc Af Amer: >60 mL/min (ref >60)
GLUCOSE: 133 mg/dL — AB (ref 65–99)
POTASSIUM: 3.7 mmol/L (ref 3.5–5.1)
Sodium: 136 mmol/L (ref 135–145)

## 2017-12-12 LAB — CBC
HCT: 37.6 % — ABNORMAL LOW (ref 39.0–52.0)
Hemoglobin: 12.4 g/dL — ABNORMAL LOW (ref 13.0–17.0)
MCH: 28.9 pg (ref 26.0–34.0)
MCHC: 33 g/dL (ref 30.0–36.0)
MCV: 87.6 fL (ref 78.0–100.0)
PLATELETS: 340 10*3/uL (ref 150–400)
RBC: 4.29 MIL/uL (ref 4.22–5.81)
RDW: 13.1 % (ref 11.5–15.5)
WBC: 17.7 10*3/uL — AB (ref 4.0–10.5)

## 2017-12-12 LAB — GLUCOSE, CAPILLARY: GLUCOSE-CAPILLARY: 149 mg/dL — AB (ref 65–99)

## 2017-12-12 MED ORDER — OXYCODONE-ACETAMINOPHEN 5-325 MG PO TABS: 1.0000 | ORAL_TABLET | Freq: Four times a day (QID) | ORAL | 0 refills | Status: DC | PRN

## 2017-12-12 NOTE — Telephone Encounter (Signed)
Spoke to pt for appts, 5-1 US and 5-2 for OV   Mld lttr

## 2017-12-12 NOTE — Discharge Summary (Addendum)
Discharge Summary     Jason [name] July 16, 1966 52 y.o. male  [id number]  Admission Date: 12/11/2017  Discharge Date: 12/12/2017  Physician: Sherren Kerns, MD  Admission Diagnosis: claudication   HPI:   This is a 52 y.o. male who has been experiencing bilateral calf claudication for several months. He was able to walk about 100 yards before needing to stop due to pain. 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 He has not had any rest pain or non-healing wounds, but does complain of burning and numbness in his feet due to diabetic neuropathy for which he takes gabapentin. He has been on Pletal for his claudication but this did not improve his symptoms. He smokes 2 packs of cigarettes per day and would like to quit; smoking cessation was discussed at length. He takes a daily aspirin and is on Lipitor. He also has a history of HTN, hyperlipidemia, and anxiety and depression all of which are currently stable.  Hospital Course:  The patient was admitted to the hospital and taken to the operating room on 12/11/2017 and underwent: Right femoral-above knee popliteal bypass graft with reversed saphenous vein. JP drain placed at distal surgical site.  The pt tolerated the procedure well and was transported to the PACU in good condition.   By POD 1, he was able to walk in the hallway without pain. His surgical incisions were clean and dry without erythema. He had been eating and drinking, urinating and passing flatus. JP drain with minimal output.  The remainder of the hospital course consisted of increasing mobilization and increasing intake of solids without difficulty.  CBC    Component Value Date/Time   WBC 17.7 (H) 12/12/2017 0435   RBC 4.29 12/12/2017 0435   HGB 12.4 (L) 12/12/2017 0435   HCT 37.6 (L) 12/12/2017 0435   PLT 340 12/12/2017 0435   MCV 87.6 12/12/2017 0435   MCH 28.9 12/12/2017 0435   MCHC 33.0 12/12/2017 0435   RDW 13.1  12/12/2017 0435   LYMPHSABS 2.2 08/30/2016 1721   MONOABS 1.0 08/30/2016 1721   EOSABS 0.3 08/30/2016 1721   BASOSABS 0.1 08/30/2016 1721    BMET    Component Value Date/Time   NA 136 12/12/2017 0435   K 3.7 12/12/2017 0435   CL 101 12/12/2017 0435   CO2 25 12/12/2017 0435   GLUCOSE 133 (H) 12/12/2017 0435   BUN 9 12/12/2017 0435   CREATININE 0.80 12/12/2017 0435   CALCIUM 8.7 (L) 12/12/2017 0435   GFRNONAA >60 12/12/2017 0435   GFRAA >60 12/12/2017 0435       Discharge Diagnosis:  claudication  Secondary Diagnosis: Patient Active Problem List   Diagnosis Date Noted  . PAD (peripheral artery disease) (HCC) 12/11/2017  . Preoperative cardiovascular examination 12/05/2017  . Peripheral arterial disease (HCC) 10/23/2017  . History of MRSA infection 10/23/2017  . Intermittent claudication (HCC) 10/17/2017  . Herniated cervical intervertebral disc 08/01/2017  . Osteoarthritis of spine 08/01/2017  . Diabetes mellitus without complication (HCC) 07/11/2017  . Polyarthralgia 06/27/2017  . Neck pain 06/27/2017  . Primary osteoarthritis of both knees 05/02/2017  . Sensory neuronopathy 01/14/2017  . Transaminasemia 10/10/2016  . Vitamin B 12 deficiency 09/19/2016  . Hyperglycemia 09/19/2016  . Chest pressure 07/04/2012  . Cough 07/04/2012  . Hyperlipidemia 10/12/2010  . ANXIETY 10/12/2010  . History of alcohol abuse 10/12/2010  . TOBACCO USE 10/12/2010  . DEPRESSION 10/12/2010  . Essential hypertension, benign 10/12/2010  .  GERD 10/12/2010  . Osteoarthritis 10/12/2010  . Sleep apnea 10/12/2010   Past Medical History:  Diagnosis Date  . Anxiety   . Depression   . Diabetes mellitus without complication (HCC)   . GERD (gastroesophageal reflux disease)   . Headache(784.0)   . Hyperlipidemia   . Hypertension   . MRSA infection    Left shin, back  . Nerve damage    BLE  . Osteoarthritis   . Peripheral vascular disease (HCC)   . Pneumonia      Allergies as of  12/12/2017      Reactions   Olmesartan Medoxomil Other (See Comments)   REACTION: dizziness      Medication List    STOP taking these medications   doxycycline 100 MG capsule Commonly known as:  VIBRAMYCIN     TAKE these medications   aspirin EC 325 MG tablet Take 1 tablet (325 mg total) by mouth daily.   atorvastatin 20 MG tablet Commonly known as:  LIPITOR TAKE 1 TABLET BY MOUTH EVERY DAY   celecoxib 100 MG capsule Commonly known as:  CELEBREX Take 1 capsule (100 mg total) by mouth 2 (two) times daily as needed. What changed:  reasons to take this   cilostazol 100 MG tablet Commonly known as:  PLETAL Take 1 tablet (100 mg total) by mouth 2 (two) times daily.   CONTOUR BLOOD GLUCOSE SYSTEM Devi Use once daily for diabetes Dx E11.9   DULoxetine 30 MG capsule Commonly known as:  CYMBALTA Take 1 capsule (30 mg total) by mouth daily.   gabapentin 300 MG capsule Commonly known as:  NEURONTIN Take 3 po in AM and 3 po QHS. What changed:    how much to take  how to take this  when to take this  additional instructions   glucose blood test strip Commonly known as:  BAYER CONTOUR TEST Test glucose once daily.  Dx E 11.9   Lancets Misc Contour Lancets.  Test glucose once daily. DxE11.9   metFORMIN 500 MG tablet Commonly known as:  GLUCOPHAGE Take 1 tablet (500 mg total) by mouth 2 (two) times daily with a meal. Take two tablets twice daily. What changed:    how much to take  how to take this  when to take this   metoprolol succinate 25 MG 24 hr tablet Commonly known as:  TOPROL-XL Take 1 tablet (25 mg total) by mouth daily.   oxyCODONE-acetaminophen 5-325 MG tablet Commonly known as:  PERCOCET/ROXICET Take 1 tablet by mouth every 6 (six) hours as needed for moderate pain.   traZODone 50 MG tablet Commonly known as:  DESYREL Take 1 tablet by mouth at bedtime as needed for insomnia       [facility] Inst[facility] Lower Extremity Bypass Surgery  Please refer to the following instruction for your post-procedure care. Your surgeon or physician assistant will discuss any changes with you.  Activity  You are encouraged to walk as much as you can. You can slowly return to normal activities during the month after your surgery. Avoid strenuous activity and heavy lifting until your doctor tells you it's OK. Avoid activities such as vacuuming or swinging a golf club. Do not drive until your doctor give the OK and you are no longer taking prescription pain medications. It is also normal to have difficulty with sleep habits, eating and bowel movement after surgery. These will go away with time.  Bathing/Showering  You may  shower after you go home. Do not soak in a bathtub, hot tub, or swim until the incision heals completely.  Incision Care  Clean your incision with mild soap and water. Shower every day. Pat the area dry with a clean towel. You do not need a bandage unless otherwise instructed. Do not apply any ointments or creams to your incision. If you have open wounds you will be instructed how to care for them or a visiting nurse may be arranged for you. If you have staples or sutures along your incision they will be removed at your post-op appointment. You may have skin glue on your incision. Do not peel it off. It will come off on its own in about one week.  Wash the groin wound with soap and water daily and pat dry. (No tub bath-only shower)  Then put a dry gauze or washcloth in the groin to keep this area dry to help prevent wound infection.  Do this daily and as needed.  Do not use Vaseline or neosporin on your incisions.  Only use soap and water on your incisions and then protect and keep dry.  Diet  Resume your normal diet. There are no special food restrictions following this procedure. A low fat/ low cholesterol diet is recommended for all patients with vascular  disease. In order to heal from your surgery, it is CRITICAL to get adequate nutrition. Your body requires vitamins, minerals, and protein. Vegetables are the best source of vitamins and minerals. Vegetables also provide the perfect balance of protein. Processed food has little nutritional value, so try to avoid this.  Medications  Resume taking all your medications unless your doctor or Physician Assistant tells you not to. If your incision is causing pain, you may take over-the-counter pain relievers such as acetaminophen (Tylenol). If you were prescribed a stronger pain medication, please aware these medication can cause nausea and constipation. Prevent nausea by taking the medication with a snack or meal. Avoid constipation by drinking plenty of fluids and eating foods with high amount of fiber, such as fruits, vegetables, and grains. Take Colace 100 mg (an over-the-counter stool softener) twice a day as needed for constipation. Do not take Tylenol if you are taking prescription pain medications.  Follow Up  Our office will schedule a follow up appointment 2-3 weeks following discharge.  Please call us immediately for any of the following conditions  .Severe or worsening pain in your legs or feet while at rest or while walking .Increase pain, redness, warmth, or drainage (pus) from your incision site(s) Fever of 101 degree or higher The swelling in your leg with the bypass suddenly worsens and becomes more painful than when you were in the hospital If you have been instructed to feel your graft pulse then you should do so every day. If you can no longer feel this pulse, call the office immediately. Not all patients are given this instruction.  Leg swelling is common after leg bypass surgery.  The swelling should improve over a few months following surgery. To improve the swelling, you may elevate your legs above the level of your heart while you are sitting or resting. Your surgeon or physician  assistant may ask you to apply an ACE wrap or wear compression (TED) stockings to help to reduce swelling.  Reduce your risk of vascular disease  Stop smoking. If you would like help call [facility] at 1-800-QUIT-NOW ((442)331-9860) or [facility] at (754)633-0325.  Manage your cholesterol Maintain a desired weight  Control your diabetes weight Control your diabetes Keep your blood pressure down  If you have any questions, please call the office at [phone]   Prescriptions given: 1.  Roxicet #30 No Refill  Disposition: Discharge to home  Patient's condition: is Good  Follow up: 1. Dr. Darrick Penna in 3 weeks with ABI   Maggie Font, PA-S [facility] [phone] 12/12/2017  7:53 AM  - For VQI Registry use ---   Post-op:  Wound infection: No  Graft infection: No  Transfusion: No    New Arrhythmia: No Ipsilateral amputation: No, [ ]  Minor, [ ]  BKA, [ ]  AKA Discharge patency: Arly.Keller ] Primary, [ ]  Primary assisted, [ ]  Secondary, [ ]  Occluded Patency judged by: Arly.Keller ] Dopper only, Arly.Keller ] Palpable graft pulse, [X]  Palpable distal pulse, [ ]  ABI inc. > 0.15, [ ]  Duplex Discharge ABI: To be done at outpatient follow-up D/C Ambulatory Status: Ambulatory  Complications: MI: No, [ ]  Troponin only, [ ]  EKG or Clinical CHF: No Resp failure:No, [ ]  Pneumonia, [ ]  Ventilator Chg in renal function: No, [ ]  Inc. Cr > 0.5, [ ]  Temp. Dialysis,  [ ]  Permanent dialysis Stroke: No, [ ]  Minor, [ ]  Major Return to OR: No  Reason for return to OR: [ ]  Bleeding, [ ]  Infection, [ ]  Thrombosis, [ ]  Revision  Discharge medications: Statin use:  yes ASA use:  yes Plavix use:  no Beta blocker use: yes CCB use:  No ACEI use:   no ARB use:  no Coumadin use: no

## 2017-12-12 NOTE — Evaluation (Signed)
Physical Therapy Evaluation and Discharge Patient Details Name: Jason [name] MRN: [id number] DOB: March 07, 1966 Today's Date: 12/12/2017   History of Present Illness  Pt. is a 52 y.o. M with significant PMH of anxiety, depression, hyperlipidemia, hypertension, diabetes, and is a current smoker. Has a several month history of pain in both calves when walking at about 100 yards. S/p right femoral to above knee popliteal bypass non reversed greater saphenous vein path angioplasty of distal vein graft.   Clinical Impression  Patient evaluated by Physical Therapy with no further acute PT needs identified. [name]. Modified independent for all transfers and ambulating up to 250 feet with no device and supervision. Did note antalgic gait with decreased weight shift to RLE, RLE external rotation, and wide BOS; patient able to correct somewhat with cueing. [name] and expect him to progress well based on prior level of function, his age, and motivation. Expresses a plan to discontinue smoking. Activity/walking recommendations and claudication walking scale discussed at length with patient; patient expresses understanding. All education has been completed and the patient has no further questions. [name]. [name]     Follow Up Recommendations No PT follow up    Equipment Recommendations  None recommended by PT    Recommendations for Other Services       Precautions / Restrictions Precautions Precautions: Fall Restrictions Weight Bearing Restrictions: No      Mobility  Bed Mobility               General bed mobility comments: OOB in recliner  Transfers Overall transfer level: Modified independent Equipment used: None             General transfer comment: Patient with increased time to stand and achieve upright  posture  Ambulation/Gait Ambulation/Gait assistance: Supervision Ambulation Distance (Feet): 250 Feet Assistive device: None Gait Pattern/deviations: Step-through pattern;Antalgic;Decreased weight shift to right;Wide base of support Gait velocity: decreased   General Gait Details: Patient initially ambulating with R foot external rotation (states this is baseline secondary to pain prior to surgery). Able to correct with neutral alignment with verbal cueing. Decreased R arm swing and R toe off during terminal stance.   Stairs            Wheelchair Mobility    Modified Rankin (Stroke Patients Only)       Balance Overall balance assessment: Mild deficits observed, not formally tested                                           Pertinent Vitals/Pain Pain Assessment: Faces Faces Pain Scale: Hurts a little bit Pain Location: R groin Pain Descriptors / Indicators: Sore Pain Intervention(s): Monitored during session    Home Living Family/patient expects to be discharged to:: Private residence Living Arrangements: Spouse/significant other;Other relatives(Wife and mother in law) Available Help at Discharge: Family Type of Home: House Home Access: Level entry(Back entrance)     Home Layout: One level Home Equipment: None Additional Comments: Grab bars in the shower    Prior Function Level of Independence: Independent         Comments: Works as a Medical sales representative; on feet frequently over Engineer, mining  Extremity/Trunk Assessment   Upper Extremity Assessment Upper Extremity Assessment: Overall WFL for tasks assessed    Lower Extremity Assessment Lower Extremity Assessment: RLE deficits/detail;LLE deficits/detail RLE Deficits / Details: At least anti gravity (3/5) hip flexion, knee flex/ext, ankle dorsiflexion/plantarflexion. Not formally assessed due to soreness RLE Sensation: WNL LLE Deficits / Details:  5/5 hip flexion, knee extension/flexion, ankle dorsiflexion MMT LLE Sensation: WNL    Cervical / Trunk Assessment Cervical / Trunk Assessment: Normal  Communication   Communication: No difficulties  Cognition Arousal/Alertness: Awake/alert Behavior During Therapy: WFL for tasks assessed/performed Overall Cognitive Status: Within Functional Limits for tasks assessed                                        General Comments      Exercises     Assessment/Plan    PT Assessment Patent does not need any further PT services  PT Problem List Decreased strength;Decreased balance;Decreased mobility;Pain       PT Treatment Interventions      PT Goals (Current goals can be found in the Care Plan section)  Acute Rehab PT Goals Patient Stated Goal: Return to work PT Goal Formulation: With patient Time For Goal Achievement: 12/26/17 Potential to Achieve Goals: Good    Frequency     Barriers to discharge        Co-evaluation               AM-PAC PT "6 Clicks" Daily Activity  Outcome Measure Difficulty turning over in bed (including adjusting bedclothes, sheets and blankets)?: None Difficulty moving from lying on back to sitting on the side of the bed? : None Difficulty sitting down on and standing up from a chair with arms (e.g., wheelchair, bedside commode, etc,.)?: None Help needed moving to and from a bed to chair (including a wheelchair)?: None Help needed walking in hospital room?: A Little Help needed climbing 3-5 steps with a railing? : A Little 6 Click Score: 22    End of Session   Activity Tolerance: Patient tolerated treatment well Patient left: in chair;with call bell/phone within reach Nurse Communication: Mobility status PT Visit Diagnosis: Unsteadiness on feet (R26.81);Pain Pain - Right/Left: Right Pain - part of body: Leg    Time: 0757-0819 PT Time Calculation (min) (ACUTE ONLY): 22 min   Charges:   PT Evaluation $PT Eval  Moderate Complexity: 1 Mod     PT G Codes:       Laurina Bustlearoline Marykathleen Russi, PT, DPT Acute Rehabilitation Services  Pager: [phone]  Vanetta MuldersCarloine H Shanae Luo 12/12/2017, 8:33 AM

## 2017-12-12 NOTE — Progress Notes (Signed)
OT Cancellation Note  Patient Details Name: Jason [name] MRN: [id number] DOB: 1965-09-07   Cancelled Treatment:    Reason Eval/Treat Not Completed: OT screened, no needs identified, will sign off. Discussed with PT. Pt completing ADL at modified independent level and no further OT needs. Will sign off. Thank you for this referral!  Jason Sectionharity A Jyair Kiraly, MS OTR/L  Pager: [phone]   [facility] Hospital[facility]ity A Audriana Aldama 12/12/2017, 10:53 AM

## 2017-12-12 NOTE — Telephone Encounter (Signed)
-----   Message from Sharee PimpleMarilyn K McChesney, RN sent at 12/11/2017 12:06 PM EDT ----- Regarding: 2-3 weeks w/ ABIs   ----- Message ----- From: Dara Lordshyne, Samantha J, PA-C Sent: 12/11/2017  11:43 AM To: [facility] Charge Pool  S/p right fem pop bypass.  F/u with Dr. Darrick PennaFields in 2-3 weeks with ABI's.  Thanks

## 2017-12-12 NOTE — Progress Notes (Addendum)
  Progress Note    12/12/2017 7:32 AM 1 Day Post-Op  Subjective:  Patient feeling good after RLE bypass, wants to go home. He is eating, has been up and walking, passing flatus, and urinating. He denies pain, weakness, and swelling.  Tmax: 99 HR: 70s-80s BP: 100s-120s systolic, 60s diastolic O2: 80s-90s, RA and [place]  Vitals:   12/11/17 2345 12/12/17 0407  BP: 127/61 120/69  Pulse: 88 77  Resp: 18 (!) 21  Temp: 98.7 F (37.1 C) 99 F (37.2 C)  SpO2: 94% 92%    Physical Exam: General: Sitting in chair, comfortable, drinking coffee Lungs:  Non-labored Incisions: Clean and dry without erythema. Drain in place at distal surgical site. Extremities:  Warm and well perfused with 2+ palpable pulses Abdomen:  Passing flatus, no pain  CBC    Component Value Date/Time   WBC 17.7 (H) 12/12/2017 0435   RBC 4.29 12/12/2017 0435   HGB 12.4 (L) 12/12/2017 0435   HCT 37.6 (L) 12/12/2017 0435   PLT 340 12/12/2017 0435   MCV 87.6 12/12/2017 0435   MCH 28.9 12/12/2017 0435   MCHC 33.0 12/12/2017 0435   RDW 13.1 12/12/2017 0435   LYMPHSABS 2.2 08/30/2016 1721   MONOABS 1.0 08/30/2016 1721   EOSABS 0.3 08/30/2016 1721   BASOSABS 0.1 08/30/2016 1721    BMET    Component Value Date/Time   NA 136 12/12/2017 0435   K 3.7 12/12/2017 0435   CL 101 12/12/2017 0435   CO2 25 12/12/2017 0435   GLUCOSE 133 (H) 12/12/2017 0435   BUN 9 12/12/2017 0435   CREATININE 0.80 12/12/2017 0435   CALCIUM 8.7 (L) 12/12/2017 0435   GFRNONAA >60 12/12/2017 0435   GFRAA >60 12/12/2017 0435    INR    Component Value Date/Time   INR 0.96 12/06/2017 0909     Intake/Output Summary (Last 24 hours) at 12/12/2017 0732 Last data filed at 12/12/2017 0407 Gross per 24 hour  Intake 2860 ml  Output 2540 ml  Net 320 ml     Assessment:  52 y.o. male is s/p:  Right femoral-above knee popliteal bypass using reversed saphenous vein 1 Day Post-Op  Plan: - Remove drain and discharge home today. -  Follow up in office in 3 weeks with ABI - Take medications as directed on discharge paperwork - Encouraged smoking cessation; patient states he has a plan to cut down from 40 to 20 cigarettes per day and will be starting Chantix next week with his wife.   Maggie FontLaura K Tastad, PA-S [facility] [phone] 12/12/2017 7:32 AM  Agree with above.  D/c drain.  Brisk biphasic PT DP doppler flow on my exam D/c home  Fabienne Brunsharles Jemina Scahill, MD [facility] [place] Office: [phone] Pager: [phone]

## 2017-12-13 LAB — GLUCOSE, CAPILLARY: Glucose-Capillary: 149 mg/dL — ABNORMAL HIGH (ref 65–99)

## 2017-12-20 ENCOUNTER — Encounter: Payer: Self-pay | Admitting: Physician Assistant

## 2017-12-20 ENCOUNTER — Ambulatory Visit (INDEPENDENT_AMBULATORY_CARE_PROVIDER_SITE_OTHER): Payer: BLUE CROSS/BLUE SHIELD | Admitting: Physician Assistant

## 2017-12-20 ENCOUNTER — Other Ambulatory Visit: Payer: Self-pay

## 2017-12-20 VITALS — BP 160/78 | HR 80 | Temp 97.6°F | Resp 18 | Ht 70.0 in | Wt 216.0 lb

## 2017-12-20 DIAGNOSIS — Z95828 Presence of other vascular implants and grafts: Secondary | ICD-10-CM

## 2017-12-20 MED ORDER — OXYCODONE-ACETAMINOPHEN 5-325 MG PO TABS: 1.0000 | ORAL_TABLET | Freq: Four times a day (QID) | ORAL | 0 refills | Status: DC | PRN

## 2017-12-20 MED ORDER — CEPHALEXIN 500 MG PO CAPS: 500.0000 mg | ORAL_CAPSULE | Freq: Three times a day (TID) | ORAL | 0 refills | Status: DC

## 2017-12-20 MED ORDER — DULOXETINE HCL 30 MG PO CPEP: 30.0000 mg | ORAL_CAPSULE | Freq: Every day | ORAL | 0 refills | Status: DC

## 2017-12-20 NOTE — Progress Notes (Signed)
POST OPERATIVE OFFICE NOTE    CC:  F/u for surgery  HPI:  This is a 52 y.o. male who is s/p Right femoral-above knee popliteal bypass graft with reversed saphenous vein on 12/11/17 by Dr. Darrick Penna.  His post operative course was unremarkable and he was discharged home on post op day one.  He returns today with complaints of pain in his right groin.  He states that his right foot is much improved from pre op.  He is having some swelling in the right leg.  Improves with leg elevation.  He inquires about going back to work.  He has not had much luck with quitting smoking.  He goes to his PCP on Friday and will get Chantix.    Allergies  Allergen Reactions  . Olmesartan Medoxomil Other (See Comments)    REACTION: dizziness    Current Outpatient Medications  Medication Sig Dispense Refill  . aspirin EC 325 MG tablet Take 1 tablet (325 mg total) by mouth daily. 30 tablet 0  . atorvastatin (LIPITOR) 20 MG tablet TAKE 1 TABLET BY MOUTH EVERY DAY 90 tablet 1  . Blood Glucose Monitoring Suppl (CONTOUR BLOOD GLUCOSE SYSTEM) DEVI Use once daily for diabetes Dx E11.9 1 Device 0  . celecoxib (CELEBREX) 100 MG capsule Take 1 capsule (100 mg total) by mouth 2 (two) times daily as needed. (Patient taking differently: Take 100 mg by mouth 2 (two) times daily as needed for mild pain. ) 180 capsule 0  . cilostazol (PLETAL) 100 MG tablet Take 1 tablet (100 mg total) by mouth 2 (two) times daily. 60 tablet 5  . DULoxetine (CYMBALTA) 30 MG capsule Take 1 capsule (30 mg total) by mouth daily. 30 capsule 3  . gabapentin (NEURONTIN) 300 MG capsule Take 3 po in AM and 3 po QHS. (Patient taking differently: Take 900 mg by mouth 2 (two) times daily. Take 3 po in AM and 3 po QHS.) 540 capsule 1  . glucose blood (BAYER CONTOUR TEST) test strip Test glucose once daily.  Dx E 11.9 100 each 3  . Lancets MISC Contour Lancets.  Test glucose once daily. DxE11.9 100 each 3  . metFORMIN (GLUCOPHAGE) 500 MG tablet Take 1 tablet  (500 mg total) by mouth 2 (two) times daily with a meal. Take two tablets twice daily.    . metoprolol succinate (TOPROL-XL) 25 MG 24 hr tablet Take 1 tablet (25 mg total) by mouth daily. 90 tablet 2  . oxyCODONE-acetaminophen (PERCOCET/ROXICET) 5-325 MG tablet Take 1 tablet by mouth every 6 (six) hours as needed for moderate pain. 30 tablet 0  . traZODone (DESYREL) 50 MG tablet Take 1 tablet by mouth at bedtime as needed for insomnia 30 tablet 3   No current facility-administered medications for this visit.      ROS:  See HPI  Physical Exam: Vitals:   12/20/17 1433 12/20/17 1437  BP: (!) 148/80 (!) 160/78  Pulse: 80 80  Resp: 18   Temp: 97.6 F (36.4 C)   SpO2: 97%    Vitals:   12/20/17 1433  Weight: 216 lb (98 kg)  Height: 5\' 10"  (1.778 m)    Incision:  Right groin incision is intact with erythema and swelling.  There is mild serosanguinous drainage.   Other right leg incisions are healing nicely.    Extremities:  +palpable right DP pulse; 1+ swelling RLE   Assessment/Plan:  This is a 52 y.o. male who is s/p: Right femoral-above knee popliteal bypass graft  with reversed saphenous vein on 12/11/17 by Dr. Darrick PennaFields  -pt's bypass is patent with palpable right DP pulse and pre op sx improved. -he does have erythema in his right groin; he has not had any fevers and feels well other than some pain in his right groin.  Pt given rx for Keflex 500mg  tid x 7 days as well as Percocet 5/325 one po q6h prn pain #12 (twelve) no refill.   -discussed in great detail with pt and his wife to keep groin dry unless he is in the shower and use gauze to wick the moisture.  He has a follow up appointment with Dr. Darrick PennaFields on 12/28/17 (ABI on 12/27/17).  He knows to call sooner if the erythema doesn't improve or worsens or starts draining or fevers.  He and his wife both expressed understanding.  -discussed with pt that he can gradually start back working on Monday without heavy lifting. -he has f/u  appointment with Dr. Caryl NeverBurchette on Friday -discussed importance of smoking cessation with pt and his wife.   Jason MassedSamantha Ronna Herskowitz, PA-C [facility] [phone]  Clinic MD:  Imogene Burnhen

## 2017-12-20 NOTE — Progress Notes (Signed)
Vitals:   12/20/17 1433  BP: (!) 148/80  Pulse: 80  Resp: 18  Temp: 97.6 F (36.4 C)  TempSrc: Oral  SpO2: 97%  Weight: 216 lb (98 kg)  Height: 5\' 10"  (1.778 m)

## 2017-12-22 ENCOUNTER — Encounter: Payer: Self-pay | Admitting: Family Medicine

## 2017-12-22 ENCOUNTER — Ambulatory Visit (INDEPENDENT_AMBULATORY_CARE_PROVIDER_SITE_OTHER): Payer: BLUE CROSS/BLUE SHIELD | Admitting: Family Medicine

## 2017-12-22 VITALS — BP 124/64 | HR 76 | Temp 98.2°F | Wt 214.9 lb

## 2017-12-22 DIAGNOSIS — F17200 Nicotine dependence, unspecified, uncomplicated: Secondary | ICD-10-CM | POA: Diagnosis not present

## 2017-12-22 DIAGNOSIS — E1165 Type 2 diabetes mellitus with hyperglycemia: Secondary | ICD-10-CM

## 2017-12-22 DIAGNOSIS — E7801 Familial hypercholesterolemia: Secondary | ICD-10-CM

## 2017-12-22 DIAGNOSIS — I739 Peripheral vascular disease, unspecified: Secondary | ICD-10-CM | POA: Diagnosis not present

## 2017-12-22 MED ORDER — VARENICLINE TARTRATE 1 MG PO TABS: 1.0000 mg | ORAL_TABLET | Freq: Two times a day (BID) | ORAL | 1 refills | Status: DC

## 2017-12-22 MED ORDER — VARENICLINE TARTRATE 0.5 MG X 11 & 1 MG X 42 PO MISC: ORAL | 0 refills | Status: DC

## 2017-12-22 MED ORDER — CELECOXIB 100 MG PO CAPS: 100.0000 mg | ORAL_CAPSULE | Freq: Two times a day (BID) | ORAL | 1 refills | Status: DC

## 2017-12-22 NOTE — Progress Notes (Signed)
Subjective:     Patient ID: Jason [name], male   DOB: 25-May-1966, 52 y.o.   MRN: [id number]  HPI Patient seen for medical follow-up. He had recent surgery for claudication right lower extremity. That went well. Symptomatically improved. He has plans to return to work next week. He is here to discuss smoking cessation among other things.   He wants to try Chantix. Currently smokes about 2 packs cigarettes per day. He quit drinking little over year ago with long-standing history of alcohol abuse. No recent relapse.  Has not tried any methods for smoking cessation previously. He understands the importance of quitting  Type 2 diabetes on metformin. Denies any polydipsia or polyuria.  He is on Lipitor for hyperlipidemia. Overdue for lipids. Previous lipids have not been optimally controlled. He states he is compliant with taking Lipitor 20 mg daily. Will probably need titration in dosage.  Past Medical History:  Diagnosis Date  . Anxiety   . Depression   . Diabetes mellitus without complication (HCC)   . GERD (gastroesophageal reflux disease)   . Headache(784.0)   . Hyperlipidemia   . Hypertension   . MRSA infection    Left shin, back  . Nerve damage    BLE  . Osteoarthritis   . Peripheral vascular disease (HCC)   . Pneumonia    Past Surgical History:  Procedure Laterality Date  . ABDOMINAL AORTOGRAM W/LOWER EXTREMITY N/A 12/01/2017   Procedure: ABDOMINAL AORTOGRAM W/LOWER EXTREMITY;  Surgeon: Sherren KernsFields, Charles E, MD;  Location: [facility];  Service: Cardiovascular;  Laterality: N/A;  . FEMORAL-POPLITEAL BYPASS GRAFT Right 12/11/2017   Procedure: BYPASS GRAFT RIGHT FEMORAL-ABOVE KNEE POPLITEAL ARTERY with Non Reversed Greater Saphenous Vein and Angioplasty distal vein Graft.;  Surgeon: Sherren KernsFields, Charles E, MD;  [facility];  Service: Vascular;  Laterality: Right;  . PERIPHERAL VASCULAR BALLOON ANGIOPLASTY Right 12/01/2017   Procedure: PERIPHERAL VASCULAR BALLOON  ANGIOPLASTY;  Surgeon: Sherren KernsFields, Charles E, MD;  Location: [facility];  Service: Cardiovascular;  Laterality: Right;  superficial femoral, attempted  . PERIPHERAL VASCULAR INTERVENTION Left 12/01/2017   Procedure: PERIPHERAL VASCULAR INTERVENTION;  Surgeon: Sherren KernsFields, Charles E, MD;  [facility];  Service: Cardiovascular;  Laterality: Left;  External iliac  . ROTATOR CUFF REPAIR    . TONSILLECTOMY    . VEIN HARVEST Right 12/11/2017   Procedure: RIGHT GREATER SAPHENOUS VEIN HARVEST;  Surgeon: Sherren KernsFields, Charles E, MD;  [facility];  Service: Vascular;  Laterality: Right;    reports that he has been smoking cigarettes.  He has a 50.00 pack-year smoking history. He has never used smokeless tobacco. He reports that he drank about 12.0 oz of alcohol per week. He reports that he does not use drugs. family history includes Arthritis in his mother; Asthma in his maternal grandfather; Heart attack (age of onset: 7848) in his father; Heart disease in his other; Hypertension in his father; Mental illness in his other; Stroke in his paternal grandfather. Allergies  Allergen Reactions  . Olmesartan Medoxomil Other (See Comments)    REACTION: dizziness     Review of Systems  Constitutional: Negative for fatigue.  Eyes: Negative for visual disturbance.  Respiratory: Negative for cough, chest tightness and shortness of breath.   Cardiovascular: Negative for chest pain, palpitations and leg swelling.  Neurological: Negative for dizziness, syncope, weakness, light-headedness and headaches.       Objective:   Physical Exam  Constitutional: He is oriented to person, place, and time. He  appears well-developed and well-nourished.  HENT:  Right Ear: External ear normal.  Left Ear: External ear normal.  Mouth/Throat: Oropharynx is clear and moist.  Eyes: Pupils are equal, round, and reactive to light.  Neck: Neck supple. No thyromegaly present.  Cardiovascular: Normal rate and regular  rhythm.  Pulmonary/Chest: Effort normal and breath sounds normal. No respiratory distress. He has no wheezes. He has no rales.  Musculoskeletal:  He has some mild edema right lower extremity from recent surgery. Wounds from previous bypass surgery look good other than some very mild erythema of his most proximal wound which is currently being treated with Keflex per surgery  Neurological: He is alert and oriented to person, place, and time.       Assessment:     #1 peripheral vascular disease with recent femoral-popliteal bypass-doing well  #2 ongoing nicotine use  #3 hyperlipidemia  #4 past history of alcohol abuse  #5 type 2 diabetes      Plan:     -Ordered future labs with A1c, basic metabolic panel, hepatic panel, lipid panel -Refilled Celebrex 100 mg to use as needed 1 twice daily -Smoking cessation discussed with handout given. Trial of Chantix starter pack and maintenance pack. We reviewed potential side effects. -We'll aim for LDL cholesterol less than 70  [name] MD [facility]

## 2017-12-22 NOTE — Patient Instructions (Signed)
Steps to Quit Smoking Smoking tobacco can be harmful to your health and can affect almost every organ in your body. Smoking puts you, and those around you, at risk for developing many serious chronic diseases. Quitting smoking is difficult, but it is one of the best things that you can do for your health. It is never too late to quit. What are the benefits of quitting smoking? When you quit smoking, you lower your risk of developing serious diseases and conditions, such as:  Lung cancer or lung disease, such as COPD.  Heart disease.  Stroke.  Heart attack.  Infertility.  Osteoporosis and bone fractures.  Additionally, symptoms such as coughing, wheezing, and shortness of breath may get better when you quit. You may also find that you get sick less often because your body is stronger at fighting off colds and infections. If you are pregnant, quitting smoking can help to reduce your chances of having a baby of low birth weight. How do I get ready to quit? When you decide to quit smoking, create a plan to make sure that you are successful. Before you quit:  Pick a date to quit. Set a date within the next two weeks to give you time to prepare.  Write down the reasons why you are quitting. Keep this list in places where you will see it often, such as on your bathroom mirror or in your car or wallet.  Identify the people, places, things, and activities that make you want to smoke (triggers) and avoid them. Make sure to take these actions: ? Throw away all cigarettes at home, at work, and in your car. ? Throw away smoking accessories, such as ashtrays and lighters. ? Clean your car and make sure to empty the ashtray. ? Clean your home, including curtains and carpets.  Tell your family, friends, and coworkers that you are quitting. Support from your loved ones can make quitting easier.  Talk with your health care provider about your options for quitting smoking.  Find out what treatment  options are covered by your health insurance.  What strategies can I use to quit smoking? Talk with your healthcare provider about different strategies to quit smoking. Some strategies include:  Quitting smoking altogether instead of gradually lessening how much you smoke over a period of time. Research shows that quitting "cold turkey" is more successful than gradually quitting.  Attending in-person counseling to help you build problem-solving skills. You are more likely to have success in quitting if you attend several counseling sessions. Even short sessions of 10 minutes can be effective.  Finding resources and support systems that can help you to quit smoking and remain smoke-free after you quit. These resources are most helpful when you use them often. They can include: ? Online chats with a counselor. ? Telephone quitlines. ? Printed self-help materials. ? Support groups or group counseling. ? Text messaging programs. ? Mobile phone applications.  Taking medicines to help you quit smoking. (If you are pregnant or breastfeeding, talk with your health care provider first.) Some medicines contain nicotine and some do not. Both types of medicines help with cravings, but the medicines that include nicotine help to relieve withdrawal symptoms. Your health care provider may recommend: ? Nicotine patches, gum, or lozenges. ? Nicotine inhalers or sprays. ? Non-nicotine medicine that is taken by mouth.  Talk with your health care provider about combining strategies, such as taking medicines while you are also receiving in-person counseling. Using these two strategies together   makes you more likely to succeed in quitting than if you used either strategy on its own. If you are pregnant or breastfeeding, talk with your health care provider about finding counseling or other support strategies to quit smoking. Do not take medicine to help you quit smoking unless told to do so by your health care  provider. What things can I do to make it easier to quit? Quitting smoking might feel overwhelming at first, but there is a lot that you can do to make it easier. Take these important actions:  Reach out to your family and friends and ask that they support and encourage you during this time. Call telephone quitlines, reach out to support groups, or work with a counselor for support.  Ask people who smoke to avoid smoking around you.  Avoid places that trigger you to smoke, such as bars, parties, or smoke-break areas at work.  Spend time around people who do not smoke.  Lessen stress in your life, because stress can be a smoking trigger for some people. To lessen stress, try: ? Exercising regularly. ? Deep-breathing exercises. ? Yoga. ? Meditating. ? Performing a body scan. This involves closing your eyes, scanning your body from head to toe, and noticing which parts of your body are particularly tense. Purposefully relax the muscles in those areas.  Download or purchase mobile phone or tablet apps (applications) that can help you stick to your quit plan by providing reminders, tips, and encouragement. There are many free apps, such as QuitGuide from the CDC (Centers for Disease Control and Prevention). You can find other support for quitting smoking (smoking cessation) through smokefree.gov and other websites.  How will I feel when I quit smoking? Within the first 24 hours of quitting smoking, you may start to feel some withdrawal symptoms. These symptoms are usually most noticeable 2-3 days after quitting, but they usually do not last beyond 2-3 weeks. Changes or symptoms that you might experience include:  Mood swings.  Restlessness, anxiety, or irritation.  Difficulty concentrating.  Dizziness.  Strong cravings for sugary foods in addition to nicotine.  Mild weight gain.  Constipation.  Nausea.  Coughing or a sore throat.  Changes in how your medicines work in your  body.  A depressed mood.  Difficulty sleeping (insomnia).  After the first 2-3 weeks of quitting, you may start to notice more positive results, such as:  Improved sense of smell and taste.  Decreased coughing and sore throat.  Slower heart rate.  Lower blood pressure.  Clearer skin.  The ability to breathe more easily.  Fewer sick days.  Quitting smoking is very challenging for most people. Do not get discouraged if you are not successful the first time. Some people need to make many attempts to quit before they achieve long-term success. Do your best to stick to your quit plan, and talk with your health care provider if you have any questions or concerns. This information is not intended to replace advice given to you by your health care provider. Make sure you discuss any questions you have with your health care provider. Document Released: 08/09/2001 Document Revised: 04/12/2016 Document Reviewed: 12/30/2014 Elsevier Interactive Patient Education  2018 Elsevier Inc.  

## 2017-12-27 ENCOUNTER — Ambulatory Visit (HOSPITAL_COMMUNITY)
Admission: RE | Admit: 2017-12-27 | Discharge: 2017-12-27 | Disposition: A | Discharge status: Home / Self Care | Payer: BLUE CROSS/BLUE SHIELD | Source: Ambulatory Visit | Attending: Vascular Surgery | Admitting: Vascular Surgery

## 2017-12-27 DIAGNOSIS — Z48812 Encounter for surgical aftercare following surgery on the circulatory system: Secondary | ICD-10-CM | POA: Insufficient documentation

## 2017-12-27 DIAGNOSIS — I739 Peripheral vascular disease, unspecified: Secondary | ICD-10-CM | POA: Diagnosis not present

## 2017-12-28 ENCOUNTER — Other Ambulatory Visit: Payer: Self-pay

## 2017-12-28 ENCOUNTER — Ambulatory Visit (INDEPENDENT_AMBULATORY_CARE_PROVIDER_SITE_OTHER): Payer: BLUE CROSS/BLUE SHIELD | Admitting: Vascular Surgery

## 2017-12-28 ENCOUNTER — Other Ambulatory Visit (INDEPENDENT_AMBULATORY_CARE_PROVIDER_SITE_OTHER): Payer: BLUE CROSS/BLUE SHIELD

## 2017-12-28 ENCOUNTER — Encounter: Payer: Self-pay | Admitting: Vascular Surgery

## 2017-12-28 VITALS — BP 116/70 | HR 61 | Temp 98.3°F | Resp 16 | Ht 70.0 in | Wt 212.0 lb

## 2017-12-28 DIAGNOSIS — I739 Peripheral vascular disease, unspecified: Secondary | ICD-10-CM | POA: Diagnosis not present

## 2017-12-28 DIAGNOSIS — E1165 Type 2 diabetes mellitus with hyperglycemia: Secondary | ICD-10-CM

## 2017-12-28 DIAGNOSIS — E7801 Familial hypercholesterolemia: Secondary | ICD-10-CM | POA: Diagnosis not present

## 2017-12-28 DIAGNOSIS — Z95828 Presence of other vascular implants and grafts: Secondary | ICD-10-CM

## 2017-12-28 LAB — BASIC METABOLIC PANEL
BUN: 17 mg/dL (ref 6–23)
CALCIUM: 9.6 mg/dL (ref 8.4–10.5)
CO2: 28 mEq/L (ref 19–32)
Chloride: 101 mEq/L (ref 96–112)
Creatinine, Ser: 0.81 mg/dL (ref 0.40–1.50)
GFR: 106.56 mL/min (ref >60.00)
Glucose, Bld: 139 mg/dL — ABNORMAL HIGH (ref 70–99)
Potassium: 4.5 mEq/L (ref 3.5–5.1)
Sodium: 138 mEq/L (ref 135–145)

## 2017-12-28 LAB — LIPID PANEL
Cholesterol: 177 mg/dL (ref 0–200)
HDL: 33.2 mg/dL — AB (ref >39.00)
NONHDL: 143.62
Total CHOL/HDL Ratio: 5
Triglycerides: 220 mg/dL — ABNORMAL HIGH (ref 0.0–149.0)
VLDL: 44 mg/dL — AB (ref 0.0–40.0)

## 2017-12-28 LAB — HEPATIC FUNCTION PANEL
ALK PHOS: 95 U/L (ref 39–117)
ALT: 15 U/L (ref 0–53)
AST: 12 U/L (ref 0–37)
Albumin: 4.5 g/dL (ref 3.5–5.2)
BILIRUBIN DIRECT: 0.1 mg/dL (ref 0.0–0.3)
BILIRUBIN TOTAL: 0.4 mg/dL (ref 0.2–1.2)
Total Protein: 6.9 g/dL (ref 6.0–8.3)

## 2017-12-28 LAB — LDL CHOLESTEROL, DIRECT: Direct LDL: 130 mg/dL

## 2017-12-28 LAB — HEMOGLOBIN A1C: HEMOGLOBIN A1C: 6.9 % — AB (ref 4.6–6.5)

## 2017-12-28 NOTE — Progress Notes (Signed)
POST OPERATIVE OFFICE NOTE    CC:  F/u for surgery  HPI:  This is a 52 y.o. male who is s/p right fem- above knee pop bypass and left iliac stent.  He was seen last week secondary to irritation and erythema at the right groin incision.  He was given keflex and asked to keep it clean and dry gauze daily.  He denise fever or chills and is working daily.  He states his right leg is not swollen first thing in the am, but after a day of work it is swollen.  He sates he is able to walk without calf pain and is very pleased with his surgical out come.          Allergies  Allergen Reactions  . Olmesartan Medoxomil Other (See Comments)    REACTION: dizziness    Current Outpatient Medications  Medication Sig Dispense Refill  . aspirin EC 325 MG tablet Take 1 tablet (325 mg total) by mouth daily. 30 tablet 0  . atorvastatin (LIPITOR) 20 MG tablet TAKE 1 TABLET BY MOUTH EVERY DAY 90 tablet 1  . Blood Glucose Monitoring Suppl (CONTOUR BLOOD GLUCOSE SYSTEM) DEVI Use once daily for diabetes Dx E11.9 1 Device 0  . celecoxib (CELEBREX) 100 MG capsule Take 1 capsule (100 mg total) by mouth 2 (two) times daily. 180 capsule 1  . DULoxetine (CYMBALTA) 30 MG capsule Take 1 capsule (30 mg total) by mouth daily. 90 capsule 0  . gabapentin (NEURONTIN) 300 MG capsule Take 3 po in AM and 3 po QHS. (Patient taking differently: Take 900 mg by mouth 2 (two) times daily. Take 3 po in AM and 3 po QHS.) 540 capsule 1  . glucose blood (BAYER CONTOUR TEST) test strip Test glucose once daily.  Dx E 11.9 100 each 3  . Lancets MISC Contour Lancets.  Test glucose once daily. DxE11.9 100 each 3  . metFORMIN (GLUCOPHAGE) 500 MG tablet Take 1 tablet (500 mg total) by mouth 2 (two) times daily with a meal. Take two tablets twice daily.    . metoprolol succinate (TOPROL-XL) 25 MG 24 hr tablet Take 1 tablet (25 mg total) by mouth daily. 90 tablet 2  . varenicline (CHANTIX CONTINUING MONTH PAK) 1 MG tablet Take 1 tablet (1 mg  total) by mouth 2 (two) times daily. 60 tablet 1  . varenicline (CHANTIX STARTING MONTH PAK) 0.5 MG X 11 & 1 MG X 42 tablet Take one 0.5 mg tablet by mouth once daily for 3 days, then increase to one 0.5 mg tablet twice daily for 4 days, then increase to one 1 mg tablet twice daily. 53 tablet 0   No current facility-administered medications for this visit.      ROS:  See HPI  Physical Exam:  Vitals:   12/28/17 1406  BP: 116/70  Pulse: 61  Resp: 16  Temp: 98.3 F (36.8 C)  SpO2: 96%    Incision:  dehiscence of the right groin incision without erythema or drainage.  Probed with a q-tip demonstrates no tunneling and no visible vein graft.  Lower above knee pop incision healing well. Extremities:  Right LE edema, weak palpable DP, left no edema and palpable DP/PT pulse Heart : RRR Abdomen:  Soft NTTP Lungs CTA, non labored breathing  ABI's 12/27/2017 Right 77% improved from 59% Left 93% improved from 78%  Assessment/Plan:  This is a 52 y.o. male who is s/p:Right femoral-above knee popliteal bypass graft with reversed saphenous vein on  12/11/17 by Dr. Darrick Penna.  Left external iliac stent.  He denise symptoms of claudication since his surgery.  He is back at work full time and has started on Chantix for smoking cessation.    I showed him how to perform right groin wet to dry dressing changes with normal saline and dry guaze.  He demonstrated understanding.  He was instructed to shower with dial soap daily, dry the area well and wet to dry dressing.  He has completed a round of Keflex   tid x 7 days.   F/U in 2 weeks for wound check.  He continues to take aspirin, statin and beta blocker daily.   Mosetta Pigeon PA-C [facility] [phone]  Clinic MD:  Fields  History and exam details as above.  Patient is now claudication free.  He does have some issues with his right groin wound.  He has had some separation of this.  There is no exposed graft.  The  wound is about 3 x 2 cm length and width and about 1 cm in depth.  I debrided some of this today.  We will start local wound care with normal saline wet-to-dry dressings.  He will follow-up in 2 weeks to recheck his groin wound.  Fabienne Bruns, MD [facility] [place] Office: [phone] Pager: [phone]

## 2017-12-29 ENCOUNTER — Other Ambulatory Visit: Payer: Self-pay | Admitting: Family Medicine

## 2017-12-29 MED ORDER — ATORVASTATIN CALCIUM 40 MG PO TABS: 40.0000 mg | ORAL_TABLET | Freq: Every day | ORAL | 3 refills | Status: DC

## 2018-01-04 ENCOUNTER — Ambulatory Visit (INDEPENDENT_AMBULATORY_CARE_PROVIDER_SITE_OTHER): Payer: Self-pay | Admitting: Vascular Surgery

## 2018-01-04 VITALS — BP 126/64 | HR 63 | Temp 97.2°F | Resp 18 | Ht 70.0 in | Wt 211.0 lb

## 2018-01-04 DIAGNOSIS — I739 Peripheral vascular disease, unspecified: Secondary | ICD-10-CM

## 2018-01-04 MED ORDER — OXYCODONE-ACETAMINOPHEN 5-325 MG PO TABS: 1.0000 | ORAL_TABLET | ORAL | 0 refills | Status: DC | PRN

## 2018-01-04 NOTE — Progress Notes (Signed)
Patient is a 52 year old male who returns today for follow-up after recent right femoral popliteal bypass graft bout 3 weeks ago.  He has had breakdown of his right groin wound.  He denies any fever or chills.  He is doing local wound care wet-to-dry dressings daily.  Physical exam:  Vitals:   01/04/18 1121  BP: 126/64  Pulse: 63  Resp: 18  Temp: (!) 97.2 F (36.2 C)  TempSrc: Oral  SpO2: 99%  Weight: 211 lb (95.7 kg)  Height:  (1.778 m)    Extremities: Right groin wound 4 x 3 x 2 cm in depth healthy appearing granulation tissue no graft exposed 2+ right dorsalis pedis pulse all other incisions well-healed  Assessment: Healing right groin wound with patent right leg bypass  Plan: Follow-up 2 weeks for recheck wound.  The patient was given a prescription today for Percocet No. 28 dispensed.  I told him he should not need any further pain medication after those 2 weeks.  He will continue doing his local wound care with normal saline wet-to-dry dressings.  Fabienne Bruns, MD [facility] Office: [phone] Pager: [phone]

## 2018-01-11 ENCOUNTER — Ambulatory Visit: Payer: BLUE CROSS/BLUE SHIELD | Admitting: Vascular Surgery

## 2018-01-14 ENCOUNTER — Other Ambulatory Visit: Payer: Self-pay | Admitting: Family Medicine

## 2018-01-25 ENCOUNTER — Ambulatory Visit (INDEPENDENT_AMBULATORY_CARE_PROVIDER_SITE_OTHER): Payer: Self-pay | Admitting: Vascular Surgery

## 2018-01-25 ENCOUNTER — Encounter: Payer: Self-pay | Admitting: Vascular Surgery

## 2018-01-25 ENCOUNTER — Other Ambulatory Visit: Payer: Self-pay

## 2018-01-25 VITALS — BP 121/69 | HR 73 | Temp 99.0°F | Resp 20 | Ht 70.0 in | Wt 212.0 lb

## 2018-01-25 DIAGNOSIS — I739 Peripheral vascular disease, unspecified: Secondary | ICD-10-CM

## 2018-01-25 NOTE — Progress Notes (Signed)
Patient is a 52 year old male who returns for follow-up today.  He underwent a right femoral to above-knee popliteal bypass with greater saphenous vein on December 11, 2017.  He had some breakdown of his right groin incision.  This is slowly healing with wet-to-dry dressings.  He reports that since I last saw him a few weeks ago he has had some breakdown of his above-knee popliteal incision.  He has had no bleeding.  He has no drainage.  He has no fever or chills.  Physical exam:  Vitals:   01/25/18 1435  BP: 121/69  Pulse: 73  Resp: 20  Temp: 99 F (37.2 C)  TempSrc: Oral  SpO2: 96%  Weight: 212 lb (96.2 kg)  Height:  (1.778 m)    Extremities: Right foot pink warm, 3 x 2 cm opening of his above-knee popliteal incision in his right groin incision.  These both have healthy appearing granulation tissue with no surrounding erythema or purulent drainage.  Assessment: Slowly healing wounds right lower extremity status post right femoropopliteal bypass.  The patient currently has no claudication symptoms in his right or left leg.  He recently also underwent left external iliac artery stenting.  Plan: The patient will follow-up with me in 1 month to recheck his wounds.  He will then need a surveillance duplex in July 2019.  Fabienne Bruns, MD [facility] Office: [phone] Pager: [phone]

## 2018-02-15 ENCOUNTER — Other Ambulatory Visit: Payer: Self-pay | Admitting: Family Medicine

## 2018-02-15 NOTE — Telephone Encounter (Signed)
Last OV 12/22/2017   Last refilled  01/15/2018 disp 56 with no refills   Sent to PCP for approval

## 2018-02-16 NOTE — Telephone Encounter (Signed)
Refill OK

## 2018-02-22 ENCOUNTER — Ambulatory Visit (INDEPENDENT_AMBULATORY_CARE_PROVIDER_SITE_OTHER): Payer: BLUE CROSS/BLUE SHIELD | Admitting: Vascular Surgery

## 2018-02-22 ENCOUNTER — Encounter: Payer: Self-pay | Admitting: Vascular Surgery

## 2018-02-22 VITALS — BP 129/72 | HR 68 | Temp 97.9°F | Ht 70.0 in | Wt 278.0 lb

## 2018-02-22 DIAGNOSIS — I739 Peripheral vascular disease, unspecified: Secondary | ICD-10-CM

## 2018-02-22 NOTE — Progress Notes (Signed)
Patient is a 52 year old male who returns for follow-up today.  He underwent a right femoral to above-knee popliteal bypass with vein December 11, 2017.  He also had placement of a left external iliac artery stent April 2019.  His claudication symptoms have completely resolved.  He returns today for recheck of incisions in his right leg where he had had difficulty healing of his right groin and above-knee popliteal incision.  He denies any drainage from these.  He continues to do local wound care on these.  Physical exam:  Vitals:   02/22/18 1447  BP: 129/72  Pulse: 68  Temp: 97.9 F (36.6 C)  TempSrc: Oral  SpO2: 95%  Weight: 278 lb (126.1 kg)  Height: 5\' 10"  (1.778 m)    Extremities: 2+ dorsalis pedis pulse bilaterally right leg incisions essentially healed 3 cm x 5 mm less than 1 mm depth groin incision 4 cm x 1 cm less than 1 mm depth above-knee popliteal incision  Assessment: Doing well status post right femoropopliteal and left external iliac artery stent.  No claudication symptoms at this point.  He continues to work on trying to quit smoking.  Plan: The patient will follow-up at the end of July for graft duplex scans as well as bilateral ABIs.  [name], MD [facility] [place] Office: [phone] Pager: [phone]

## 2018-03-05 ENCOUNTER — Other Ambulatory Visit: Payer: Self-pay

## 2018-03-05 DIAGNOSIS — Z95828 Presence of other vascular implants and grafts: Secondary | ICD-10-CM

## 2018-03-05 DIAGNOSIS — I739 Peripheral vascular disease, unspecified: Secondary | ICD-10-CM

## 2018-03-14 ENCOUNTER — Other Ambulatory Visit: Payer: Self-pay | Admitting: Sports Medicine

## 2018-03-16 ENCOUNTER — Other Ambulatory Visit: Payer: Self-pay | Admitting: Family Medicine

## 2018-03-27 ENCOUNTER — Ambulatory Visit (INDEPENDENT_AMBULATORY_CARE_PROVIDER_SITE_OTHER)
Admission: RE | Admit: 2018-03-27 | Discharge: 2018-03-27 | Disposition: A | Discharge status: Home / Self Care | Payer: BLUE CROSS/BLUE SHIELD | Source: Ambulatory Visit | Attending: Vascular Surgery | Admitting: Vascular Surgery

## 2018-03-27 ENCOUNTER — Ambulatory Visit (HOSPITAL_COMMUNITY)
Admission: RE | Admit: 2018-03-27 | Discharge: 2018-03-27 | Disposition: A | Discharge status: Home / Self Care | Payer: BLUE CROSS/BLUE SHIELD | Source: Ambulatory Visit | Attending: Vascular Surgery | Admitting: Vascular Surgery

## 2018-03-27 DIAGNOSIS — Z95828 Presence of other vascular implants and grafts: Secondary | ICD-10-CM | POA: Insufficient documentation

## 2018-03-27 DIAGNOSIS — R0989 Other specified symptoms and signs involving the circulatory and respiratory systems: Secondary | ICD-10-CM | POA: Diagnosis present

## 2018-03-27 DIAGNOSIS — E785 Hyperlipidemia, unspecified: Secondary | ICD-10-CM | POA: Insufficient documentation

## 2018-03-27 DIAGNOSIS — I739 Peripheral vascular disease, unspecified: Secondary | ICD-10-CM | POA: Insufficient documentation

## 2018-03-27 DIAGNOSIS — F17200 Nicotine dependence, unspecified, uncomplicated: Secondary | ICD-10-CM | POA: Insufficient documentation

## 2018-03-27 DIAGNOSIS — R9389 Abnormal findings on diagnostic imaging of other specified body structures: Secondary | ICD-10-CM | POA: Diagnosis not present

## 2018-03-27 DIAGNOSIS — I10 Essential (primary) hypertension: Secondary | ICD-10-CM | POA: Diagnosis not present

## 2018-03-29 ENCOUNTER — Ambulatory Visit: Payer: BLUE CROSS/BLUE SHIELD | Admitting: Vascular Surgery

## 2018-04-12 ENCOUNTER — Other Ambulatory Visit: Payer: Self-pay | Admitting: Family Medicine

## 2018-04-16 ENCOUNTER — Ambulatory Visit: Payer: BLUE CROSS/BLUE SHIELD | Admitting: Family Medicine

## 2018-04-16 VITALS — BP 128/64 | HR 74 | Temp 97.5°F | Wt 220.3 lb

## 2018-04-16 DIAGNOSIS — E119 Type 2 diabetes mellitus without complications: Secondary | ICD-10-CM | POA: Diagnosis not present

## 2018-04-16 DIAGNOSIS — E7801 Familial hypercholesterolemia: Secondary | ICD-10-CM

## 2018-04-16 DIAGNOSIS — I10 Essential (primary) hypertension: Secondary | ICD-10-CM

## 2018-04-16 DIAGNOSIS — R202 Paresthesia of skin: Secondary | ICD-10-CM | POA: Diagnosis not present

## 2018-04-16 DIAGNOSIS — I739 Peripheral vascular disease, unspecified: Secondary | ICD-10-CM

## 2018-04-16 NOTE — Patient Instructions (Signed)
Come back Monday for fasting labs  I will set up follow up with Dr Alvester MorinNewton.

## 2018-04-16 NOTE — Progress Notes (Signed)
Subjective:     Patient ID: Jason [name], male   DOB: 1965-10-09, 52 y.o.   MRN: [id number]  HPI Patient seen with over 2 month history of intermittent symptoms of "tingling" in his left upper extremity and along his left chest wall. Denies any chest pain whatsoever. He has history of known cervical spondylosis at multiple levels. He's had some occasional weakness upper extremity. Denies any chest heaviness or tightness. No lower extremity symptoms. Denies any neck pain currently. Patient had MRI cervical spine 07/27/17 which showed cervical spondylosis at multiple levels with predominantly left-sided involvement.   Patient had seen spine specialist in the past and had steroid injection about a year ago which helped tremendously.  Denies any recent slurred speech. No facial weakness. No visual changes. He has chronic problems including history of hypertension, peripheral artery disease, GERD, type 2 diabetes, osteoarthritis involving multiple joints, dyslipidemia, remote history of alcohol abuse, and ongoing nicotine use  He had recent vascular surgery for claudication and has done well from that standpoint. He is back working full time.  Past Medical History:  Diagnosis Date  . Anxiety   . Depression   . Diabetes mellitus without complication (HCC)   . GERD (gastroesophageal reflux disease)   . Headache(784.0)   . Hyperlipidemia   . Hypertension   . MRSA infection    Left shin, back  . Nerve damage    BLE  . Osteoarthritis   . Peripheral vascular disease (HCC)   . Pneumonia    Past Surgical History:  Procedure Laterality Date  . ABDOMINAL AORTOGRAM W/LOWER EXTREMITY N/A 12/01/2017   Procedure: ABDOMINAL AORTOGRAM W/LOWER EXTREMITY;  Surgeon: Sherren KernsFields, Charles E, MD;  [facility];  Service: Cardiovascular;  Laterality: N/A;  . FEMORAL-POPLITEAL BYPASS GRAFT Right 12/11/2017   Procedure: BYPASS GRAFT RIGHT FEMORAL-ABOVE KNEE POPLITEAL ARTERY with Non Reversed  Greater Saphenous Vein and Angioplasty distal vein Graft.;  Surgeon: Sherren KernsFields, Charles E, MD;  [facility];  Service: Vascular;  Laterality: Right;  . PERIPHERAL VASCULAR BALLOON ANGIOPLASTY Right 12/01/2017   Procedure: PERIPHERAL VASCULAR BALLOON ANGIOPLASTY;  Surgeon: Sherren KernsFields, Charles E, MD;  [facility];  Service: Cardiovascular;  Laterality: Right;  superficial femoral, attempted  . PERIPHERAL VASCULAR INTERVENTION Left 12/01/2017   Procedure: PERIPHERAL VASCULAR INTERVENTION;  Surgeon: Sherren KernsFields, Charles E, MD;  [facility];  Service: Cardiovascular;  Laterality: Left;  External iliac  . ROTATOR CUFF REPAIR    . TONSILLECTOMY    . VEIN HARVEST Right 12/11/2017   Procedure: RIGHT GREATER SAPHENOUS VEIN HARVEST;  Surgeon: Sherren KernsFields, Charles E, MD;  [facility];  Service: Vascular;  Laterality: Right;    reports that he has been smoking cigarettes. He has a 50.00 pack-year smoking history. He has never used smokeless tobacco. He reports that he drank about 20.0 standard drinks of alcohol per week. He reports that he does not use drugs. family history includes Arthritis in his mother; Asthma in his maternal grandfather; Heart attack (age of onset: 4248) in his father; Heart disease in his other; Hypertension in his father; Mental illness in his other; Stroke in his paternal grandfather. Allergies  Allergen Reactions  . Olmesartan Medoxomil Other (See Comments)    REACTION: dizziness     Review of Systems  Constitutional: Negative for fatigue and unexpected weight change.  Eyes: Negative for visual disturbance.  Respiratory: Negative for cough, chest tightness and shortness of breath.   Cardiovascular: Negative for chest pain, palpitations and  leg swelling.  Neurological: Negative for dizziness, syncope, weakness, light-headedness and headaches.  Psychiatric/Behavioral: Negative for confusion.       Objective:   Physical Exam  Constitutional: He is oriented  to person, place, and time. He appears well-developed and well-nourished.  Eyes: Pupils are equal, round, and reactive to light.  Neck: Neck supple.  No carotid bruits  Cardiovascular: Normal rate and regular rhythm.  Pulmonary/Chest: Effort normal and breath sounds normal. He has no wheezes. He has no rales.  Musculoskeletal: He exhibits no edema.  Neurological: He is alert and oriented to person, place, and time. No cranial nerve deficit.  No focal weakness.  Symmetric DTRs upper extremity.       Assessment:     #1  Patient presents with couple month history of some intermittent tingling symptoms left upper extremity. He also describes some paresthesias left lateral chest wall. He has history of known cervical spondylosis at multiple levels. Nonfocal neuro exam currently. Symptoms do not sound cardiac. EKG shows sinus rhythm with no acute changes.  #2 hyperlipidemia.  Pt on Lipitor and most recent LDL 130 and we increased his Lipitor  #3 hypertension- stable.  #4 Type 2 diabetes.    Plan:     Follow-up promptly for any weakness or recurrent left-sided neck pains  Strongly advised smoking cessation but current motivation is low  At this point, he has no neck pain and no weakness- so repeat MRI cervical spine not clearly indicated.    Future labs ordered with lipids and A1C.[name].    Lavana Huckeba W Vivien Barretto MD [facility]

## 2018-04-18 ENCOUNTER — Other Ambulatory Visit: Payer: Self-pay | Admitting: Family Medicine

## 2018-04-21 ENCOUNTER — Other Ambulatory Visit: Payer: Self-pay | Admitting: Family Medicine

## 2018-04-23 ENCOUNTER — Other Ambulatory Visit: Payer: BLUE CROSS/BLUE SHIELD

## 2018-04-23 NOTE — Telephone Encounter (Signed)
Not on medication list.  Okay to refill?

## 2018-04-23 NOTE — Telephone Encounter (Signed)
Decline.   He should not still be on this.  He had recent revascularization procedure of lower extremity

## 2018-05-04 ENCOUNTER — Other Ambulatory Visit: Payer: Self-pay | Admitting: Family Medicine

## 2018-05-04 ENCOUNTER — Encounter: Payer: Self-pay | Admitting: Family Medicine

## 2018-05-04 NOTE — Telephone Encounter (Signed)
Dr. Caryl Never,  The pt wanted to make sure that he was still being referred to Dr. Alvester Morin, or to another doctor?  The pt has not heard anything and wanted to follow up.  I did not see a referral in the chart.  Please advise. Thanks

## 2018-05-07 NOTE — Addendum Note (Signed)
Addended by: [name] on: 05/07/2018 08:35 AM   Modules accepted: Orders

## 2018-05-10 ENCOUNTER — Other Ambulatory Visit: Payer: Self-pay | Admitting: Family Medicine

## 2018-05-10 ENCOUNTER — Ambulatory Visit: Payer: BLUE CROSS/BLUE SHIELD | Admitting: Vascular Surgery

## 2018-05-11 ENCOUNTER — Encounter: Payer: Self-pay | Admitting: Family Medicine

## 2018-05-11 NOTE — Telephone Encounter (Signed)
Refill once 

## 2018-05-11 NOTE — Telephone Encounter (Signed)
Dr. Caryl NeverBurchette, please advise on refill. thanks

## 2018-05-21 ENCOUNTER — Other Ambulatory Visit: Payer: Self-pay

## 2018-05-21 ENCOUNTER — Encounter: Payer: Self-pay | Admitting: Family Medicine

## 2018-05-21 ENCOUNTER — Ambulatory Visit: Payer: BLUE CROSS/BLUE SHIELD | Admitting: Family Medicine

## 2018-05-21 VITALS — BP 150/78 | HR 77 | Temp 98.1°F | Ht 70.0 in | Wt 222.5 lb

## 2018-05-21 DIAGNOSIS — L02612 Cutaneous abscess of left foot: Secondary | ICD-10-CM | POA: Diagnosis not present

## 2018-05-21 MED ORDER — DOXYCYCLINE HYCLATE 100 MG PO CAPS: 100.0000 mg | ORAL_CAPSULE | Freq: Two times a day (BID) | ORAL | 0 refills | Status: DC

## 2018-05-21 NOTE — Patient Instructions (Signed)
Use warm salt water soaks to foot  Start the antibiotic tonight.  Follow up for any progressive redness, fever, or swelling.

## 2018-05-21 NOTE — Progress Notes (Signed)
Subjective:     Patient ID: Jason [name], male   DOB: Jun 11, 1966, 52 y.o.   MRN: [id number]  HPI Patient relates some recent painful pustular lesions left foot. Some of these have ruptured but he has a larger one on the plantar aspect which is very sore to ambulate on. He denies any fevers or chills. Had some mild redness. Does have prior history of MRSA infections. Denies any injury such as puncture wound.   Past Medical History:  Diagnosis Date  . Anxiety   . Depression   . Diabetes mellitus without complication (HCC)   . GERD (gastroesophageal reflux disease)   . Headache(784.0)   . Hyperlipidemia   . Hypertension   . MRSA infection    Left shin, back  . Nerve damage    BLE  . Osteoarthritis   . Peripheral vascular disease (HCC)   . Pneumonia    Past Surgical History:  Procedure Laterality Date  . ABDOMINAL AORTOGRAM W/LOWER EXTREMITY N/A 12/01/2017   Procedure: ABDOMINAL AORTOGRAM W/LOWER EXTREMITY;  Surgeon: Sherren KernsFields, Charles E, MD;  [facility];  Service: Cardiovascular;  Laterality: N/A;  . FEMORAL-POPLITEAL BYPASS GRAFT Right 12/11/2017   Procedure: BYPASS GRAFT RIGHT FEMORAL-ABOVE KNEE POPLITEAL ARTERY with Non Reversed Greater Saphenous Vein and Angioplasty distal vein Graft.;  Surgeon: Sherren KernsFields, Charles E, MD;  [facility];  Service: Vascular;  Laterality: Right;  . PERIPHERAL VASCULAR BALLOON ANGIOPLASTY Right 12/01/2017   Procedure: PERIPHERAL VASCULAR BALLOON ANGIOPLASTY;  Surgeon: Sherren KernsFields, Charles E, MD;  [facility];  Service: Cardiovascular;  Laterality: Right;  superficial femoral, attempted  . PERIPHERAL VASCULAR INTERVENTION Left 12/01/2017   Procedure: PERIPHERAL VASCULAR INTERVENTION;  Surgeon: Sherren KernsFields, Charles E, MD;  [facility];  Service: Cardiovascular;  Laterality: Left;  External iliac  . ROTATOR CUFF REPAIR    . TONSILLECTOMY    . VEIN HARVEST Right 12/11/2017   Procedure: RIGHT GREATER SAPHENOUS VEIN  HARVEST;  Surgeon: Sherren KernsFields, Charles E, MD;  [facility];  Service: Vascular;  Laterality: Right;    reports that he has been smoking cigarettes. He has a 50.00 pack-year smoking history. He has never used smokeless tobacco. He reports that he drank about 20.0 standard drinks of alcohol per week. He reports that he does not use drugs. family history includes Arthritis in his mother; Asthma in his maternal grandfather; Heart attack (age of onset: 1048) in his father; Heart disease in his other; Hypertension in his father; Mental illness in his other; Stroke in his paternal grandfather. Allergies  Allergen Reactions  . Olmesartan Medoxomil Other (See Comments)    REACTION: dizziness     Review of Systems  Constitutional: Negative for chills and fever.       Objective:   Physical Exam  Constitutional: He appears well-developed and well-nourished.  Cardiovascular: Normal rate and regular rhythm.  Pulmonary/Chest: Effort normal and breath sounds normal.  Skin:  Patient has approximately 1 cm superficial pustular lesion mid aspect distal plantar aspect of left foot.  Mild surrounding erythema. Tender to palpation. Blister was unroofed and we expressed some thick purulent matter       Assessment:     Left foot pustular lesions/superficial abscess. Past history of MRSA    Plan:     -we unroofed lesion above and expressed some drainage and cultured this and culture will be sent -Start doxycycline 100 mg twice daily for 10 days pending culture result -Recommend saltwater soaks twice daily -Follow-up for  any fever or any progressive redness or swelling  [name] MD [facility]

## 2018-05-23 ENCOUNTER — Other Ambulatory Visit (INDEPENDENT_AMBULATORY_CARE_PROVIDER_SITE_OTHER): Payer: BLUE CROSS/BLUE SHIELD

## 2018-05-23 DIAGNOSIS — E119 Type 2 diabetes mellitus without complications: Secondary | ICD-10-CM | POA: Diagnosis not present

## 2018-05-23 DIAGNOSIS — E7801 Familial hypercholesterolemia: Secondary | ICD-10-CM

## 2018-05-23 LAB — LIPID PANEL
CHOLESTEROL: 195 mg/dL (ref 0–200)
HDL: 25.3 mg/dL — ABNORMAL LOW (ref >39.00)
NonHDL: 169.99
Total CHOL/HDL Ratio: 8
Triglycerides: 231 mg/dL — ABNORMAL HIGH (ref 0.0–149.0)
VLDL: 46.2 mg/dL — ABNORMAL HIGH (ref 0.0–40.0)

## 2018-05-23 LAB — HEMOGLOBIN A1C: HEMOGLOBIN A1C: 9.3 % — AB (ref 4.6–6.5)

## 2018-05-23 LAB — LDL CHOLESTEROL, DIRECT: Direct LDL: 132 mg/dL

## 2018-05-24 LAB — WOUND CULTURE
MICRO NUMBER: 91139800
SPECIMEN QUALITY:: ADEQUATE

## 2018-05-29 ENCOUNTER — Other Ambulatory Visit: Payer: Self-pay | Admitting: Sports Medicine

## 2018-05-30 ENCOUNTER — Other Ambulatory Visit: Payer: Self-pay | Admitting: Family Medicine

## 2018-06-01 ENCOUNTER — Other Ambulatory Visit: Payer: Self-pay | Admitting: Family Medicine

## 2018-06-01 ENCOUNTER — Ambulatory Visit (INDEPENDENT_AMBULATORY_CARE_PROVIDER_SITE_OTHER): Payer: BLUE CROSS/BLUE SHIELD | Admitting: Physical Medicine and Rehabilitation

## 2018-06-01 ENCOUNTER — Encounter (INDEPENDENT_AMBULATORY_CARE_PROVIDER_SITE_OTHER): Payer: Self-pay | Admitting: Physical Medicine and Rehabilitation

## 2018-06-01 VITALS — BP 145/74 | HR 69 | Ht 70.0 in | Wt 220.0 lb

## 2018-06-01 DIAGNOSIS — M542 Cervicalgia: Secondary | ICD-10-CM

## 2018-06-01 DIAGNOSIS — G8929 Other chronic pain: Secondary | ICD-10-CM

## 2018-06-01 DIAGNOSIS — M25512 Pain in left shoulder: Secondary | ICD-10-CM

## 2018-06-01 DIAGNOSIS — R202 Paresthesia of skin: Secondary | ICD-10-CM | POA: Diagnosis not present

## 2018-06-01 DIAGNOSIS — M5412 Radiculopathy, cervical region: Secondary | ICD-10-CM

## 2018-06-01 NOTE — Progress Notes (Signed)
 .  Numeric Pain Rating Scale and Functional Assessment Average Pain 4 Pain Right Now 0 My pain is tingling Pain is worse with: some activites Pain improves with: rest   In the last MONTH (on 0-10 scale) has pain interfered with the following?  1. General activity like being  able to carry out your everyday physical activities such as walking, climbing stairs, carrying groceries, or moving a chair?  Rating(5)  2. Relation with others like being able to carry out your usual social activities and roles such as  activities at home, at work and in your community. Rating(0)  3. Enjoyment of life such that you have  been bothered by emotional problems such as feeling anxious, depressed or irritable?  Rating(0)

## 2018-06-01 NOTE — Telephone Encounter (Signed)
Last OV 05/21/18, Next OV 06/25/18  Last filled by Dr. [name]. Berline Chough, # [phone] with 1 refill on 06/16/2017  Please advise.

## 2018-06-03 NOTE — Telephone Encounter (Signed)
Refill OK

## 2018-06-04 ENCOUNTER — Other Ambulatory Visit: Payer: Self-pay | Admitting: Family Medicine

## 2018-06-04 NOTE — Telephone Encounter (Signed)
Copied from CRM [id number]. Topic: Quick Communication - Rx Refill/Question >> Jun 04, 2018  9:56 AM [name], Jason George wrote: Medication:  DULoxetine (CYMBALTA) 30 MG capsule   Has the patient contacted their pharmacy? yes (Agent: If no, request that the patient contact the pharmacy for the refill.) (Need authorization  Preferred Pharmacy  [id number] [place], [place] - [address] [phone] (Phone)    Agent: Please be advised that RX refills may take up to 3 business days. We ask that you follow-up with your pharmacy.

## 2018-06-04 NOTE — Telephone Encounter (Signed)
Requested medication (s) are due for refill today: yes  Requested medication (s) are on the active medication list:yes  Last refill:  05/29/18  Future visit scheduled: yes 06/25/18      Requested Prescriptions  Pending Prescriptions Disp Refills   DULoxetine (CYMBALTA) 30 MG capsule 30 capsule 0    Sig: TAKE 1 CAPSULE BY MOUTH EVERY DAY - office visit needed before next refill.     Psychiatry: Antidepressants - SNRI Failed - 06/04/2018 10:09 AM      Failed - Last BP in normal range    BP Readings from Last 1 Encounters:  06/01/18 (!) 145/74         Passed - Completed PHQ-2 or PHQ-9 in the last 360 days.      Passed - Valid encounter within last 6 months    Recent Outpatient Visits          2 weeks ago Abscess of left foot   [facility], Elberta Fortis, MD   1 month ago Paresthesia of left arm   [facility], Elberta Fortis, MD   5 months ago Peripheral arterial disease (HCC)   [facility], Elberta Fortis, MD   7 months ago Cellulitis and abscess of left leg   [facility], Elberta Fortis, MD   7 months ago Intermittent claudication [facility])   [facility], [name], DO      Future Appointments            In 3 weeks Burchette, Elberta Fortis, MD [facility] [place], [facility]

## 2018-06-14 ENCOUNTER — Other Ambulatory Visit: Payer: Self-pay | Admitting: Family Medicine

## 2018-06-25 ENCOUNTER — Ambulatory Visit: Payer: BLUE CROSS/BLUE SHIELD | Admitting: Family Medicine

## 2018-06-25 ENCOUNTER — Encounter: Payer: Self-pay | Admitting: Family Medicine

## 2018-06-25 ENCOUNTER — Other Ambulatory Visit: Payer: Self-pay

## 2018-06-25 VITALS — BP 122/64 | HR 76 | Temp 98.0°F | Ht 70.0 in | Wt 217.6 lb

## 2018-06-25 DIAGNOSIS — E1165 Type 2 diabetes mellitus with hyperglycemia: Secondary | ICD-10-CM | POA: Diagnosis not present

## 2018-06-25 DIAGNOSIS — E1159 Type 2 diabetes mellitus with other circulatory complications: Secondary | ICD-10-CM

## 2018-06-25 MED ORDER — CELECOXIB 200 MG PO CAPS: 200.0000 mg | ORAL_CAPSULE | Freq: Two times a day (BID) | ORAL | 5 refills | Status: DC

## 2018-06-25 MED ORDER — EMPAGLIFLOZIN 10 MG PO TABS: 10.0000 mg | ORAL_TABLET | Freq: Every day | ORAL | 11 refills | Status: DC

## 2018-06-25 NOTE — Patient Instructions (Signed)
Start the [place] one daily  Continue with the Metformin.

## 2018-06-25 NOTE — Progress Notes (Signed)
Subjective:     Patient ID: Jason [name], male   DOB: [birth year], 52 y.o.   MRN: [id number]  HPI Patient here for follow-up type 2 diabetes.  Recent poor control with A1c 9.3%.  His A1c back in May was 6.9%.  He is on metformin 500 mg 2 twice daily.  Fasting blood sugars been elevated.  He has some polyuria and occasional polydipsia.  He has had some weight gain.  He states he has tried to reduce sugars and starches.  We had them come in today to discuss other potential medication options  Past Medical History:  Diagnosis Date  . Anxiety   . Depression   . Diabetes mellitus without complication (HCC)   . GERD (gastroesophageal reflux disease)   . Headache(784.0)   . Hyperlipidemia   . Hypertension   . MRSA infection    Left shin, back  . Nerve damage    BLE  . Osteoarthritis   . Peripheral vascular disease (HCC)   . Pneumonia    Past Surgical History:  Procedure Laterality Date  . ABDOMINAL AORTOGRAM W/LOWER EXTREMITY N/A 12/01/2017   Procedure: ABDOMINAL AORTOGRAM W/LOWER EXTREMITY;  Surgeon: Sherren Kerns, MD;  [facility];  Service: Cardiovascular;  Laterality: N/A;  . FEMORAL-POPLITEAL BYPASS GRAFT Right 12/11/2017   Procedure: BYPASS GRAFT RIGHT FEMORAL-ABOVE KNEE POPLITEAL ARTERY with Non Reversed Greater Saphenous Vein and Angioplasty distal vein Graft.;  Surgeon: Sherren Kerns, MD;  [facility];  Service: Vascular;  Laterality: Right;  . PERIPHERAL VASCULAR BALLOON ANGIOPLASTY Right 12/01/2017   Procedure: PERIPHERAL VASCULAR BALLOON ANGIOPLASTY;  Surgeon: Sherren Kerns, MD;  [facility];  Service: Cardiovascular;  Laterality: Right;  superficial femoral, attempted  . PERIPHERAL VASCULAR INTERVENTION Left 12/01/2017   Procedure: PERIPHERAL VASCULAR INTERVENTION;  Surgeon: Sherren Kerns, MD;  [facility];  Service: Cardiovascular;  Laterality: Left;  External iliac  . ROTATOR CUFF REPAIR    . TONSILLECTOMY     . VEIN HARVEST Right 12/11/2017   Procedure: RIGHT GREATER SAPHENOUS VEIN HARVEST;  Surgeon: Sherren Kerns, MD;  Location: [facility] OR;  Service: Vascular;  Laterality: Right;    reports that he has been smoking cigarettes. He has a 50.00 pack-year smoking history. He has never used smokeless tobacco. He reports that he drank about 20.0 standard drinks of alcohol per week. He reports that he does not use drugs. family history includes Arthritis in his mother; Asthma in his maternal grandfather; Heart attack (age of onset: 79) in his father; Heart disease in his other; Hypertension in his father; Mental illness in his other; Stroke in his paternal grandfather. Allergies  Allergen Reactions  . Olmesartan Medoxomil Other (See Comments)    REACTION: dizziness     Review of Systems  Constitutional: Negative for fatigue.  Eyes: Negative for visual disturbance.  Respiratory: Negative for cough, chest tightness and shortness of breath.   Cardiovascular: Negative for chest pain, palpitations and leg swelling.  Neurological: Negative for dizziness, syncope, weakness, light-headedness and headaches.       Objective:   Physical Exam  Constitutional: He is oriented to person, place, and time. He appears well-developed and well-nourished.  HENT:  Right Ear: External ear normal.  Left Ear: External ear normal.  Mouth/Throat: Oropharynx is clear and moist.  Eyes: Pupils are equal, round, and reactive to light.  Neck: Neck supple. No thyromegaly present.  Cardiovascular: Normal rate and regular rhythm.  Pulmonary/Chest: Effort normal  and breath sounds normal. No respiratory distress. He has no wheezes. He has no rales.  Musculoskeletal: He exhibits no edema.  Neurological: He is alert and oriented to person, place, and time.       Assessment:     Type 2 diabetes poorly controlled with A1c 9.3%.  He has complications of chronic neuropathy as well as peripheral vascular disease    Plan:      -Discussed options with patient.  We have elected to add Jardiance 10 mg 1 daily to his metformin to hopefully give some cardiovascular protection as well.  We will plan follow-up in 3 months and reassess A1c then along with urine microalbumin.  -Flu vaccine recommended and patient declines.  [name] MD [facility] at [facility]

## 2018-06-26 ENCOUNTER — Other Ambulatory Visit: Payer: Self-pay | Admitting: Sports Medicine

## 2018-06-30 ENCOUNTER — Other Ambulatory Visit: Payer: Self-pay | Admitting: Sports Medicine

## 2018-07-04 ENCOUNTER — Telehealth: Payer: Self-pay | Admitting: Family Medicine

## 2018-07-04 ENCOUNTER — Encounter: Payer: Self-pay | Admitting: *Deleted

## 2018-07-04 MED ORDER — DOXYCYCLINE HYCLATE 100 MG PO TABS: 100.0000 mg | ORAL_TABLET | Freq: Two times a day (BID) | ORAL | 0 refills | Status: DC

## 2018-07-04 NOTE — Telephone Encounter (Signed)
Left detailed message on machine for patient that Rx sent.

## 2018-07-04 NOTE — Telephone Encounter (Signed)
Copied from CRM [id number]. Topic: Quick Communication - See Telephone Encounter >> Jul 04, 2018  9:06 AM Maia Petties wrote: CRM for notification. See Telephone encounter for: 07/04/18. Pt wife called stating staph infection is coming back on the bottom of his foot. Wife said that Dr. Caryl Never told them to call if it started coming back. Pt is having some pain and swelling around wound. It's a little red. Please advise if abx can be sent in. Last time was doxycycline (VIBRAMYCIN) 100 MG capsule .  [id number] [place], [place] - [address] [phone] (Phone) [phone] (Fax)

## 2018-07-04 NOTE — Telephone Encounter (Signed)
Refill doxycycline 100 mg twice daily for 10 days

## 2018-07-17 ENCOUNTER — Other Ambulatory Visit: Payer: Self-pay | Admitting: Family Medicine

## 2018-07-17 NOTE — Telephone Encounter (Signed)
Last OV 06/25/18, No future OV  Not on current med list. Please advise.

## 2018-07-17 NOTE — Telephone Encounter (Signed)
Refill #90 with one refill.  

## 2018-08-06 ENCOUNTER — Encounter (INDEPENDENT_AMBULATORY_CARE_PROVIDER_SITE_OTHER): Payer: Self-pay | Admitting: Physical Medicine and Rehabilitation

## 2018-08-06 NOTE — Progress Notes (Signed)
Jason [name] - 52 y.o. male MRN [id number]  Date of birth: [birth year]  Office Visit Note: Visit Date: 06/01/2018 PCP: [name], MD Referred by: [name], MD  Subjective: Chief Complaint  Patient presents with  . Left Arm - Tingling  . Spine - Follow-up   HPI: Jason [name] is a 52 y.o. male who comes in today For evaluation and management of left neck and arm and shoulder pain with tingling.  I last saw the patient in January and completed intra-articular injection of the glenohumeral joint.  He has been co-followed in the past by Dr. Gaspar Bidding.  We have also completed cervical epidural injection that gave him may be 50% relief.  He does a lot of physical work and this seems to exacerbate some of his activities at times.  He actually comes in today stating a few weeks ago he was having a lot of tingling in the left arm down the left side.  He said this started about 4 months ago.  He reports it is a fleeting issue and goes away on its own.  He actually reports that since that time he made the appointment the symptoms have gotten much better.  He reports an average pain which is more tingling as a 4 out of 10.  Right now is not having any issues over the last several days.  It is better at rest.  It really does not affect his daily activities although when it really flared up it seems to be aggravated by some activities.  He has not noted any focal weakness or associated headaches or blurry vision etc.  No balance difficulties.  No new injuries.  No trauma.  He does have a history of poorly controlled diabetes.  He is followed closely by Dr. Sander Radon his primary care physician.  He has been seen there from a standpoint of his left-sided arm tingling and has really been worked up from a cardiac standpoint.  He has history of peripheral vascular disease.  Review of Systems  Constitutional: Negative for chills, fever, malaise/fatigue and weight loss.  HENT: Negative for  hearing loss and sinus pain.   Eyes: Negative for blurred vision, double vision and photophobia.  Respiratory: Negative for cough and shortness of breath.   Cardiovascular: Negative for chest pain, palpitations and leg swelling.  Gastrointestinal: Negative for abdominal pain, nausea and vomiting.  Genitourinary: Negative for flank pain.  Musculoskeletal: Positive for joint pain and neck pain. Negative for myalgias.  Skin: Negative for itching and rash.  Neurological: Positive for tingling. Negative for tremors, focal weakness and weakness.  Endo/Heme/Allergies: Negative.   Psychiatric/Behavioral: Negative for depression.  All other systems reviewed and are negative.  Otherwise per HPI.  Assessment & Plan: Visit Diagnoses:  1. Cervicalgia   2. Chronic left shoulder pain   3. Cervical radiculopathy   4. Paresthesia of skin     Plan: Findings:  Interesting history of chronic intermittent neck and shoulder pain with some tingling in the arm which is somewhat nondermatomal.  MRI of the cervical spine has been completed and reviewed again.  He has multilevel arthritic changes with small amount of listhesis, multilevel left-sided foraminal narrowing but no high-grade central stenosis or cord compression.  He also gets some level of myofascial pain with positive trigger points that could also give him some of this symptom.  He also has some intrinsic shoulder issues and prior injection in the left shoulder was beneficial.  Luckily at  this point he is actually gotten relief on his own over the last few weeks that seems to really subsided quite a bit.  He has had electrodiagnostic study which was normal.  At this point we just offered that activity modification and continued watchful waiting and continued strengthening is the best course of action.  If it seemed to be more radicular in nature would repeat the cervical epidural injection.  Patient would not represent a really good surgical candidate he  does have multilevel findings.  At this point he will continue with his own current care and will follow him accordingly.    Meds & Orders: No orders of the defined types were placed in this encounter.  No orders of the defined types were placed in this encounter.   Follow-up: Return if symptoms worsen or fail to improve.   Procedures: No procedures performed  No notes on file   Clinical History: MRI CERVICAL SPINE WITHOUT CONTRAST  TECHNIQUE: Multiplanar, multisequence MR imaging of the cervical spine was performed. No intravenous contrast was administered.  COMPARISON:  Outside exam 01/04/2011  FINDINGS: Alignment: Straightening of the normal cervical lordosis. One or 2 mm anterolisthesis C3-4. No significant curvature.  Vertebrae: Chronic discogenic endplate marrow changes C3-4 through C6-7.  Cord: No primary cord lesion.  See below for degenerative changes.  Posterior Fossa, vertebral arteries, paraspinal tissues: Negative. Insignificant posterior nasopharyngeal Thornwaldt cyst.  Disc levels:  Foramen magnum and C1-2 are normal.  C2-3: No disc pathology. Mild facet osteoarthritis but no stenosis or edema.  C3-4: Facet arthropathy on the left allowing 1 or 2 mm of anterolisthesis. Mild bulging of the disc. Foraminal stenosis on the left that could affect the C4 nerve. Foramen on the right widely patent.  C4-5: Mild disc bulge. Facet arthropathy on the left. No central canal stenosis. Foraminal narrowing on the left that could affect the C5 nerve.  C5-6: Bulging of the disc more towards the left. Facet arthropathy on the left. No compressive central canal stenosis. Foraminal stenosis on the left that could compress the C6 nerve.  C6-7: Spondylosis with endplate osteophytes and protruding disc material slightly more prominent towards the right. Effacement of the ventral subarachnoid space but no compression of the cord. Bilateral foraminal  stenosis that could affect either or both C7 nerves.  C7-T1:  Normal interspace.  IMPRESSION: In this patient with right-sided symptoms, the most concerning findings are at the C6-7 level were there is chronic spondylosis with endplate osteophytes and protruding disc material. Narrowing of the ventral subarachnoid space but no compression of the cord. Bilateral foraminal stenosis could compress either or both C7 nerves.  Left-sided predominant spondylosis and facet arthropathy at C3-4, C4-5 and C5-6 with left-sided foraminal narrowing at those levels that could possibly affect the C4, C5 and C6 nerves on the left. I do not see any right foraminal disease of significance in that region.  Compared to the outside exam, the findings appear similar, possibly minimally progressive over time.   Electronically Signed   By: Paulina FusiMark  Shogry M.D.   On: 07/28/2017 08:18   He reports that he has been smoking cigarettes. He has a 50.00 pack-year smoking history. He has never used smokeless tobacco.  Recent Labs    10/23/17 1659 12/28/17 1204 05/23/18 0741  HGBA1C 7.5 6.9* 9.3*    Objective:  VS:  HT:5\' 10"  (177.8 cm)   WT:220 lb (99.8 kg)  BMI:31.57    BP:(!) 145/74  HR:69bpm  TEMP: ( )  RESP:  Physical Exam  Constitutional: He is oriented to person, place, and time. He appears well-developed and well-nourished. No distress.  HENT:  Head: Normocephalic and atraumatic.  Eyes: Pupils are equal, round, and reactive to light. Conjunctivae are normal.  Neck: Neck supple. No JVD present. No tracheal deviation present.  Cardiovascular: Regular rhythm and intact distal pulses.  Pulmonary/Chest: Effort normal. No respiratory distress.  Musculoskeletal: He exhibits no edema.  Patient sits with forward flexed cervical spine.  Does have pain at end ranges of left rotation more than right.  Has a negative Spurling's test bilaterally.  Negative Hoffmann's test bilaterally.  Shoulder  impingement on the left more than right but minimal.  He has positive trigger points in the trapezius and levator scapula of the reproduce some of his pain.  He has good strength in the upper extremities bilaterally.  Good wrist extension long finger flexion and abduction.  Neurological: He is alert and oriented to person, place, and time.  Skin: Skin is warm and dry. No rash noted. No erythema.  Psychiatric: He has a normal mood and affect.  Nursing note and vitals reviewed.   Ortho Exam Imaging: No results found.  Past Medical/Family/Surgical/Social History: Medications & Allergies reviewed per EMR, new medications updated. Patient Active Problem List   Diagnosis Date Noted  . PAD (peripheral artery disease) (HCC) 12/11/2017  . Preoperative cardiovascular examination 12/05/2017  . Peripheral arterial disease (HCC) 10/23/2017  . History of MRSA infection 10/23/2017  . Intermittent claudication (HCC) 10/17/2017  . Herniated cervical intervertebral disc 08/01/2017  . Osteoarthritis of spine 08/01/2017  . Poorly controlled type 2 diabetes mellitus with circulatory disorder (HCC) 07/11/2017  . Polyarthralgia 06/27/2017  . Neck pain 06/27/2017  . Primary osteoarthritis of both knees 05/02/2017  . Sensory neuronopathy 01/14/2017  . Transaminasemia 10/10/2016  . Vitamin B 12 deficiency 09/19/2016  . Hyperglycemia 09/19/2016  . Chest pressure 07/04/2012  . Cough 07/04/2012  . Hyperlipidemia 10/12/2010  . ANXIETY 10/12/2010  . History of alcohol abuse 10/12/2010  . TOBACCO USE 10/12/2010  . DEPRESSION 10/12/2010  . Essential hypertension, benign 10/12/2010  . GERD 10/12/2010  . Osteoarthritis 10/12/2010  . Sleep apnea 10/12/2010   Past Medical History:  Diagnosis Date  . Anxiety   . Depression   . Diabetes mellitus without complication (HCC)   . GERD (gastroesophageal reflux disease)   . Headache(784.0)   . Hyperlipidemia   . Hypertension   . MRSA infection    Left shin,  back  . Nerve damage    BLE  . Osteoarthritis   . Peripheral vascular disease (HCC)   . Pneumonia    Family History  Problem Relation Age of Onset  . Heart attack Father 60  . Hypertension Father   . Arthritis Mother   . Asthma Maternal Grandfather   . Stroke Paternal Grandfather   . Heart disease Other   . Mental illness Other   . Cancer Neg Hx   . Diabetes Neg Hx   . Drug abuse Neg Hx   . Early death Neg Hx   . Hearing loss Neg Hx   . Hyperlipidemia Neg Hx    Past Surgical History:  Procedure Laterality Date  . ABDOMINAL AORTOGRAM W/LOWER EXTREMITY N/A 12/01/2017   Procedure: ABDOMINAL AORTOGRAM W/LOWER EXTREMITY;  Surgeon: Sherren Kerns, MD;  Location: [facility] INVASIVE CV LAB;  Service: Cardiovascular;  Laterality: N/A;  . FEMORAL-POPLITEAL BYPASS GRAFT Right 12/11/2017   Procedure: BYPASS GRAFT RIGHT FEMORAL-ABOVE KNEE POPLITEAL ARTERY with Non Reversed  Greater Saphenous Vein and Angioplasty distal vein Graft.;  Surgeon: Sherren Kerns, MD;  Location: [facility] OR;  Service: Vascular;  Laterality: Right;  . PERIPHERAL VASCULAR BALLOON ANGIOPLASTY Right 12/01/2017   Procedure: PERIPHERAL VASCULAR BALLOON ANGIOPLASTY;  Surgeon: Sherren Kerns, MD;  Location: [facility] INVASIVE CV LAB;  Service: Cardiovascular;  Laterality: Right;  superficial femoral, attempted  . PERIPHERAL VASCULAR INTERVENTION Left 12/01/2017   Procedure: PERIPHERAL VASCULAR INTERVENTION;  Surgeon: Sherren Kerns, MD;  Location: [facility] INVASIVE CV LAB;  Service: Cardiovascular;  Laterality: Left;  External iliac  . ROTATOR CUFF REPAIR    . TONSILLECTOMY    . VEIN HARVEST Right 12/11/2017   Procedure: RIGHT GREATER SAPHENOUS VEIN HARVEST;  Surgeon: Sherren Kerns, MD;  Location: [facility] OR;  Service: Vascular;  Laterality: Right;   Social History   Occupational History  . Not on file  Tobacco Use  . Smoking status: Current Every Day Smoker    Packs/day: 2.00    Years: 25.00    Pack years: 50.00    Types: Cigarettes  .  Smokeless tobacco: Never Used  . Tobacco comment: 30 cigarettes per day  Substance and Sexual Activity  . Alcohol use: Not Currently    Alcohol/week: 20.0 standard drinks    Types: 20 Glasses of wine per week    Comment: not since Jan 2018  . Drug use: No  . Sexual activity: Not Currently

## 2018-08-15 ENCOUNTER — Other Ambulatory Visit: Payer: Self-pay | Admitting: Family Medicine

## 2018-08-30 ENCOUNTER — Other Ambulatory Visit: Payer: Self-pay | Admitting: Sports Medicine

## 2018-10-04 ENCOUNTER — Encounter: Payer: Self-pay | Admitting: Sports Medicine

## 2018-10-04 ENCOUNTER — Ambulatory Visit (INDEPENDENT_AMBULATORY_CARE_PROVIDER_SITE_OTHER): Payer: BLUE CROSS/BLUE SHIELD

## 2018-10-04 ENCOUNTER — Ambulatory Visit: Payer: BLUE CROSS/BLUE SHIELD | Admitting: Sports Medicine

## 2018-10-04 ENCOUNTER — Ambulatory Visit: Payer: Self-pay

## 2018-10-04 VITALS — BP 130/76 | HR 77 | Ht 70.0 in | Wt 217.6 lb

## 2018-10-04 DIAGNOSIS — I739 Peripheral vascular disease, unspecified: Secondary | ICD-10-CM

## 2018-10-04 DIAGNOSIS — M25551 Pain in right hip: Secondary | ICD-10-CM

## 2018-10-04 DIAGNOSIS — M170 Bilateral primary osteoarthritis of knee: Secondary | ICD-10-CM

## 2018-10-04 DIAGNOSIS — K4190 Unilateral femoral hernia, without obstruction or gangrene, not specified as recurrent: Secondary | ICD-10-CM

## 2018-10-04 DIAGNOSIS — M1611 Unilateral primary osteoarthritis, right hip: Secondary | ICD-10-CM | POA: Diagnosis not present

## 2018-10-04 DIAGNOSIS — M160 Bilateral primary osteoarthritis of hip: Secondary | ICD-10-CM | POA: Diagnosis not present

## 2018-10-04 MED ORDER — DULOXETINE HCL 30 MG PO CPEP: ORAL_CAPSULE | ORAL | 3 refills | Status: DC

## 2018-10-04 NOTE — Procedures (Signed)
PROCEDURE NOTE:  Ultrasound Guided: Injection: Right hip Images were obtained and interpreted by myself, Gaspar BiddingMichael Ruth Kovich, DO  Images have been saved and stored to PACS system. Images obtained on: GE S7 Ultrasound machine    ULTRASOUND FINDINGS:  Osteophytic spurring with a small supraphysiologic effusion.  He does have a small femoral hernia with no active peristalsis appreciated  DESCRIPTION OF PROCEDURE:  The patient's clinical condition is marked by substantial pain and/or significant functional disability. Other conservative therapy has not provided relief, is contraindicated, or not appropriate. There is a reasonable likelihood that injection will significantly improve the patient's pain and/or functional impairment.   After discussing the risks, benefits and expected outcomes of the injection and all questions were reviewed and answered, the patient wished to undergo the above named procedure.  Verbal consent was obtained.  The ultrasound was used to identify the target structure and adjacent neurovascular structures. The skin was then prepped in sterile fashion and the target structure was injected under direct visualization using sterile technique as below:  Single injection performed as below: PREP: Alcohol and Ethel Chloride APPROACH:direct, stopcock technique, 22g 3.5 in. INJECTATE: 5 cc 1% lidocaine, 2 cc 0.5% Marcaine and 2 cc 40mg /mL DepoMedrol ASPIRATE: None DRESSING: Band-Aid  Post procedural instructions including recommending icing and warning signs for infection were reviewed.    This procedure was well tolerated and there were no complications.   IMPRESSION: Succesful Ultrasound Guided: Injection

## 2018-10-04 NOTE — Patient Instructions (Addendum)

## 2018-10-04 NOTE — Progress Notes (Signed)
Jason [name]. [name] [phone]  Jason [name] - 53 y.o. male MRN [id number]  Date of birth: 1965-12-24  Visit Date: October 04, 2018  PCP: [name], MD   Referred by: [name], MD  SUBJECTIVE:  Chief Complaint  Patient presents with  . Right Hip - Initial Assessment  . Right Knee - Initial Assessment  . Follow-up    R hip and R knee pain.  B knee XR - 2018.  Gabapentin, Celebrex, Cymbalta    HPI: Patient presents for 2 months of worsening right hip and knee pain.  There is no eliciting injury with sitting to standing.  He reports mechanical symptoms catching popping and grinding.  Pain radiates from the right hip to the groin and from the right hip towards the right knee as well as in a retrograde fashion.  This is worsened with prolonged walking with right hip abduction and external rotation such as while putting his shoes on.  He has been using a Body Helix Compression Sleeve for his right knee but this does not seem to be helping.  Is made worse by laying directly on his right side.  He has undergone a right femoropopliteal bypass and this is been doing well the claudication-like symptoms that he was having have resolved.  He has noticed that applying pressure across the anterior aspect of his groin does seem to help with some of the pain and discomfort that he has with walking.  REVIEW OF SYSTEMS: Denies fevers, chills, recent weight gain or weight loss.  No night sweats. No significant nighttime awakenings due to this issue. Pt denies any change in bowel or bladder habits, muscle weakness, numbness or falls associated with this pain. He does report some associated nighttime disturbances as well as bilateral lower extremity swelling and edema that is mild.  Otherwise per HPI  HISTORY:  Prior history reviewed and updated per electronic medical record.  Patient Active Problem List   Diagnosis Date Noted  . PAD (peripheral artery disease) (HCC) 12/11/2017  . Preoperative cardiovascular examination 12/05/2017  . Peripheral arterial disease (HCC) 10/23/2017  . History of MRSA infection 10/23/2017  . Intermittent claudication (HCC) 10/17/2017  . Herniated cervical intervertebral disc 08/01/2017    MRI 07/27/2017: Multilevel degenerative changes most notably at C5-6 with endplate osteophytosis and protruding disc material at C5-6 on the right and C6-7 on the left correlating well with his symptoms.  No evidence of cervical myelopathy.   . Osteoarthritis of spine 08/01/2017  . Poorly controlled type 2 diabetes mellitus with circulatory disorder (HCC) 07/11/2017  . Polyarthralgia 06/27/2017  . Neck pain 06/27/2017  . Primary osteoarthritis of both knees 05/02/2017  . Sensory neuronopathy 01/14/2017  . Transaminasemia 10/10/2016  . Vitamin B 12 deficiency 09/19/2016  . Hyperglycemia 09/19/2016  . Chest pressure 07/04/2012  . Cough 07/04/2012  . Hyperlipidemia 10/12/2010  . ANXIETY 10/12/2010    Qualifier: Diagnosis of  By: Yetta Barre MD, Bernadene Bell.    . History of alcohol abuse 10/12/2010  . TOBACCO USE 10/12/2010  . DEPRESSION 10/12/2010  . Essential hypertension, benign 10/12/2010  . GERD 10/12/2010    Qualifier: Diagnosis of  By: Yetta Barre MD, Bernadene Bell.    . Osteoarthritis 10/12/2010  . Sleep apnea 10/12/2010    Qualifier: Diagnosis of  By: Yetta Barre MD, Bernadene Bell.     Social History   Occupational History  . Not on file  Tobacco Use  .  Smoking status: Current Every Day Smoker    Packs/day: 2.00    Years: 25.00    Pack years: 50.00    Types: Cigarettes  . Smokeless tobacco: Never Used  . Tobacco comment: 30 cigarettes per day  Substance and Sexual Activity  . Alcohol use: Not Currently    Alcohol/week: 20.0 standard drinks    Types: 20 Glasses of wine per week    Comment: not since Jan 2018  . Drug use: No  . Sexual activity: Not Currently   Social  History   Social History Narrative  . Not on file    OBJECTIVE:  VS:  HT:5\' 10"  (177.8 cm)   WT:217 lb 9.6 oz (98.7 kg)  BMI:31.22    BP:130/76  HR:77bpm  TEMP: ( )  RESP:95 %   PHYSICAL EXAM: Adult male.  No acute distress.  Alert and appropriate. He has pain with logroll, FADIR and Stinchfield on the right.  There is a mechanical click with axial load and circumduction of the hip.  He has knee has full flexion and extension.  He has well-healed surgical incisions that did have some evidence of dehiscence given the large size of the wound.  He reports that this has finally fully healed even though he did bust some of his stitches.  He has no significant pain with direct palpation over the femoral canal there is a moderate amount of fibrosis in this area but no reproducible or significant hernia felt   ASSESSMENT:   1. Right hip pain   2. Primary osteoarthritis of right hip   3. Femoral hernia of right side   4. Primary osteoarthritis of both knees   5. PAD (peripheral artery disease) (HCC)     PROCEDURES:  US Guided Injection per procedure note      PLAN:  Pertinent additional documentation may be included in corresponding procedure notes, imaging studies, problem based documentation and patient instructions.  No problem-specific Assessment & Plan notes found for this encounter.   He does have right-sided hip osteoarthritis and should respond well to an intra-articular injection today.  A small femoral hernia was appreciated on [facility] ultrasound which is not surprising given the surgery that he had.  There is no evidence of incarceration and no significant pain associated with this but it does make sense that if applying pressure over the right anterior groin seems to help but that this may be partially attributed to a femoral hernia.  If he has incomplete resolution of his symptoms CT scan of the pelvis could be considered.  Continue previously prescribed home exercise  program.   Activity modifications and the importance of avoiding exacerbating activities (limiting pain to no more than a 4 / 10 during or following activity) recommended and discussed.  Discussed red flag symptoms that warrant earlier emergent evaluation and patient voices understanding.   Meds ordered this encounter  Medications  . DULoxetine (CYMBALTA) 30 MG capsule    Sig: TAKE 1 CAPSULE BY MOUTH EVERY DAY - office visit before next refill    Dispense:  90 capsule    Refill:  3   Lab Orders  No laboratory test(s) ordered today    Imaging Orders     DG HIP UNILAT W OR W/O PELVIS 2-3 VIEWS RIGHT     [place] [facility] POCT ULTRASOUND Referral Orders  No referral(s) requested today    Return in about 6 weeks (around 11/15/2018).          Andrena Mews, DO  [facility] Physician

## 2018-10-30 ENCOUNTER — Other Ambulatory Visit: Payer: Self-pay | Admitting: Family Medicine

## 2018-11-14 ENCOUNTER — Telehealth (INDEPENDENT_AMBULATORY_CARE_PROVIDER_SITE_OTHER): Payer: Self-pay | Admitting: Physical Medicine and Rehabilitation

## 2018-11-14 NOTE — Telephone Encounter (Signed)
Cervical esi ok, sooner than later I guess but he is at some risk with diabetes and above 50. If any fever or cough, let me know

## 2018-11-14 NOTE — Telephone Encounter (Signed)
Spoke with Jason [name] and she states no pa is needed for [id number] in outpatient setting. Reference# [id number]

## 2018-11-14 NOTE — Telephone Encounter (Signed)
Is auth needed for [id number]? Patient is scheduled for tomorrow at 1000.

## 2018-11-15 ENCOUNTER — Ambulatory Visit (INDEPENDENT_AMBULATORY_CARE_PROVIDER_SITE_OTHER): Payer: Self-pay

## 2018-11-15 ENCOUNTER — Ambulatory Visit (INDEPENDENT_AMBULATORY_CARE_PROVIDER_SITE_OTHER): Payer: BLUE CROSS/BLUE SHIELD | Admitting: Physical Medicine and Rehabilitation

## 2018-11-15 ENCOUNTER — Encounter (INDEPENDENT_AMBULATORY_CARE_PROVIDER_SITE_OTHER): Payer: Self-pay | Admitting: Physical Medicine and Rehabilitation

## 2018-11-15 ENCOUNTER — Encounter: Payer: Self-pay | Admitting: Sports Medicine

## 2018-11-15 ENCOUNTER — Ambulatory Visit: Payer: BLUE CROSS/BLUE SHIELD | Admitting: Sports Medicine

## 2018-11-15 ENCOUNTER — Other Ambulatory Visit: Payer: Self-pay

## 2018-11-15 VITALS — BP 150/82 | HR 74 | Ht 70.0 in | Wt 217.8 lb

## 2018-11-15 VITALS — BP 145/81 | HR 76 | Temp 98.4°F

## 2018-11-15 DIAGNOSIS — M542 Cervicalgia: Secondary | ICD-10-CM

## 2018-11-15 DIAGNOSIS — M25512 Pain in left shoulder: Secondary | ICD-10-CM | POA: Diagnosis not present

## 2018-11-15 DIAGNOSIS — R202 Paresthesia of skin: Secondary | ICD-10-CM

## 2018-11-15 DIAGNOSIS — M4722 Other spondylosis with radiculopathy, cervical region: Secondary | ICD-10-CM

## 2018-11-15 DIAGNOSIS — E1165 Type 2 diabetes mellitus with hyperglycemia: Secondary | ICD-10-CM

## 2018-11-15 DIAGNOSIS — M5412 Radiculopathy, cervical region: Secondary | ICD-10-CM

## 2018-11-15 DIAGNOSIS — M170 Bilateral primary osteoarthritis of knee: Secondary | ICD-10-CM | POA: Diagnosis not present

## 2018-11-15 DIAGNOSIS — M1611 Unilateral primary osteoarthritis, right hip: Secondary | ICD-10-CM | POA: Diagnosis not present

## 2018-11-15 DIAGNOSIS — G8929 Other chronic pain: Secondary | ICD-10-CM

## 2018-11-15 MED ORDER — DULOXETINE HCL 60 MG PO CPEP: 60.0000 mg | ORAL_CAPSULE | Freq: Every day | ORAL | 3 refills | Status: DC

## 2018-11-15 MED ORDER — METHYLPREDNISOLONE ACETATE 80 MG/ML IJ SUSP
80.0000 mg | Freq: Once | INTRAMUSCULAR | Status: AC
  Administered 2018-11-15: 80 mg

## 2018-11-15 MED ORDER — GABAPENTIN 300 MG PO CAPS: 600.0000 mg | ORAL_CAPSULE | Freq: Two times a day (BID) | ORAL | 1 refills | Status: DC

## 2018-11-15 NOTE — Procedures (Signed)
Cervical Epidural Steroid Injection - Interlaminar Approach with Fluoroscopic Guidance  Patient: Jason [name]      Date of Birth: 08/19/1966 MRN: [id number] PCP: [name], MD      Visit Date: 11/15/2018   Universal Protocol:    Date/Time: 11/14/2009:02 AM  Consent Given By: the patient  Position: PRONE  Additional Comments: Vital signs were monitored before and after the procedure. Patient was prepped and draped in the usual sterile fashion. The correct patient, procedure, and site was verified.   Injection Procedure Details:  Procedure Site One Meds Administered:  Meds ordered this encounter  Medications  . methylPREDNISolone acetate (DEPO-MEDROL) injection 80 mg     Laterality: Left  Location/Site: C7-T1  Needle size: 20 G  Needle type: Touhy  Needle Placement: Paramedian epidural space  Findings:  -Comments: Excellent flow of contrast into the epidural space.  Procedure Details: Using a paramedian approach from the side mentioned above, the region overlying the inferior lamina was localized under fluoroscopic visualization and the soft tissues overlying this structure were infiltrated with 4 ml. of 1% Lidocaine without Epinephrine. A # 20 gauge, Tuohy needle was inserted into the epidural space using a paramedian approach.  The epidural space was localized using loss of resistance along with lateral and contralateral oblique bi-planar fluoroscopic views.  After negative aspirate for air, blood, and CSF, a 2 ml. volume of Isovue-250 was injected into the epidural space and the flow of contrast was observed. Radiographs were obtained for documentation purposes.   The injectate was administered into the level noted above.  Additional Comments:  The patient tolerated the procedure well Dressing: 2 x 2 sterile gauze and Band-Aid    Post-procedure details: Patient was observed during the procedure. Post-procedure instructions were reviewed.  Patient  left the clinic in stable condition.

## 2018-11-15 NOTE — Progress Notes (Signed)
[name]. [name] at [facility] [phone]  Jason [name] - 53 y.o. male MRN [id number]  Date of birth: [birth year]  Visit Date: November 15, 2018   PCP: [name], MD    Referred by: [name], MD  SUBJECTIVE:  Chief Complaint  Patient presents with   Right Hip - Follow-up   Right Knee - Follow-up   Left Knee - Follow-up    HPI: Patient presents for follow-up of all the.  He is 6 weeks status post right hip intra-articular injection and is done significantly better but does occasionally have some catching in his hip as well as his right knee.  Occasionally his knee has become swollen over the past 6 weeks but this will typically resolve within 1 to 2 days.  He denies any locking or giving way but does have catching.  He does note that his peripheral neuropathy symptoms have seemingly worsened since being on his feet more for jobs that he is working.  He additionally has worsening left arm paresthesia and is can be seeing Dr. Alvester Morin later today for epidural steroid injection if this is available.    REVIEW OF SYSTEMS: No significant nighttime awakenings due to this issue. Denies fevers, chills, recent weight gain or weight loss.  No night sweats.  Pt denies any change in bowel or bladder habits, muscle weakness, numbness or falls associated with this pain.  HISTORY:  Prior history reviewed and updated per electronic medical record.  Patient Active Problem List   Diagnosis Date Noted   PAD (peripheral artery disease) (HCC) 12/11/2017   Preoperative cardiovascular examination 12/05/2017   Peripheral arterial disease (HCC) 10/23/2017   History of MRSA infection 10/23/2017   Intermittent claudication (HCC) 10/17/2017   Herniated cervical intervertebral disc 08/01/2017    MRI 07/27/2017: Multilevel degenerative changes most notably at C5-6 with endplate osteophytosis and protruding disc material at  C5-6 on the right and C6-7 on the left correlating well with his symptoms.  No evidence of cervical myelopathy.    Osteoarthritis of spine 08/01/2017   Poorly controlled type 2 diabetes mellitus with circulatory disorder (HCC) 07/11/2017   Polyarthralgia 06/27/2017   Neck pain 06/27/2017   Primary osteoarthritis of both knees 05/02/2017   Sensory neuronopathy 01/14/2017   Transaminasemia 10/10/2016   Vitamin B 12 deficiency 09/19/2016   Hyperglycemia 09/19/2016   Chest pressure 07/04/2012   Cough 07/04/2012   Hyperlipidemia 10/12/2010   ANXIETY 10/12/2010    Qualifier: Diagnosis of  By: Yetta Barre MD, Bernadene Bell.     History of alcohol abuse 10/12/2010   TOBACCO USE 10/12/2010   DEPRESSION 10/12/2010   Essential hypertension, benign 10/12/2010   GERD 10/12/2010    Qualifier: Diagnosis of  By: Yetta Barre MD, Bernadene Bell.     Osteoarthritis 10/12/2010   Sleep apnea 10/12/2010    Qualifier: Diagnosis of  By: Yetta Barre MD, Bernadene Bell.     Social History   Occupational History   Not on file  Tobacco Use   Smoking status: Current Every Day Smoker    Packs/day: 2.00    Years: 25.00    Pack years: 50.00    Types: Cigarettes   Smokeless tobacco: Never Used   Tobacco comment: 30 cigarettes per day  Substance and Sexual Activity   Alcohol use: Not Currently    Alcohol/week: 20.0 standard drinks    Types: 20 Glasses of wine per week    Comment: not since Jan  2018   Drug use: No   Sexual activity: Not Currently   Social History   Social History Narrative   Not on file     OBJECTIVE:  VS:  HT:5\' 10"  (177.8 cm)    WT:217 lb 12.8 oz (98.8 kg)   BMI:31.25     BP: (!) 150/82   HR:74bpm   TEMP: ( )   RESP:96 %   PHYSICAL EXAM: Adult male. No acute distress.  Alert and appropriate. Bilateral lower extremities are overall well aligned.  He has good internal and external rotation of his right hip with some pain but it is markedly reduced compared to the left.  He  has a hard time crossing his right leg over his left in a seated position.  His right knee has a small supraphysiologic effusion but this is minimal.  No medial or lateral joint line pain.  Ligamentously stable.   ASSESSMENT:  1. Primary osteoarthritis of right hip   2. Primary osteoarthritis of both knees   3. Paresthesia of left arm   4. Type 2 diabetes mellitus with hyperglycemia, without long-term current use of insulin (HCC)   5. Osteoarthritis of spine with radiculopathy, cervical region     PROCEDURES:  None  PLAN:  Pertinent additional documentation may be included in corresponding procedure notes, imaging studies, problem based documentation and patient instructions.  No problem-specific Assessment & Plan notes found for this encounter.  Overall he is doing better.  Medications are titrated today.  We can repeat injections at any time going forward pending the outcome of COVID-19.      Activity modifications and the importance of avoiding exacerbating activities (limiting pain to no more than a 4 / 10 during or following activity) recommended and discussed.   Discussed red flag symptoms that warrant earlier emergent evaluation and patient voices understanding.    Meds ordered this encounter  Medications   DULoxetine (CYMBALTA) 60 MG capsule    Sig: Take 1 capsule (60 mg total) by mouth daily.    Dispense:  90 capsule    Refill:  3   gabapentin (NEURONTIN) 300 MG capsule    Sig: Take 2 capsules (600 mg total) by mouth 2 (two) times daily.    Dispense:  360 capsule    Refill:  1   Lab Orders  No laboratory test(s) ordered today   Imaging Orders  No imaging studies ordered today   Referral Orders  No referral(s) requested today      Return if symptoms worsen or fail to improve.          Andrena Mews, DO    [facility]

## 2018-11-15 NOTE — Progress Notes (Signed)
Numeric Pain Rating Scale and Functional Assessment Average Pain 9   In the last MONTH (on 0-10 scale) has pain interfered with the following?  1. General activity like being  able to carry out your everyday physical activities such as walking, climbing stairs, carrying groceries, or moving a chair?  Rating(6)    +Driver, -BT, -Dye Allergies. 

## 2018-11-15 NOTE — Progress Notes (Signed)
Jason [name] - 53 y.o. male MRN [id number]  Date of birth: 1966/06/09  Office Visit Note: Visit Date: 11/15/2018 PCP: [name], MD Referred by: [name], MD  Subjective: Chief Complaint  Patient presents with  . Neck - Pain  . Left Shoulder - Pain  . Left Arm - Pain   HPI: Jason [name] is a 53 y.o. male who comes in today For reevaluation of his neck and upper back pain referring into the left shoulder and hand with paresthesia.  By way of history patient is diabetic with peripheral artery disease and since I seen him last he is s/p Right femoral-above knee popliteal bypass graft with reversed saphenous vein on 12/11/17 by Dr. Darrick Penna.  He is also seeing Dr. Berline Chough recently for hip pain and knee problems.  He comes in today with worsening neck and referral pain to the shoulder and arm with paresthesia.  He had a similar episode in 2018 that was relieved with cervical epidural injection.  MRI from that time does show disc herniation at C5-6 and C6-7 the C5-6 is more right-sided with C6-7 more left-sided but they are both paracentral and could cause irritation bilaterally.  There is no nerve or cord compression.  He is not had any new trauma but he reports working at a new job site where he is staring higher than he normally does because of the ceiling height.  He reports that over the last several weeks this is been progressively gotten worse.  He is tried his typical medication management which is really not working at this point.  His pain is rated 9 out of 10.  He has had no focal weakness or other red flag symptoms such as bowel bladder difficulties or balance difficulties.  He has had prior electrodiagnostic studies as well and those are in the chart.  Review of Systems  Constitutional: Negative for chills, fever, malaise/fatigue and weight loss.  HENT: Negative for hearing loss and sinus pain.   Eyes: Negative for blurred vision, double vision and photophobia.   Respiratory: Negative for cough and shortness of breath.   Cardiovascular: Negative for chest pain, palpitations and leg swelling.  Gastrointestinal: Negative for abdominal pain, nausea and vomiting.  Genitourinary: Negative for flank pain.  Musculoskeletal: Positive for joint pain and neck pain. Negative for myalgias.  Skin: Negative for itching and rash.  Neurological: Positive for tingling. Negative for tremors, focal weakness and weakness.  Endo/Heme/Allergies: Negative.   Psychiatric/Behavioral: Negative for depression.  All other systems reviewed and are negative.  Otherwise per HPI.  Assessment & Plan: Visit Diagnoses:  1. Cervical radiculopathy   2. Cervicalgia   3. Chronic left shoulder pain   4. Paresthesia of skin     Plan: Findings:  Chronic history of neck and shoulder pain with paresthesia consistent with cervical radiculitis radiculopathy.  He also has some level of shoulder impingement.  He has some level of myofascial pain.  He is reported worsening severe symptoms however he was doing well up until just recently.  No specific trauma.  I do think it is reasonable to provide diagnostic medically therapeutic C7-T1 interlaminar epidural steroid injection as it is helped in the past.  If it does not give him much relief we may look at updated imaging versus specific treatment to the musculature of this area.  He can also follow-up with Dr. Gaspar Bidding concerning the shoulder itself.  He did well last time with the injection so we will  try that first.  We discussed the risk with his diabetes and peripheral vascular disease.  He is not on anticoagulation other than aspirin.    Meds & Orders:  Meds ordered this encounter  Medications  . methylPREDNISolone acetate (DEPO-MEDROL) injection 80 mg    Orders Placed This Encounter  Procedures  . XR C-ARM NO REPORT  . Epidural Steroid injection    Follow-up: Return if symptoms worsen or fail to improve.   Procedures: No  procedures performed  Cervical Epidural Steroid Injection - Interlaminar Approach with Fluoroscopic Guidance  Patient: Jason [name]      Date of Birth: 07-Jul-1966 MRN: [id number] PCP: [name], MD      Visit Date: 11/15/2018   Universal Protocol:    Date/Time: 11/14/2009:02 AM  Consent Given By: the patient  Position: PRONE  Additional Comments: Vital signs were monitored before and after the procedure. Patient was prepped and draped in the usual sterile fashion. The correct patient, procedure, and site was verified.   Injection Procedure Details:  Procedure Site One Meds Administered:  Meds ordered this encounter  Medications  . methylPREDNISolone acetate (DEPO-MEDROL) injection 80 mg     Laterality: Left  Location/Site: C7-T1  Needle size: 20 G  Needle type: Touhy  Needle Placement: Paramedian epidural space  Findings:  -Comments: Excellent flow of contrast into the epidural space.  Procedure Details: Using a paramedian approach from the side mentioned above, the region overlying the inferior lamina was localized under fluoroscopic visualization and the soft tissues overlying this structure were infiltrated with 4 ml. of 1% Lidocaine without Epinephrine. A # 20 gauge, Tuohy needle was inserted into the epidural space using a paramedian approach.  The epidural space was localized using loss of resistance along with lateral and contralateral oblique bi-planar fluoroscopic views.  After negative aspirate for air, blood, and CSF, a 2 ml. volume of Isovue-250 was injected into the epidural space and the flow of contrast was observed. Radiographs were obtained for documentation purposes.   The injectate was administered into the level noted above.  Additional Comments:  The patient tolerated the procedure well Dressing: 2 x 2 sterile gauze and Band-Aid    Post-procedure details: Patient was observed during the procedure. Post-procedure instructions  were reviewed.  Patient left the clinic in stable condition.   Clinical History: MRI CERVICAL SPINE WITHOUT CONTRAST  TECHNIQUE: Multiplanar, multisequence MR imaging of the cervical spine was performed. No intravenous contrast was administered.  COMPARISON:  Outside exam 01/04/2011  FINDINGS: Alignment: Straightening of the normal cervical lordosis. One or 2 mm anterolisthesis C3-4. No significant curvature.  Vertebrae: Chronic discogenic endplate marrow changes C3-4 through C6-7.  Cord: No primary cord lesion.  See below for degenerative changes.  Posterior Fossa, vertebral arteries, paraspinal tissues: Negative. Insignificant posterior nasopharyngeal Thornwaldt cyst.  Disc levels:  Foramen magnum and C1-2 are normal.  C2-3: No disc pathology. Mild facet osteoarthritis but no stenosis or edema.  C3-4: Facet arthropathy on the left allowing 1 or 2 mm of anterolisthesis. Mild bulging of the disc. Foraminal stenosis on the left that could affect the C4 nerve. Foramen on the right widely patent.  C4-5: Mild disc bulge. Facet arthropathy on the left. No central canal stenosis. Foraminal narrowing on the left that could affect the C5 nerve.  C5-6: Bulging of the disc more towards the left. Facet arthropathy on the left. No compressive central canal stenosis. Foraminal stenosis on the left that could compress the C6 nerve.  C6-7: Spondylosis  with endplate osteophytes and protruding disc material slightly more prominent towards the right. Effacement of the ventral subarachnoid space but no compression of the cord. Bilateral foraminal stenosis that could affect either or both C7 nerves.  C7-T1:  Normal interspace.  IMPRESSION: In this patient with right-sided symptoms, the most concerning findings are at the C6-7 level were there is chronic spondylosis with endplate osteophytes and protruding disc material. Narrowing of the ventral subarachnoid space  but no compression of the cord. Bilateral foraminal stenosis could compress either or both C7 nerves.  Left-sided predominant spondylosis and facet arthropathy at C3-4, C4-5 and C5-6 with left-sided foraminal narrowing at those levels that could possibly affect the C4, C5 and C6 nerves on the left. I do not see any right foraminal disease of significance in that region.  Compared to the outside exam, the findings appear similar, possibly minimally progressive over time.   Electronically Signed   By: Paulina Fusi M.D.   On: 07/28/2017 08:18   He reports that he has been smoking cigarettes. He has a 50.00 pack-year smoking history. He has never used smokeless tobacco.  Recent Labs    12/28/17 1204 05/23/18 0741  HGBA1C 6.9* 9.3*    Objective:  VS:  HT:    WT:   BMI:     BP:(!) 145/81  HR:76bpm  TEMP:98.4 F (36.9 C)(Oral)  RESP:96 % Physical Exam Vitals signs and nursing note reviewed.  Constitutional:      General: He is not in acute distress.    Appearance: Normal appearance. He is well-developed.  HENT:     Head: Normocephalic and atraumatic.  Eyes:     Conjunctiva/sclera: Conjunctivae normal.     Pupils: Pupils are equal, round, and reactive to light.  Neck:     Musculoskeletal: Normal range of motion and neck supple. No neck rigidity.  Cardiovascular:     Rate and Rhythm: Normal rate.     Pulses: Normal pulses.     Heart sounds: Normal heart sounds.  Pulmonary:     Effort: Pulmonary effort is normal. No respiratory distress.  Musculoskeletal:     Right lower leg: No edema.     Left lower leg: No edema.     Comments: Patient sits with forward flexed cervical spine he has decreased range of motion at end ranges of rotation left and right he has some pain with extension but a negative Spurling's test bilaterally.  He has pain through the trapezius with positive trigger point.  He has decreased range of motion of the left shoulder with external rotation this  does not reproduce his pain however.  He has good strength in the hands bilaterally with a negative Hoffmann's test.  Skin:    General: Skin is warm and dry.     Findings: No erythema or rash.  Neurological:     General: No focal deficit present.     Mental Status: He is alert and oriented to person, place, and time.     Sensory: No sensory deficit.     Coordination: Coordination normal.     Gait: Gait normal.  Psychiatric:        Mood and Affect: Mood normal.        Behavior: Behavior normal.     Ortho Exam Imaging: Xr C-arm No Report  Result Date: 11/15/2018 Please see Notes tab for imaging impression.   Past Medical/Family/Surgical/Social History: Medications & Allergies reviewed per EMR, new medications updated. Patient Active Problem List   Diagnosis Date  Noted  . PAD (peripheral artery disease) (HCC) 12/11/2017  . Preoperative cardiovascular examination 12/05/2017  . Peripheral arterial disease (HCC) 10/23/2017  . History of MRSA infection 10/23/2017  . Intermittent claudication (HCC) 10/17/2017  . Herniated cervical intervertebral disc 08/01/2017  . Osteoarthritis of spine 08/01/2017  . Poorly controlled type 2 diabetes mellitus with circulatory disorder (HCC) 07/11/2017  . Polyarthralgia 06/27/2017  . Neck pain 06/27/2017  . Primary osteoarthritis of both knees 05/02/2017  . Sensory neuronopathy 01/14/2017  . Transaminasemia 10/10/2016  . Vitamin B 12 deficiency 09/19/2016  . Hyperglycemia 09/19/2016  . Chest pressure 07/04/2012  . Cough 07/04/2012  . Hyperlipidemia 10/12/2010  . ANXIETY 10/12/2010  . History of alcohol abuse 10/12/2010  . TOBACCO USE 10/12/2010  . DEPRESSION 10/12/2010  . Essential hypertension, benign 10/12/2010  . GERD 10/12/2010  . Osteoarthritis 10/12/2010  . Sleep apnea 10/12/2010   Past Medical History:  Diagnosis Date  . Anxiety   . Depression   . Diabetes mellitus without complication (HCC)   . GERD (gastroesophageal reflux  disease)   . Headache(784.0)   . Hyperlipidemia   . Hypertension   . MRSA infection    Left shin, back  . Nerve damage    BLE  . Osteoarthritis   . Peripheral vascular disease (HCC)   . Pneumonia    Family History  Problem Relation Age of Onset  . Heart attack Father [id number]  . Hypertension Father   . Arthritis Mother   . Asthma Maternal Grandfather   . Stroke Paternal Grandfather   . Heart disease Other   . Mental illness Other   . Cancer Neg Hx   . Diabetes Neg Hx   . Drug abuse Neg Hx   . Early death Neg Hx   . Hearing loss Neg Hx   . Hyperlipidemia Neg Hx    Past Surgical History:  Procedure Laterality Date  . ABDOMINAL AORTOGRAM W/LOWER EXTREMITY N/A 12/01/2017   Procedure: ABDOMINAL AORTOGRAM W/LOWER EXTREMITY;  Surgeon: Sherren KernsFields, Charles E, MD;  Location: [facility];  Service: Cardiovascular;  Laterality: N/A;  . FEMORAL-POPLITEAL BYPASS GRAFT Right 12/11/2017   Procedure: BYPASS GRAFT RIGHT FEMORAL-ABOVE KNEE POPLITEAL ARTERY with Non Reversed Greater Saphenous Vein and Angioplasty distal vein Graft.;  Surgeon: Sherren KernsFields, Charles E, MD;  [facility];  Service: Vascular;  Laterality: Right;  . PERIPHERAL VASCULAR BALLOON ANGIOPLASTY Right 12/01/2017   Procedure: PERIPHERAL VASCULAR BALLOON ANGIOPLASTY;  Surgeon: Sherren KernsFields, Charles E, MD;  Location: [facility];  Service: Cardiovascular;  Laterality: Right;  superficial femoral, attempted  . PERIPHERAL VASCULAR INTERVENTION Left 12/01/2017   Procedure: PERIPHERAL VASCULAR INTERVENTION;  Surgeon: Sherren KernsFields, Charles E, MD;  [facility];  Service: Cardiovascular;  Laterality: Left;  External iliac  . ROTATOR CUFF REPAIR    . TONSILLECTOMY    . VEIN HARVEST Right 12/11/2017   Procedure: RIGHT GREATER SAPHENOUS VEIN HARVEST;  Surgeon: Sherren KernsFields, Charles E, MD;  [facility];  Service: Vascular;  Laterality: Right;   Social History   Occupational History  . Not on file  Tobacco Use  . Smoking status:  Current Every Day Smoker    Packs/day: 2.00    Years: 25.00    Pack years: 50.00    Types: Cigarettes  . Smokeless tobacco: Never Used  . Tobacco comment: 30 cigarettes per day  Substance and Sexual Activity  . Alcohol use: Not Currently    Alcohol/week: 20.0 standard drinks    Types: 20 Glasses of  wine per week    Comment: not since Jan 2018  . Drug use: No  . Sexual activity: Not Currently

## 2018-12-16 ENCOUNTER — Other Ambulatory Visit: Payer: Self-pay | Admitting: Family Medicine

## 2018-12-17 NOTE — Telephone Encounter (Signed)
Patient has an appointment on 12/18/18

## 2018-12-18 ENCOUNTER — Other Ambulatory Visit: Payer: Self-pay

## 2018-12-18 ENCOUNTER — Ambulatory Visit (INDEPENDENT_AMBULATORY_CARE_PROVIDER_SITE_OTHER): Payer: BLUE CROSS/BLUE SHIELD | Admitting: Family Medicine

## 2018-12-18 DIAGNOSIS — E1159 Type 2 diabetes mellitus with other circulatory complications: Secondary | ICD-10-CM

## 2018-12-18 DIAGNOSIS — E1165 Type 2 diabetes mellitus with hyperglycemia: Secondary | ICD-10-CM | POA: Diagnosis not present

## 2018-12-18 MED ORDER — EMPAGLIFLOZIN 10 MG PO TABS: 10.0000 mg | ORAL_TABLET | Freq: Every day | ORAL | 3 refills | Status: DC

## 2018-12-18 NOTE — Progress Notes (Signed)
Patient ID: Jason [name], male   DOB: [birth year], 53 y.o.   MRN: [id number]  Virtual Visit via Video Note  I connected with Atilano Median on 12/18/18 at  8:00 AM EDT by a video enabled telemedicine application and verified that I am speaking with the correct person using two identifiers.  Location patient: home Location provider:work or home office Persons participating in the virtual visit: patient, provider  I discussed the limitations of evaluation and management by telemedicine and the availability of in person appointments. The patient expressed understanding and agreed to proceed.   HPI: Type 2 diabetes.  We added Jardiance 10 mg daily last fall.  He takes also metformin 1000 mg twice daily.  His A1c back in the fall was out-of-control with 9.3%.  He states his blood sugars have been improved since starting Jardiance.  Denies any polyuria or polydipsia.  Appetite and weight stable.  Fasting blood sugars averaging around 160.  Needs follow-up A1c.  He remains on Lipitor for lipids.  No recent chest pains.   ROS: See pertinent positives and negatives per HPI.  Past Medical History:  Diagnosis Date  . Anxiety   . Depression   . Diabetes mellitus without complication (HCC)   . GERD (gastroesophageal reflux disease)   . Headache(784.0)   . Hyperlipidemia   . Hypertension   . MRSA infection    Left shin, back  . Nerve damage    BLE  . Osteoarthritis   . Peripheral vascular disease (HCC)   . Pneumonia     Past Surgical History:  Procedure Laterality Date  . ABDOMINAL AORTOGRAM W/LOWER EXTREMITY N/A 12/01/2017   Procedure: ABDOMINAL AORTOGRAM W/LOWER EXTREMITY;  Surgeon: Sherren Kerns, MD;  Location: [facility] INVASIVE [facility];  Service: Cardiovascular;  Laterality: N/A;  . FEMORAL-POPLITEAL BYPASS GRAFT Right 12/11/2017   Procedure: BYPASS GRAFT RIGHT FEMORAL-ABOVE KNEE POPLITEAL ARTERY with Non Reversed Greater Saphenous Vein and Angioplasty distal vein Graft.;  Surgeon: Sherren Kerns, MD;  [facility];  Service: Vascular;  Laterality: Right;  . PERIPHERAL VASCULAR BALLOON ANGIOPLASTY Right 12/01/2017   Procedure: PERIPHERAL VASCULAR BALLOON ANGIOPLASTY;  Surgeon: Sherren Kerns, MD;  Location: [facility] INVASIVE CV LAB;  Service: Cardiovascular;  Laterality: Right;  superficial femoral, attempted  . PERIPHERAL VASCULAR INTERVENTION Left 12/01/2017   Procedure: PERIPHERAL VASCULAR INTERVENTION;  Surgeon: Sherren Kerns, MD;  [facility] CV LAB;  Service: Cardiovascular;  Laterality: Left;  External iliac  . ROTATOR CUFF REPAIR    . TONSILLECTOMY    . VEIN HARVEST Right 12/11/2017   Procedure: RIGHT GREATER SAPHENOUS VEIN HARVEST;  Surgeon: Sherren Kerns, MD;  Location: [facility] OR;  Service: Vascular;  Laterality: Right;    Family History  Problem Relation Age of Onset  . Heart attack Father 76  . Hypertension Father   . Arthritis Mother   . Asthma Maternal Grandfather   . Stroke Paternal Grandfather   . Heart disease Other   . Mental illness Other   . Cancer Neg Hx   . Diabetes Neg Hx   . Drug abuse Neg Hx   . Early death Neg Hx   . Hearing loss Neg Hx   . Hyperlipidemia Neg Hx     SOCIAL HX: Current smoker.  Past history of alcohol abuse.  Currently works in Manufacturing engineer   Current Outpatient Medications:  .  aspirin EC 325 MG tablet, Take 1 tablet (325 mg total) by mouth daily., Disp: 30 tablet, Rfl: 0 .  atorvastatin (LIPITOR) 40 MG tablet, Take 1 tablet (40 mg total) by mouth daily., Disp: 90 tablet, Rfl: 3 .  Blood Glucose Monitoring Suppl (CONTOUR BLOOD GLUCOSE SYSTEM) DEVI, Use once daily for diabetes Dx E11.9, Disp: 1 Device, Rfl: 0 .  celecoxib (CELEBREX) 200 MG capsule, Take 1 capsule (200 mg total) by mouth 2 (two) times daily., Disp: 60 capsule, Rfl: 5 .  CONTOUR NEXT TEST test strip, TEST GLUCOSE ONCE DAILY. DX E 11.9, Disp: 100 each, Rfl: 3 .  doxycycline (VIBRA-TABS) 100 MG tablet, Take 1 tablet (100 mg total) by mouth  2 (two) times daily., Disp: 20 tablet, Rfl: 0 .  DULoxetine (CYMBALTA) 60 MG capsule, Take 1 capsule (60 mg total) by mouth daily., Disp: 90 capsule, Rfl: 3 .  empagliflozin (JARDIANCE) 10 MG TABS tablet, Take 10 mg by mouth daily., Disp: 30 tablet, Rfl: 11 .  gabapentin (NEURONTIN) 300 MG capsule, Take 2 capsules (600 mg total) by mouth 2 (two) times daily., Disp: 360 capsule, Rfl: 1 .  Lancets MISC, Contour Lancets.  Test glucose once daily. DxE11.9, Disp: 100 each, Rfl: 3 .  metFORMIN (GLUCOPHAGE) 500 MG tablet, TAKE TWO TABLETS TWICE DAILY., Disp: 360 tablet, Rfl: 3 .  metoprolol succinate (TOPROL-XL) 25 MG 24 hr tablet, Take 1 tablet (25 mg total) by mouth daily., Disp: 90 tablet, Rfl: 2 .  traZODone (DESYREL) 50 MG tablet, TAKE 1 TABLET BY MOUTH AT BEDTIME AS NEEDED FOR INSOMNIA, Disp: 90 tablet, Rfl: 1  EXAM:  VITALS per patient if applicable:  GENERAL: alert, oriented, appears well and in no acute distress  HEENT: atraumatic, conjunttiva clear, no obvious abnormalities on inspection of external nose and ears  NECK: normal movements of the head and neck  LUNGS: on inspection no signs of respiratory distress, breathing rate appears normal, no obvious gross SOB, gasping or wheezing  CV: no obvious cyanosis  MS: moves all visible extremities without noticeable abnormality  PSYCH/NEURO: pleasant and cooperative, no obvious depression or anxiety, speech and thought processing grossly intact  ASSESSMENT AND PLAN:  Discussed the following assessment and plan:  Type 2 diabetes.  History of poor control.  Improvement by home blood sugars after addition of Jardiance -Refilled Jardiance for 1 year -He needs follow-up A1c and will plan to go ahead and schedule III month follow-up and hopefully can get at that time     I discussed the assessment and treatment plan with the patient. The patient was provided an opportunity to ask questions and all were answered. The patient agreed with  the plan and demonstrated an understanding of the instructions.   The patient was advised to call back or seek an in-person evaluation if the symptoms worsen or if the condition fails to improve as anticipated.   Evelena PeatBruce Flannery Cavallero, MD

## 2019-01-23 ENCOUNTER — Other Ambulatory Visit: Payer: Self-pay

## 2019-01-23 ENCOUNTER — Telehealth: Payer: Self-pay | Admitting: Family Medicine

## 2019-01-23 MED ORDER — DOXYCYCLINE HYCLATE 100 MG PO TABS: 100.0000 mg | ORAL_TABLET | Freq: Two times a day (BID) | ORAL | 0 refills | Status: DC

## 2019-01-23 NOTE — Telephone Encounter (Signed)
Called patient and let him know that I have sent in his antibiotic and gave him the message from Dr. Caryl Never. Patient verbalized an understanding.

## 2019-01-23 NOTE — Telephone Encounter (Signed)
Please see message. °

## 2019-01-23 NOTE — Telephone Encounter (Signed)
Copied from CRM [id number]. Topic: Quick Communication - See Telephone Encounter >> Jan 23, 2019 10:00 AM Terisa Starr wrote: CRM for notification. See Telephone encounter for: 01/23/19. Patient's wife called and said he informed her last night that he was to let Dr Caryl Never know when he started getting blisters again on the bottom of his feet. She said this is all he told her. She said she wasn't sure if Dr Caryl Never wanted him to be seen or not. Please Advise.

## 2019-01-23 NOTE — Telephone Encounter (Signed)
We should probably offer follow up to assess.

## 2019-01-23 NOTE — Telephone Encounter (Signed)
He grew out staph last time.  Send in Doxycycline 100 mg po bid for 10 days.  Office follow up if not clearing with that.

## 2019-01-23 NOTE — Telephone Encounter (Signed)
Called patient and he stated that Dr. Caryl Never has already done a culture and told him to call back if the blisters came back. He stated that he was treated with antibiotic medication.  Please advise.

## 2019-01-30 ENCOUNTER — Telehealth: Payer: Self-pay | Admitting: *Deleted

## 2019-01-30 NOTE — Telephone Encounter (Signed)
Copied from CRM [id number]. Topic: Referral - Request for Referral >> Jan 30, 2019  9:31 AM Randol Kern wrote: Pt's best contact: [phone] (work phone, okay to leave VM) Pt's wife: [phone]  Has patient seen PCP for this complaint? No *If NO, is insurance requiring patient see PCP for this issue before PCP can refer them? Referral for which specialty: Either Dentist or Oral Surgeon  Preferred provider/office: Hig[facility] recommended  Reason for referral: Pt has a tooth that is "broken in the middle, but sides are still there"  Please advise  Clinic RN spoke with patient. Informed patient he does not need a referral from PCP for a dentist. He can choose his own dentist or check with his insurance to see which dentist is in network and go from there. Patient verbalized understanding.

## 2019-01-30 NOTE — Telephone Encounter (Signed)
Sounds like he needs to see a dentist.  He does not need a referral from [place] for that.

## 2019-03-18 ENCOUNTER — Encounter: Payer: Self-pay | Admitting: Family Medicine

## 2019-03-26 ENCOUNTER — Other Ambulatory Visit: Payer: Self-pay | Admitting: Family Medicine

## 2019-04-05 ENCOUNTER — Other Ambulatory Visit: Payer: Self-pay

## 2019-04-05 ENCOUNTER — Encounter: Payer: Self-pay | Admitting: Family Medicine

## 2019-04-05 ENCOUNTER — Ambulatory Visit (INDEPENDENT_AMBULATORY_CARE_PROVIDER_SITE_OTHER): Payer: BC Managed Care – PPO | Admitting: Family Medicine

## 2019-04-05 VITALS — BP 114/72 | HR 67 | Temp 97.7°F | Ht 70.0 in

## 2019-04-05 DIAGNOSIS — E7801 Familial hypercholesterolemia: Secondary | ICD-10-CM

## 2019-04-05 DIAGNOSIS — E538 Deficiency of other specified B group vitamins: Secondary | ICD-10-CM | POA: Diagnosis not present

## 2019-04-05 DIAGNOSIS — I10 Essential (primary) hypertension: Secondary | ICD-10-CM | POA: Diagnosis not present

## 2019-04-05 DIAGNOSIS — E1165 Type 2 diabetes mellitus with hyperglycemia: Secondary | ICD-10-CM

## 2019-04-05 DIAGNOSIS — E1159 Type 2 diabetes mellitus with other circulatory complications: Secondary | ICD-10-CM | POA: Diagnosis not present

## 2019-04-05 DIAGNOSIS — I739 Peripheral vascular disease, unspecified: Secondary | ICD-10-CM

## 2019-04-05 LAB — POCT GLYCOSYLATED HEMOGLOBIN (HGB A1C): Hemoglobin A1C: 8 % — AB (ref 4.0–5.6)

## 2019-04-05 MED ORDER — SITAGLIPTIN PHOSPHATE 100 MG PO TABS: 100.0000 mg | ORAL_TABLET | Freq: Every day | ORAL | 3 refills | Status: DC

## 2019-04-05 MED ORDER — METHOCARBAMOL 500 MG PO TABS: 500.0000 mg | ORAL_TABLET | Freq: Every evening | ORAL | 1 refills | Status: DC | PRN

## 2019-04-05 NOTE — Patient Instructions (Signed)
Optometrist- Dr Macarthur Critchley (on [address])  Let's set up 3 month follow up.

## 2019-04-05 NOTE — Progress Notes (Signed)
Subjective:     Patient ID: Jason [name], male   DOB: Jun 12, 1966, 53 y.o.   MRN: [id number]  HPI Patient has chronic problems including history of alcohol abuse, hypertension, type 2 diabetes, peripheral artery disease, GERD, hyperlipidemia.  Blood sugars have been poorly controlled.  His last A1c was 9.3%.  We added Jardiance.  Fasting blood sugars have improved but still run 160.  No polyuria or polydipsia.  He takes metformin and Jardiance and is also on Lipitor for hyperlipidemia.  Recent issues with neck stiffness bilaterally.  He thinks all this is stress related to work.  He has osteoarthritis and takes Celebrex daily.  He is tried some heat with minimal relief.  Wakes up with muscles feeling very stiff.  Past Medical History:  Diagnosis Date  . Anxiety   . Depression   . Diabetes mellitus without complication (HCC)   . GERD (gastroesophageal reflux disease)   . Headache(784.0)   . Hyperlipidemia   . Hypertension   . MRSA infection    Left shin, back  . Nerve damage    BLE  . Osteoarthritis   . Peripheral vascular disease (HCC)   . Pneumonia    Past Surgical History:  Procedure Laterality Date  . ABDOMINAL AORTOGRAM W/LOWER EXTREMITY N/A 12/01/2017   Procedure: ABDOMINAL AORTOGRAM W/LOWER EXTREMITY;  Surgeon: Sherren KernsFields, Charles E, MD;  Location: [facility] INVASIVE CV LAB;  Service: Cardiovascular;  Laterality: N/A;  . FEMORAL-POPLITEAL BYPASS GRAFT Right 12/11/2017   Procedure: BYPASS GRAFT RIGHT FEMORAL-ABOVE KNEE POPLITEAL ARTERY with Non Reversed Greater Saphenous Vein and Angioplasty distal vein Graft.;  Surgeon: Sherren KernsFields, Charles E, MD;  [facility];  Service: Vascular;  Laterality: Right;  . PERIPHERAL VASCULAR BALLOON ANGIOPLASTY Right 12/01/2017   Procedure: PERIPHERAL VASCULAR BALLOON ANGIOPLASTY;  Surgeon: Sherren KernsFields, Charles E, MD;  Location: [facility] INVAS[facility] CV LAB;  Service: Cardiovascular;  Laterality: Right;  superficial femoral, attempted  . PERIPHERAL VASCULAR INTERVENTION  Left 12/01/2017   Procedure: PERIPHERAL VASCULAR INTERVENTION;  Surgeon: Sherren KernsFields, Charles E, MD;  [facility];  Service: Cardiovascular;  Laterality: Left;  External iliac  . ROTATOR CUFF REPAIR    . TONSILLECTOMY    . VEIN HARVEST Right 12/11/2017   Procedure: RIGHT GREATER SAPHENOUS VEIN HARVEST;  Surgeon: Sherren KernsFields, Charles E, MD;  [facility];  Service: Vascular;  Laterality: Right;    reports that he has been smoking cigarettes. He has a 50.00 pack-year smoking history. He has never used smokeless tobacco. He reports previous alcohol use of about 20.0 standard drinks of alcohol per week. He reports that he does not use drugs. family history includes Arthritis in his mother; Asthma in his maternal grandfather; Heart attack (age of onset: 10548) in his father; Heart disease in an other family member; Hypertension in his father; Mental illness in an other family member; Stroke in his paternal grandfather. Allergies  Allergen Reactions  . Olmesartan Medoxomil Other (See Comments)    REACTION: dizziness     Review of Systems  Constitutional: Negative for fatigue and unexpected weight change.  Eyes: Negative for visual disturbance.  Respiratory: Negative for cough, chest tightness and shortness of breath.   Cardiovascular: Negative for chest pain, palpitations and leg swelling.  Endocrine: Negative for polydipsia and polyuria.  Musculoskeletal: Positive for arthralgias.  Neurological: Negative for dizziness, syncope, weakness, light-headedness and headaches.       Objective:   Physical Exam Constitutional:      Appearance: He is well-developed.  HENT:  Right Ear: External ear normal.     Left Ear: External ear normal.  Eyes:     Pupils: Pupils are equal, round, and reactive to light.  Neck:     Musculoskeletal: Neck supple.     Thyroid: No thyromegaly.  Cardiovascular:     Rate and Rhythm: Normal rate and regular rhythm.  Pulmonary:     Effort: Pulmonary effort  is normal. No respiratory distress.     Breath sounds: Normal breath sounds. No wheezing or rales.  Skin:    Comments: Feet reveal no skin lesions. Good distal foot pulses. Good capillary refill. No calluses. Normal sensation with monofilament testing   Neurological:     Mental Status: He is alert and oriented to person, place, and time.        Assessment:     #1 type 2 diabetes improved but still suboptimal control with A1c 8.0%  #2 hypertension stable and at goal  #3 dyslipidemia  #4 chronic muscular neck tension    Plan:     -Add Januvia 100 mg daily and continue Jardiance and metformin -Discussed dietary factors and diabetes -We will need lipids at follow-up.  Plan to get fasting labs in 3 months -Set up optometry follow-up for good eye screening -Discussed trial of Robaxin 500 mg nightly as needed for muscle tension  Eulas Post MD [facility]

## 2019-04-12 ENCOUNTER — Encounter: Payer: Self-pay | Admitting: Emergency Medicine

## 2019-04-12 ENCOUNTER — Ambulatory Visit
Admission: EM | Admit: 2019-04-12 | Discharge: 2019-04-12 | Disposition: A | Discharge status: Home / Self Care | Payer: BC Managed Care – PPO | Attending: Emergency Medicine | Admitting: Emergency Medicine

## 2019-04-12 ENCOUNTER — Other Ambulatory Visit: Payer: Self-pay

## 2019-04-12 DIAGNOSIS — M542 Cervicalgia: Secondary | ICD-10-CM | POA: Diagnosis not present

## 2019-04-12 DIAGNOSIS — M5412 Radiculopathy, cervical region: Secondary | ICD-10-CM

## 2019-04-12 MED ORDER — PREDNISONE 10 MG (21) PO TBPK: ORAL_TABLET | Freq: Every day | ORAL | 0 refills | Status: DC

## 2019-04-12 NOTE — ED Triage Notes (Signed)
Patient presents to [facility] for 2 weeks of acute on chronic right neck pain.  States he has not had a flair up x 2 years, and denies known injury this time.  Patient states he usually gets an injection in his neck with these issues.

## 2019-04-12 NOTE — Discharge Instructions (Signed)
Take steroid taper as prescribed. May ice, rest, elevate area is causing most pain.  Can also use hot compresses/warm wash rags to relieve muscle tightness. May use OTC Tylenol, ibuprofen as needed for pain. Important to follow-up with Dr. Ernestina Patches for further evaluation/management

## 2019-04-12 NOTE — ED Provider Notes (Signed)
[name]    CSN: [id number] Arrival date & time: 04/12/19  1628     History   Chief Complaint Chief Complaint  Patient presents with  . Neck Pain    HPI Jason [name] is a 53 y.o. male with history of uncontrolled diabetes, chronic cervical radiculopathy, and hypertension presenting for 2-week course of neck pain with intermittent right-sided radiation/paresthesias.  Patient denies upper extremity weakness, trauma or fall.  Patient ia followed by Dr. Alvester MorinNewton of physical medicine rehabilitation: Last underwent an epidural steroid injection of C7- T1 on 11/15/2018, which he tolerated well.  Patient states he was mowing the lawn this morning using a seated lawnmower and thinks the jostling around may have "made it flareup or something ".  Patient denies thoracic or lumbar back pain, saddle area anesthesia, change in bowel or bladder habit, lower extremity weakness.  Patient has tried muscle relaxers which his PCP prescribed at last appointment on 8/7 which did not help today.   Past Medical History:  Diagnosis Date  . Anxiety   . Depression   . Diabetes mellitus without complication (HCC)   . GERD (gastroesophageal reflux disease)   . Headache(784.0)   . Hyperlipidemia   . Hypertension   . MRSA infection    Left shin, back  . Nerve damage    BLE  . Osteoarthritis   . Peripheral vascular disease (HCC)   . Pneumonia     Patient Active Problem List   Diagnosis Date Noted  . PAD (peripheral artery disease) (HCC) 12/11/2017  . Preoperative cardiovascular examination 12/05/2017  . Peripheral arterial disease (HCC) 10/23/2017  . History of MRSA infection 10/23/2017  . Intermittent claudication (HCC) 10/17/2017  . Herniated cervical intervertebral disc 08/01/2017  . Osteoarthritis of spine 08/01/2017  . Poorly controlled type 2 diabetes mellitus with circulatory disorder (HCC) 07/11/2017  . Polyarthralgia 06/27/2017  . Neck pain 06/27/2017  . Primary  osteoarthritis of both knees 05/02/2017  . Sensory neuronopathy 01/14/2017  . Transaminasemia 10/10/2016  . Vitamin B 12 deficiency 09/19/2016  . Hyperglycemia 09/19/2016  . Chest pressure 07/04/2012  . Cough 07/04/2012  . Hyperlipidemia 10/12/2010  . ANXIETY 10/12/2010  . History of alcohol abuse 10/12/2010  . TOBACCO USE 10/12/2010  . DEPRESSION 10/12/2010  . Essential hypertension, benign 10/12/2010  . GERD 10/12/2010  . Osteoarthritis 10/12/2010  . Sleep apnea 10/12/2010    Past Surgical History:  Procedure Laterality Date  . ABDOMINAL AORTOGRAM W/LOWER EXTREMITY N/A 12/01/2017   Procedure: ABDOMINAL AORTOGRAM W/LOWER EXTREMITY;  Surgeon: Sherren KernsFields, Charles E, MD;  Location: [facility];  Service: Cardiovascular;  Laterality: N/A;  . FEMORAL-POPLITEAL BYPASS GRAFT Right 12/11/2017   Procedure: BYPASS GRAFT RIGHT FEMORAL-ABOVE KNEE POPLITEAL ARTERY with Non Reversed Greater Saphenous Vein and Angioplasty distal vein Graft.;  Surgeon: Sherren KernsFields, Charles E, MD;  [facility];  Service: Vascular;  Laterality: Right;  . PERIPHERAL VASCULAR BALLOON ANGIOPLASTY Right 12/01/2017   Procedure: PERIPHERAL VASCULAR BALLOON ANGIOPLASTY;  Surgeon: Sherren KernsFields, Charles E, MD;  Location: [facility];  Service: Cardiovascular;  Laterality: Right;  superficial femoral, attempted  . PERIPHERAL VASCULAR INTERVENTION Left 12/01/2017   Procedure: PERIPHERAL VASCULAR INTERVENTION;  Surgeon: Sherren KernsFields, Charles E, MD;  [facility];  Service: Cardiovascular;  Laterality: Left;  External iliac  . ROTATOR CUFF REPAIR    . TONSILLECTOMY    . VEIN HARVEST Right 12/11/2017   Procedure: RIGHT GREATER SAPHENOUS VEIN HARVEST;  Surgeon: Sherren KernsFields, Charles E, MD;  [facility];  Service: Vascular;  Laterality: Right;       Home Medications    Prior to Admission medications   Medication Sig Start Date End Date Taking? Authorizing Provider  aspirin EC 325 MG tablet Take 1 tablet (325 mg total) by  mouth daily. 12/05/17   Revankar, Reita Cliche, MD  atorvastatin (LIPITOR) 40 MG tablet TAKE 1 TABLET BY MOUTH EVERY DAY 03/26/19   Burchette, Alinda Sierras, MD  Blood Glucose Monitoring Suppl (CONTOUR BLOOD GLUCOSE SYSTEM) DEVI Use once daily for diabetes Dx E11.9 07/14/17   Burchette, Alinda Sierras, MD  celecoxib (CELEBREX) 200 MG capsule TAKE 1 CAPSULE BY MOUTH TWICE A DAY 03/26/19   Burchette, Alinda Sierras, MD  CONTOUR NEXT TEST test strip TEST GLUCOSE ONCE DAILY. DX E 11.9 05/30/18   Burchette, Alinda Sierras, MD  DULoxetine (CYMBALTA) 60 MG capsule Take 1 capsule (60 mg total) by mouth daily. 11/15/18 11/15/19  Gerda Diss, DO  empagliflozin (JARDIANCE) 10 MG TABS tablet Take 10 mg by mouth daily. 12/18/18   Burchette, Alinda Sierras, MD  gabapentin (NEURONTIN) 300 MG capsule Take 2 capsules (600 mg total) by mouth 2 (two) times daily. 11/15/18   Gerda Diss, DO  Lancets MISC Contour Lancets.  Test glucose once daily. DxE11.9 07/14/17   Burchette, Alinda Sierras, MD  metFORMIN (GLUCOPHAGE) 500 MG tablet TAKE TWO TABLETS TWICE DAILY. 08/15/18   Burchette, Alinda Sierras, MD  methocarbamol (ROBAXIN) 500 MG tablet Take 1 tablet (500 mg total) by mouth at bedtime as needed for muscle spasms. 04/05/19   Burchette, Alinda Sierras, MD  metoprolol succinate (TOPROL-XL) 25 MG 24 hr tablet Take 1 tablet (25 mg total) by mouth daily. 12/05/17   Revankar, Reita Cliche, MD  predniSONE (STERAPRED UNI-PAK 21 TAB) 10 MG (21) TBPK tablet Take by mouth daily. Take steroid taper as written 04/12/19   Hall-Potvin, [place], PA-C  sitaGLIPtin (JANUVIA) 100 MG tablet Take 1 tablet (100 mg total) by mouth daily. 04/05/19   Burchette, Alinda Sierras, MD  traZODone (DESYREL) 50 MG tablet TAKE 1 TABLET BY MOUTH AT BEDTIME AS NEEDED FOR INSOMNIA 07/17/18   Burchette, Alinda Sierras, MD    Family History Family History  Problem Relation Age of Onset  . Heart attack Father 59  . Hypertension Father   . Arthritis Mother   . Asthma Maternal Grandfather   . Stroke Paternal Grandfather   . Heart  disease Other   . Mental illness Other   . Cancer Neg Hx   . Diabetes Neg Hx   . Drug abuse Neg Hx   . Early death Neg Hx   . Hearing loss Neg Hx   . Hyperlipidemia Neg Hx     Social History Social History   Tobacco Use  . Smoking status: Current Every Day Smoker    Packs/day: 2.00    Years: 25.00    Pack years: 50.00    Types: Cigarettes  . Smokeless tobacco: Never Used  . Tobacco comment: 30 cigarettes per day  Substance Use Topics  . Alcohol use: Not Currently    Alcohol/week: 20.0 standard drinks    Types: 20 Glasses of wine per week    Comment: not since Jan 2018  . Drug use: No     Allergies   Olmesartan medoxomil   Review of Systems Review of Systems  Constitutional: Negative for fatigue and fever.  Respiratory: Negative for cough and shortness of breath.   Cardiovascular: Negative for chest pain and palpitations.  Gastrointestinal:  Negative for abdominal pain, diarrhea and vomiting.  Musculoskeletal: Positive for neck pain and neck stiffness. Negative for arthralgias and myalgias.  Skin: Negative for rash and wound.  Neurological: Negative for speech difficulty and headaches.  All other systems reviewed and are negative.    Physical Exam Triage Vital Signs ED Triage Vitals  Enc Vitals Group     BP 04/12/19 1638 (!) 145/76     Pulse Rate 04/12/19 1638 81     Resp 04/12/19 1638 18     Temp 04/12/19 1638 97.9 F (36.6 C)     Temp Source 04/12/19 1638 Oral     SpO2 04/12/19 1638 95 %     Weight --      Height --      Head Circumference --      Peak Flow --      Pain Score 04/12/19 1637 8     Pain Loc --      Pain Edu? --      Excl. in GC? --    No data found.  Updated Vital Signs BP (!) 145/76 (BP Location: Left Arm)   Pulse 81   Temp 97.9 F (36.6 C) (Oral)   Resp 18   SpO2 95%   Visual Acuity Right Eye Distance:   Left Eye Distance:   Bilateral Distance:    Right Eye Near:   Left Eye Near:    Bilateral Near:     Physical Exam  Constitutional:      General: He is not in acute distress. HENT:     Head: Normocephalic and atraumatic.  Eyes:     General: No scleral icterus.    Pupils: Pupils are equal, round, and reactive to light.  Neck:     Musculoskeletal: Neck supple.     Vascular: No carotid bruit.     Comments: No obvious cervical deformity, surgical scars, erythema, ecchymosis.  Mild right-sided neck tenderness without cervical spine tenderness.  No fluctuance, masses, warmth appreciated with palpation.  Mildly decreased range of motion when turning head side to side which patient states is chronic/stable.  Full neck flexion and extension. Cardiovascular:     Rate and Rhythm: Normal rate.  Pulmonary:     Effort: Pulmonary effort is normal.  Musculoskeletal:     Comments: Slightly decreased shoulder extension bilaterally second to stiffness/pain.  5/5 shoulder and grip strength bilaterally.  NVI.  Lymphadenopathy:     Cervical: No cervical adenopathy.  Skin:    Coloration: Skin is not jaundiced or pale.  Neurological:     Mental Status: He is alert and oriented to person, place, and time.      UC Treatments / Results  Labs (all labs ordered are listed, but only abnormal results are displayed) Labs Reviewed - No data to display  EKG   Radiology No results found.  Procedures Procedures (including critical care time)  Medications Ordered in UC Medications - No data to display  Initial Impression / Assessment and Plan / UC Course  I have reviewed the triage vital signs and the nursing notes.  Pertinent labs & imaging results that were available during my care of the patient were reviewed by me and considered in my medical decision making (see chart for details).     1.  Cervicalgia Acute on chronic: We will treat with steroid taper as listed below given patient's improvement in A1c (labs reviewed by me: Improved to 8% on 04/05/2019 as compared to 9.3% on 05/23/2018) benefit outweighs  risk.   2.  Cervical radiculopathy Previous records were reviewed by me at time of appointment: Known C-spine discopathy as per MRI on 07/27/2017, particularly at C6-7 level.  Patient to follow-up with his routine provider, Dr. Alvester MorinNewton, next week.  Return precautions discussed, patient verbalized understanding and is agreeable to plan.  Final Clinical Impressions(s) / UC Diagnoses   Final diagnoses:  Cervicalgia  Cervical radiculopathy, acute     Discharge Instructions     Take steroid taper as prescribed. May ice, rest, elevate area is causing most pain.  Can also use hot compresses/warm wash rags to relieve muscle tightness. May use OTC Tylenol, ibuprofen as needed for pain. Important to follow-up with Dr. Alvester MorinNewton for further evaluation/management    ED Prescriptions    Medication Sig Dispense Auth. Provider   predniSONE (STERAPRED UNI-PAK 21 TAB) 10 MG (21) TBPK tablet Take by mouth daily. Take steroid taper as written 21 tablet Hall-Potvin, [place], PA-C     Controlled Substance Prescriptions [place] Controlled Substance Registry consulted? Not Applicable   Jetta LoutHall-Potvin, , PA-C 04/12/19 1904

## 2019-04-20 ENCOUNTER — Encounter (HOSPITAL_COMMUNITY): Payer: Self-pay | Admitting: *Deleted

## 2019-04-20 ENCOUNTER — Other Ambulatory Visit: Payer: Self-pay

## 2019-04-20 ENCOUNTER — Emergency Department (HOSPITAL_COMMUNITY): Payer: BC Managed Care – PPO

## 2019-04-20 ENCOUNTER — Emergency Department (HOSPITAL_COMMUNITY)
Admission: EM | Admit: 2019-04-20 | Discharge: 2019-04-20 | Disposition: A | Discharge status: Home / Self Care | Payer: BC Managed Care – PPO | Attending: Emergency Medicine | Admitting: Emergency Medicine

## 2019-04-20 DIAGNOSIS — E1140 Type 2 diabetes mellitus with diabetic neuropathy, unspecified: Secondary | ICD-10-CM | POA: Insufficient documentation

## 2019-04-20 DIAGNOSIS — E1151 Type 2 diabetes mellitus with diabetic peripheral angiopathy without gangrene: Secondary | ICD-10-CM | POA: Diagnosis not present

## 2019-04-20 DIAGNOSIS — M545 Low back pain, unspecified: Secondary | ICD-10-CM

## 2019-04-20 DIAGNOSIS — Z79899 Other long term (current) drug therapy: Secondary | ICD-10-CM | POA: Insufficient documentation

## 2019-04-20 DIAGNOSIS — M87059 Idiopathic aseptic necrosis of unspecified femur: Secondary | ICD-10-CM | POA: Insufficient documentation

## 2019-04-20 DIAGNOSIS — M160 Bilateral primary osteoarthritis of hip: Secondary | ICD-10-CM

## 2019-04-20 DIAGNOSIS — F17210 Nicotine dependence, cigarettes, uncomplicated: Secondary | ICD-10-CM | POA: Diagnosis not present

## 2019-04-20 DIAGNOSIS — Z7982 Long term (current) use of aspirin: Secondary | ICD-10-CM | POA: Insufficient documentation

## 2019-04-20 DIAGNOSIS — M87051 Idiopathic aseptic necrosis of right femur: Secondary | ICD-10-CM | POA: Diagnosis not present

## 2019-04-20 MED ORDER — OXYCODONE-ACETAMINOPHEN 5-325 MG PO TABS: 1.0000 | ORAL_TABLET | Freq: Four times a day (QID) | ORAL | 0 refills | Status: DC | PRN

## 2019-04-20 MED ORDER — LIDOCAINE 5 % EX PTCH
1.0000 | MEDICATED_PATCH | CUTANEOUS | Status: DC
  Administered 2019-04-20: 1 via TRANSDERMAL
  Filled 2019-04-20: qty 1

## 2019-04-20 MED ORDER — LIDOCAINE 5 % EX PTCH: 1.0000 | MEDICATED_PATCH | CUTANEOUS | 0 refills | Status: DC

## 2019-04-20 MED ORDER — KETOROLAC TROMETHAMINE 15 MG/ML IJ SOLN
15.0000 mg | Freq: Once | INTRAMUSCULAR | Status: AC
  Administered 2019-04-20: 15 mg via INTRAVENOUS
  Filled 2019-04-20: qty 1

## 2019-04-20 MED ORDER — OXYCODONE-ACETAMINOPHEN 5-325 MG PO TABS
1.0000 | ORAL_TABLET | Freq: Once | ORAL | Status: AC
  Administered 2019-04-20: 1 via ORAL
  Filled 2019-04-20: qty 1

## 2019-04-20 MED ORDER — KETOROLAC TROMETHAMINE 60 MG/2ML IM SOLN: 30.0000 mg | Freq: Once | INTRAMUSCULAR | Status: DC

## 2019-04-20 MED ORDER — NAPROXEN 500 MG PO TABS: 500.0000 mg | ORAL_TABLET | Freq: Two times a day (BID) | ORAL | 0 refills | Status: DC

## 2019-04-20 NOTE — ED Provider Notes (Signed)
[facility]GENCY [facility] Provider Note   CSN: [id number] Arrival date & time: 04/20/19  [id number]     History   Chief Complaint Chief Complaint  Patient presents with  . Back Pain    HPI Jason [name] is a 53 y.o. male with a hx of anxiety, depression, DM, HTN, hyperlipidemia, osteoarthritis, LE neuropathy, & prior femoral-popliteal bypass graft to RLE who presents to the ED with complaints of back pain which began shortly PTA this AM. Patient states he was hitching his boat when the boat started to move causing him to jerk forward w/ quick onset of back pain. He states he did not fall to the ground and no objects fell on him. He was able to steady himself & walk back into the house. He states his pain is to the middle and R side of the back w/ some radiation to the R upper leg- does not radiate al the way down. Pain is a 7/10 in severity, worse with ambulation, standing upright, & movement. No alleviating factors. Tried 1000 mg of Robaxin PTA. Denies numbness, tingling, weakness, saddle anesthesia, incontinence to bowel/bladder, fever, chills, IV drug use, dysuria, or hx of cancer. Patient has not had prior back surgeries. He has had similar but less severe back pain.      HPI  Past Medical History:  Diagnosis Date  . Anxiety   . Depression   . Diabetes mellitus without complication (HCC)   . GERD (gastroesophageal reflux disease)   . Headache(784.0)   . Hyperlipidemia   . Hypertension   . MRSA infection    Left shin, back  . Nerve damage    BLE  . Osteoarthritis   . Peripheral vascular disease (HCC)   . Pneumonia     Patient Active Problem List   Diagnosis Date Noted  . PAD (peripheral artery disease) (HCC) 12/11/2017  . Preoperative cardiovascular examination 12/05/2017  . Peripheral arterial disease (HCC) 10/23/2017  . History of MRSA infection 10/23/2017  . Intermittent claudication (HCC) 10/17/2017  . Herniated cervical intervertebral disc  08/01/2017  . Osteoarthritis of spine 08/01/2017  . Poorly controlled type 2 diabetes mellitus with circulatory disorder (HCC) 07/11/2017  . Polyarthralgia 06/27/2017  . Neck pain 06/27/2017  . Primary osteoarthritis of both knees 05/02/2017  . Sensory neuronopathy 01/14/2017  . Transaminasemia 10/10/2016  . Vitamin B 12 deficiency 09/19/2016  . Hyperglycemia 09/19/2016  . Chest pressure 07/04/2012  . Cough 07/04/2012  . Hyperlipidemia 10/12/2010  . ANXIETY 10/12/2010  . History of alcohol abuse 10/12/2010  . TOBACCO USE 10/12/2010  . DEPRESSION 10/12/2010  . Essential hypertension, benign 10/12/2010  . GERD 10/12/2010  . Osteoarthritis 10/12/2010  . Sleep apnea 10/12/2010    Past Surgical History:  Procedure Laterality Date  . ABDOMINAL AORTOGRAM W/LOWER EXTREMITY N/A 12/01/2017   Procedure: ABDOMINAL AORTOGRAM W/LOWER EXTREMITY;  Surgeon: Sherren KernsFields, Charles E, MD;  Location: [facility] INVASIVE CV LAB;  Service: Cardiovascular;  Laterality: N/A;  . FEMORAL-POPLITEAL BYPASS GRAFT Right 12/11/2017   Procedure: BYPASS GRAFT RIGHT FEMORAL-ABOVE KNEE POPLITEAL ARTERY with Non Reversed Greater Saphenous Vein and Angioplasty distal vein Graft.;  Surgeon: Sherren KernsFields, Charles E, MD;  [facility];  Service: Vascular;  Laterality: Right;  . PERIPHERAL VASCULAR BALLOON ANGIOPLASTY Right 12/01/2017   Procedure: PERIPHERAL VASCULAR BALLOON ANGIOPLASTY;  Surgeon: Sherren KernsFields, Charles E, MD;  Location: [facility] INVASIVE CV LAB;  Service: Cardiovascular;  Laterality: Right;  superficial femoral, attempted  . PERIPHERAL VASCULAR INTERVENTION Left 12/01/2017   Procedure: PERIPHERAL  VASCULAR INTERVENTION;  Surgeon: Sherren KernsFields, Charles E, MD;  [facility];  Service: Cardiovascular;  Laterality: Left;  External iliac  . ROTATOR CUFF REPAIR    . TONSILLECTOMY    . VEIN HARVEST Right 12/11/2017   Procedure: RIGHT GREATER SAPHENOUS VEIN HARVEST;  Surgeon: Sherren KernsFields, Charles E, MD;  [facility];  Service: Vascular;   Laterality: Right;        Home Medications    Prior to Admission medications   Medication Sig Start Date End Date Taking? Authorizing Provider  aspirin EC 325 MG tablet Take 1 tablet (325 mg total) by mouth daily. 12/05/17   Revankar, Aundra Dubinajan R, MD  atorvastatin (LIPITOR) 40 MG tablet TAKE 1 TABLET BY MOUTH EVERY DAY 03/26/19   Burchette, Elberta FortisBruce W, MD  Blood Glucose Monitoring Suppl (CONTOUR BLOOD GLUCOSE SYSTEM) DEVI Use once daily for diabetes Dx E11.9 07/14/17   Burchette, Elberta FortisBruce W, MD  celecoxib (CELEBREX) 200 MG capsule TAKE 1 CAPSULE BY MOUTH TWICE A DAY 03/26/19   Burchette, Elberta FortisBruce W, MD  CONTOUR NEXT TEST test strip TEST GLUCOSE ONCE DAILY. DX E 11.9 05/30/18   Burchette, Elberta FortisBruce W, MD  DULoxetine (CYMBALTA) 60 MG capsule Take 1 capsule (60 mg total) by mouth daily. 11/15/18 11/15/19  Andrena Mewsigby, Michael D, DO  empagliflozin (JARDIANCE) 10 MG TABS tablet Take 10 mg by mouth daily. 12/18/18   Burchette, Elberta FortisBruce W, MD  gabapentin (NEURONTIN) 300 MG capsule Take 2 capsules (600 mg total) by mouth 2 (two) times daily. 11/15/18   Andrena Mewsigby, Michael D, DO  Lancets MISC Contour Lancets.  Test glucose once daily. DxE11.9 07/14/17   Burchette, Elberta FortisBruce W, MD  metFORMIN (GLUCOPHAGE) 500 MG tablet TAKE TWO TABLETS TWICE DAILY. 08/15/18   Burchette, Elberta FortisBruce W, MD  methocarbamol (ROBAXIN) 500 MG tablet Take 1 tablet (500 mg total) by mouth at bedtime as needed for muscle spasms. 04/05/19   Burchette, Elberta FortisBruce W, MD  metoprolol succinate (TOPROL-XL) 25 MG 24 hr tablet Take 1 tablet (25 mg total) by mouth daily. 12/05/17   Revankar, Aundra Dubinajan R, MD  predniSONE (STERAPRED UNI-PAK 21 TAB) 10 MG (21) TBPK tablet Take by mouth daily. Take steroid taper as written 04/12/19   Hall-Potvin, [place], PA-C  sitaGLIPtin (JANUVIA) 100 MG tablet Take 1 tablet (100 mg total) by mouth daily. 04/05/19   Burchette, Elberta FortisBruce W, MD  traZODone (DESYREL) 50 MG tablet TAKE 1 TABLET BY MOUTH AT BEDTIME AS NEEDED FOR INSOMNIA 07/17/18   Burchette, Elberta FortisBruce W, MD     Family History Family History  Problem Relation Age of Onset  . Heart attack Father [id number]  . Hypertension Father   . Arthritis Mother   . Asthma Maternal Grandfather   . Stroke Paternal Grandfather   . Heart disease Other   . Mental illness Other   . Cancer Neg Hx   . Diabetes Neg Hx   . Drug abuse Neg Hx   . Early death Neg Hx   . Hearing loss Neg Hx   . Hyperlipidemia Neg Hx     Social History Social History   Tobacco Use  . Smoking status: Current Every Day Smoker    Packs/day: 2.00    Years: 25.00    Pack years: 50.00    Types: Cigarettes  . Smokeless tobacco: Never Used  . Tobacco comment: 30 cigarettes per day  Substance Use Topics  . Alcohol use: Not Currently    Alcohol/week: 20.0 standard drinks    Types: 20 Glasses of wine  per week    Comment: not since Jan 2018  . Drug use: No     Allergies   Olmesartan medoxomil   Review of Systems Review of Systems  Constitutional: Negative for chills, fever and unexpected weight change.  Gastrointestinal: Negative for abdominal pain, nausea and vomiting.  Genitourinary: Negative for dysuria.  Musculoskeletal: Positive for back pain.  Neurological: Negative for weakness and numbness.       Negative for saddle anesthesia or bowel/bladder incontinence.   All other systems reviewed and are negative.    Physical Exam Updated Vital Signs BP (!) 145/69 (BP Location: Right Arm)   Pulse 70   Temp 98.2 F (36.8 C) (Oral)   Resp 18   Ht 5\' 10"  (1.778 m)   Wt 96.2 kg   SpO2 97%   BMI 30.42 kg/m   Physical Exam Constitutional:      General: He is in acute distress (mild appears uncomfortable).     Appearance: He is well-developed. He is not toxic-appearing.  HENT:     Head: Normocephalic and atraumatic.  Neck:     Musculoskeletal: Normal range of motion and neck supple. No spinous process tenderness or muscular tenderness.  Cardiovascular:     Rate and Rhythm: Normal rate and regular rhythm.     Comments: 2+  symmetric DP pulses.  Pulmonary:     Effort: Pulmonary effort is normal.     Breath sounds: Normal breath sounds.  Abdominal:     General: There is no distension.     Palpations: Abdomen is soft.     Tenderness: There is no abdominal tenderness. There is no guarding or rebound.  Musculoskeletal:     Comments: No obvious deformity, appreciable swelling, erythema, ecchymosis, significant open wounds, or increased warmth.  Lower extremities: Intact AROM throughout. No focal areas of tenderness.   Back: Patient has diffuse tenderness to the L spine & R lumbar paraspinal region without point/focal vertebral tenderness, no palpable step off or crepitus. Otherwise nontender.   Skin:    General: Skin is warm and dry.     Findings: No rash.  Neurological:     Mental Status: He is alert.     Deep Tendon Reflexes:     Reflex Scores:      Patellar reflexes are 2+ on the right side and 2+ on the left side.    Comments: Sensation grossly intact to bilateral lower extremities. 5/5 symmetric strength with plantar/dorsiflexion bilaterally. Gait intact but antalgic, no obvious footdrop. Able to actively lift each leg off of the bed, some pain with R hip flexion. .     ED Treatments / Results  Labs (all labs ordered are listed, but only abnormal results are displayed) Labs Reviewed - No data to display  EKG None  Radiology Ct Lumbar Spine Wo Contrast  Result Date: 04/20/2019 CLINICAL DATA:  States he was hitching his boat this am and the boat fell and he injured his back states the pain radiates down his leg and its very painful to walk EXAM: CT LUMBAR SPINE WITHOUT CONTRAST TECHNIQUE: Multidetector CT imaging of the lumbar spine was performed without intravenous contrast administration. Multiplanar CT image reconstructions were also generated. COMPARISON:  None. FINDINGS: Segmentation: 5 lumbar type vertebrae. Alignment: Normal. Vertebrae: No acute fracture or focal pathologic process. Other: Mild  osteoarthritis of bilateral SI joints. Subchondral serpiginous signal abnormality with associated sclerosis in the superior femoral heads bilaterally consistent with a vascular necrosis. Moderate osteoarthritis of the right hip. Mild  osteoarthritis of the left hip. Paraspinal and other soft tissues: No acute paraspinal abnormality. Abdominal aortic atherosclerosis. Visualized bowel demonstrates no focal abnormality. Enlarged prostate gland. Disc levels: Mild degenerative disease with disc height loss at T12-L1. T12-L1: No disc protrusion, foraminal stenosis or central canal stenosis. L1-L2: No disc protrusion, foraminal stenosis or central canal stenosis. L2-L3: No disc protrusion, foraminal stenosis or central canal stenosis. L3-L4: No disc protrusion, foraminal stenosis or central canal stenosis. Mild bilateral facet arthropathy. L4-L5: Mild broad-based disc bulge. Mild bilateral facet arthropathy. No foraminal or central canal stenosis. L5-S1: Broad-based disc bulge with a small left paracentral disc protrusion. Mild bilateral facet arthropathy. No foraminal stenosis. IMPRESSION: 1.  No acute osseous injury of the lumbar spine. 2. Avascular necrosis of bilateral femoral heads. Moderate osteoarthritis of the right hip. Mild osteoarthritis of the left hip. Electronically Signed   By: Elige KoHetal  Patel   On: 04/20/2019 12:10    Procedures Procedures (including critical care time)  Medications Ordered in ED Medications  lidocaine (LIDODERM) 5 % 1 patch (1 patch Transdermal Patch Applied 04/20/19 1127)  oxyCODONE-acetaminophen (PERCOCET/ROXICET) 5-325 MG per tablet 1 tablet (1 tablet Oral Given 04/20/19 1127)  ketorolac (TORADOL) 15 MG/ML injection 15 mg (15 mg Intravenous Given 04/20/19 1128)     Initial Impression / Assessment and Plan / ED Course  I have reviewed the triage vital signs and the nursing notes.  Pertinent labs & imaging results that were available during my care of the patient were reviewed  by me and considered in my medical decision making (see chart for details).    Patient presents with complaint of back pain.  Patient is nontoxic appearing, vitals are WNL other than elevated BP- doubt HTN emergency. Patient has midline & right paraspinal muscle tenderness to the L spine region w/o point/focal vertebral tenderness or palpable step off. He has no focal neuro deficits, no incontinence or saddle anesthesia- do not suspect cauda equina syndrome. No fever or hx of IVDU to suggest spinal abscess. W/ reproducibility and movement causing onset of pain doubt dissection. No urinary sxs to suggest UTI/pyelo & again after movement pain started doubt nephrolithiasis. Will provide analgesics & re-assess. Plan for CT w/o contrast of L spine to evaluate for herniated disc, however suspect this is likely muscular in etiology.   CT L spine: 1.  No acute osseous injury of the lumbar spine. 2. Avascular necrosis of bilateral femoral heads. Moderate osteoarthritis of the right hip. Mild osteoarthritis of the left hip  Feel patient's pain is likely muscular in nature.  Will need outpatient orthopedics follow up for OA & avascular necrosis findings on CT.   14:06: RE-EVAL: Pain improved, requesting discharge.   Will DC home with short course of percocet, lidoderm patches, & naproxen ( last creatinine WNL). He also has muscle relaxant @ home. Orthopedics follow up provided. I discussed results, treatment plan, need for follow-up, and return precautions with the patient & his wife @ bedside. Provided opportunity for questions, patient & his wife confirmed understanding and are in agreement with plan.   Findings and plan of care discussed with supervising physician Dr. Rosalia Hammersay who is in agreement.    Final Clinical Impressions(s) / ED Diagnoses   Final diagnoses:  Acute right-sided low back pain without sciatica  Avascular necrosis of femoral head, unspecified laterality (HCC)  Osteoarthritis of both hips,  unspecified osteoarthritis type    ED Discharge Orders         Ordered    oxyCODONE-acetaminophen (PERCOCET/ROXICET)  5-325 MG tablet  Every 6 hours PRN     04/20/19 1411    naproxen (NAPROSYN) 500 MG tablet  2 times daily     04/20/19 1411    lidocaine (LIDODERM) 5 %  Every 24 hours     04/20/19 1411           Petrucelli, [place] R, PA-C 04/20/19 1415    Margarita Grizzle, MD 04/20/19 1525

## 2019-04-20 NOTE — Discharge Instructions (Addendum)
You were seen in the emergency department today for back pain.  Your CT scan did not show any specific injuries of the back/spine.  CT scan did show some arthritis and avascular necrosis in your hip area these will need to be followed up with an orthopedic doctor, please follow-up by calling within 3 days to schedule a future appointment.  We suspect that your back pain is mostly muscular in nature.  We recommend application of heat.  Please try to rest will be sure to maintain some activity throughout the day to avoid stiffness.  We are sending you home with the following medications:  - Naproxen is a nonsteroidal anti-inflammatory medication that will help with pain and swelling. Be sure to take this medication as prescribed with food, 1 pill every 12 hours,  It should be taken with food, as it can cause stomach upset, and more seriously, stomach bleeding. Do not take other nonsteroidal anti-inflammatory medications with this such as Advil, Motrin, Aleve, Mobic, Goodie Powder, or Motrin.    -Percocet-this is a narcotic/controlled substance medication that has potential addicting qualities.  We recommend that you take 1-2 tablets every 6 hours as needed for severe pain.  Do not drive or operate heavy machinery when taking this medicine as it can be sedating. Do not drink alcohol or take other sedating medications when taking this medicine for safety reasons.  Keep this out of reach of small children.  Please be aware this medicine has Tylenol in it (325 mg/tab) do not exceed the maximum dose of Tylenol in a day per over the counter recommendations should you decide to supplement with Tylenol over the counter.   - Lidoderm patch- Apply 1 patch directly to right lower back daily   We have prescribed you new medication(s) today. Discuss the medications prescribed today with your pharmacist as they can have adverse effects and interactions with your other medicines including over the counter and prescribed  medications. Seek medical evaluation if you start to experience new or abnormal symptoms after taking one of these medicines, seek care immediately if you start to experience difficulty breathing, feeling of your throat closing, facial swelling, or rash as these could be indications of a more serious allergic reaction   Please follow-up with orthopedics and her primary care within 3 days for reassessment.  Return to the emergency department for new or worsening symptoms including but not limited to worsening pain, inability to walk, numbness, weakness, loss of bowel or bladder control, fever, or any other concerns.

## 2019-04-20 NOTE — ED Triage Notes (Signed)
States he was hitching his boat this am and the boat fell and he injuried his back states the pain radiates down his leg and its very painful to walk.

## 2019-04-22 ENCOUNTER — Telehealth: Payer: Self-pay | Admitting: Family Medicine

## 2019-04-22 ENCOUNTER — Telehealth (INDEPENDENT_AMBULATORY_CARE_PROVIDER_SITE_OTHER): Payer: BC Managed Care – PPO | Admitting: Family Medicine

## 2019-04-22 ENCOUNTER — Other Ambulatory Visit: Payer: Self-pay

## 2019-04-22 DIAGNOSIS — M87052 Idiopathic aseptic necrosis of left femur: Secondary | ICD-10-CM | POA: Diagnosis not present

## 2019-04-22 DIAGNOSIS — M87051 Idiopathic aseptic necrosis of right femur: Secondary | ICD-10-CM

## 2019-04-22 DIAGNOSIS — S39012D Strain of muscle, fascia and tendon of lower back, subsequent encounter: Secondary | ICD-10-CM | POA: Diagnosis not present

## 2019-04-22 MED ORDER — OXYCODONE-ACETAMINOPHEN 5-325 MG PO TABS: 1.0000 | ORAL_TABLET | Freq: Four times a day (QID) | ORAL | 0 refills | Status: DC | PRN

## 2019-04-22 NOTE — Progress Notes (Signed)
This visit type was conducted due to national recommendations for restrictions regarding the COVID-19 pandemic in an effort to limit this patient's exposure and mitigate transmission in our community.   Virtual Visit via Video Note  I connected with Jason [name] on 04/22/19 at  4:15 PM EDT by a video enabled telemedicine application and verified that I am speaking with the correct person using two identifiers.  Location patient: home Location provider:work or home office Persons participating in the virtual visit: patient, provider  I discussed the limitations of evaluation and management by telemedicine and the availability of in person appointments. The patient expressed understanding and agreed to proceed.   HPI: Patient recently injured his low back.  Onset occurred on 04/20/2019.  He was hitching his boat when the boat started to move causing a jerking motion with sudden onset of back pain.  No trauma against the ground or any objects.  His pain is mostly middle and right lower back.  No radiculitis symptoms.  His pain was moderate to severe.  He had already tried muscle relaxer without much improvement.  He denied any weakness or numbness.  He went to the ER for further evaluation.  He had CT imaging which showed no acute osseous injury of the lumbar spine.  There was evidence for avascular necrosis of the femoral heads bilaterally and moderate osteoarthritis of the right hip.  He is ambulating without much difficulty now.  He was prescribed naproxen 500 mg twice daily Lidoderm pain patch, and limited Percocet.  He does feel medications have helped somewhat.  He denies any new symptoms.  His concern is pain with going back to work.  He has not had any urine or stool incontinence, weakness, numbness.  Pain is worse with position change.  He also had recent exacerbation of cervical neck pain which has been somewhat chronic and intermittent.  He had brief prednisone taper back from urgent care  over a week ago.  He has now completed that.   ROS: See pertinent positives and negatives per HPI.  Past Medical History:  Diagnosis Date  . Anxiety   . Depression   . Diabetes mellitus without complication (HCC)   . GERD (gastroesophageal reflux disease)   . Headache(784.0)   . Hyperlipidemia   . Hypertension   . MRSA infection    Left shin, back  . Nerve damage    BLE  . Osteoarthritis   . Peripheral vascular disease (HCC)   . Pneumonia     Past Surgical History:  Procedure Laterality Date  . ABDOMINAL AORTOGRAM W/LOWER EXTREMITY N/A 12/01/2017   Procedure: ABDOMINAL AORTOGRAM W/LOWER EXTREMITY;  Surgeon: Sherren KernsFields, Charles E, MD;  Location: [facility];  Service: Cardiovascular;  Laterality: N/A;  . FEMORAL-POPLITEAL BYPASS GRAFT Right 12/11/2017   Procedure: BYPASS GRAFT RIGHT FEMORAL-ABOVE KNEE POPLITEAL ARTERY with Non Reversed Greater Saphenous Vein and Angioplasty distal vein Graft.;  Surgeon: Sherren KernsFields, Charles E, MD;  [facility];  Service: Vascular;  Laterality: Right;  . PERIPHERAL VASCULAR BALLOON ANGIOPLASTY Right 12/01/2017   Procedure: PERIPHERAL VASCULAR BALLOON ANGIOPLASTY;  Surgeon: Sherren KernsFields, Charles E, MD;  Location: [facility];  Service: Cardiovascular;  Laterality: Right;  superficial femoral, attempted  . PERIPHERAL VASCULAR INTERVENTION Left 12/01/2017   Procedure: PERIPHERAL VASCULAR INTERVENTION;  Surgeon: Sherren KernsFields, Charles E, MD;  [facility];  Service: Cardiovascular;  Laterality: Left;  External iliac  . ROTATOR CUFF REPAIR    . TONSILLECTOMY    . VEIN HARVEST  Right 12/11/2017   Procedure: RIGHT GREATER SAPHENOUS VEIN HARVEST;  Surgeon: Elam Dutch, MD;  Location: [facility] OR;  Service: Vascular;  Laterality: Right;    Family History  Problem Relation Age of Onset  . Heart attack Father 41  . Hypertension Father   . Arthritis Mother   . Asthma Maternal Grandfather   . Stroke Paternal Grandfather   . Heart disease Other   .  Mental illness Other   . Cancer Neg Hx   . Diabetes Neg Hx   . Drug abuse Neg Hx   . Early death Neg Hx   . Hearing loss Neg Hx   . Hyperlipidemia Neg Hx     SOCIAL HX: Long history of smoking   Current Outpatient Medications:  .  aspirin EC 325 MG tablet, Take 1 tablet (325 mg total) by mouth daily., Disp: 30 tablet, Rfl: 0 .  atorvastatin (LIPITOR) 40 MG tablet, TAKE 1 TABLET BY MOUTH EVERY DAY, Disp: 90 tablet, Rfl: 0 .  Blood Glucose Monitoring Suppl (CONTOUR BLOOD GLUCOSE SYSTEM) DEVI, Use once daily for diabetes Dx E11.9, Disp: 1 Device, Rfl: 0 .  celecoxib (CELEBREX) 200 MG capsule, TAKE 1 CAPSULE BY MOUTH TWICE A DAY, Disp: 180 capsule, Rfl: 0 .  CONTOUR NEXT TEST test strip, TEST GLUCOSE ONCE DAILY. DX E 11.9, Disp: 100 each, Rfl: 3 .  DULoxetine (CYMBALTA) 60 MG capsule, Take 1 capsule (60 mg total) by mouth daily., Disp: 90 capsule, Rfl: 3 .  empagliflozin (JARDIANCE) 10 MG TABS tablet, Take 10 mg by mouth daily., Disp: 90 tablet, Rfl: 3 .  gabapentin (NEURONTIN) 300 MG capsule, Take 2 capsules (600 mg total) by mouth 2 (two) times daily., Disp: 360 capsule, Rfl: 1 .  Lancets MISC, Contour Lancets.  Test glucose once daily. DxE11.9, Disp: 100 each, Rfl: 3 .  lidocaine (LIDODERM) 5 %, Place 1 patch onto the skin daily. Apply 1 patch to right lower back daily. Remove & Discard patch within 12 hours., Disp: 30 patch, Rfl: 0 .  metFORMIN (GLUCOPHAGE) 500 MG tablet, TAKE TWO TABLETS TWICE DAILY., Disp: 360 tablet, Rfl: 3 .  methocarbamol (ROBAXIN) 500 MG tablet, Take 1 tablet (500 mg total) by mouth at bedtime as needed for muscle spasms., Disp: 30 tablet, Rfl: 1 .  metoprolol succinate (TOPROL-XL) 25 MG 24 hr tablet, Take 1 tablet (25 mg total) by mouth daily., Disp: 90 tablet, Rfl: 2 .  naproxen (NAPROSYN) 500 MG tablet, Take 1 tablet (500 mg total) by mouth 2 (two) times daily., Disp: 10 tablet, Rfl: 0 .  oxyCODONE-acetaminophen (PERCOCET/ROXICET) 5-325 MG tablet, Take 1-2 tablets  by mouth every 6 (six) hours as needed for severe pain., Disp: 20 tablet, Rfl: 0 .  predniSONE (STERAPRED UNI-PAK 21 TAB) 10 MG (21) TBPK tablet, Take by mouth daily. Take steroid taper as written, Disp: 21 tablet, Rfl: 0 .  sitaGLIPtin (JANUVIA) 100 MG tablet, Take 1 tablet (100 mg total) by mouth daily., Disp: 90 tablet, Rfl: 3 .  traZODone (DESYREL) 50 MG tablet, TAKE 1 TABLET BY MOUTH AT BEDTIME AS NEEDED FOR INSOMNIA, Disp: 90 tablet, Rfl: 1  EXAM:  VITALS per patient if applicable:  GENERAL: alert, oriented, appears well and in no acute distress  HEENT: atraumatic, conjunttiva clear, no obvious abnormalities on inspection of external nose and ears  NECK: normal movements of the head and neck  LUNGS: on inspection no signs of respiratory distress, breathing rate appears normal, no obvious gross SOB, gasping or wheezing  CV: no obvious cyanosis  MS: moves all visible extremities without noticeable abnormality  PSYCH/NEURO: pleasant and cooperative, no obvious depression or anxiety, speech and thought processing grossly intact  ASSESSMENT AND PLAN:  Discussed the following assessment and plan:  #1 recent low back strain.  CT did not show any acute disc herniation or acute osseous injury  -We cautioned him about nonsteroidal use with his diabetes history but use naproxen only short-term -Agreed to limited Percocet No. 20 with no refill 1-2 every 6 hours as needed for severe pain -Avoid further prednisone if possible -Heat or ice for symptom relief -Touch base if not improved in 2 weeks.  Consider physical therapy if not improved in 2 weeks  #2 finding on recent imaging of avascular necrosis changes of the hip as well as moderate osteoarthritis of the right hip.  Discussed findings in detail with patient.  He is not having any major problems with ambulation at this time nor any hip pain or major restricted range of motion.  Observe for now.  He is aware this will progress likely  with time   I discussed the assessment and treatment plan with the patient. The patient was provided an opportunity to ask questions and all were answered. The patient agreed with the plan and demonstrated an understanding of the instructions.   The patient was advised to call back or seek an in-person evaluation if the symptoms worsen or if the condition fails to improve as anticipated.   Evelena PeatBruce Burchette, MD

## 2019-04-22 NOTE — Telephone Encounter (Signed)
Medication Refill - Medication: oxyCODONE-acetaminophen (PERCOCET/ROXICET) 5-325 MG tablet  naproxen (NAPROSYN) 500 MG tablet  Pt was seen in the ED for back pain. Was given the above medications by the ED physician and was told to request more from his primary.   Preferred Pharmacy (with phone number or street name):  [id number] [address], [place]. [phone] (Phone) [phone] (Fax)     Agent: Please be advised that RX refills may take up to 3 business days. We ask that you follow-up with your pharmacy.

## 2019-04-22 NOTE — Telephone Encounter (Signed)
Pt will need ED follow up visit to discuss continuation of meds.

## 2019-04-26 ENCOUNTER — Other Ambulatory Visit: Payer: Self-pay | Admitting: Family Medicine

## 2019-04-26 NOTE — Telephone Encounter (Signed)
Are you filling this? Last filled by Dr. Teresa Coombs

## 2019-04-28 NOTE — Telephone Encounter (Signed)
He is no longer with [facility] so refill for one year.

## 2019-06-13 NOTE — Progress Notes (Signed)
Jason [name] [address] [place], [place] [address] Phone: [phone] Subjective:   I Jason [name] am serving as a Neurosurgeon for Dr. Antoine Primas.  I'm seeing this patient by the request  of:    CC: Right hip pain  TWS:[id number]    06/14/19: Jason [name] is a 53 y.o. male coming in with complaint of R hip pain.  He was last seen by Dr. Berline Chough on 11/15/18 and had a prior R hip injection on 10/04/18. Patient states the hip is painful. Right knee is also pain. Recently had a CT scan for his back. Pulled some muscles in his pain.   Reviewed patient's CT of his back.  CT of his back was very mild broad-based disc bulge at L5-S1 but no true nerve impingement.  Patient did have what appeared to be moderate to severe osteoarthritic changes of the right hip with avascular necrosis. Does show some progression from his x-rays back in February 2020.    Past Medical History:  Diagnosis Date  . Anxiety   . Depression   . Diabetes mellitus without complication (HCC)   . GERD (gastroesophageal reflux disease)   . Headache(784.0)   . Hyperlipidemia   . Hypertension   . MRSA infection    Left shin, back  . Nerve damage    BLE  . Osteoarthritis   . Peripheral vascular disease (HCC)   . Pneumonia    Past Surgical History:  Procedure Laterality Date  . ABDOMINAL AORTOGRAM W/LOWER EXTREMITY N/A 12/01/2017   Procedure: ABDOMINAL AORTOGRAM W/LOWER EXTREMITY;  Surgeon: Sherren Kerns, MD;  Location: [facility];  Service: Cardiovascular;  Laterality: N/A;  . FEMORAL-POPLITEAL BYPASS GRAFT Right 12/11/2017   Procedure: BYPASS GRAFT RIGHT FEMORAL-ABOVE KNEE POPLITEAL ARTERY with Non Reversed Greater Saphenous Vein and Angioplasty distal vein Graft.;  Surgeon: Sherren Kerns, MD;  Location[facility];  Service: Vascular;  Laterality: Right;  . PERIPHERAL VASCULAR BALLOON ANGIOPLASTY Right 12/01/2017   Procedure: PERIPHERAL VASCULAR BALLOON ANGIOPLASTY;  Surgeon:  Sherren Kerns, MD;  Location: [facility];  Service: Cardiovascular;  Laterality: Right;  superficial femoral, attempted  . PERIPHERAL VASCULAR INTERVENTION Left 12/01/2017   Procedure: PERIPHERAL VASCULAR INTERVENTION;  Surgeon: Sherren Kerns, MD;  Location: [facility];  Service: Cardiovascular;  Laterality: Left;  External iliac  . ROTATOR CUFF REPAIR    . TONSILLECTOMY    . VEIN HARVEST Right 12/11/2017   Procedure: RIGHT GREATER SAPHENOUS VEIN HARVEST;  Surgeon: Sherren Kerns, MD;  Location: [facility] OR;  Service: Vascular;  Laterality: Right;   Social History   Socioeconomic History  . Marital status: Married    Spouse name: Not on file  . Number of children: Not on file  . Years of education: Not on file  . Highest education level: Not on file  Occupational History  . Not on file  Social Needs  . Financial resource strain: Not on file  . Food insecurity    Worry: Not on file    Inability: Not on file  . Transportation needs    Medical: Not on file    Non-medical: Not on file  Tobacco Use  . Smoking status: Current Every Day Smoker    Packs/day: 2.00    Years: 25.00    Pack years: 50.00    Types: Cigarettes  . Smokeless tobacco: Never Used  . Tobacco comment: 30 cigarettes per day  Substance and Sexual Activity  .  Alcohol use: Not Currently    Alcohol/week: 20.0 standard drinks    Types: 20 Glasses of wine per week    Comment: not since Jan 2018  . Drug use: No  . Sexual activity: Not Currently  Lifestyle  . Physical activity    Days per week: Not on file    Minutes per session: Not on file  . Stress: Not on file  Relationships  . Social Herbalist on phone: Not on file    Gets together: Not on file    Attends religious service: Not on file    Active member of club or organization: Not on file    Attends meetings of clubs or organizations: Not on file    Relationship status: Not on file  Other Topics Concern  . Not on file   Social History Narrative  . Not on file   Allergies  Allergen Reactions  . Olmesartan Medoxomil Other (See Comments)    REACTION: dizziness   Family History  Problem Relation Age of Onset  . Heart attack Father 24  . Hypertension Father   . Arthritis Mother   . Asthma Maternal Grandfather   . Stroke Paternal Grandfather   . Heart disease Other   . Mental illness Other   . Cancer Neg Hx   . Diabetes Neg Hx   . Drug abuse Neg Hx   . Early death Neg Hx   . Hearing loss Neg Hx   . Hyperlipidemia Neg Hx     Current Outpatient Medications (Endocrine & Metabolic):  .  empagliflozin (JARDIANCE) 10 MG TABS tablet, Take 10 mg by mouth daily. .  metFORMIN (GLUCOPHAGE) 500 MG tablet, TAKE TWO TABLETS TWICE DAILY. Marland Kitchen  predniSONE (STERAPRED UNI-PAK 21 TAB) 10 MG (21) TBPK tablet, Take by mouth daily. Take steroid taper as written .  sitaGLIPtin (JANUVIA) 100 MG tablet, Take 1 tablet (100 mg total) by mouth daily.  Current Outpatient Medications (Cardiovascular):  .  atorvastatin (LIPITOR) 40 MG tablet, TAKE 1 TABLET BY MOUTH EVERY DAY .  metoprolol succinate (TOPROL-XL) 25 MG 24 hr tablet, Take 1 tablet (25 mg total) by mouth daily.   Current Outpatient Medications (Analgesics):  .  aspirin EC 325 MG tablet, Take 1 tablet (325 mg total) by mouth daily. .  celecoxib (CELEBREX) 200 MG capsule, TAKE 1 CAPSULE BY MOUTH TWICE A DAY .  naproxen (NAPROSYN) 500 MG tablet, Take 1 tablet (500 mg total) by mouth 2 (two) times daily. Marland Kitchen  oxyCODONE-acetaminophen (PERCOCET/ROXICET) 5-325 MG tablet, Take 1-2 tablets by mouth every 6 (six) hours as needed for severe pain.   Current Outpatient Medications (Other):  .  Blood Glucose Monitoring Suppl (CONTOUR BLOOD GLUCOSE SYSTEM) DEVI, Use once daily for diabetes Dx E11.9 .  CONTOUR NEXT TEST test strip, TEST GLUCOSE ONCE DAILY. DX E 11.9 .  DULoxetine (CYMBALTA) 60 MG capsule, Take 1 capsule (60 mg total) by mouth daily. Marland Kitchen  gabapentin (NEURONTIN) 300  MG capsule, TAKE ONE CAPSULE BY MOUTH EVERY MORNING AND TAKE 2 CAPSULES BY MOUTH AT BEDTIME .  Lancets MISC, Contour Lancets.  Test glucose once daily. DxE11.9 .  lidocaine (LIDODERM) 5 %, Place 1 patch onto the skin daily. Apply 1 patch to right lower back daily. Remove & Discard patch within 12 hours. .  methocarbamol (ROBAXIN) 500 MG tablet, Take 1 tablet (500 mg total) by mouth at bedtime as needed for muscle spasms. .  traZODone (DESYREL) 50 MG tablet, TAKE 1 TABLET  BY MOUTH AT BEDTIME AS NEEDED FOR INSOMNIA .  Vitamin D, Ergocalciferol, (DRISDOL) 1.25 MG (50000 UT) CAPS capsule, Take 1 capsule (50,000 Units total) by mouth every 7 (seven) days.    Past medical history, social, surgical and family history all reviewed in electronic medical record.  No pertanent information unless stated regarding to the chief complaint.   Review of Systems:  No headache, visual changes, nausea, vomiting, diarrhea, constipation, dizziness, abdominal pain, skin rash, fevers, chills, night sweats, weight loss, swollen lymph nodes, body aches, joint swelling,  chest pain, shortness of breath, mood changes.  Positive muscle aches  Objective  Blood pressure 130/60, pulse 86, height 5\' 10"  (1.778 m), weight 217 lb (98.4 kg), SpO2 97 %.   General: No apparent distress alert and oriented x3 mood and affect normal, dressed appropriately.  HEENT: Pupils equal, extraocular movements intact  Respiratory: Patient's speak in full sentences and does not appear short of breath  Cardiovascular: No lower extremity edema, non tender, no erythema  Skin: Warm dry intact with no signs of infection or rash on extremities or on axial skeleton.  Abdomen: Soft nontender  Neuro: Cranial nerves II through XII are intact, neurovascularly intact in all extremities with 2+ DTRs and 2+ pulses.  Lymph: No lymphadenopathy of posterior or anterior cervical chain or axillae bilaterally.  Gait mild antalgic.  MSK:  tender with full range  of motion and good stability and symmetric strength and tone of shoulders, elbows, wrist, and ankles bilaterally.  Right hip exam shows the patient does have decreased range of motion with only 5 degrees of internal rotation.  4 out of 5 strength but symmetric to the contralateral hip.  Patient has limited range of motion with external rotation as well.  Moderately tender to palpation in the groin area.  Contralateral hip shows some mild limited range of motion as well but not as severe.  Knee: Bilateral Varus deformity noted.  Abnormal thigh to calf ratio.  Tender to palpation over medial and PF joint line.  ROM full in flexion and extension and lower leg rotation. No significant instability painful patellar compression. Patellar glide with moderate crepitus. Patellar and quadriceps tendons unremarkable. Hamstring and quadriceps strength is normal. Contralateral knee shows   Impression and Recommendations:     This case required medical decision making of moderate complexity. The above documentation has been reviewed and is accurate and complete Jason SaaZachary M Steele Stracener, DO       Note: This dictation was prepared with Dragon dictation along with smaller phrase technology. Any transcriptional errors that result from this process are unintentional.

## 2019-06-14 ENCOUNTER — Other Ambulatory Visit: Payer: Self-pay

## 2019-06-14 ENCOUNTER — Ambulatory Visit: Payer: BC Managed Care – PPO | Admitting: Family Medicine

## 2019-06-14 ENCOUNTER — Encounter: Payer: Self-pay | Admitting: Family Medicine

## 2019-06-14 DIAGNOSIS — M8700 Idiopathic aseptic necrosis of unspecified bone: Secondary | ICD-10-CM

## 2019-06-14 MED ORDER — VITAMIN D (ERGOCALCIFEROL) 1.25 MG (50000 UNIT) PO CAPS: 50000.0000 [IU] | ORAL_CAPSULE | ORAL | 0 refills | Status: DC

## 2019-06-14 NOTE — Patient Instructions (Addendum)
Good to see you Exercise 3 times a week Avascular necrosis Spenco orthotics "total support" Ice 20 minutes 2 times daily. Usually after activity and before bed. pennsaid pinkie amount topically 2 times daily as needed.  Once weekly vitamin D for 12 weeks.  See me again in 6-8 weeks

## 2019-06-15 ENCOUNTER — Encounter: Payer: Self-pay | Admitting: Family Medicine

## 2019-06-15 DIAGNOSIS — M8700 Idiopathic aseptic necrosis of unspecified bone: Secondary | ICD-10-CM | POA: Insufficient documentation

## 2019-06-15 NOTE — Assessment & Plan Note (Signed)
Avascular necrosis.  This is of the right hip.  Discussed with patient in great length that the knee injection can accelerate the possibility of worsening arthritis in his hip or even potentially a fracture.  Discussed that we can take this chance if he is willing but I would be more likely to do conservative therapy including once weekly vitamin D.  Patient already has pain medications from another provider.  Has many different comorbidities.  Discussed with patient about vitamin D supplementation, home exercise, weight loss, icing regimen.  Patient will try to make the changes and follow-up with me again in 6 to 8 weeks to see how patient is responding.

## 2019-06-17 ENCOUNTER — Other Ambulatory Visit: Payer: Self-pay | Admitting: Family Medicine

## 2019-06-21 NOTE — Telephone Encounter (Signed)
He is due for lipids.  We will also need A1c soon.  Go ahead and refill for 1 month and set up follow-up

## 2019-06-21 NOTE — Telephone Encounter (Signed)
Called pt and he is suppose to schedule an OV in Nov. So he states he will call back to schedule to make sure it works with his schedule. Pt is aware that only #30 will be sent in

## 2019-07-04 ENCOUNTER — Other Ambulatory Visit: Payer: Self-pay

## 2019-07-04 ENCOUNTER — Encounter: Payer: Self-pay | Admitting: Family Medicine

## 2019-07-04 ENCOUNTER — Ambulatory Visit (INDEPENDENT_AMBULATORY_CARE_PROVIDER_SITE_OTHER): Payer: BC Managed Care – PPO

## 2019-07-04 ENCOUNTER — Ambulatory Visit: Payer: BC Managed Care – PPO | Admitting: Family Medicine

## 2019-07-04 VITALS — BP 130/62 | HR 81 | Ht 70.0 in | Wt 221.6 lb

## 2019-07-04 DIAGNOSIS — M25559 Pain in unspecified hip: Secondary | ICD-10-CM

## 2019-07-04 DIAGNOSIS — M25551 Pain in right hip: Secondary | ICD-10-CM | POA: Diagnosis not present

## 2019-07-04 DIAGNOSIS — M8700 Idiopathic aseptic necrosis of unspecified bone: Secondary | ICD-10-CM | POA: Diagnosis not present

## 2019-07-04 MED ORDER — OXYCODONE-ACETAMINOPHEN 5-325 MG PO TABS: 1.0000 | ORAL_TABLET | Freq: Three times a day (TID) | ORAL | 0 refills | Status: DC | PRN

## 2019-07-04 NOTE — Progress Notes (Signed)
I, Jason [name], LAT, ATC, am serving as scribe for Dr. Clementeen GrahamEvan Kamora Vossler.  Jason [name] is a 53 y.o. male who presents to [facility] today for R hip pain.   Patient has a history of hip pain most recently evaluated by Terrilee FilesZach Smith June 14, 2019.  He had findings of AVN bilateral femoral heads as well as moderate to severe DJD right hip seen on CT scan L-spine August 2020.  He was treated conservatively with vitamin D, relative rest.  He was given samples of Pennsaid and a HEP.  In the interim he notes that his R hip is worse.  Pt con't to have constant pain in his R hip, groin, glute and pain radiating into the R thigh down to the knee.  Pt rates his pain at a 7/10 and describes it as sharp.  Pt denies any mechanical symptoms in his R hip or N/T into the R leg.  Aggravating factors include walking and climbing steps or ladders.  Pt con't to take the vit D.  He works as a Transport plannercommercial electrician superintendent.  His job requires lots of walking.    ROS:  As above  Exam:  BP 130/62 (BP Location: Left Arm, Patient Position: Sitting, Cuff Size: Large)    Pulse 81    Ht 5\' 10"  (1.778 m)    Wt 221 lb 9.6 oz (100.5 kg)    SpO2 96%    BMI 31.80 kg/m  Wt Readings from Last 5 Encounters:  07/04/19 221 lb 9.6 oz (100.5 kg)  06/14/19 217 lb (98.4 kg)  04/20/19 212 lb (96.2 kg)  11/15/18 217 lb 12.8 oz (98.8 kg)  10/04/18 217 lb 9.6 oz (98.7 kg)   General: Well Developed, well nourished, and in no acute distress.  Neuro/Psych: Alert and oriented x3, extra-ocular muscles intact, able to move all 4 extremities, sensation grossly intact. Skin: Warm and dry, no rashes noted.  Respiratory: Not using accessory muscles, speaking in full sentences, trachea midline.  Cardiovascular: Pulses palpable, no extremity edema. Abdomen: Does not appear distended. MSK: Right hip: Normal-appearing.  Decreased range of motion.  Nonantalgic gait.    Lab and Radiology Results X-ray images right hip  obtained today personally independently reviewed Significant degenerative changes at right hip from acetabular joint with irregular cortex.  No AVN bone collapse present. Await formal radiology review  Ct Lumbar Spine Wo Contrast  Result Date: 04/20/2019 CLINICAL DATA:  States he was hitching his boat this am and the boat fell and he injured his back states the pain radiates down his leg and its very painful to walk EXAM: CT LUMBAR SPINE WITHOUT CONTRAST TECHNIQUE: Multidetector CT imaging of the lumbar spine was performed without intravenous contrast administration. Multiplanar CT image reconstructions were also generated. COMPARISON:  None. FINDINGS: Segmentation: 5 lumbar type vertebrae. Alignment: Normal. Vertebrae: No acute fracture or focal pathologic process. Other: Mild osteoarthritis of bilateral SI joints. Subchondral serpiginous signal abnormality with associated sclerosis in the superior femoral heads bilaterally consistent with a vascular necrosis. Moderate osteoarthritis of the right hip. Mild osteoarthritis of the left hip. Paraspinal and other soft tissues: No acute paraspinal abnormality. Abdominal aortic atherosclerosis. Visualized bowel demonstrates no focal abnormality. Enlarged prostate gland. Disc levels: Mild degenerative disease with disc height loss at T12-L1. T12-L1: No disc protrusion, foraminal stenosis or central canal stenosis. L1-L2: No disc protrusion, foraminal stenosis or central canal stenosis. L2-L3: No disc protrusion, foraminal stenosis or central canal stenosis. L3-L4: No disc protrusion, foraminal stenosis or  central canal stenosis. Mild bilateral facet arthropathy. L4-L5: Mild broad-based disc bulge. Mild bilateral facet arthropathy. No foraminal or central canal stenosis. L5-S1: Broad-based disc bulge with a small left paracentral disc protrusion. Mild bilateral facet arthropathy. No foraminal stenosis. IMPRESSION: 1.  No acute osseous injury of the lumbar spine. 2.  Avascular necrosis of bilateral femoral heads. Moderate osteoarthritis of the right hip. Mild osteoarthritis of the left hip. Electronically Signed   By: Elige Ko   On: 04/20/2019 12:10   I personally (independently) visualized and performed the interpretation of the images attached in this note.     Assessment and Plan: 53 y.o. male with right hip pain.  Secondary to DJD and likely AVN is seen on CT scan.  At this point steroid injections are not indicated.  He is likely to have no significant lasting pain response and may worsen underlying AVN of the contralateral left hip.  Plan to proceed with limited medicine for pain control including oxycodone which has had benefits for the past.  Additionally will refer to orthopedic surgery.  Patient notes that it may be a few months before he is able to clear up his work schedule to proceed with surgery.  Will discuss with PCP who will take over prescribing oxycodone for pain control until    PDMP reviewed during this encounter. Orders Placed This Encounter  Procedures   DG Hip Unilat W OR W/O Pelvis 2-3 Views Right    Standing Status:   Future    Number of Occurrences:   1    Standing Expiration Date:   09/02/2020    Order Specific Question:   Reason for Exam (SYMPTOM  OR DIAGNOSIS REQUIRED)    Answer:   eval hip pain and AVN    Order Specific Question:   Preferred imaging location?    Answer:   [facility]    Order Specific Question:   Radiology Contrast Protocol - do NOT remove file path    Answer:   [email or web address]   No orders of the defined types were placed in this encounter.   Historical information moved to improve visibility of documentation.  Past Medical History:  Diagnosis Date   Anxiety    Depression    Diabetes mellitus without complication (HCC)    GERD (gastroesophageal reflux disease)    Headache(784.0)    Hyperlipidemia    Hypertension    MRSA infection     Left shin, back   Nerve damage    BLE   Osteoarthritis    Peripheral vascular disease (HCC)    Pneumonia    Past Surgical History:  Procedure Laterality Date   ABDOMINAL AORTOGRAM W/LOWER EXTREMITY N/A 12/01/2017   Procedure: ABDOMINAL AORTOGRAM W/LOWER EXTREMITY;  Surgeon: Sherren Kerns, MD;  Location: [facility];  Service: Cardiovascular;  Laterality: N/A;   FEMORAL-POPLITEAL BYPASS GRAFT Right 12/11/2017   Procedure: BYPASS GRAFT RIGHT FEMORAL-ABOVE KNEE POPLITEAL ARTERY with Non Reversed Greater Saphenous Vein and Angioplasty distal vein Graft.;  Surgeon: Sherren Kerns, MD;  Location: [facility] OR;  Service: Vascular;  Laterality: Right;   PERIPHERAL VASCULAR BALLOON ANGIOPLASTY Right 12/01/2017   Procedure: PERIPHERAL VASCULAR BALLOON ANGIOPLASTY;  Surgeon: Sherren Kerns, MD;  Location: [facility];  Service: Cardiovascular;  Laterality: Right;  superficial femoral, attempted   PERIPHERAL VASCULAR INTERVENTION Left 12/01/2017   Procedure: PERIPHERAL VASCULAR INTERVENTION;  Surgeon: Sherren Kerns, MD;  Location: [facility];  Service: Cardiovascular;  Laterality: Left;  External iliac   ROTATOR CUFF REPAIR     TONSILLECTOMY     VEIN HARVEST Right 12/11/2017   Procedure: RIGHT GREATER SAPHENOUS VEIN HARVEST;  Surgeon: Elam Dutch, MD;  Location: [facility] OR;  Service: Vascular;  Laterality: Right;   Social History   Tobacco Use   Smoking status: Current Every Day Smoker    Packs/day: 2.00    Years: 25.00    Pack years: 50.00    Types: Cigarettes   Smokeless tobacco: Never Used   Tobacco comment: 30 cigarettes per day  Substance Use Topics   Alcohol use: Not Currently    Alcohol/week: 20.0 standard drinks    Types: 20 Glasses of wine per week    Comment: not since Jan 2018   family history includes Arthritis in his mother; Asthma in his maternal grandfather; Heart attack (age of onset: 64) in his father; Heart disease in an other  family member; Hypertension in his father; Mental illness in an other family member; Stroke in his paternal grandfather.  Medications: Current Outpatient Medications  Medication Sig Dispense Refill   aspirin EC 325 MG tablet Take 1 tablet (325 mg total) by mouth daily. 30 tablet 0   atorvastatin (LIPITOR) 40 MG tablet TAKE 1 TABLET BY MOUTH EVERY DAY 30 tablet 0   Blood Glucose Monitoring Suppl (CONTOUR BLOOD GLUCOSE SYSTEM) DEVI Use once daily for diabetes Dx E11.9 1 Device 0   celecoxib (CELEBREX) 200 MG capsule TAKE 1 CAPSULE BY MOUTH TWICE A DAY 60 capsule 0   CONTOUR NEXT TEST test strip TEST GLUCOSE ONCE DAILY. DX E 11.9 100 each 3   DULoxetine (CYMBALTA) 60 MG capsule Take 1 capsule (60 mg total) by mouth daily. 90 capsule 3   empagliflozin (JARDIANCE) 10 MG TABS tablet Take 10 mg by mouth daily. 90 tablet 3   gabapentin (NEURONTIN) 300 MG capsule TAKE ONE CAPSULE BY MOUTH EVERY MORNING AND TAKE 2 CAPSULES BY MOUTH AT BEDTIME 270 capsule 3   Lancets MISC Contour Lancets.  Test glucose once daily. DxE11.9 100 each 3   lidocaine (LIDODERM) 5 % Place 1 patch onto the skin daily. Apply 1 patch to right lower back daily. Remove & Discard patch within 12 hours. 30 patch 0   metFORMIN (GLUCOPHAGE) 500 MG tablet TAKE TWO TABLETS TWICE DAILY. 360 tablet 3   methocarbamol (ROBAXIN) 500 MG tablet Take 1 tablet (500 mg total) by mouth at bedtime as needed for muscle spasms. 30 tablet 1   metoprolol succinate (TOPROL-XL) 25 MG 24 hr tablet Take 1 tablet (25 mg total) by mouth daily. 90 tablet 2   naproxen (NAPROSYN) 500 MG tablet Take 1 tablet (500 mg total) by mouth 2 (two) times daily. 10 tablet 0   sitaGLIPtin (JANUVIA) 100 MG tablet Take 1 tablet (100 mg total) by mouth daily. 90 tablet 3   traZODone (DESYREL) 50 MG tablet TAKE 1 TABLET BY MOUTH AT BEDTIME AS NEEDED FOR INSOMNIA 90 tablet 1   Vitamin D, Ergocalciferol, (DRISDOL) 1.25 MG (50000 UT) CAPS capsule Take 1 capsule  (50,000 Units total) by mouth every 7 (seven) days. 12 capsule 0   oxyCODONE-acetaminophen (PERCOCET/ROXICET) 5-325 MG tablet Take 1-2 tablets by mouth every 6 (six) hours as needed for severe pain. (Patient not taking: Reported on 07/04/2019) 20 tablet 0   predniSONE (STERAPRED UNI-PAK 21 TAB) 10 MG (21) TBPK tablet Take by mouth daily. Take steroid taper as written (Patient not taking: Reported on 07/04/2019) 21 tablet 0  No current facility-administered medications for this visit.    Allergies  Allergen Reactions   Olmesartan Medoxomil Other (See Comments)    REACTION: dizziness      Discussed warning signs or symptoms. Please see discharge instructions. Patient expresses understanding.  The above documentation has been reviewed and is accurate and complete Clementeen Graham

## 2019-07-04 NOTE — Patient Instructions (Addendum)
Thank you for coming in today. Use the medicine for pain sparingly.   You should hear about referral to orthopedic surgery soon.  Let me know if you have any problems.  Goal to use infrequent medicine for pain.   Recheck as needed.  Keep me updated.   I think you have Avascular Necrosis of the Hip and Hip arthritis.    Avascular Necrosis Avascular necrosis is death of bone tissue due to lack of blood supply. This condition may also be called:  Osteonecrosis.  Aseptic necrosis.  Ischemic bone necrosis. Without proper blood supply, the internal layer of the affected bone dies. Over time, small breaks form in the outer layer of the bone. If this process affects a bone near a joint, the joint may collapse or move out of place (become dislocated). Joints that may be affected include the jaw, wrist, fingers, knee, and foot. Avascular necrosis most commonly affects:  The hip joint, especially the top of the thigh bone (femoral head).  The top of the upper arm bone (humeral head). What are the causes? This condition may be caused by:  Damage or injury to a bone or joint.  Using steroid medicine such as prednisone for a long time.  Changes in the body's disease-fighting system (immune system).  Changes in the body's system of chemicals that regulate body processes (hormones).  A lot of exposure to radiation. What increases the risk? The following factors may make you more likely to develop this condition:  Alcohol abuse.  A history of joint injury.  Long-term or frequent use of steroid medicines.  Having a medical condition such as: ? HIV or AIDS. ? Diabetes. ? Sickle cell disease. ? A disease in which your body's immune system attacks your body's own healthy tissues (autoimmune disease). What are the signs or symptoms? The main symptoms of avascular necrosis are:  Pain. If an affected joint collapses, the pain may suddenly get severe.  Decreased ability to move the  affected bone or joint. How is this diagnosed? Avascular necrosis may be diagnosed based on:  Your symptoms and medical history.  A physical exam.  Imaging tests, such as X-rays, bone scans, and an MRI. How is this treated? Treatment for this condition may include:  Pain medicine, such as NSAIDs.  Medicine to improve bone growth.  Avoiding placing any pressure or weight on the affected area. If avascular necrosis occurs in your hip, ankle, or foot, you may need to use a device to help you move around (assistive device), such as crutches or a rolling scooter.  Physical therapy to help regain strength and motion in the affected area.  Surgery, such as: ? Core decompression. In this surgery, one or more holes are placed in the bone for new blood vessels to grow into. This provides a renewed blood supply to the bone. This surgery may reduce pain and pressure in the affected bone and slow the destruction of bones and joints. ? Osteotomy. In this surgery, the bone is reshaped to reduce stress on the affected area of the joint. ? Bone grafting. In this surgery, healthy bone from a different part of your body is used to replace damaged bone. ? Total joint replacement (arthroplasty). In this surgery, the affected surfaces of bone on one or both sides of a joint are replaced with artificial parts (prostheses).  Electrical stimulation (also called e-stim). During this procedure, a probe over the skin sends shock waves into the body. This may help to encourage new  bone growth.  High-pressure oxygen therapy (hyperbaric oxygen). This is rarely used. Follow these instructions at home: Activity  Ask your health care provider what activities are safe for you.  If physical therapy was prescribed, do exercises as told by your health care provider.  Avoid placing any pressure or weight on the affected area, as told by your health care provider. Use assistive devices as instructed. Managing pain,  stiffness, and swelling  If directed, apply heat to the affected area as often as told by your health care provider. Heat can reduce the stiffness of your muscles and joints. Use the heat source that your health care provider recommends, such as a moist heat pack or a heating pad. ? Place a towel between your skin and the heat source. ? Leave the heat on for 20-30 minutes. ? Remove the heat if your skin turns bright red. This is especially important if you are unable to feel pain, heat, or cold. You may have a greater risk of getting burned.  If directed, put ice on affected areas. Icing can help to relieve joint pain and swelling. ? Put ice in a plastic bag. ? Place a towel between your skin and the bag. ? Leave the ice on for 20 minutes, 2-3 times a day. General instructions   Take over-the-counter and prescription medicines only as told by your health care provider.  Do not drive or use heavy machinery while taking prescription pain medicine.  If a bone in your arm or leg is affected, ask your health care provider if it is safe for you to drive.  Do not use any products that contain nicotine or tobacco, such as cigarettes and e-cigarettes. These can delay bone healing. If you need help quitting, ask your health care provider.  Do not drink alcohol.  Keep all follow-up visits as told by your health care provider. This is important. Contact a health care provider if:  You have pain that gets worse or does not get better with medicine.  Your ability to move your joint gets worse. Get help right away if:  Your pain suddenly becomes severe. Summary  Avascular necrosis is death of bone tissue due to a lack of blood supply.  Without proper blood supply, the internal layer of the affected bone dies. Over time, small breaks form in the outer layer of the bone.  Avoid putting any pressure or weight on the affected area. You may need to use a device to help you move around (assistive  device), such as crutches or a rolling scooter. This information is not intended to replace advice given to you by your health care provider. Make sure you discuss any questions you have with your health care provider. Document Released: 02/04/2002 Document Revised: 07/28/2017 Document Reviewed: 07/25/2017 Elsevier Patient Education  2020 [employer].

## 2019-07-05 NOTE — Progress Notes (Signed)
X-ray hip shows arthritis and vascular necrosis.  Proceed with hip replacement as soon as possible.

## 2019-07-09 ENCOUNTER — Telehealth: Payer: Self-pay

## 2019-07-09 NOTE — Telephone Encounter (Signed)
Spoke with pt and he agreed to an appointment. He wanted to come in on Friday. Appointment was made.

## 2019-07-09 NOTE — Telephone Encounter (Signed)
-----   Message from Eulas Post, MD sent at 07/08/2019  8:08 AM EST ----- Regarding: FW: Pain med This patient will need to be scheduled for office visit to discuss hip pain management options.  This week or next OK.   ----- Message ----- From: Gregor Hams, MD Sent: 07/04/2019   2:50 PM EST To: Eulas Post, MD Subject: Pain med                                       I saw your patient Jason [name] today.  He has hip pain due to DJD and AVN.  I think ultimately he is going to need a hip replacement probably sooner rather than later.  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  I prescribed him 30 days worth of oxycodone as he had benefit from the last prescription that you wrote of this in August.  Ideally I should not be prescribing chronic pain medication.  Let us figure out a good approach to work going forward for his pain needs.  I am open to suggestions. What you think?

## 2019-07-12 ENCOUNTER — Encounter: Payer: Self-pay | Admitting: Family Medicine

## 2019-07-12 ENCOUNTER — Other Ambulatory Visit: Payer: Self-pay

## 2019-07-12 ENCOUNTER — Encounter: Payer: Self-pay | Admitting: Orthopaedic Surgery

## 2019-07-12 ENCOUNTER — Ambulatory Visit: Payer: BC Managed Care – PPO | Admitting: Family Medicine

## 2019-07-12 ENCOUNTER — Ambulatory Visit: Payer: BC Managed Care – PPO | Admitting: Orthopaedic Surgery

## 2019-07-12 ENCOUNTER — Ambulatory Visit: Payer: Self-pay

## 2019-07-12 VITALS — BP 126/76 | HR 85 | Temp 98.3°F | Resp 16 | Ht 70.0 in | Wt 222.6 lb

## 2019-07-12 VITALS — Ht 70.0 in | Wt 220.0 lb

## 2019-07-12 DIAGNOSIS — M1611 Unilateral primary osteoarthritis, right hip: Secondary | ICD-10-CM | POA: Insufficient documentation

## 2019-07-12 DIAGNOSIS — M8700 Idiopathic aseptic necrosis of unspecified bone: Secondary | ICD-10-CM

## 2019-07-12 DIAGNOSIS — E1165 Type 2 diabetes mellitus with hyperglycemia: Secondary | ICD-10-CM

## 2019-07-12 LAB — POCT GLYCOSYLATED HEMOGLOBIN (HGB A1C): Hemoglobin A1C: 7.2 % — AB (ref 4.0–5.6)

## 2019-07-12 MED ORDER — OXYCODONE-ACETAMINOPHEN 5-325 MG PO TABS: 1.0000 | ORAL_TABLET | Freq: Three times a day (TID) | ORAL | 0 refills | Status: DC | PRN

## 2019-07-12 NOTE — Progress Notes (Signed)
Subjective: Patient is here for ultrasound-guided intra-articular right hip injection.   End-stage DJD with AVN.  Needs to work on smoking cessation prior to surgery.  Objective: Pain and limited range of motion was hip flexion and internal rotation.  Procedure: Ultrasound-guided right hip injection: After sterile prep with Betadine, injected 8 cc 1% lidocaine without epinephrine and 40 mg methylprednisolone using a 22-gauge spinal needle, passing the needle through the iliofemoral ligament into the femoral head/neck junction.  Injectate was seen filling the joint capsule.  Good immediate relief.  Follow-up as directed.

## 2019-07-12 NOTE — Progress Notes (Signed)
Subjective:     Patient ID: Jason [name], male   DOB: 1966/02/21, 53 y.o.   MRN: [id number]  HPI Jason [name] is here to discuss right hip pain.  He has seen couple different sports medicine doctors and has been diagnosed with avascular necrosis.  He was referred from sports medicine to orthopedics and reportedly had injection of the hip earlier today.  He knows he will likely need hip surgery fairly soon but he is adamant that he cannot skip work until January.  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  His pain has been severe at times and frequently radiates into the groin.  He also has considerable night pain.  He was given prescription for oxycodone 5/325 mg #30 and has been taking 1 every 8 hours and this is helping him to get through the day.  He still has some breakthrough pain but is much more functional with this dosage.  He has not gotten relief with nonsteroidal such as Aleve or Advil.  We had received note from sports medicine that he would need some ongoing pain management control until he can get surgery and we brought him back for discussion today.  He does have remote history of alcohol abuse but has never had any abuse of illicit drugs or opioids.  He has been completely abstinent from alcohol for over 2 years.  No illicit drugs.  When he saw surgeon today they had mentioned they wanted to see tighter control diabetes as well as smoking cessation before any hip replacement surgery.  His last A1c prior today was 8.  We added Januvia recently and A1c today is down to 7.2%.  Past Medical History:  Diagnosis Date  . Anxiety   . Depression   . Diabetes mellitus without complication ([place])   . GERD (gastroesophageal reflux disease)   . Headache(784.0)   . Hyperlipidemia   . Hypertension   . MRSA infection    Left shin, back  . Nerve damage    BLE  . Osteoarthritis   . Peripheral vascular disease ([place])   . Pneumonia    Past Surgical History:  Procedure  Laterality Date  . ABDOMINAL AORTOGRAM W/LOWER EXTREMITY N/A 12/01/2017   Procedure: ABDOMINAL AORTOGRAM W/LOWER EXTREMITY;  Surgeon: Elam Dutch, MD;  Location: [place] [facility];  Service: Cardiovascular;  Laterality: N/A;  . FEMORAL-POPLITEAL BYPASS GRAFT Right 12/11/2017   Procedure: BYPASS GRAFT RIGHT FEMORAL-ABOVE KNEE POPLITEAL ARTERY with Non Reversed Greater Saphenous Vein and Angioplasty distal vein Graft.;  Surgeon: Elam Dutch, MD;  [facility];  Service: Vascular;  Laterality: Right;  . PERIPHERAL VASCULAR BALLOON ANGIOPLASTY Right 12/01/2017   Procedure: PERIPHERAL VASCULAR BALLOON ANGIOPLASTY;  Surgeon: Elam Dutch, MD;  Location: [place] [facility];  Service: Cardiovascular;  Laterality: Right;  superficial femoral, attempted  . PERIPHERAL VASCULAR INTERVENTION Left 12/01/2017   Procedure: PERIPHERAL VASCULAR INTERVENTION;  Surgeon: Elam Dutch, MD;  Location: [place] [facility];  Service: Cardiovascular;  Laterality: Left;  External iliac  . ROTATOR CUFF REPAIR    . TONSILLECTOMY    . VEIN HARVEST Right 12/11/2017   Procedure: RIGHT GREATER SAPHENOUS VEIN HARVEST;  Surgeon: Elam Dutch, MD;  Location: [place];  Service: Vascular;  Laterality: Right;    reports that he has been smoking cigarettes. He has a 50.00 pack-year smoking history. He has never used smokeless tobacco. He reports previous alcohol use of about 20.0 standard drinks of alcohol per  week. He reports that he does not use drugs. family history includes Arthritis in his mother; Asthma in his maternal grandfather; Heart attack (age of onset: 23) in his father; Heart disease in an other family member; Hypertension in his father; Mental illness in an other family member; Stroke in his paternal grandfather. Allergies  Allergen Reactions  . Olmesartan Medoxomil Other (See Comments)    REACTION: dizziness   .pain  Review of Systems  Constitutional: Negative for chills, fatigue and fever.   Eyes: Negative for visual disturbance.  Respiratory: Negative for cough, chest tightness and shortness of breath.   Cardiovascular: Negative for chest pain, palpitations and leg swelling.  Endocrine: Negative for polydipsia and polyuria.  Musculoskeletal: Positive for arthralgias.  Neurological: Negative for dizziness, syncope, weakness, light-headedness and headaches.       Objective:   Physical Exam Constitutional:      Appearance: He is well-developed.  HENT:     Right Ear: External ear normal.     Left Ear: External ear normal.  Eyes:     Pupils: Pupils are equal, round, and reactive to light.  Neck:     Musculoskeletal: Neck supple.     Thyroid: No thyromegaly.  Cardiovascular:     Rate and Rhythm: Normal rate and regular rhythm.  Pulmonary:     Effort: Pulmonary effort is normal. No respiratory distress.     Breath sounds: Normal breath sounds. No wheezing or rales.  Neurological:     Mental Status: He is alert and oriented to person, place, and time.        Assessment:     #1 chronic right hip pain poorly controlled with history of avascular necrosis.  Uncontrolled with OTC medications.  #2 type 2 diabetes improving with A1c today 7.2%    Plan:     -Long discussion with patient regarding pain control options.  He has not gotten good relief with over-the-counter medications.  We have had some concerns because of alcohol abuse history though he has not had any history of medication abuse. -We agreed to refill his oxycodone 5/325 mg 1 every 8 hours with 1 month supply giving him 90 to last 30 days.  Drug contract reviewed with pt and signed.  We reviewed requirements from [facility] medical board regarding ongoing opioid use and have also discussed our clear goals that he would discontinue opioids and be tapered off gradually after his surgery -Patient had requested referral to another orthopedic surgery group and we will take care of setting that up for  him  [name] MD [facility]

## 2019-07-12 NOTE — Progress Notes (Signed)
Office Visit Note   Patient: Jason [name]           Date of Birth: 12/07/1965           MRN: [id number] Visit Date: 07/12/2019              Requested by: Rodolph Bongorey, Evan S, MD [address] [place] [id number] PCP: Kristian CoveyBurchette, Bruce W, MD   Assessment & Plan: Visit Diagnoses:  1. Primary osteoarthritis of right hip     Plan: My impression is end-stage right hip DJD from AVN.  X-rays were reviewed with the patient in detail today.  We had a lengthy discussion with the patient and his wife and his excessive tobacco use which is currently 2 packs/day.  This puts him at a significant risk for postoperative complications namely infection, DVT, respiratory failure.  His A1c appears to be trending in the right direction and he is due for a repeat level this afternoon at PCP office.  Based on our discussion today we have agreed to proceed with a right hip injection to give him some temporary relief so that this would hopefully help facilitate tobacco cessation or at least minimization.  Patient instructed to follow-up as needed.  Total face to face encounter time was greater than 45 minutes and over half of this time was spent in counseling and/or coordination of care.  Follow-Up Instructions: Return if symptoms worsen or fail to improve.   Orders:  Orders Placed This Encounter  Procedures  . US Guided Needle Placement - No Linked Charges   No orders of the defined types were placed in this encounter.     Procedures: No procedures performed   Clinical Data: No additional findings.   Subjective: Chief Complaint  Patient presents with  . Right Hip - Pain    Patient is a very pleasant 53 year old gentleman who is accompanied by his wife today who comes in for evaluation of chronic right hip pain for a year.  He endorses severe groin and thigh and deep-seated hip pain.  He has pain with walking or with carrying anything heavy.  He works as an Personnel officerelectrician.  He  does have a past history of alcohol abuse.  He has been sober for a few years now.   Review of Systems  Constitutional: Negative.   All other systems reviewed and are negative.    Objective: Vital Signs: Ht 5\' 10"  (1.778 m)   Wt 220 lb (99.8 kg)   BMI 31.57 kg/m   Physical Exam Vitals signs and nursing note reviewed.  Constitutional:      Appearance: He is well-developed.  HENT:     Head: Normocephalic and atraumatic.  Eyes:     Pupils: Pupils are equal, round, and reactive to light.  Neck:     Musculoskeletal: Neck supple.  Pulmonary:     Effort: Pulmonary effort is normal.  Abdominal:     Palpations: Abdomen is soft.  Musculoskeletal: Normal range of motion.  Skin:    General: Skin is warm.  Neurological:     Mental Status: He is alert and oriented to person, place, and time.  Psychiatric:        Behavior: Behavior normal.        Thought Content: Thought content normal.        Judgment: Judgment normal.     Ortho Exam Right hip exam shows limited internal and external rotation due to pain.  Positive FADIR.  Positive  Stinchfield.  Positive logroll.  Negative sciatic tension.  No tenderness palpation.  Specialty Comments:  No specialty comments available.  Imaging: US Guided Needle Placement - No Linked Charges  Result Date: 07/12/2019 Please see Notes tab for imaging impression.    PMFS History: Patient Active Problem List   Diagnosis Date Noted  . Primary osteoarthritis of right hip 07/12/2019  . Avascular necrosis ([place]) 06/15/2019  . PAD (peripheral artery disease) ([place]) 12/11/2017  . Preoperative cardiovascular examination 12/05/2017  . Peripheral arterial disease ([place]) 10/23/2017  . History of MRSA infection 10/23/2017  . Intermittent claudication ([place]) 10/17/2017  . Herniated cervical intervertebral disc 08/01/2017  . Osteoarthritis of spine 08/01/2017  . Poorly controlled type 2 diabetes mellitus with circulatory disorder ([place]) 07/11/2017  .  Polyarthralgia 06/27/2017  . Neck pain 06/27/2017  . Primary osteoarthritis of both knees 05/02/2017  . Sensory neuronopathy 01/14/2017  . Transaminasemia 10/10/2016  . Vitamin B 12 deficiency 09/19/2016  . Hyperglycemia 09/19/2016  . Chest pressure 07/04/2012  . Cough 07/04/2012  . Hyperlipidemia 10/12/2010  . ANXIETY 10/12/2010  . History of alcohol abuse 10/12/2010  . TOBACCO USE 10/12/2010  . DEPRESSION 10/12/2010  . Essential hypertension, benign 10/12/2010  . GERD 10/12/2010  . Osteoarthritis 10/12/2010  . Sleep apnea 10/12/2010   Past Medical History:  Diagnosis Date  . Anxiety   . Depression   . Diabetes mellitus without complication ([place])   . GERD (gastroesophageal reflux disease)   . Headache(784.0)   . Hyperlipidemia   . Hypertension   . MRSA infection    Left shin, back  . Nerve damage    BLE  . Osteoarthritis   . Peripheral vascular disease ([place])   . Pneumonia     Family History  Problem Relation Age of Onset  . Heart attack Father 71  . Hypertension Father   . Arthritis Mother   . Asthma Maternal Grandfather   . Stroke Paternal Grandfather   . Heart disease Other   . Mental illness Other   . Cancer Neg Hx   . Diabetes Neg Hx   . Drug abuse Neg Hx   . Early death Neg Hx   . Hearing loss Neg Hx   . Hyperlipidemia Neg Hx     Past Surgical History:  Procedure Laterality Date  . ABDOMINAL AORTOGRAM W/LOWER EXTREMITY N/A 12/01/2017   Procedure: ABDOMINAL AORTOGRAM W/LOWER EXTREMITY;  Surgeon: Elam Dutch, MD;  Location: [place] [facility];  Service: Cardiovascular;  Laterality: N/A;  . FEMORAL-POPLITEAL BYPASS GRAFT Right 12/11/2017   Procedure: BYPASS GRAFT RIGHT FEMORAL-ABOVE KNEE POPLITEAL ARTERY with Non Reversed Greater Saphenous Vein and Angioplasty distal vein Graft.;  Surgeon: Elam Dutch, MD;  Location: [facility] OR;  Service: Vascular;  Laterality: Right;  . PERIPHERAL VASCULAR BALLOON ANGIOPLASTY Right 12/01/2017   Procedure: PERIPHERAL  VASCULAR BALLOON ANGIOPLASTY;  Surgeon: Elam Dutch, MD;  Location: [place] [facility];  Service: Cardiovascular;  Laterality: Right;  superficial femoral, attempted  . PERIPHERAL VASCULAR INTERVENTION Left 12/01/2017   Procedure: PERIPHERAL VASCULAR INTERVENTION;  Surgeon: Elam Dutch, MD;  Location: [place] [facility];  Service: Cardiovascular;  Laterality: Left;  External iliac  . ROTATOR CUFF REPAIR    . TONSILLECTOMY    . VEIN HARVEST Right 12/11/2017   Procedure: RIGHT GREATER SAPHENOUS VEIN HARVEST;  Surgeon: Elam Dutch, MD;  [facility];  Service: Vascular;  Laterality: Right;   Social History   Occupational History  . Not on file  Tobacco Use  . Smoking status: Current Every Day Smoker    Packs/day: 2.00    Years: 25.00    Pack years: 50.00    Types: Cigarettes  . Smokeless tobacco: Never Used  . Tobacco comment: 30 cigarettes per day  Substance and Sexual Activity  . Alcohol use: Not Currently    Alcohol/week: 20.0 standard drinks    Types: 20 Glasses of wine per week    Comment: not since Jan 2018  . Drug use: No  . Sexual activity: Not Currently

## 2019-07-17 ENCOUNTER — Other Ambulatory Visit: Payer: Self-pay | Admitting: Family Medicine

## 2019-07-23 ENCOUNTER — Encounter: Payer: Self-pay | Admitting: Family Medicine

## 2019-07-24 DIAGNOSIS — U071 COVID-19: Secondary | ICD-10-CM | POA: Diagnosis not present

## 2019-07-24 DIAGNOSIS — Z20828 Contact with and (suspected) exposure to other viral communicable diseases: Secondary | ICD-10-CM | POA: Diagnosis not present

## 2019-08-02 ENCOUNTER — Ambulatory Visit: Payer: BC Managed Care – PPO | Admitting: Family Medicine

## 2019-08-05 ENCOUNTER — Other Ambulatory Visit: Payer: Self-pay | Admitting: Family Medicine

## 2019-08-05 NOTE — Telephone Encounter (Signed)
Medication Refill - Medication:  oxyCODONE-acetaminophen (PERCOCET/ROXICET) 5-325 MG tablet   Has the patient contacted their pharmacy?  Yes advised to call office.  Preferred Pharmacy (with phone number or street name):  [id number] - [place], [place] - [address] [phone] (Phone) [phone] (Fax)     Agent: Please be advised that RX refills may take up to 3 business days. We ask that you follow-up with your pharmacy.  

## 2019-08-05 NOTE — Telephone Encounter (Signed)
Requested medication (s) are due for refill today: yes  Requested medication (s) are on the active medication list: yes  Last refill:  07/12/2019  Future visit scheduled: no  Notes to clinic:  Refill cannot be delegated   Requested Prescriptions  Pending Prescriptions Disp Refills   oxyCODONE-acetaminophen (PERCOCET/ROXICET) 5-325 MG tablet 90 tablet 0    Sig: Take 1 tablet by mouth every 8 (eight) hours as needed.     Not Delegated - Analgesics:  Opioid Agonist Combinations Failed - 08/05/2019  9:44 AM      Failed - This refill cannot be delegated      Failed - Urine Drug Screen completed in last 360 days.      Passed - Valid encounter within last 6 months    Recent Outpatient Visits          3 weeks ago Type 2 diabetes mellitus with hyperglycemia, without long-term current use of insulin ([place])   [place] [facility], Alinda Sierras, MD   1 month ago Hip pain   [facility], Rebekah [place], MD   1 month ago Avascular necrosis [facility])   [place], [place], DO   3 months ago Strain of lumbar region, subsequent Education administrator at [facility], Alinda Sierras, MD   4 months ago Poorly controlled type 2 diabetes mellitus with circulatory disorder [facility])   Therapist, music at [facility], Alinda Sierras, MD

## 2019-08-06 MED ORDER — OXYCODONE-ACETAMINOPHEN 5-325 MG PO TABS: 1.0000 | ORAL_TABLET | Freq: Three times a day (TID) | ORAL | 0 refills | Status: DC | PRN

## 2019-08-09 ENCOUNTER — Telehealth: Payer: Self-pay | Admitting: *Deleted

## 2019-08-09 NOTE — Telephone Encounter (Signed)
Copied from [place] [id number]. Topic: General - Other >> Aug 08, 2019  3:49 PM Rainey Pines A wrote: Patient requesting callback from nurse in regards to his pain medication earliest fill date of 08/10/2019 being changed .Please advise

## 2019-08-09 NOTE — Telephone Encounter (Signed)
Called patient and he stated that he is not able to pick up his Oxy until tomorrow and he stated that it is not even helping the pain anymore. He is going to see the surgeon on 09/06/19 and he stated that his right foot has no use anymore and he is walking like a duck. Patient wants an injection and he has had 2 other doctors refuse an injection until he can see surgeon.  Patient is asking for something to help him get relief. He said he can barely move anymore.

## 2019-08-12 ENCOUNTER — Other Ambulatory Visit: Payer: Self-pay

## 2019-08-12 DIAGNOSIS — M25559 Pain in unspecified hip: Secondary | ICD-10-CM

## 2019-08-12 DIAGNOSIS — M87052 Idiopathic aseptic necrosis of left femur: Secondary | ICD-10-CM

## 2019-08-12 DIAGNOSIS — M87051 Idiopathic aseptic necrosis of right femur: Secondary | ICD-10-CM

## 2019-08-12 DIAGNOSIS — M8700 Idiopathic aseptic necrosis of unspecified bone: Secondary | ICD-10-CM

## 2019-08-12 NOTE — Telephone Encounter (Signed)
I am happy to refer to pain management if he would like.   I know he is eventually looking at surgery which will hopefully help him get off the opioids but in the meantime if requiring higher doses will need to look at pain management.

## 2019-08-12 NOTE — Telephone Encounter (Signed)
Called patient and gave him message from Dr. Elease Hashimoto. Patient stated that he needs something for the sharp pain. Patient agreed for referral to pain management and I have placed referral and sent message to help to get asap. Patient verbalized an understanding.

## 2019-08-14 ENCOUNTER — Other Ambulatory Visit: Payer: Self-pay | Admitting: Family Medicine

## 2019-08-15 ENCOUNTER — Other Ambulatory Visit: Payer: Self-pay | Admitting: Family Medicine

## 2019-08-19 ENCOUNTER — Other Ambulatory Visit: Payer: Self-pay | Admitting: Family Medicine

## 2019-08-19 NOTE — Telephone Encounter (Signed)
Pain management referral placed on 08/12/19, will take 3-4 weeks before they contact patient. OK to continue this for patient?

## 2019-08-19 NOTE — Telephone Encounter (Signed)
Refill OK

## 2019-09-03 ENCOUNTER — Other Ambulatory Visit: Payer: Self-pay | Admitting: Family Medicine

## 2019-09-06 DIAGNOSIS — M25551 Pain in right hip: Secondary | ICD-10-CM | POA: Diagnosis not present

## 2019-09-06 DIAGNOSIS — M1611 Unilateral primary osteoarthritis, right hip: Secondary | ICD-10-CM | POA: Diagnosis not present

## 2019-09-10 ENCOUNTER — Other Ambulatory Visit: Payer: Self-pay | Admitting: Family Medicine

## 2019-09-10 MED ORDER — OXYCODONE-ACETAMINOPHEN 5-325 MG PO TABS: 1.0000 | ORAL_TABLET | Freq: Three times a day (TID) | ORAL | 0 refills | Status: DC | PRN

## 2019-09-13 ENCOUNTER — Other Ambulatory Visit: Payer: Self-pay | Admitting: Family Medicine

## 2019-09-13 ENCOUNTER — Telehealth: Payer: Self-pay

## 2019-09-13 NOTE — Telephone Encounter (Signed)
Called patient and left a detailed voice message for patient to call back to set up a 30 minute pre-op appointment with Dr. Caryl Never. Please come fasting for labs.  OK for [facility] to discuss/advise/and schedule patient for a 30 minute in office pre-op appointment with Dr. Caryl Never.  CRM Created.

## 2019-09-14 ENCOUNTER — Other Ambulatory Visit: Payer: Self-pay | Admitting: Family Medicine

## 2019-09-17 NOTE — Telephone Encounter (Signed)
Refill once 

## 2019-09-17 NOTE — Telephone Encounter (Signed)
Okay to continue this medication? 

## 2019-09-19 ENCOUNTER — Other Ambulatory Visit: Payer: Self-pay

## 2019-09-20 ENCOUNTER — Encounter: Payer: Self-pay | Admitting: Family Medicine

## 2019-09-20 ENCOUNTER — Ambulatory Visit: Payer: BC Managed Care – PPO | Admitting: Family Medicine

## 2019-09-20 VITALS — BP 120/82 | HR 68 | Temp 98.0°F | Ht 70.0 in | Wt 222.1 lb

## 2019-09-20 DIAGNOSIS — Z01818 Encounter for other preprocedural examination: Secondary | ICD-10-CM

## 2019-09-20 DIAGNOSIS — E1165 Type 2 diabetes mellitus with hyperglycemia: Secondary | ICD-10-CM

## 2019-09-20 DIAGNOSIS — E7801 Familial hypercholesterolemia: Secondary | ICD-10-CM

## 2019-09-20 DIAGNOSIS — E1159 Type 2 diabetes mellitus with other circulatory complications: Secondary | ICD-10-CM

## 2019-09-20 LAB — POCT GLYCOSYLATED HEMOGLOBIN (HGB A1C): Hemoglobin A1C: 8.2 % — AB (ref 4.0–5.6)

## 2019-09-20 NOTE — Progress Notes (Signed)
Subjective:     Patient ID: Jason [name], male   DOB: 26-Nov-1965, 54 y.o.   MRN: [id number]  HPI   Jason [name] is seen for preoperative clearance.  He has history of avascular necrosis of the hip and is planning to get total hip replacement February 9.  He has been taking oxycodone but this has not been controlling his pain very well.  He is recently doubled up on his medication.  His other problems include history of hypertension, peripheral vascular disease, type 2 diabetes, hyperlipidemia, past history of alcohol abuse, history of nicotine use  He had previous femoral-popliteal bypass graft and claudication symptoms have greatly improved since then.  He does not have any known history of coronary disease.  He had nuclear stress test February 2018 with ejection fraction of 45% and considered low risk study.  Denies any recent cough, fever, chills, shortness of breath A1c was 7.2%.  He remains on Metformin and Jardiance He has hyperlipidemia treated with statin and is overdue for labs  Past Medical History:  Diagnosis Date  . Anxiety   . Depression   . Diabetes mellitus without complication ([place])   . GERD (gastroesophageal reflux disease)   . Headache(784.0)   . Hyperlipidemia   . Hypertension   . MRSA infection    Left shin, back  . Nerve damage    BLE  . Osteoarthritis   . Peripheral vascular disease ([place])   . Pneumonia    Past Surgical History:  Procedure Laterality Date  . ABDOMINAL AORTOGRAM W/LOWER EXTREMITY N/A 12/01/2017   Procedure: ABDOMINAL AORTOGRAM W/LOWER EXTREMITY;  Surgeon: Elam Dutch, MD;  Location: [place] [facility];  Service: Cardiovascular;  Laterality: N/A;  . FEMORAL-POPLITEAL BYPASS GRAFT Right 12/11/2017   Procedure: BYPASS GRAFT RIGHT FEMORAL-ABOVE KNEE POPLITEAL ARTERY with Non Reversed Greater Saphenous Vein and Angioplasty distal vein Graft.;  Surgeon: Elam Dutch, MD;  Location: [facility] OR;  Service: Vascular;  Laterality: Right;  .  PERIPHERAL VASCULAR BALLOON ANGIOPLASTY Right 12/01/2017   Procedure: PERIPHERAL VASCULAR BALLOON ANGIOPLASTY;  Surgeon: Elam Dutch, MD;  Location: [place] [facility];  Service: Cardiovascular;  Laterality: Right;  superficial femoral, attempted  . PERIPHERAL VASCULAR INTERVENTION Left 12/01/2017   Procedure: PERIPHERAL VASCULAR INTERVENTION;  Surgeon: Elam Dutch, MD;  Location: [place] [facility];  Service: Cardiovascular;  Laterality: Left;  External iliac  . ROTATOR CUFF REPAIR    . TONSILLECTOMY    . VEIN HARVEST Right 12/11/2017   Procedure: RIGHT GREATER SAPHENOUS VEIN HARVEST;  Surgeon: Elam Dutch, MD;  Location: [place];  Service: Vascular;  Laterality: Right;    reports that he has been smoking cigarettes. He has a 50.00 pack-year smoking history. He has never used smokeless tobacco. He reports previous alcohol use of about 20.0 standard drinks of alcohol per week. He reports that he does not use drugs. family history includes Arthritis in his mother; Asthma in his maternal grandfather; Heart attack (age of onset: 56) in his father; Heart disease in an other family member; Hypertension in his father; Mental illness in an other family member; Stroke in his paternal grandfather. Allergies  Allergen Reactions  . Olmesartan Medoxomil Other (See Comments)    REACTION: dizziness       Review of Systems  Constitutional: Negative for appetite change, chills, fatigue, fever and unexpected weight change.  Eyes: Negative for visual disturbance.  Respiratory: Negative for cough, chest tightness and shortness of breath.   Cardiovascular: Negative for chest  pain, palpitations and leg swelling.  Endocrine: Negative for polydipsia and polyuria.  Musculoskeletal: Positive for arthralgias.  Neurological: Negative for dizziness, syncope, weakness, light-headedness and headaches.       Objective:   Physical Exam Vitals reviewed.  Constitutional:      Appearance: Normal  appearance.  Cardiovascular:     Rate and Rhythm: Normal rate and regular rhythm.  Pulmonary:     Effort: Pulmonary effort is normal.     Breath sounds: Normal breath sounds.  Musculoskeletal:     Right lower leg: No edema.     Left lower leg: No edema.  Neurological:     Mental Status: He is alert.        Assessment:     #1 preoperative clearance.  Patient has multiple chronic problems as above but these have been relatively stable.  His major complaint at this time is severe hip pain as he awaits replacement.  #2 hyperlipidemia treated with Lipitor.  Overdue for labs  #3 type 2 diabetes.-History of fair control.  Poor compliance with diet    Plan:     -Obtain preoperative EKG  EKG shows NSR with no acute ST changes. -Needs preoperative labs including CBC, basic metabolic panel, pro time, A1c -Repeat lipid and hepatic panel  [name] MD [facility]

## 2019-09-20 NOTE — Patient Instructions (Signed)
Preparing for Surgery Preparing for surgery is an important part of your care. It can make things go more smoothly and help you avoid complications. The steps leading up to surgery may vary among hospitals. Follow all instructions given to you by your health care providers. Ask questions if you do not understand something. Talk about any concerns that you have. Here are some questions to consider asking before your surgery:  If my surgery is not an emergency (is elective), when would be the best time to have the surgery?  What arrangements do I need to make for work, home, or school?  What will my recovery be like? How long will it be before I can return to normal activities?  Will I need to prepare my home? Will I need to arrange care for me or my children?  Should I expect to have pain after surgery? What are my pain management options? Are there nonmedical options that I can try for pain? Tell a health care provider about:   Any allergies you have.  All medicines you are taking, including vitamins, herbs, eye drops, creams, and over-the-counter medicines.  Any problems you or family members have had with anesthetic medicines.  Any blood disorders you have.  Any surgeries you have had.  Any medical conditions you have.  Whether you are pregnant or may be pregnant. What are the risks? The risks and complications of surgery depend on the specific procedure that you have. Discuss all the risks with your health care providers before your surgery. Ask about common surgical complications, which may include:  Infection.  Bleeding or a need for blood replacement (transfusion).  Allergic reactions to medicines.  Damage to surrounding nerves, tissues, or structures.  A blood clot.  Scarring.  Failure of the surgery to correct the problem. Follow these instructions before the procedure: Several days or weeks before your procedure  You may have a physical exam by your primary  health care provider to make sure it is safe for you to have surgery.  You may have testing. This may include a chest X-ray, blood and urine tests, electrocardiogram (ECG), or other testing.  Ask your health care provider about: ? Changing or stopping your regular medicines. This is especially important if you are taking diabetes medicines or blood thinners. ? Taking medicines such as aspirin and ibuprofen. These medicines can thin your blood. Do not take these medicines unless your health care provider tells you to take them. ? Taking over-the-counter medicines, vitamins, herbs, and supplements.  Do not use any products that contain nicotine or tobacco, such as cigarettes and e-cigarettes. If you need help quitting, ask your health care provider.  Avoid alcohol, or limit your alcohol intake to no more than 1 drink a day for nonpregnant women and 2 drinks a day for men. One drink equals 12 oz of beer, 5 oz of wine, or 1 oz of hard liquor.  Ask your health care provider if there are exercises you can do to prepare for surgery.  Eat a healthy diet. A healthy diet includes low-fat dairy products, low-fat (lean) meats, and fiber from whole grains, beans, and lots of fruits and vegetables.  Plan to have someone take you home from the hospital or clinic.  Plan to have a responsible adult care for you for at least 24 hours after you leave the hospital or clinic. This is important. The day before your procedure  You may be given antibiotic medicine to take by mouth to   help prevent infection. Take it as told by your health care provider.  You may be asked to shower with a germ-killing soap.  Follow instructions from your health care provider about eating and drinking restrictions. The day of your procedure  You may need to take another shower with a germ-killing soap before you leave home in the morning.  With a small sip of water, take only the medicines that you are told to take.  Do not  wear any makeup, nail polish, powder, deodorant, lotion, jewelry, hair accessories, or anything on your skin or body except your clothes.  If you will be staying in the hospital, bring a case to hold your glasses, contacts, or dentures. You may also want to bring your robe and non-skid footwear.  If instructed by your health care provider, bring your sleep apnea device with you on the day of your surgery (if this applies to you).  Arrive at the hospital as scheduled.  Bring a friend or family member with you who can help to answer questions and be present while you meet with your health care provider. At the hospital  When you arrive at the hospital, you will likely: ? Go to the reception desk to notify staff that you have arrived. ? Move to the preoperative and preparation areas before going to the operating room.  You may have to wear compression stockings. These help to prevent blood clots and reduce swelling in your legs.  An IV may be inserted into one of your veins.  In the operating room, you may be given one or more of the following: ? A medicine to help you relax (sedative). ? A medicine to numb the area (local anesthetic). ? A medicine to make you fall asleep (general anesthetic). ? A medicine that is injected into an area of your body to numb everything below the injection site (regional anesthetic).  Your surgical site will be marked or identified.  You may be given an antibiotic through your IV to help prevent infection. Contact a health care provider if you:  Develop a fever of more than 100.4F (38C) or other feelings of illness during the 48 hours before your surgery.  Have symptoms that get worse.  Have questions or concerns about your surgery. Summary  Preparing for surgery can make the procedure go more smoothly and lower your risk of complications.  Before surgery, make a list of questions and concerns to discuss with your surgeon. Ask about the risks and  possible complications.  In the days or weeks before your surgery, follow all instructions from your health care provider. You may need to stop smoking, avoid alcohol, follow eating restrictions, and change or stop your regular medicines.  Contact your surgeon if you develop a fever or other signs of illness during the few days before your surgery. This information is not intended to replace advice given to you by your health care provider. Make sure you discuss any questions you have with your health care provider. Document Revised: 08/18/2017 Document Reviewed: 06/20/2017 Elsevier Patient Education  2020 [employer]  

## 2019-09-21 LAB — BASIC METABOLIC PANEL
BUN: 15 mg/dL (ref 7–25)
CO2: 25 mmol/L (ref 20–32)
Calcium: 9.9 mg/dL (ref 8.6–10.3)
Chloride: 105 mmol/L (ref 98–110)
Creat: 0.78 mg/dL (ref 0.70–1.33)
Glucose, Bld: 96 mg/dL (ref 65–99)
Potassium: 4.1 mmol/L (ref 3.5–5.3)
Sodium: 141 mmol/L (ref 135–146)

## 2019-09-21 LAB — HEPATIC FUNCTION PANEL
AG Ratio: 1.9 (calc) (ref 1.0–2.5)
ALT: 21 U/L (ref 9–46)
AST: 14 U/L (ref 10–35)
Albumin: 4.8 g/dL (ref 3.6–5.1)
Alkaline phosphatase (APISO): 87 U/L (ref 35–144)
Bilirubin, Direct: 0.1 mg/dL (ref 0.0–0.2)
Globulin: 2.5 g/dL (calc) (ref 1.9–3.7)
Indirect Bilirubin: 0.3 mg/dL (calc) (ref 0.2–1.2)
Total Bilirubin: 0.4 mg/dL (ref 0.2–1.2)
Total Protein: 7.3 g/dL (ref 6.1–8.1)

## 2019-09-21 LAB — LIPID PANEL
Cholesterol: 190 mg/dL (ref <200)
HDL: 28 mg/dL — ABNORMAL LOW (ref >=40)
LDL Cholesterol (Calc): 130 mg/dL (calc) — ABNORMAL HIGH
Non-HDL Cholesterol (Calc): 162 mg/dL (calc) — ABNORMAL HIGH (ref <130)
Total CHOL/HDL Ratio: 6.8 (calc) — ABNORMAL HIGH (ref <5.0)
Triglycerides: 188 mg/dL — ABNORMAL HIGH (ref <150)

## 2019-09-21 LAB — CBC WITH DIFFERENTIAL/PLATELET
Absolute Monocytes: 1113 cells/uL — ABNORMAL HIGH (ref 200–950)
Basophils Absolute: 127 cells/uL (ref 0–200)
Basophils Relative: 0.8 %
Eosinophils Absolute: 413 cells/uL (ref 15–500)
Eosinophils Relative: 2.6 %
HCT: 50.2 % — ABNORMAL HIGH (ref 38.5–50.0)
Hemoglobin: 17.1 g/dL (ref 13.2–17.1)
Lymphs Abs: 2846 cells/uL (ref 850–3900)
MCH: 29.2 pg (ref 27.0–33.0)
MCHC: 34.1 g/dL (ref 32.0–36.0)
MCV: 85.8 fL (ref 80.0–100.0)
MPV: 10 fL (ref 7.5–12.5)
Monocytes Relative: 7 %
Neutro Abs: 11400 cells/uL — ABNORMAL HIGH (ref 1500–7800)
Neutrophils Relative %: 71.7 %
Platelets: 288 10*3/uL (ref 140–400)
RBC: 5.85 10*6/uL — ABNORMAL HIGH (ref 4.20–5.80)
RDW: 12.6 % (ref 11.0–15.0)
Total Lymphocyte: 17.9 %
WBC: 15.9 10*3/uL — ABNORMAL HIGH (ref 3.8–10.8)

## 2019-09-21 LAB — HEMOGLOBIN A1C
Hgb A1c MFr Bld: 7.9 % of total Hgb — ABNORMAL HIGH (ref <5.7)
Mean Plasma Glucose: 180 (calc)
eAG (mmol/L): 10 (calc)

## 2019-09-21 LAB — PROTIME-INR
INR: 1
Prothrombin Time: 10.4 s (ref 9.0–11.5)

## 2019-09-23 ENCOUNTER — Other Ambulatory Visit: Payer: Self-pay | Admitting: *Deleted

## 2019-09-23 DIAGNOSIS — E785 Hyperlipidemia, unspecified: Secondary | ICD-10-CM

## 2019-09-23 DIAGNOSIS — E7801 Familial hypercholesterolemia: Secondary | ICD-10-CM

## 2019-09-23 MED ORDER — ATORVASTATIN CALCIUM 40 MG PO TABS: 40.0000 mg | ORAL_TABLET | Freq: Every day | ORAL | 1 refills | Status: DC

## 2019-09-25 ENCOUNTER — Telehealth: Payer: Self-pay | Admitting: Family Medicine

## 2019-09-25 ENCOUNTER — Other Ambulatory Visit: Payer: Self-pay | Admitting: Family Medicine

## 2019-09-25 MED ORDER — OXYCODONE-ACETAMINOPHEN 5-325 MG PO TABS: 1.0000 | ORAL_TABLET | Freq: Three times a day (TID) | ORAL | 0 refills | Status: DC | PRN

## 2019-09-25 NOTE — Telephone Encounter (Signed)
Called pharmacy and spoke to Ivy and he stated that someone may not have realized it was a new dosage on the prescription and he is getting this ready for the patient.   I called the patient and let him know that the prescription is being filled by his pharmacy. Patient verbalized an understanding.

## 2019-09-25 NOTE — Telephone Encounter (Signed)
Please see message. Not sure if they will fill early? Please advise.

## 2019-09-25 NOTE — Telephone Encounter (Signed)
I sent this in with new instructions of 1 to 2 every 8 hours prn pain (had been 1 every 8 hours ) to get him to surgery.  We plan to wean after his surgery.  Given the fact that we changed his dosage this should be refilled.  This may require a call to pharmacy to explain

## 2019-09-25 NOTE — Telephone Encounter (Signed)
Pt's spouse, Deloss Amico, states that [facility] will not refill his medication oxycodone due to state regulations and the next refill date will be 10/07/19. He has surgery on 10/08/19. Rinaldo Cloud states that he needs it now and not sure if there is anything that can be done. [facility] ([address], [place], [place] [address])  Pt can be contacted at [phone] Or Rinaldo Cloud at [phone]

## 2019-09-25 NOTE — Telephone Encounter (Signed)
We did agree to 1 early refill.  He has scheduled hip surgery February 9.  His pain has been very poorly controlled with 1 every 8 hours and he recently had doubled this up to 2 every 8 hours.  Our plan is to start tapering him off the Percocet as soon as he gets through his recovery from the surgery  rx sent

## 2019-10-03 ENCOUNTER — Telehealth: Payer: Self-pay | Admitting: Family Medicine

## 2019-10-03 NOTE — Telephone Encounter (Signed)
Pt wife is requesting pt lab results and release be faxed to [facility] for his surgery on Tuesday.   AttnChong Sicilian  Fax: [phone]

## 2019-10-04 NOTE — Telephone Encounter (Signed)
Faxed and confined

## 2019-10-08 DIAGNOSIS — Z96651 Presence of right artificial knee joint: Secondary | ICD-10-CM | POA: Diagnosis not present

## 2019-10-08 DIAGNOSIS — M1611 Unilateral primary osteoarthritis, right hip: Secondary | ICD-10-CM | POA: Diagnosis not present

## 2019-10-10 ENCOUNTER — Other Ambulatory Visit: Payer: Self-pay | Admitting: Family Medicine

## 2019-11-12 DIAGNOSIS — M25561 Pain in right knee: Secondary | ICD-10-CM | POA: Diagnosis not present

## 2019-11-12 DIAGNOSIS — Z96641 Presence of right artificial hip joint: Secondary | ICD-10-CM | POA: Diagnosis not present

## 2019-11-15 ENCOUNTER — Other Ambulatory Visit: Payer: Self-pay

## 2019-11-18 ENCOUNTER — Encounter: Payer: Self-pay | Admitting: Family Medicine

## 2019-11-18 ENCOUNTER — Other Ambulatory Visit: Payer: Self-pay

## 2019-11-18 ENCOUNTER — Ambulatory Visit (INDEPENDENT_AMBULATORY_CARE_PROVIDER_SITE_OTHER): Payer: BC Managed Care – PPO | Admitting: Family Medicine

## 2019-11-18 VITALS — BP 138/70 | HR 95 | Temp 97.8°F | Wt 218.7 lb

## 2019-11-18 DIAGNOSIS — G549 Nerve root and plexus disorder, unspecified: Secondary | ICD-10-CM

## 2019-11-18 DIAGNOSIS — I10 Essential (primary) hypertension: Secondary | ICD-10-CM | POA: Diagnosis not present

## 2019-11-18 DIAGNOSIS — E1159 Type 2 diabetes mellitus with other circulatory complications: Secondary | ICD-10-CM | POA: Diagnosis not present

## 2019-11-18 DIAGNOSIS — E1165 Type 2 diabetes mellitus with hyperglycemia: Secondary | ICD-10-CM

## 2019-11-18 DIAGNOSIS — E7801 Familial hypercholesterolemia: Secondary | ICD-10-CM | POA: Diagnosis not present

## 2019-11-18 MED ORDER — DULOXETINE HCL 60 MG PO CPEP: 60.0000 mg | ORAL_CAPSULE | Freq: Every day | ORAL | 3 refills | Status: DC

## 2019-11-18 NOTE — Progress Notes (Signed)
Subjective:     Patient ID: Jason [name], male   DOB: 05/06/1966, 54 y.o.   MRN: [id number]  HPI Jason [name] is seen for medical follow-up.  He had recent hip replacement surgery and is getting around much better.  He has tapered off Percocet completely at this time.  He is getting around much better.  His A1c prior to surgery was 8.2%.  He is currently on combination therapy with Metformin, Januvia, and Jardiance.  He states his fasting blood sugars prior to surgery were about 180 and currently 130-140 range.  Feels much better overall.  He has hyperlipidemia with recent LDL cholesterol 130.  Is on Lipitor 40 mg daily.  He has history of peripheral vascular disease and also chronic neuropathic pain.  He has been off alcohol.  He took Cymbalta previously is currently on gabapentin.  Ran out of Cymbalta and is requesting refills.  Neuropathy pains are worse at night.  Past Medical History:  Diagnosis Date  . Anxiety   . Depression   . Diabetes mellitus without complication (HCC)   . GERD (gastroesophageal reflux disease)   . Headache(784.0)   . Hyperlipidemia   . Hypertension   . MRSA infection    Left shin, back  . Nerve damage    BLE  . Osteoarthritis   . Peripheral vascular disease (HCC)   . Pneumonia    Past Surgical History:  Procedure Laterality Date  . ABDOMINAL AORTOGRAM W/LOWER EXTREMITY N/A 12/01/2017   Procedure: ABDOMINAL AORTOGRAM W/LOWER EXTREMITY;  Surgeon: Sherren Kerns, MD;  Location: [facility];  Service: Cardiovascular;  Laterality: N/A;  . FEMORAL-POPLITEAL BYPASS GRAFT Right 12/11/2017   Procedure: BYPASS GRAFT RIGHT FEMORAL-ABOVE KNEE POPLITEAL ARTERY with Non Reversed Greater Saphenous Vein and Angioplasty distal vein Graft.;  Surgeon: Sherren Kerns, MD;  Location: [facility] OR;  Service: Vascular;  Laterality: Right;  . PERIPHERAL VASCULAR BALLOON ANGIOPLASTY Right 12/01/2017   Procedure: PERIPHERAL VASCULAR BALLOON ANGIOPLASTY;  Surgeon: Sherren Kerns,  MD;  Location: [facility]AS[facility];  Service: Cardiovascular;  Laterality: Right;  superficial femoral, attempted  . PERIPHERAL VASCULAR INTERVENTION Left 12/01/2017   Procedure: PERIPHERAL VASCULAR INTERVENTION;  Surgeon: Sherren Kerns, MD;  Location: [facility] INVASIVE CV LAB;  Service: Cardiovascular;  Laterality: Left;  External iliac  . ROTATOR CUFF REPAIR    . TONSILLECTOMY    . VEIN HARVEST Right 12/11/2017   Procedure: RIGHT GREATER SAPHENOUS VEIN HARVEST;  Surgeon: Sherren Kerns, MD;  Location: [facility] OR;  Service: Vascular;  Laterality: Right;    reports that he has been smoking cigarettes. He has a 50.00 pack-year smoking history. He has never used smokeless tobacco. He reports previous alcohol use of about 20.0 standard drinks of alcohol per week. He reports that he does not use drugs. family history includes Arthritis in his mother; Asthma in his maternal grandfather; Heart attack (age of onset: 65) in his father; Heart disease in an other family member; Hypertension in his father; Mental illness in an other family member; Stroke in his paternal grandfather. Allergies  Allergen Reactions  . Olmesartan Medoxomil Other (See Comments)    REACTION: dizziness     Review of Systems  Constitutional: Negative for fatigue and unexpected weight change.  Eyes: Negative for visual disturbance.  Respiratory: Negative for cough, chest tightness and shortness of breath.   Cardiovascular: Negative for chest pain, palpitations and leg swelling.  Endocrine: Negative for polydipsia and polyuria.  Neurological: Negative for dizziness, syncope, weakness, light-headedness  and headaches.       Objective:   Physical Exam Constitutional:      Appearance: He is well-developed.  HENT:     Right Ear: External ear normal.     Left Ear: External ear normal.  Eyes:     Pupils: Pupils are equal, round, and reactive to light.  Neck:     Thyroid: No thyromegaly.  Cardiovascular:     Rate and Rhythm:  Normal rate and regular rhythm.  Pulmonary:     Effort: Pulmonary effort is normal. No respiratory distress.     Breath sounds: Normal breath sounds. No wheezing or rales.  Musculoskeletal:     Cervical back: Neck supple.     Right lower leg: No edema.     Left lower leg: No edema.  Neurological:     Mental Status: He is alert and oriented to person, place, and time.        Assessment:     #1 type 2 diabetes.  Recent A1c 8.2%.  He has had improvement in home readings since his surgery  #2 hypertension stable  #3 dyslipidemia.  Patient had previously taken Lipitor inconsistently but is back on this more regularly now.  #4 chronic neuropathy pain not well controlled with gabapentin alone.  He has been on combination therapy in the past with gabapentin and Cymbalta    Plan:     -Refill Cymbalta 60 mg nightly -We discussed getting 80-month follow-up.  We will reassess A1c at that time along with repeat fasting lipids.  If lipids not further to goal consider increasing Lipitor or possible addition of Zetia   Jason Post MD [facility]

## 2019-12-20 ENCOUNTER — Other Ambulatory Visit: Payer: Self-pay

## 2019-12-23 ENCOUNTER — Other Ambulatory Visit (INDEPENDENT_AMBULATORY_CARE_PROVIDER_SITE_OTHER): Payer: BC Managed Care – PPO

## 2019-12-23 ENCOUNTER — Other Ambulatory Visit: Payer: Self-pay

## 2019-12-23 DIAGNOSIS — E785 Hyperlipidemia, unspecified: Secondary | ICD-10-CM | POA: Diagnosis not present

## 2019-12-23 LAB — LIPID PANEL
Cholesterol: 207 mg/dL — ABNORMAL HIGH (ref 0–200)
HDL: 28.2 mg/dL — ABNORMAL LOW (ref >39.00)
NonHDL: 178.97
Total CHOL/HDL Ratio: 7
Triglycerides: 212 mg/dL — ABNORMAL HIGH (ref 0.0–149.0)
VLDL: 42.4 mg/dL — ABNORMAL HIGH (ref 0.0–40.0)

## 2019-12-23 LAB — LDL CHOLESTEROL, DIRECT: Direct LDL: 144 mg/dL

## 2019-12-24 ENCOUNTER — Other Ambulatory Visit: Payer: Self-pay

## 2019-12-24 DIAGNOSIS — E785 Hyperlipidemia, unspecified: Secondary | ICD-10-CM

## 2019-12-24 MED ORDER — EZETIMIBE 10 MG PO TABS
10.0000 mg | ORAL_TABLET | Freq: Every day | ORAL | 1 refills | Status: DC
Start: 2019-12-24 — End: 2020-06-22

## 2020-01-01 ENCOUNTER — Telehealth: Payer: Self-pay | Admitting: Physical Medicine and Rehabilitation

## 2020-01-01 NOTE — Telephone Encounter (Signed)
Ok

## 2020-01-01 NOTE — Telephone Encounter (Signed)
Patient is requesting an appointment. Left C7-T1 il 11/15/2018. Ok to repeat if helped, same problem/side, and no new injury?

## 2020-01-02 NOTE — Telephone Encounter (Signed)
Called to schedule. Voicemail not set up.

## 2020-01-03 ENCOUNTER — Other Ambulatory Visit: Payer: Self-pay | Admitting: Family Medicine

## 2020-01-03 NOTE — Telephone Encounter (Signed)
Per [employer] prior [employer] no Berkley Harvey is required for [id number] in an office/outpatient setting.

## 2020-01-03 NOTE — Telephone Encounter (Signed)
Is auth needed for [id number]? Scheduled for 6/2 at 1530 with driver and no blood thinners.

## 2020-01-07 ENCOUNTER — Other Ambulatory Visit: Payer: Self-pay | Admitting: Family Medicine

## 2020-01-29 ENCOUNTER — Encounter: Payer: Self-pay | Admitting: Physical Medicine and Rehabilitation

## 2020-01-29 ENCOUNTER — Ambulatory Visit: Payer: BC Managed Care – PPO | Admitting: Physical Medicine and Rehabilitation

## 2020-01-29 ENCOUNTER — Ambulatory Visit: Payer: Self-pay

## 2020-01-29 ENCOUNTER — Other Ambulatory Visit: Payer: Self-pay

## 2020-01-29 VITALS — BP 143/74 | HR 73

## 2020-01-29 DIAGNOSIS — M5412 Radiculopathy, cervical region: Secondary | ICD-10-CM

## 2020-01-29 DIAGNOSIS — M542 Cervicalgia: Secondary | ICD-10-CM

## 2020-01-29 MED ORDER — METHYLPREDNISOLONE ACETATE 80 MG/ML IJ SUSP
80.0000 mg | Freq: Once | INTRAMUSCULAR | Status: AC
  Administered 2020-01-29: 80 mg

## 2020-01-29 NOTE — Progress Notes (Signed)
Pt states pain in the neck but no pain anymore in the arms. Pt states pain is better with pain medication. Pain is worse with activities.    Numeric Pain Rating Scale and Functional Assessment Average Pain (6)   In the last MONTH (on 0-10 scale) has pain interfered with the following?  1. General activity like being  able to carry out your everyday physical activities such as walking, climbing stairs, carrying groceries, or moving a chair?  Rating(6)   +Driver, -BT, -Dye Allergies.

## 2020-01-30 NOTE — Progress Notes (Signed)
Jason [name] - 54 y.o. male MRN [id number]  Date of birth: Oct 20, 1965  Office Visit Note: Visit Date: 01/29/2020 PCP: [name], MD Referred by: [name], MD  Subjective: Chief Complaint  Patient presents with  . Neck - Pain   HPI: Jason [name] is a 54 y.o. male who comes in today for planned Left C7-T1 cervical epidural steroid injection with fluoroscopic guidance.  The patient has failed conservative care including home exercise, medications, time and activity modification.  This injection will be diagnostic and hopefully therapeutic.  Please see requesting physician notes for further details and justification.   Patient is well-known to [place] from a cervical spine standpoint.  The last time we saw him was about a year ago and he was having complaints of neck pain some referral in the left arm.  He has a history of disc herniation and some central stenosis.  Last MRI 2018.  No trauma since that time but he does do a lot of physical work and he gets increasing neck pain at times.  He reports the pain is very similar to how it is been in the past and really no new issues.  He said when he first called and he was getting some referral in the left arm but that has since subsided and now is mostly neck pain radiating to the left trapezius area.  Since have seen him last he has had his hip replaced at [facility].  He is doing well from that standpoint.  Case complicated by diabetes with diabetic complications.  If he does not get relief with repeat epidural injection would potentially look at facet joint block versus regroup with a physical therapist for dry needling.  ROS Otherwise per HPI.  Assessment & Plan: Visit Diagnoses:  1. Cervical radiculopathy   2. Cervicalgia     Plan: No additional findings.   Meds & Orders:  Meds ordered this encounter  Medications  . methylPREDNISolone acetate (DEPO-MEDROL) injection 80 mg    Orders Placed This Encounter    Procedures  . XR C-ARM NO REPORT  . Epidural Steroid injection    Follow-up: Return if symptoms worsen or fail to improve.   Procedures: No procedures performed  Cervical Epidural Steroid Injection - Interlaminar Approach with Fluoroscopic Guidance  Patient: Jason [name]      Date of Birth: 10/24/1965 MRN: [id number] PCP: [name], MD      Visit Date: 01/29/2020   Universal Protocol:    Date/Time: 06/03/215:49 AM  Consent Given By: the patient  Position: PRONE  Additional Comments: Vital signs were monitored before and after the procedure. Patient was prepped and draped in the usual sterile fashion. The correct patient, procedure, and site was verified.   Injection Procedure Details:  Procedure Site One Meds Administered:  Meds ordered this encounter  Medications  . methylPREDNISolone acetate (DEPO-MEDROL) injection 80 mg     Laterality: Left  Location/Site: C7-T1  Needle size: 20 G  Needle type: Touhy  Needle Placement: Paramedian epidural space  Findings:  -Comments: Excellent flow of contrast into the epidural space.  Procedure Details: Using a paramedian approach from the side mentioned above, the region overlying the inferior lamina was localized under fluoroscopic visualization and the soft tissues overlying this structure were infiltrated with 4 ml. of 1% Lidocaine without Epinephrine. A # 20 gauge, Tuohy needle was inserted into the epidural space using a paramedian approach.  The epidural space was localized using loss of resistance  along with lateral and contralateral oblique bi-planar fluoroscopic views.  After negative aspirate for air, blood, and CSF, a 2 ml. volume of Isovue-250 was injected into the epidural space and the flow of contrast was observed. Radiographs were obtained for documentation purposes.   The injectate was administered into the level noted above.  Additional Comments:  The patient tolerated the procedure  well Dressing: 2 x 2 sterile gauze and Band-Aid    Post-procedure details: Patient was observed during the procedure. Post-procedure instructions were reviewed.  Patient left the clinic in stable condition.     Clinical History: MRI CERVICAL SPINE WITHOUT CONTRAST  TECHNIQUE: Multiplanar, multisequence MR imaging of the cervical spine was performed. No intravenous contrast was administered.  COMPARISON:  Outside exam 01/04/2011  FINDINGS: Alignment: Straightening of the normal cervical lordosis. One or 2 mm anterolisthesis C3-4. No significant curvature.  Vertebrae: Chronic discogenic endplate marrow changes C3-4 through C6-7.  Cord: No primary cord lesion.  See below for degenerative changes.  Posterior Fossa, vertebral arteries, paraspinal tissues: Negative. Insignificant posterior nasopharyngeal Thornwaldt cyst.  Disc levels:  Foramen magnum and C1-2 are normal.  C2-3: No disc pathology. Mild facet osteoarthritis but no stenosis or edema.  C3-4: Facet arthropathy on the left allowing 1 or 2 mm of anterolisthesis. Mild bulging of the disc. Foraminal stenosis on the left that could affect the C4 nerve. Foramen on the right widely patent.  C4-5: Mild disc bulge. Facet arthropathy on the left. No central canal stenosis. Foraminal narrowing on the left that could affect the C5 nerve.  C5-6: Bulging of the disc more towards the left. Facet arthropathy on the left. No compressive central canal stenosis. Foraminal stenosis on the left that could compress the C6 nerve.  C6-7: Spondylosis with endplate osteophytes and protruding disc material slightly more prominent towards the right. Effacement of the ventral subarachnoid space but no compression of the cord. Bilateral foraminal stenosis that could affect either or both C7 nerves.  C7-T1:  Normal interspace.  IMPRESSION: In this patient with right-sided symptoms, the most concerning findings are  at the C6-7 level were there is chronic spondylosis with endplate osteophytes and protruding disc material. Narrowing of the ventral subarachnoid space but no compression of the cord. Bilateral foraminal stenosis could compress either or both C7 nerves.  Left-sided predominant spondylosis and facet arthropathy at C3-4, C4-5 and C5-6 with left-sided foraminal narrowing at those levels that could possibly affect the C4, C5 and C6 nerves on the left. I do not see any right foraminal disease of significance in that region.  Compared to the outside exam, the findings appear similar, possibly minimally progressive over time.   Electronically Signed   By: Paulina Fusi M.D.   On: 07/28/2017 08:18   He reports that he has been smoking cigarettes. He has a 50.00 pack-year smoking history. He has never used smokeless tobacco.  Recent Labs    07/12/19 1559 09/20/19 1510 09/20/19 1527  HGBA1C 7.2* 7.9* 8.2*    Objective:  VS:  HT:    WT:   BMI:     BP:(!) 143/74  HR:73bpm  TEMP: ( )  RESP:  Physical Exam Vitals and nursing note reviewed.  Constitutional:      General: He is not in acute distress.    Appearance: Normal appearance. He is not ill-appearing.  HENT:     Head: Normocephalic and atraumatic.     Right Ear: External ear normal.     Left Ear: External ear normal.  Eyes:     Extraocular Movements: Extraocular movements intact.  Cardiovascular:     Rate and Rhythm: Normal rate.     Pulses: Normal pulses.  Abdominal:     General: There is no distension.     Palpations: Abdomen is soft.  Musculoskeletal:        General: No signs of injury.     Cervical back: Neck supple. Tenderness present. No rigidity.     Right lower leg: No edema.     Left lower leg: No edema.     Comments: Patient has good strength in the upper extremities with 5 out of 5 strength in wrist extension long finger flexion APB.  No intrinsic hand muscle atrophy.  Negative Hoffmann's test.   Lymphadenopathy:     Cervical: No cervical adenopathy.  Skin:    General: Skin is warm and dry.     Findings: No erythema or rash.     Comments: Acanthosis nigricans  Neurological:     General: No focal deficit present.     Mental Status: He is alert and oriented to person, place, and time.     Sensory: No sensory deficit.     Motor: No weakness or abnormal muscle tone.     Coordination: Coordination normal.  Psychiatric:        Mood and Affect: Mood normal.        Behavior: Behavior normal.     Ortho Exam  Imaging: XR C-ARM NO REPORT  Result Date: 01/29/2020 Please see Notes tab for imaging impression.   Past Medical/Family/Surgical/Social History: Medications & Allergies reviewed per EMR, new medications updated. Patient Active Problem List   Diagnosis Date Noted  . Primary osteoarthritis of right hip 07/12/2019  . Avascular necrosis (HCC) 06/15/2019  . PAD (peripheral artery disease) (HCC) 12/11/2017  . Preoperative cardiovascular examination 12/05/2017  . Peripheral arterial disease (HCC) 10/23/2017  . History of MRSA infection 10/23/2017  . Intermittent claudication (HCC) 10/17/2017  . Herniated cervical intervertebral disc 08/01/2017  . Osteoarthritis of spine 08/01/2017  . Poorly controlled type 2 diabetes mellitus with circulatory disorder (HCC) 07/11/2017  . Polyarthralgia 06/27/2017  . Neck pain 06/27/2017  . Primary osteoarthritis of both knees 05/02/2017  . Sensory neuronopathy 01/14/2017  . Transaminasemia 10/10/2016  . Vitamin B 12 deficiency 09/19/2016  . Hyperglycemia 09/19/2016  . Chest pressure 07/04/2012  . Cough 07/04/2012  . Hyperlipidemia 10/12/2010  . ANXIETY 10/12/2010  . History of alcohol abuse 10/12/2010  . TOBACCO USE 10/12/2010  . DEPRESSION 10/12/2010  . Essential hypertension, benign 10/12/2010  . GERD 10/12/2010  . Osteoarthritis 10/12/2010  . Sleep apnea 10/12/2010   Past Medical History:  Diagnosis Date  . Anxiety   .  Depression   . Diabetes mellitus without complication (HCC)   . GERD (gastroesophageal reflux disease)   . Headache(784.0)   . Hyperlipidemia   . Hypertension   . MRSA infection    Left shin, back  . Nerve damage    BLE  . Osteoarthritis   . Peripheral vascular disease (HCC)   . Pneumonia    Family History  Problem Relation Age of Onset  . Heart attack Father 65  . Hypertension Father   . Arthritis Mother   . Asthma Maternal Grandfather   . Stroke Paternal Grandfather   . Heart disease Other   . Mental illness Other   . Cancer Neg Hx   . Diabetes Neg Hx   . Drug abuse Neg Hx   . Early  death Neg Hx   . Hearing loss Neg Hx   . Hyperlipidemia Neg Hx    Past Surgical History:  Procedure Laterality Date  . ABDOMINAL AORTOGRAM W/LOWER EXTREMITY N/A 12/01/2017   Procedure: ABDOMINAL AORTOGRAM W/LOWER EXTREMITY;  Surgeon: Elam Dutch, MD;  Location: [facility];  Service: Cardiovascular;  Laterality: N/A;  . FEMORAL-POPLITEAL BYPASS GRAFT Right 12/11/2017   Procedure: BYPASS GRAFT RIGHT FEMORAL-ABOVE KNEE POPLITEAL ARTERY with Non Reversed Greater Saphenous Vein and Angioplasty distal vein Graft.;  Surgeon: Elam Dutch, MD;  Location: [facility] OR;  Service: Vascular;  Laterality: Right;  . PERIPHERAL VASCULAR BALLOON ANGIOPLASTY Right 12/01/2017   Procedure: PERIPHERAL VASCULAR BALLOON ANGIOPLASTY;  Surgeon: Elam Dutch, MD;  Location: [facility];  Service: Cardiovascular;  Laterality: Right;  superficial femoral, attempted  . PERIPHERAL VASCULAR INTERVENTION Left 12/01/2017   Procedure: PERIPHERAL VASCULAR INTERVENTION;  Surgeon: Elam Dutch, MD;  Location: [place];  Service: Cardiovascular;  Laterality: Left;  External iliac  . ROTATOR CUFF REPAIR    . TONSILLECTOMY    . VEIN HARVEST Right 12/11/2017   Procedure: RIGHT GREATER SAPHENOUS VEIN HARVEST;  Surgeon: Elam Dutch, MD;  Location: [facility] OR;  Service: Vascular;  Laterality: Right;    Social History   Occupational History  . Not on file  Tobacco Use  . Smoking status: Current Every Day Smoker    Packs/day: 2.00    Years: 25.00    Pack years: 50.00    Types: Cigarettes  . Smokeless tobacco: Never Used  . Tobacco comment: 30 cigarettes per day  Substance and Sexual Activity  . Alcohol use: Not Currently    Alcohol/week: 20.0 standard drinks    Types: 20 Glasses of wine per week    Comment: not since Jan 2018  . Drug use: No  . Sexual activity: Not Currently

## 2020-01-30 NOTE — Procedures (Signed)
Cervical Epidural Steroid Injection - Interlaminar Approach with Fluoroscopic Guidance  Patient: Jason [name]      Date of Birth: Jul 19, 1966 MRN: [id number] PCP: [name], MD      Visit Date: 01/29/2020   Universal Protocol:    Date/Time: 06/03/215:49 AM  Consent Given By: the patient  Position: PRONE  Additional Comments: Vital signs were monitored before and after the procedure. Patient was prepped and draped in the usual sterile fashion. The correct patient, procedure, and site was verified.   Injection Procedure Details:  Procedure Site One Meds Administered:  Meds ordered this encounter  Medications  . methylPREDNISolone acetate (DEPO-MEDROL) injection 80 mg     Laterality: Left  Location/Site: C7-T1  Needle size: 20 G  Needle type: Touhy  Needle Placement: Paramedian epidural space  Findings:  -Comments: Excellent flow of contrast into the epidural space.  Procedure Details: Using a paramedian approach from the side mentioned above, the region overlying the inferior lamina was localized under fluoroscopic visualization and the soft tissues overlying this structure were infiltrated with 4 ml. of 1% Lidocaine without Epinephrine. A # 20 gauge, Tuohy needle was inserted into the epidural space using a paramedian approach.  The epidural space was localized using loss of resistance along with lateral and contralateral oblique bi-planar fluoroscopic views.  After negative aspirate for air, blood, and CSF, a 2 ml. volume of Isovue-250 was injected into the epidural space and the flow of contrast was observed. Radiographs were obtained for documentation purposes.   The injectate was administered into the level noted above.  Additional Comments:  The patient tolerated the procedure well Dressing: 2 x 2 sterile gauze and Band-Aid    Post-procedure details: Patient was observed during the procedure. Post-procedure instructions were reviewed.  Patient  left the clinic in stable condition.

## 2020-02-24 ENCOUNTER — Other Ambulatory Visit: Payer: BC Managed Care – PPO

## 2020-03-03 ENCOUNTER — Other Ambulatory Visit: Payer: Self-pay | Admitting: Family Medicine

## 2020-03-10 ENCOUNTER — Encounter: Payer: Self-pay | Admitting: Family Medicine

## 2020-03-10 ENCOUNTER — Ambulatory Visit (INDEPENDENT_AMBULATORY_CARE_PROVIDER_SITE_OTHER): Payer: BC Managed Care – PPO | Admitting: Family Medicine

## 2020-03-10 ENCOUNTER — Other Ambulatory Visit: Payer: Self-pay

## 2020-03-10 VITALS — BP 124/66 | HR 80 | Temp 98.2°F | Wt 213.5 lb

## 2020-03-10 DIAGNOSIS — I739 Peripheral vascular disease, unspecified: Secondary | ICD-10-CM | POA: Diagnosis not present

## 2020-03-10 DIAGNOSIS — E1165 Type 2 diabetes mellitus with hyperglycemia: Secondary | ICD-10-CM

## 2020-03-10 DIAGNOSIS — E1159 Type 2 diabetes mellitus with other circulatory complications: Secondary | ICD-10-CM | POA: Diagnosis not present

## 2020-03-10 DIAGNOSIS — E538 Deficiency of other specified B group vitamins: Secondary | ICD-10-CM

## 2020-03-10 DIAGNOSIS — I10 Essential (primary) hypertension: Secondary | ICD-10-CM

## 2020-03-10 DIAGNOSIS — R5383 Other fatigue: Secondary | ICD-10-CM

## 2020-03-10 DIAGNOSIS — D8489 Other immunodeficiencies: Secondary | ICD-10-CM | POA: Insufficient documentation

## 2020-03-10 DIAGNOSIS — M545 Low back pain, unspecified: Secondary | ICD-10-CM

## 2020-03-10 MED ORDER — CYCLOBENZAPRINE HCL 10 MG PO TABS: 10.0000 mg | ORAL_TABLET | Freq: Every day | ORAL | 1 refills | Status: DC

## 2020-03-10 NOTE — Progress Notes (Signed)
Established Patient Office Visit  Subjective:  Patient ID: Jason [name], male    DOB: 1966/01/08  Age: 54 y.o. MRN: [id number]  CC:  Chief Complaint  Patient presents with  . Fatigue    pt states he always feels tired   . Back Pain    back pain and joint pain for awhile     HPI [name] presents for chief complaint of increased fatigue and bilateral low back pain.  He has chronic problems including hypertension, history of peripheral artery disease, type 2 diabetes, chronic polyneuropathy, osteoarthritis, past history of alcohol abuse, history of B12 deficiency, dyslipidemia.  He has bilateral lumbar back pain.  Denies any specific injury.  No radiculitis symptoms.  No relief with symptomatic measures.  He states he usually goes to bed around 9 PM but usually wakes up at 2 AM and cannot get back to sleep.  Has used alcohol in the past but not none few years now.  He has tried over-the-counter medication such as Benadryl and melatonin without relief.  He tried trazodone which did not help much.  Avoiding benzodiazepines because of his alcohol history.  He does generally feel fairly well rested when he first gets up.  There is been some question of sleep apnea past but he is not interested in pursuing further evaluation of that.  He does have history of B12 deficiency.  A few years ago his levels 191.  He is currently not taking B12 replacement.  History of dyslipidemia.  Goal LDL less than 70.  Is on Lipitor.  We added Zetia recently and needs follow-up lipids.  Tolerating well.  Past Medical History:  Diagnosis Date  . Anxiety   . Depression   . Diabetes mellitus without complication (HCC)   . GERD (gastroesophageal reflux disease)   . Headache(784.0)   . Hyperlipidemia   . Hypertension   . MRSA infection    Left shin, back  . Nerve damage    BLE  . Osteoarthritis   . Peripheral vascular disease (HCC)   . Pneumonia     Past Surgical History:  Procedure  Laterality Date  . ABDOMINAL AORTOGRAM W/LOWER EXTREMITY N/A 12/01/2017   Procedure: ABDOMINAL AORTOGRAM W/LOWER EXTREMITY;  Surgeon: Sherren Kerns, MD;  Location: [facility];  Service: Cardiovascular;  Laterality: N/A;  . FEMORAL-POPLITEAL BYPASS GRAFT Right 12/11/2017   Procedure: BYPASS GRAFT RIGHT FEMORAL-ABOVE KNEE POPLITEAL ARTERY with Non Reversed Greater Saphenous Vein and Angioplasty distal vein Graft.;  Surgeon: Sherren Kerns, MD;  Location: [facility] OR;  Service: Vascular;  Laterality: Right;  . PERIPHERAL VASCULAR BALLOON ANGIOPLASTY Right 12/01/2017   Procedure: PERIPHERAL VASCULAR BALLOON ANGIOPLASTY;  Surgeon: Sherren Kerns, MD;  Location: [facility];  Service: Cardiovascular;  Laterality: Right;  superficial femoral, attempted  . PERIPHERAL VASCULAR INTERVENTION Left 12/01/2017   Procedure: PERIPHERAL VASCULAR INTERVENTION;  Surgeon: Sherren Kerns, MD;  Location: [facility] INVASIVE CV LAB;  Service: Cardiovascular;  Laterality: Left;  External iliac  . ROTATOR CUFF REPAIR    . TONSILLECTOMY    . VEIN HARVEST Right 12/11/2017   Procedure: RIGHT GREATER SAPHENOUS VEIN HARVEST;  Surgeon: Sherren Kerns, MD;  Location: [facility] OR;  Service: Vascular;  Laterality: Right;    Family History  Problem Relation Age of Onset  . Heart attack Father 63  . Hypertension Father   . Arthritis Mother   . Asthma Maternal Grandfather   . Stroke Paternal Grandfather   . Heart  disease Other   . Mental illness Other   . Cancer Neg Hx   . Diabetes Neg Hx   . Drug abuse Neg Hx   . Early death Neg Hx   . Hearing loss Neg Hx   . Hyperlipidemia Neg Hx     Social History   Socioeconomic History  . Marital status: Married    Spouse name: Not on file  . Number of children: Not on file  . Years of education: Not on file  . Highest education level: Not on file  Occupational History  . Not on file  Tobacco Use  . Smoking status: Current Every Day Smoker    Packs/day: 2.00     Years: 25.00    Pack years: 50.00    Types: Cigarettes  . Smokeless tobacco: Never Used  . Tobacco comment: 30 cigarettes per day  Vaping Use  . Vaping Use: Never used  Substance and Sexual Activity  . Alcohol use: Not Currently    Alcohol/week: 20.0 standard drinks    Types: 20 Glasses of wine per week    Comment: not since Jan 2018  . Drug use: No  . Sexual activity: Not Currently  Other Topics Concern  . Not on file  Social History Narrative  . Not on file   Social Determinants of Health   Financial Resource Strain:   . Difficulty of Paying Living Expenses:   Food Insecurity:   . Worried About Programme researcher, broadcasting/film/video in the Last Year:   . Barista in the Last Year:   Transportation Needs:   . Freight forwarder (Medical):   Marland Kitchen Lack of Transportation (Non-Medical):   Physical Activity:   . Days of Exercise per Week:   . Minutes of Exercise per Session:   Stress:   . Feeling of Stress :   Social Connections:   . Frequency of Communication with Friends and Family:   . Frequency of Social Gatherings with Friends and Family:   . Attends Religious Services:   . Active Member of Clubs or Organizations:   . Attends Banker Meetings:   Marland Kitchen Marital Status:   Intimate Partner Violence:   . Fear of Current or Ex-Partner:   . Emotionally Abused:   Marland Kitchen Physically Abused:   . Sexually Abused:     Outpatient Medications Prior to Visit  Medication Sig Dispense Refill  . atorvastatin (LIPITOR) 40 MG tablet Take 1 tablet (40 mg total) by mouth daily. 90 tablet 1  . Blood Glucose Monitoring Suppl (CONTOUR BLOOD GLUCOSE SYSTEM) DEVI Use once daily for diabetes Dx E11.9 1 Device 0  . celecoxib (CELEBREX) 200 MG capsule TAKE 1 CAPSULE BY MOUTH TWICE A DAY 60 capsule 0  . CONTOUR NEXT TEST test strip TEST GLUCOSE ONCE DAILY. DX E 11.9 100 strip 3  . DULoxetine (CYMBALTA) 60 MG capsule Take 1 capsule (60 mg total) by mouth daily. 90 capsule 3  . ezetimibe (ZETIA) 10 MG  tablet Take 1 tablet (10 mg total) by mouth daily. 90 tablet 1  . gabapentin (NEURONTIN) 300 MG capsule TAKE ONE CAPSULE BY MOUTH EVERY MORNING AND TAKE 2 CAPSULES BY MOUTH AT BEDTIME 270 capsule 3  . JARDIANCE 10 MG TABS tablet TAKE 1 TABLET BY MOUTH DAILY 90 tablet 3  . Lancets MISC Contour Lancets.  Test glucose once daily. DxE11.9 100 each 3  . metFORMIN (GLUCOPHAGE) 500 MG tablet TAKE 2 TABLETS BY MOUTH TWICE A DAY 360 tablet  0  . metoprolol succinate (TOPROL-XL) 25 MG 24 hr tablet Take 1 tablet (25 mg total) by mouth daily. 90 tablet 2  . sitaGLIPtin (JANUVIA) 100 MG tablet Take 1 tablet (100 mg total) by mouth daily. 90 tablet 3  . Vitamin D, Ergocalciferol, (DRISDOL) 1.25 MG (50000 UT) CAPS capsule TAKE 1 CAPSULE (50,000 UNITS TOTAL) BY MOUTH EVERY 7 (SEVEN) DAYS. 12 capsule 0   No facility-administered medications prior to visit.    Allergies  Allergen Reactions  . Olmesartan Medoxomil Other (See Comments)    REACTION: dizziness    ROS Review of Systems  Constitutional: Positive for fatigue. Negative for appetite change, chills, fever and unexpected weight change.  Respiratory: Negative for cough and shortness of breath.   Cardiovascular: Negative for chest pain.  Gastrointestinal: Negative for abdominal pain.  Endocrine: Negative for cold intolerance and heat intolerance.  Neurological: Negative for headaches.  Hematological: Negative for adenopathy.  Psychiatric/Behavioral: Positive for sleep disturbance.      Objective:    Physical Exam Vitals reviewed.  Constitutional:      Appearance: Normal appearance.  Cardiovascular:     Rate and Rhythm: Normal rate and regular rhythm.  Pulmonary:     Comments: Slightly diminished breath sounds throughout.  No wheezes or rales. Abdominal:     Palpations: Abdomen is soft. There is no mass.     Tenderness: There is no abdominal tenderness.  Musculoskeletal:     Cervical back: Neck supple.     Right lower leg: No edema.      Left lower leg: No edema.  Lymphadenopathy:     Cervical: No cervical adenopathy.  Neurological:     General: No focal deficit present.     Mental Status: He is alert.     Cranial Nerves: No cranial nerve deficit.     BP 124/66 (BP Location: Left Arm, Patient Position: Sitting, Cuff Size: Normal)   Pulse 80   Temp 98.2 F (36.8 C) (Oral)   Wt 213 lb 8 oz (96.8 kg)   SpO2 96%   BMI 30.63 kg/m  Wt Readings from Last 3 Encounters:  03/10/20 213 lb 8 oz (96.8 kg)  11/18/19 218 lb 11.2 oz (99.2 kg)  09/20/19 222 lb 2 oz (100.8 kg)     Health Maintenance Due  Topic Date Due  . Hepatitis C Screening  Never done  . FOOT EXAM  Never done  . OPHTHALMOLOGY EXAM  Never done  . URINE MICROALBUMIN  Never done  . COVID-19 Vaccine (1) Never done  . HIV Screening  Never done  . TETANUS/TDAP  08/29/2012  . COLONOSCOPY  Never done    There are no preventive care reminders to display for this patient.  Lab Results  Component Value Date   TSH 1.15 12/09/2016   Lab Results  Component Value Date   WBC 15.9 (H) 09/20/2019   HGB 17.1 09/20/2019   HCT 50.2 (H) 09/20/2019   MCV 85.8 09/20/2019   PLT 288 09/20/2019   Lab Results  Component Value Date   NA 141 09/20/2019   K 4.1 09/20/2019   CO2 25 09/20/2019   GLUCOSE 96 09/20/2019   BUN 15 09/20/2019   CREATININE 0.78 09/20/2019   BILITOT 0.4 09/20/2019   ALKPHOS 95 12/28/2017   AST 14 09/20/2019   ALT 21 09/20/2019   PROT 7.3 09/20/2019   ALBUMIN 4.5 12/28/2017   CALCIUM 9.9 09/20/2019   ANIONGAP 10 12/12/2017   GFR 106.56 12/28/2017   Lab  Results  Component Value Date   CHOL 207 (H) 12/23/2019   Lab Results  Component Value Date   HDL 28.20 (L) 12/23/2019   Lab Results  Component Value Date   LDLCALC 130 (H) 09/20/2019   Lab Results  Component Value Date   TRIG 212.0 (H) 12/23/2019   Lab Results  Component Value Date   CHOLHDL 7 12/23/2019   Lab Results  Component Value Date   HGBA1C 8.2 (A)  09/20/2019      Assessment & Plan:   #1 fatigue.  Probably multifactorial.  Suspect at least partly related to poor sleep quality.  His job is fairly physical but is not getting regular exercise.  Does have past history of B12 deficiency and we need to rule out recurrence of that since he is not on replacement and also is on Metformin which could reduce absorption  -Check labs with basic chemistries, CBC, TSH, B12 level  #2 hyperlipidemia.  Goal LDL less than 70.  Patient on combination therapy with Zetia and Lipitor -Recheck lipid and hepatic panel  #3 type 2 diabetes.  Last A1c 8.2%. -Recheck A1c. -Further titration of medications if A1c not closer to goal  #4 chronic insomnia. -Sleep hygiene discussed -Consider trial of Flexeril 10 mg nightly which hopefully can help with his low back pain as well  #4 bilateral lumbar back pain.  Suspect muscular -Trial of Flexeril as above  Meds ordered this encounter  Medications  . cyclobenzaprine (FLEXERIL) 10 MG tablet    Sig: Take 1 tablet (10 mg total) by mouth at bedtime.    Dispense:  30 tablet    Refill:  1    Follow-up: Return in about 3 months (around 06/10/2020).    Evelena PeatBruce Corgan Mormile, MD

## 2020-03-11 LAB — LIPID PANEL
Cholesterol: 157 mg/dL (ref <200)
HDL: 33 mg/dL — ABNORMAL LOW (ref >=40)
LDL Cholesterol (Calc): 99 mg/dL (calc)
Non-HDL Cholesterol (Calc): 124 mg/dL (calc) (ref <130)
Total CHOL/HDL Ratio: 4.8 (calc) (ref <5.0)
Triglycerides: 157 mg/dL — ABNORMAL HIGH (ref <150)

## 2020-03-11 LAB — HEPATIC FUNCTION PANEL
AG Ratio: 1.8 (calc) (ref 1.0–2.5)
ALT: 18 U/L (ref 9–46)
AST: 14 U/L (ref 10–35)
Albumin: 4.4 g/dL (ref 3.6–5.1)
Alkaline phosphatase (APISO): 100 U/L (ref 35–144)
Bilirubin, Direct: 0.1 mg/dL (ref 0.0–0.2)
Globulin: 2.4 g/dL (calc) (ref 1.9–3.7)
Indirect Bilirubin: 0.4 mg/dL (calc) (ref 0.2–1.2)
Total Bilirubin: 0.5 mg/dL (ref 0.2–1.2)
Total Protein: 6.8 g/dL (ref 6.1–8.1)

## 2020-03-11 LAB — CBC WITH DIFFERENTIAL/PLATELET
Absolute Monocytes: 1315 cells/uL — ABNORMAL HIGH (ref 200–950)
Basophils Absolute: 110 cells/uL (ref 0–200)
Basophils Relative: 0.8 %
Eosinophils Absolute: 96 cells/uL (ref 15–500)
Eosinophils Relative: 0.7 %
HCT: 50.2 % — ABNORMAL HIGH (ref 38.5–50.0)
Hemoglobin: 16.8 g/dL (ref 13.2–17.1)
Lymphs Abs: 1945 cells/uL (ref 850–3900)
MCH: 28.8 pg (ref 27.0–33.0)
MCHC: 33.5 g/dL (ref 32.0–36.0)
MCV: 86.1 fL (ref 80.0–100.0)
MPV: 10.3 fL (ref 7.5–12.5)
Monocytes Relative: 9.6 %
Neutro Abs: 10234 cells/uL — ABNORMAL HIGH (ref 1500–7800)
Neutrophils Relative %: 74.7 %
Platelets: 282 10*3/uL (ref 140–400)
RBC: 5.83 10*6/uL — ABNORMAL HIGH (ref 4.20–5.80)
RDW: 13.5 % (ref 11.0–15.0)
Total Lymphocyte: 14.2 %
WBC: 13.7 10*3/uL — ABNORMAL HIGH (ref 3.8–10.8)

## 2020-03-11 LAB — VITAMIN B12: Vitamin B-12: 405 pg/mL (ref 200–1100)

## 2020-03-11 LAB — BASIC METABOLIC PANEL
BUN: 9 mg/dL (ref 7–25)
CO2: 23 mmol/L (ref 20–32)
Calcium: 9.4 mg/dL (ref 8.6–10.3)
Chloride: 101 mmol/L (ref 98–110)
Creat: 0.82 mg/dL (ref 0.70–1.33)
Glucose, Bld: 253 mg/dL — ABNORMAL HIGH (ref 65–99)
Potassium: 4 mmol/L (ref 3.5–5.3)
Sodium: 136 mmol/L (ref 135–146)

## 2020-03-11 LAB — HEMOGLOBIN A1C
Hgb A1c MFr Bld: 7.8 % of total Hgb — ABNORMAL HIGH (ref <5.7)
Mean Plasma Glucose: 177 (calc)
eAG (mmol/L): 9.8 (calc)

## 2020-03-11 LAB — TSH: TSH: 0.58 mIU/L (ref 0.40–4.50)

## 2020-03-17 ENCOUNTER — Encounter: Payer: Self-pay | Admitting: Family Medicine

## 2020-03-18 ENCOUNTER — Ambulatory Visit (INDEPENDENT_AMBULATORY_CARE_PROVIDER_SITE_OTHER): Payer: BC Managed Care – PPO | Admitting: Family Medicine

## 2020-03-18 ENCOUNTER — Encounter: Payer: Self-pay | Admitting: Family Medicine

## 2020-03-18 ENCOUNTER — Other Ambulatory Visit: Payer: Self-pay

## 2020-03-18 VITALS — BP 124/68 | HR 68 | Temp 98.1°F | Wt 217.7 lb

## 2020-03-18 DIAGNOSIS — M79604 Pain in right leg: Secondary | ICD-10-CM | POA: Diagnosis not present

## 2020-03-18 NOTE — Patient Instructions (Signed)
Set up 4 month follow  Follow up for any recurrent redness or swelling.

## 2020-03-18 NOTE — Progress Notes (Signed)
Established Patient Office Visit  Subjective:  Patient ID: Jason [name], male    DOB: 1966/01/05  Age: 54 y.o. MRN: [id number]  CC:  Chief Complaint  Patient presents with  . Leg Swelling    right leg swelling and red since last week but has gotten better and redness has gone away    HPI [employer] presents for right leg swelling and pain couple days ago.  His wife had called and set up this appointment.  [name] states that his symptoms have basically resolved at this time.  He describes some redness right proximal anterior leg with some radiation toward the knee  Past Medical History:  Diagnosis Date  . Anxiety   . Depression   . Diabetes mellitus without complication (HCC)   . GERD (gastroesophageal reflux disease)   . Headache(784.0)   . Hyperlipidemia   . Hypertension   . MRSA infection    Left shin, back  . Nerve damage    BLE  . Osteoarthritis   . Peripheral vascular disease (HCC)   . Pneumonia     Past Surgical History:  Procedure Laterality Date  . ABDOMINAL AORTOGRAM W/LOWER EXTREMITY N/A 12/01/2017   Procedure: ABDOMINAL AORTOGRAM W/LOWER EXTREMITY;  Surgeon: Sherren Kerns, MD;  Location: [facility];  Service: Cardiovascular;  Laterality: N/A;  . FEMORAL-POPLITEAL BYPASS GRAFT Right 12/11/2017   Procedure: BYPASS GRAFT RIGHT FEMORAL-ABOVE KNEE POPLITEAL ARTERY with Non Reversed Greater Saphenous Vein and Angioplasty distal vein Graft.;  Surgeon: Sherren Kerns, MD;  Location: [facility] OR;  Service: Vascular;  Laterality: Right;  . PERIPHERAL VASCULAR BALLOON ANGIOPLASTY Right 12/01/2017   Procedure: PERIPHERAL VASCULAR BALLOON ANGIOPLASTY;  Surgeon: Sherren Kerns, MD;  Location: [facility] INVAS[facility] LAB;  Service: Cardiovascular;  Laterality: Right;  superficial femoral, attempted  . PERIPHERAL VASCULAR INTERVENTION Left 12/01/2017   Procedure: PERIPHERAL VASCULAR INTERVENTION;  Surgeon: Sherren Kerns, MD;  Location: [facility] INVASIVE CV LAB;  Service:  Cardiovascular;  Laterality: Left;  External iliac  . ROTATOR CUFF REPAIR    . TONSILLECTOMY    . VEIN HARVEST Right 12/11/2017   Procedure: RIGHT GREATER SAPHENOUS VEIN HARVEST;  Surgeon: Sherren Kerns, MD;  Location: [facility] OR;  Service: Vascular;  Laterality: Right;    Family History  Problem Relation Age of Onset  . Heart attack Father 53  . Hypertension Father   . Arthritis Mother   . Asthma Maternal Grandfather   . Stroke Paternal Grandfather   . Heart disease Other   . Mental illness Other   . Cancer Neg Hx   . Diabetes Neg Hx   . Drug abuse Neg Hx   . Early death Neg Hx   . Hearing loss Neg Hx   . Hyperlipidemia Neg Hx     Social History   Socioeconomic History  . Marital status: Married    Spouse name: Not on file  . Number of children: Not on file  . Years of education: Not on file  . Highest education level: Not on file  Occupational History  . Not on file  Tobacco Use  . Smoking status: Current Every Day Smoker    Packs/day: 2.00    Years: 25.00    Pack years: 50.00    Types: Cigarettes  . Smokeless tobacco: Never Used  . Tobacco comment: 30 cigarettes per day  Vaping Use  . Vaping Use: Never used  Substance and Sexual Activity  . Alcohol use: Not Currently  Alcohol/week: 20.0 standard drinks    Types: 20 Glasses of wine per week    Comment: not since Jan 2018  . Drug use: No  . Sexual activity: Not Currently  Other Topics Concern  . Not on file  Social History Narrative  . Not on file   Social Determinants of Health   Financial Resource Strain:   . Difficulty of Paying Living Expenses:   Food Insecurity:   . Worried About Programme researcher, broadcasting/film/video in the Last Year:   . Barista in the Last Year:   Transportation Needs:   . Freight forwarder (Medical):   Marland Kitchen Lack of Transportation (Non-Medical):   Physical Activity:   . Days of Exercise per Week:   . Minutes of Exercise per Session:   Stress:   . Feeling of Stress :   Social  Connections:   . Frequency of Communication with Friends and Family:   . Frequency of Social Gatherings with Friends and Family:   . Attends Religious Services:   . Active Member of Clubs or Organizations:   . Attends Banker Meetings:   Marland Kitchen Marital Status:   Intimate Partner Violence:   . Fear of Current or Ex-Partner:   . Emotionally Abused:   Marland Kitchen Physically Abused:   . Sexually Abused:     Outpatient Medications Prior to Visit  Medication Sig Dispense Refill  . atorvastatin (LIPITOR) 40 MG tablet Take 1 tablet (40 mg total) by mouth daily. 90 tablet 1  . Blood Glucose Monitoring Suppl (CONTOUR BLOOD GLUCOSE SYSTEM) DEVI Use once daily for diabetes Dx E11.9 1 Device 0  . celecoxib (CELEBREX) 200 MG capsule TAKE 1 CAPSULE BY MOUTH TWICE A DAY 60 capsule 0  . CONTOUR NEXT TEST test strip TEST GLUCOSE ONCE DAILY. DX E 11.9 100 strip 3  . cyclobenzaprine (FLEXERIL) 10 MG tablet Take 1 tablet (10 mg total) by mouth at bedtime. 30 tablet 1  . DULoxetine (CYMBALTA) 60 MG capsule Take 1 capsule (60 mg total) by mouth daily. 90 capsule 3  . ezetimibe (ZETIA) 10 MG tablet Take 1 tablet (10 mg total) by mouth daily. 90 tablet 1  . gabapentin (NEURONTIN) 300 MG capsule TAKE ONE CAPSULE BY MOUTH EVERY MORNING AND TAKE 2 CAPSULES BY MOUTH AT BEDTIME 270 capsule 3  . JARDIANCE 10 MG TABS tablet TAKE 1 TABLET BY MOUTH DAILY 90 tablet 3  . Lancets MISC Contour Lancets.  Test glucose once daily. DxE11.9 100 each 3  . metFORMIN (GLUCOPHAGE) 500 MG tablet TAKE 2 TABLETS BY MOUTH TWICE A DAY 360 tablet 0  . metoprolol succinate (TOPROL-XL) 25 MG 24 hr tablet Take 1 tablet (25 mg total) by mouth daily. 90 tablet 2  . sitaGLIPtin (JANUVIA) 100 MG tablet Take 1 tablet (100 mg total) by mouth daily. 90 tablet 3  . Vitamin D, Ergocalciferol, (DRISDOL) 1.25 MG (50000 UT) CAPS capsule TAKE 1 CAPSULE (50,000 UNITS TOTAL) BY MOUTH EVERY 7 (SEVEN) DAYS. 12 capsule 0   No facility-administered medications  prior to visit.    Allergies  Allergen Reactions  . Olmesartan Medoxomil Other (See Comments)    REACTION: dizziness    ROS Review of Systems  Constitutional: Negative for chills and fever.  Respiratory: Negative for cough and shortness of breath.   Cardiovascular: Negative for chest pain.      Objective:    Physical Exam Vitals reviewed.  Constitutional:      Appearance: Normal appearance.  Cardiovascular:  Rate and Rhythm: Normal rate and regular rhythm.  Pulmonary:     Effort: Pulmonary effort is normal.     Breath sounds: Normal breath sounds.  Musculoskeletal:     Comments: Legs revealed only trace edema lower legs bilaterally.  Right leg reveals no significant erythema.  No warmth.  Nontender.  He has scars from previous vascular surgery.  Right foot is warm to touch with excellent capillary refill and palpable dorsalis pedis pulse.  Neurological:     Mental Status: He is alert.     BP 124/68 (BP Location: Left Arm, Patient Position: Sitting, Cuff Size: Normal)   Pulse 68   Temp 98.1 F (36.7 C) (Oral)   Wt 217 lb 11.2 oz (98.7 kg)   SpO2 97%   BMI 31.24 kg/m  Wt Readings from Last 3 Encounters:  03/18/20 217 lb 11.2 oz (98.7 kg)  03/10/20 213 lb 8 oz (96.8 kg)  11/18/19 218 lb 11.2 oz (99.2 kg)     Health Maintenance Due  Topic Date Due  . Hepatitis C Screening  Never done  . FOOT EXAM  Never done  . OPHTHALMOLOGY EXAM  Never done  . URINE MICROALBUMIN  Never done  . COVID-19 Vaccine (1) Never done  . HIV Screening  Never done  . TETANUS/TDAP  08/29/2012  . COLONOSCOPY  Never done    There are no preventive care reminders to display for this patient.  Lab Results  Component Value Date   TSH 0.58 03/10/2020   Lab Results  Component Value Date   WBC 13.7 (H) 03/10/2020   HGB 16.8 03/10/2020   HCT 50.2 (H) 03/10/2020   MCV 86.1 03/10/2020   PLT 282 03/10/2020   Lab Results  Component Value Date   NA 136 03/10/2020   K 4.0  03/10/2020   CO2 23 03/10/2020   GLUCOSE 253 (H) 03/10/2020   BUN 9 03/10/2020   CREATININE 0.82 03/10/2020   BILITOT 0.5 03/10/2020   ALKPHOS 95 12/28/2017   AST 14 03/10/2020   ALT 18 03/10/2020   PROT 6.8 03/10/2020   ALBUMIN 4.5 12/28/2017   CALCIUM 9.4 03/10/2020   ANIONGAP 10 12/12/2017   GFR 106.56 12/28/2017   Lab Results  Component Value Date   CHOL 157 03/10/2020   Lab Results  Component Value Date   HDL 33 (L) 03/10/2020   Lab Results  Component Value Date   LDLCALC 99 03/10/2020   Lab Results  Component Value Date   TRIG 157 (H) 03/10/2020   Lab Results  Component Value Date   CHOLHDL 4.8 03/10/2020   Lab Results  Component Value Date   HGBA1C 7.8 (H) 03/10/2020      Assessment & Plan:   Transient erythema and swelling right lower extremity.  No evidence for cellulitis currently.  No clinical evidence for likely DVT  -Reassurance and observation for now.  Follow-up for any recurrent erythema, warmth, pain, or other concerns  No orders of the defined types were placed in this encounter.   Follow-up: Return in about 4 months (around 07/19/2020).    Evelena Peat, MD

## 2020-03-27 ENCOUNTER — Other Ambulatory Visit: Payer: Self-pay | Admitting: Family Medicine

## 2020-04-07 ENCOUNTER — Other Ambulatory Visit: Payer: Self-pay | Admitting: Family Medicine

## 2020-04-14 ENCOUNTER — Ambulatory Visit
Admission: EM | Admit: 2020-04-14 | Discharge: 2020-04-14 | Disposition: A | Discharge status: Home / Self Care | Payer: Managed Care, Other (non HMO) | Attending: Physician Assistant | Admitting: Physician Assistant

## 2020-04-14 ENCOUNTER — Other Ambulatory Visit: Payer: Self-pay

## 2020-04-14 DIAGNOSIS — Z1152 Encounter for screening for COVID-19: Secondary | ICD-10-CM | POA: Diagnosis not present

## 2020-04-14 DIAGNOSIS — R519 Headache, unspecified: Secondary | ICD-10-CM

## 2020-04-14 DIAGNOSIS — Z20822 Contact with and (suspected) exposure to covid-19: Secondary | ICD-10-CM

## 2020-04-14 MED ORDER — FLUTICASONE PROPIONATE 50 MCG/ACT NA SUSP
2.0000 | Freq: Every day | NASAL | 0 refills | Status: DC
Start: 2020-04-14 — End: 2020-08-10

## 2020-04-14 NOTE — Discharge Instructions (Addendum)
COVID PCR testing ordered. I would like you to quarantine until testing results. You can take over the counter flonase/nasacort to help with nasal congestion/drainage. Tylenol/motrin for pain and fever. Keep hydrated, urine should be clear to pale yellow in color. If experiencing shortness of breath, trouble breathing, go to the emergency department for further evaluation needed.  As discussed, if your test comes back negative, but develop more symptoms such as cough, nasal drainage, fever, shortness of breath, loss of taste/smell, will need retesting.

## 2020-04-14 NOTE — ED Provider Notes (Signed)
[name]    CSN: [id number] Arrival date & time: 04/14/20  0931      History   Chief Complaint Chief Complaint  Patient presents with  . Fatigue    HPI Jason [name] is a 54 y.o. male.   54 year old male comes in for headache and fatigue that started this morning. Denies URI symptoms. Denies fever, chills, body aches. Denies abdominal pain, nausea, vomiting, diarrhea. Denies shortness of breath, loss of taste/smell. Positive COVID exposure 1 week ago. Work requiring testing. COVID vaccinated.      Past Medical History:  Diagnosis Date  . Anxiety   . Depression   . Diabetes mellitus without complication (HCC)   . GERD (gastroesophageal reflux disease)   . Headache(784.0)   . Hyperlipidemia   . Hypertension   . MRSA infection    Left shin, back  . Nerve damage    BLE  . Osteoarthritis   . Peripheral vascular disease (HCC)   . Pneumonia     Patient Active Problem List   Diagnosis Date Noted  . 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 Primary osteoarthritis of right hip 07/12/2019  . Avascular necrosis (HCC) 06/15/2019  . PAD (peripheral artery disease) (HCC) 12/11/2017  . Preoperative cardiovascular examination 12/05/2017  . Peripheral arterial disease (HCC) 10/23/2017  . History of MRSA infection 10/23/2017  . Intermittent claudication (HCC) 10/17/2017  . Herniated cervical intervertebral disc 08/01/2017  . Osteoarthritis of spine 08/01/2017  . Poorly controlled type 2 diabetes mellitus with circulatory disorder (HCC) 07/11/2017  . Polyarthralgia 06/27/2017  . Neck pain 06/27/2017  . Primary osteoarthritis of both knees 05/02/2017  . Sensory neuronopathy 01/14/2017  . Transaminasemia 10/10/2016  . Vitamin B 12 deficiency 09/19/2016  . Hyperglycemia 09/19/2016  . Chest pressure 07/04/2012  . Cough 07/04/2012  . Hyperlipidemia 10/12/2010  . ANXIETY  10/12/2010  . History of alcohol abuse 10/12/2010  . TOBACCO USE 10/12/2010  . DEPRESSION 10/12/2010  . Essential hypertension, benign 10/12/2010  . GERD 10/12/2010  . Osteoarthritis 10/12/2010  . Sleep apnea 10/12/2010    Past Surgical History:  Procedure Laterality Date  . ABDOMINAL AORTOGRAM W/LOWER EXTREMITY N/A 12/01/2017   Procedure: ABDOMINAL AORTOGRAM W/LOWER EXTREMITY;  Surgeon: Sherren Kerns, MD;  Location: [facility];  Service: Cardiovascular;  Laterality: N/A;  . FEMORAL-POPLITEAL BYPASS GRAFT Right 12/11/2017   Procedure: BYPASS GRAFT RIGHT FEMORAL-ABOVE KNEE POPLITEAL ARTERY with Non Reversed Greater Saphenous Vein and Angioplasty distal vein Graft.;  Surgeon: Sherren Kerns, MD;  Location: [facility] OR;  Service: Vascular;  Laterality: Right;  . PERIPHERAL VASCULAR BALLOON ANGIOPLASTY Right 12/01/2017   Procedure: PERIPHERAL VASCULAR BALLOON ANGIOPLASTY;  Surgeon: Sherren Kerns, MD;  Location: [facility];  Service: Cardiovascular;  Laterality: Right;  superficial femoral, attempted  . PERIPHERAL VASCULAR INTERVENTION Left 12/01/2017   Procedure: PERIPHERAL VASCULAR INTERVENTION;  Surgeon: Sherren Kerns, MD;  Location: [facility] INVASIVE CV LAB;  Service: Cardiovascular;  Laterality: Left;  External iliac  . ROTATOR CUFF REPAIR    . TONSILLECTOMY    . VEIN HARVEST Right 12/11/2017   Procedure: RIGHT GREATER SAPHENOUS VEIN HARVEST;  Surgeon: Sherren Kerns, MD;  Location: [facility] OR;  Service: Vascular;  Laterality: Right;       Home Medications    Prior to Admission medications   Medication Sig Start Date End Date Taking? Authorizing Provider  atorvastatin (LIPITOR) 40 MG  tablet Take 1 tablet (40 mg total) by mouth daily. 09/23/19   Burchette, Elberta Fortis, MD  Blood Glucose Monitoring Suppl (CONTOUR BLOOD GLUCOSE SYSTEM) DEVI Use once daily for diabetes Dx E11.9 07/14/17   Burchette, Elberta Fortis, MD  celecoxib (CELEBREX) 200 MG capsule TAKE 1 CAPSULE BY MOUTH TWICE A  DAY 03/27/20   Burchette, Elberta Fortis, MD  CONTOUR NEXT TEST test strip TEST GLUCOSE ONCE DAILY. DX E 11.9 03/03/20   Burchette, Elberta Fortis, MD  cyclobenzaprine (FLEXERIL) 10 MG tablet Take 1 tablet (10 mg total) by mouth at bedtime. 03/10/20   Burchette, Elberta Fortis, MD  DULoxetine (CYMBALTA) 60 MG capsule Take 1 capsule (60 mg total) by mouth daily. 11/18/19 11/17/20  Burchette, Elberta Fortis, MD  ezetimibe (ZETIA) 10 MG tablet Take 1 tablet (10 mg total) by mouth daily. 12/24/19   Burchette, Elberta Fortis, MD  fluticasone (FLONASE) 50 MCG/ACT nasal spray Place 2 sprays into both nostrils daily. 04/14/20   Cathie Hoops, Chana Lindstrom V, PA-C  gabapentin (NEURONTIN) 300 MG capsule TAKE ONE CAPSULE BY MOUTH EVERY MORNING AND TAKE 2 CAPSULES BY MOUTH AT BEDTIME 04/29/19   Burchette, Elberta Fortis, MD  JANUVIA 100 MG tablet TAKE 1 TABLET BY MOUTH EVERY DAY 03/27/20   Burchette, Elberta Fortis, MD  JARDIANCE 10 MG TABS tablet TAKE 1 TABLET BY MOUTH DAILY 01/03/20   Burchette, Elberta Fortis, MD  Lancets MISC Contour Lancets.  Test glucose once daily. DxE11.9 07/14/17   Burchette, Elberta Fortis, MD  metFORMIN (GLUCOPHAGE) 500 MG tablet TAKE 2 TABLETS BY MOUTH TWICE A DAY 04/07/20   Burchette, Elberta Fortis, MD  metoprolol succinate (TOPROL-XL) 25 MG 24 hr tablet Take 1 tablet (25 mg total) by mouth daily. 12/05/17   Revankar, Aundra Dubin, MD  Vitamin D, Ergocalciferol, (DRISDOL) 1.25 MG (50000 UT) CAPS capsule TAKE 1 CAPSULE (50,000 UNITS TOTAL) BY MOUTH EVERY 7 (SEVEN) DAYS. 09/03/19   Judi Saa, DO    Family History Family History  Problem Relation Age of Onset  . Heart attack Father 3  . Hypertension Father   . Arthritis Mother   . Asthma Maternal Grandfather   . Stroke Paternal Grandfather   . Heart disease Other   . Mental illness Other   . Cancer Neg Hx   . Diabetes Neg Hx   . Drug abuse Neg Hx   . Early death Neg Hx   . Hearing loss Neg Hx   . Hyperlipidemia Neg Hx     Social History Social History   Tobacco Use  . Smoking status: Current Every Day Smoker     Packs/day: 2.00    Years: 25.00    Pack years: 50.00    Types: Cigarettes  . Smokeless tobacco: Never Used  . Tobacco comment: 30 cigarettes per day  Vaping Use  . Vaping Use: Never used  Substance Use Topics  . Alcohol use: Not Currently    Alcohol/week: 20.0 standard drinks    Types: 20 Glasses of wine per week    Comment: not since Jan 2018  . Drug use: No     Allergies   Olmesartan medoxomil   Review of Systems Review of Systems  Reason unable to perform ROS: See HPI as above.     Physical Exam Triage Vital Signs ED Triage Vitals  Enc Vitals Group     BP 04/14/20 0959 (!) 151/65     Pulse Rate 04/14/20 0959 (!) 103     Resp 04/14/20 0959 16  Temp 04/14/20 0959 99.5 F (37.5 C)     Temp Source 04/14/20 0959 Oral     SpO2 04/14/20 0959 96 %     Weight --      Height --      Head Circumference --      Peak Flow --      Pain Score 04/14/20 1001 0     Pain Loc --      Pain Edu? --      Excl. in GC? --    No data found.  Updated Vital Signs BP (!) 151/65 (BP Location: Left Arm)   Pulse (!) 103   Temp 99.5 F (37.5 C) (Oral)   Resp 16   SpO2 96%   Physical Exam Constitutional:      General: He is not in acute distress.    Appearance: He is well-developed. He is not ill-appearing, toxic-appearing or diaphoretic.  HENT:     Head: Normocephalic and atraumatic.     Right Ear: Tympanic membrane, ear canal and external ear normal. Tympanic membrane is not erythematous or bulging.     Left Ear: Tympanic membrane, ear canal and external ear normal. Tympanic membrane is not erythematous or bulging.     Nose:     Right Sinus: No maxillary sinus tenderness or frontal sinus tenderness.     Left Sinus: No maxillary sinus tenderness or frontal sinus tenderness.     Mouth/Throat:     Mouth: Mucous membranes are moist.     Pharynx: Oropharynx is clear. Uvula midline.  Eyes:     Conjunctiva/sclera: Conjunctivae normal.     Pupils: Pupils are equal, round, and  reactive to light.  Cardiovascular:     Rate and Rhythm: Normal rate and regular rhythm.  Pulmonary:     Effort: Pulmonary effort is normal. No accessory muscle usage, prolonged expiration, respiratory distress or retractions.     Breath sounds: No decreased air movement or transmitted upper airway sounds. No decreased breath sounds.     Comments: LCTAB Musculoskeletal:     Cervical back: Normal range of motion and neck supple.  Skin:    General: Skin is warm and dry.  Neurological:     Mental Status: He is alert and oriented to person, place, and time.      UC Treatments / Results  Labs (all labs ordered are listed, but only abnormal results are displayed) Labs Reviewed  NOVEL CORONAVIRUS, NAA    EKG   Radiology No results found.  Procedures Procedures (including critical care time)  Medications Ordered in UC Medications - No data to display  Initial Impression / Assessment and Plan / UC Course  I have reviewed the triage vital signs and the nursing notes.  Pertinent labs & imaging results that were available during my care of the patient were reviewed by me and considered in my medical decision making (see chart for details).    Covid testing ordered.  Discussed with patient, given symptom onset this morning, if Covid testing negative, but develops more symptoms, will need retesting.  Symptomatic treatment discussed.  Push fluids.  Return precautions given.  Patient expresses understanding and agrees plan.  Final Clinical Impressions(s) / UC Diagnoses   Final diagnoses:  Encounter for screening for COVID-19  Acute intractable headache, unspecified headache type  Exposure to COVID-19 virus    ED Prescriptions    Medication Sig Dispense Auth. Provider   fluticasone (FLONASE) 50 MCG/ACT nasal spray Place 2 sprays into both nostrils  daily. 1 g Belinda FisherYu, Llewelyn Sheaffer V, PA-C     PDMP not reviewed this encounter.   Belinda FisherYu, Priya Matsen V, PA-C 04/14/20 [id number]

## 2020-04-14 NOTE — ED Triage Notes (Signed)
Pt c/o fatigue and headache this am. States has a positive exposure at work and they want him covid tested.

## 2020-04-15 LAB — NOVEL CORONAVIRUS, NAA: SARS-CoV-2, NAA: NOT DETECTED

## 2020-04-15 LAB — SARS-COV-2, NAA 2 DAY TAT

## 2020-04-16 ENCOUNTER — Telehealth (INDEPENDENT_AMBULATORY_CARE_PROVIDER_SITE_OTHER): Payer: Managed Care, Other (non HMO) | Admitting: Family Medicine

## 2020-04-16 DIAGNOSIS — R5383 Other fatigue: Secondary | ICD-10-CM

## 2020-04-16 DIAGNOSIS — R509 Fever, unspecified: Secondary | ICD-10-CM

## 2020-04-16 DIAGNOSIS — R21 Rash and other nonspecific skin eruption: Secondary | ICD-10-CM | POA: Diagnosis not present

## 2020-04-16 MED ORDER — DOXYCYCLINE HYCLATE 100 MG PO TABS: 100.0000 mg | ORAL_TABLET | Freq: Two times a day (BID) | ORAL | 0 refills | Status: DC

## 2020-04-16 NOTE — Patient Instructions (Addendum)
-  I sent the medication(s) we discussed to your pharmacy: Meds ordered this encounter  Medications  . doxycycline (VIBRA-TABS) 100 MG tablet    Sig: Take 1 tablet (100 mg total) by mouth 2 (two) times daily.    Dispense:  14 tablet    Refill:  0    Follow up with Dr. Caryl Never next week, call tomorrow to schedule.  Monitor temperature.   I hope you are feeling better soon! Seek in person care promptly if your symptoms worsen, new concerns arise, fevers, spreading rash or redness, feeling sick or achy again or you are not improving with treatment.

## 2020-04-16 NOTE — Progress Notes (Signed)
Virtual Visit via Video Note  I connected with Jason [name]  on 04/16/20 at  3:40 PM EDT by a video enabled telemedicine application and verified that I am speaking with the correct person using two identifiers.  Location patient: home, [place] Location provider:work or home office Persons participating in the virtual visit: patient, provider, wife  I discussed the limitations of evaluation and management by telemedicine and the availability of in person appointments. The patient expressed understanding and agreed to proceed.   HPI:  Acute visit for skin rash: -started 2 days ago -symptoms include red rash on his R leg - firey red blotch on ant leg that is slightly edematous and hot to touch, tender - also had subjective fever, and felt tired and achy the first day, now improved -had a negative covid test the day he got sick -denies SOB, CP, cough, diarrhea, vomiting, tick exposure, rash elsewhere -reports he had the same thing happen 1 month but by the time he saw his PCP it was resolving -had remote bypass surgery on that leg -fully vaccinated for covid  ROS: See pertinent positives and negatives per HPI.  Past Medical History:  Diagnosis Date   Anxiety    Depression    Diabetes mellitus without complication (HCC)    GERD (gastroesophageal reflux disease)    Headache(784.0)    Hyperlipidemia    Hypertension    MRSA infection    Left shin, back   Nerve damage    BLE   Osteoarthritis    Peripheral vascular disease (HCC)    Pneumonia     Past Surgical History:  Procedure Laterality Date   ABDOMINAL AORTOGRAM W/LOWER EXTREMITY N/A 12/01/2017   Procedure: ABDOMINAL AORTOGRAM W/LOWER EXTREMITY;  Surgeon: Sherren Kerns, MD;  Location: [facility];  Service: Cardiovascular;  Laterality: N/A;   FEMORAL-POPLITEAL BYPASS GRAFT Right 12/11/2017   Procedure: BYPASS GRAFT RIGHT FEMORAL-ABOVE KNEE POPLITEAL ARTERY with Non Reversed Greater Saphenous Vein and Angioplasty  distal vein Graft.;  Surgeon: Sherren Kerns, MD;  Location: [facility] OR;  Service: Vascular;  Laterality: Right;   PERIPHERAL VASCULAR BALLOON ANGIOPLASTY Right 12/01/2017   Procedure: PERIPHERAL VASCULAR BALLOON ANGIOPLASTY;  Surgeon: Sherren Kerns, MD;  Location: [facility];  Service: Cardiovascular;  Laterality: Right;  superficial femoral, attempted   PERIPHERAL VASCULAR INTERVENTION Left 12/01/2017   Procedure: PERIPHERAL VASCULAR INTERVENTION;  Surgeon: Sherren Kerns, MD;  Location: [facility];  Service: Cardiovascular;  Laterality: Left;  External iliac   ROTATOR CUFF REPAIR     TONSILLECTOMY     VEIN HARVEST Right 12/11/2017   Procedure: RIGHT GREATER SAPHENOUS VEIN HARVEST;  Surgeon: Sherren Kerns, MD;  Location: [facility] OR;  Service: Vascular;  Laterality: Right;    Family History  Problem Relation Age of Onset   Heart attack Father 84   Hypertension Father    Arthritis Mother    Asthma Maternal Grandfather    Stroke Paternal Grandfather    Heart disease Other    Mental illness Other    Cancer Neg Hx    Diabetes Neg Hx    Drug abuse Neg Hx    Early death Neg Hx    Hearing loss Neg Hx    Hyperlipidemia Neg Hx     SOCIAL HX: see hpi   Current Outpatient Medications:    atorvastatin (LIPITOR) 40 MG tablet, Take 1 tablet (40 mg total) by mouth daily., Disp: 90 tablet, Rfl: 1   Blood Glucose Monitoring Suppl (  CONTOUR BLOOD GLUCOSE SYSTEM) DEVI, Use once daily for diabetes Dx E11.9, Disp: 1 Device, Rfl: 0   celecoxib (CELEBREX) 200 MG capsule, TAKE 1 CAPSULE BY MOUTH TWICE A DAY, Disp: 60 capsule, Rfl: 0   CONTOUR NEXT TEST test strip, TEST GLUCOSE ONCE DAILY. DX E 11.9, Disp: 100 strip, Rfl: 3   cyclobenzaprine (FLEXERIL) 10 MG tablet, Take 1 tablet (10 mg total) by mouth at bedtime., Disp: 30 tablet, Rfl: 1   doxycycline (VIBRA-TABS) 100 MG tablet, Take 1 tablet (100 mg total) by mouth 2 (two) times daily., Disp: 14 tablet, Rfl: 0    DULoxetine (CYMBALTA) 60 MG capsule, Take 1 capsule (60 mg total) by mouth daily., Disp: 90 capsule, Rfl: 3   ezetimibe (ZETIA) 10 MG tablet, Take 1 tablet (10 mg total) by mouth daily., Disp: 90 tablet, Rfl: 1   fluticasone (FLONASE) 50 MCG/ACT nasal spray, Place 2 sprays into both nostrils daily., Disp: 1 g, Rfl: 0   gabapentin (NEURONTIN) 300 MG capsule, TAKE ONE CAPSULE BY MOUTH EVERY MORNING AND TAKE 2 CAPSULES BY MOUTH AT BEDTIME, Disp: 270 capsule, Rfl: 3   JANUVIA 100 MG tablet, TAKE 1 TABLET BY MOUTH EVERY DAY, Disp: 90 tablet, Rfl: 3   JARDIANCE 10 MG TABS tablet, TAKE 1 TABLET BY MOUTH DAILY, Disp: 90 tablet, Rfl: 3   Lancets MISC, Contour Lancets.  Test glucose once daily. DxE11.9, Disp: 100 each, Rfl: 3   metFORMIN (GLUCOPHAGE) 500 MG tablet, TAKE 2 TABLETS BY MOUTH TWICE A DAY, Disp: 360 tablet, Rfl: 0   metoprolol succinate (TOPROL-XL) 25 MG 24 hr tablet, Take 1 tablet (25 mg total) by mouth daily., Disp: 90 tablet, Rfl: 2   Vitamin D, Ergocalciferol, (DRISDOL) 1.25 MG (50000 UT) CAPS capsule, TAKE 1 CAPSULE (50,000 UNITS TOTAL) BY MOUTH EVERY 7 (SEVEN) DAYS., Disp: 12 capsule, Rfl: 0  EXAM:  VITALS per patient if applicable:  GENERAL: alert, oriented, appears well and in no acute distress  HEENT: atraumatic, conjunttiva clear, no obvious abnormalities on inspection of external nose and ears  NECK: normal movements of the head and neck  LUNGS: on inspection no signs of respiratory distress, breathing rate appears normal, no obvious gross SOB, gasping or wheezing  CV: no obvious cyanosis  MS: moves all visible extremities without noticeable abnormality  SKIN: video visit quality not great but can see patch of erythema on anterior lower leg extending fom a few inches about the ankle to a few inches below the knee and around to the lateral and medial leg - leg does not appear swollen compared to the contralateral side. However patient reports rash area feels indurated  a little and warm to touch  PSYCH/NEURO: pleasant and cooperative, no obvious depression or anxiety, speech and thought processing grossly intact  ASSESSMENT AND PLAN:  Discussed the following assessment and plan:  Skin rash  Fatigue, unspecified type  Subjective fever  -we discussed possible serious and likely etiologies, options for evaluation and workup, limitations of telemedicine visit vs in person visit, treatment, treatment risks and precautions. Pt prefers to treat via telemedicine empirically rather then risking or undertaking an in person visit at this moment. Query cellulitis vs other. Concerned about him not feeling well the day it started - but he seems to be feeling better now other than the rash. Reports a similar episode recently. Query slowly brooding skin infection. Discussed possibility of deeper infection or other underlying systemic infection or inflammatory disease. He prefers to try empiric treatment with abx  with close follow up with PCP as he does not wish to see anyone else. Sent doxycycline 100mg  bid x 7 days. Sent message to PCP office to schedule close follow up early next week. Advise he check temperature if feeling sick, achy, etc.  Agrees to seek immediate in person care in the interim if worsening, new symptoms, fevers, feeling feverish or sick again or not improving on the abx.   I discussed the assessment and treatment plan with the patient. The patient was provided an opportunity to ask questions and all were answered. The patient agreed with the plan and demonstrated an understanding of the instructions.   The patient was advised to call back or seek an in-person evaluation if the symptoms worsen or if the condition fails to improve as anticipated.   , DO

## 2020-04-17 NOTE — Progress Notes (Signed)
Scheduled 04/24/2020 at 11:45 AM

## 2020-04-20 ENCOUNTER — Other Ambulatory Visit: Payer: Self-pay | Admitting: Family Medicine

## 2020-04-21 ENCOUNTER — Ambulatory Visit: Payer: Managed Care, Other (non HMO) | Admitting: Family Medicine

## 2020-04-21 ENCOUNTER — Other Ambulatory Visit: Payer: Self-pay

## 2020-04-24 ENCOUNTER — Ambulatory Visit: Payer: Managed Care, Other (non HMO) | Admitting: Family Medicine

## 2020-05-01 ENCOUNTER — Telehealth: Payer: Self-pay | Admitting: Physical Medicine and Rehabilitation

## 2020-05-01 NOTE — Telephone Encounter (Signed)
Patient called. He would like an appointment with Dr. Newton.  

## 2020-05-05 ENCOUNTER — Telehealth: Payer: Self-pay

## 2020-05-05 NOTE — Telephone Encounter (Signed)
Patient wife called he needs an appointment with Dr.Newton for neck pain call back [phone].

## 2020-05-06 NOTE — Telephone Encounter (Signed)
Needs auth for [id number]. Chart still says [employer], but has [place] card that was scanned in August. Sheduled for tomorrow.

## 2020-05-06 NOTE — Telephone Encounter (Signed)
Left C7-T1 IL on 6/2. Ok to repeat if helped, same problem/side, and no new injury?

## 2020-05-06 NOTE — Telephone Encounter (Signed)
Ok

## 2020-05-07 ENCOUNTER — Ambulatory Visit: Payer: Managed Care, Other (non HMO) | Admitting: Physical Medicine and Rehabilitation

## 2020-05-07 ENCOUNTER — Ambulatory Visit: Payer: Self-pay

## 2020-05-07 ENCOUNTER — Other Ambulatory Visit: Payer: Self-pay

## 2020-05-07 ENCOUNTER — Encounter: Payer: Self-pay | Admitting: Physical Medicine and Rehabilitation

## 2020-05-07 VITALS — BP 138/73 | HR 79

## 2020-05-07 DIAGNOSIS — M5412 Radiculopathy, cervical region: Secondary | ICD-10-CM

## 2020-05-07 DIAGNOSIS — M542 Cervicalgia: Secondary | ICD-10-CM

## 2020-05-07 MED ORDER — METHYLPREDNISOLONE ACETATE 80 MG/ML IJ SUSP
80.0000 mg | Freq: Once | INTRAMUSCULAR | Status: AC
  Administered 2020-05-07: 80 mg

## 2020-05-07 NOTE — Telephone Encounter (Signed)
Pt has been approve [id number]

## 2020-05-07 NOTE — Progress Notes (Signed)
Pt state neck pain. Pt state lookingup and down makes the pain worse. Pt state pain pills helps a little with pain. Pt state the pain keeps him up at night.  Numeric Pain Rating Scale and Functional Assessment Average Pain 4   In the last MONTH (on 0-10 scale) has pain interfered with the following?  1. General activity like being  able to carry out your everyday physical activities such as walking, climbing stairs, carrying groceries, or moving a chair?  Rating(9)   +Driver, -BT, -Dye Allergies.

## 2020-05-13 NOTE — Procedures (Signed)
Cervical Epidural Steroid Injection - Interlaminar Approach with Fluoroscopic Guidance  Patient: Jason [name]      Date of Birth: [birth year] MRN: [id number] PCP: [name], MD      Visit Date: 05/07/2020   Universal Protocol:    Date/Time: 09/15/215:32 AM  Consent Given By: the patient  Position: PRONE  Additional Comments: Vital signs were monitored before and after the procedure. Patient was prepped and draped in the usual sterile fashion. The correct patient, procedure, and site was verified.   Injection Procedure Details:  Procedure Site One Meds Administered:  Meds ordered this encounter  Medications  . methylPREDNISolone acetate (DEPO-MEDROL) injection 80 mg     Laterality: Left  Location/Site: C7-T1  Needle size: 20 G  Needle type: Touhy  Needle Placement: Paramedian epidural space  Findings:  -Comments: Excellent flow of contrast into the epidural space.  Procedure Details: Using a paramedian approach from the side mentioned above, the region overlying the inferior lamina was localized under fluoroscopic visualization and the soft tissues overlying this structure were infiltrated with 4 ml. of 1% Lidocaine without Epinephrine. A # 20 gauge, Tuohy needle was inserted into the epidural space using a paramedian approach.  The epidural space was localized using loss of resistance along with lateral and contralateral oblique bi-planar fluoroscopic views.  After negative aspirate for air, blood, and CSF, a 2 ml. volume of Isovue-250 was injected into the epidural space and the flow of contrast was observed. Radiographs were obtained for documentation purposes.   The injectate was administered into the level noted above.  Additional Comments:  The patient tolerated the procedure well Dressing: 2 x 2 sterile gauze and Band-Aid    Post-procedure details: Patient was observed during the procedure. Post-procedure instructions were reviewed.  Patient  left the clinic in stable condition.

## 2020-05-13 NOTE — Progress Notes (Signed)
Jason [name] - 54 y.o. male MRN [id number]  Date of birth: 16-Jul-1966  Office Visit Note: Visit Date: 05/07/2020 PCP: [name], MD Referred by: [name], MD  Subjective: Chief Complaint  Patient presents with  . Neck - Pain   HPI:  Jason [name] is a 54 y.o. male who comes in today for planned repeat Bilateral C7-T1 Cervical epidural steroid injection with fluoroscopic guidance.  The patient has failed conservative care including home exercise, medications, time and activity modification.  This injection will be diagnostic and hopefully therapeutic.  Please see requesting physician notes for further details and justification. Patient received more than 50% pain relief from prior injection.   Referring: Dr. Glee Arvin.  Original referring physician was Dr. Gaspar Bidding  Patient did get relief with injection in June.  He has had exacerbating symptoms over the last month.  He did get close to 2 months of relief and he typically gets longer relief.  A lot of his pain is centralized around the lower to mid cervical region.  Some referral into the trapezius and shoulder blades.  He does have some trigger points.  He has worsening pain with movement of the cervical spine indicating maybe the cervical spondylosis is somewhat a source of pain but he is done well with epidural injections in the past.  Plan at this point is to repeat the injection today.  Depending on relief with either look at potentially regrouping with Dr. Gaspar Bidding for osteopathic manipulation or possible trigger point injection by myself or dry needling with physical therapy.  I would entertain the idea of diagnostic medial branch blocks of the cervical spine and I went over this with him today.  This would be looking forward to possible radiofrequency ablation.  Lastly at some point may need to redo the MRI of the cervical spine his last MRI was in 2018 but has had no trauma since that time.  He did have  some narrowing at C6-7 that would be interesting to see if it has gotten any worse.   ROS Otherwise per HPI.  Assessment & Plan: Visit Diagnoses:  1. Cervical radiculopathy   2. Cervicalgia     Plan: No additional findings.   Meds & Orders:  Meds ordered this encounter  Medications  . methylPREDNISolone acetate (DEPO-MEDROL) injection 80 mg    Orders Placed This Encounter  Procedures  . XR C-ARM NO REPORT  . Epidural Steroid injection    Follow-up: Return if symptoms worsen or fail to improve.   Procedures: No procedures performed  Cervical Epidural Steroid Injection - Interlaminar Approach with Fluoroscopic Guidance  Patient: Jason [name]      Date of Birth: April 13, 1966 MRN: [id number] PCP: [name], MD      Visit Date: 05/07/2020   Universal Protocol:    Date/Time: 09/15/215:32 AM  Consent Given By: the patient  Position: PRONE  Additional Comments: Vital signs were monitored before and after the procedure. Patient was prepped and draped in the usual sterile fashion. The correct patient, procedure, and site was verified.   Injection Procedure Details:  Procedure Site One Meds Administered:  Meds ordered this encounter  Medications  . methylPREDNISolone acetate (DEPO-MEDROL) injection 80 mg     Laterality: Left  Location/Site: C7-T1  Needle size: 20 G  Needle type: Touhy  Needle Placement: Paramedian epidural space  Findings:  -Comments: Excellent flow of contrast into the epidural space.  Procedure Details: Using a paramedian approach  from the side mentioned above, the region overlying the inferior lamina was localized under fluoroscopic visualization and the soft tissues overlying this structure were infiltrated with 4 ml. of 1% Lidocaine without Epinephrine. A # 20 gauge, Tuohy needle was inserted into the epidural space using a paramedian approach.  The epidural space was localized using loss of resistance along with lateral and  contralateral oblique bi-planar fluoroscopic views.  After negative aspirate for air, blood, and CSF, a 2 ml. volume of Isovue-250 was injected into the epidural space and the flow of contrast was observed. Radiographs were obtained for documentation purposes.   The injectate was administered into the level noted above.  Additional Comments:  The patient tolerated the procedure well Dressing: 2 x 2 sterile gauze and Band-Aid    Post-procedure details: Patient was observed during the procedure. Post-procedure instructions were reviewed.  Patient left the clinic in stable condition.     Clinical History: MRI CERVICAL SPINE WITHOUT CONTRAST  TECHNIQUE: Multiplanar, multisequence MR imaging of the cervical spine was performed. No intravenous contrast was administered.  COMPARISON:  Outside exam 01/04/2011  FINDINGS: Alignment: Straightening of the normal cervical lordosis. One or 2 mm anterolisthesis C3-4. No significant curvature.  Vertebrae: Chronic discogenic endplate marrow changes C3-4 through C6-7.  Cord: No primary cord lesion.  See below for degenerative changes.  Posterior Fossa, vertebral arteries, paraspinal tissues: Negative. Insignificant posterior nasopharyngeal Thornwaldt cyst.  Disc levels:  Foramen magnum and C1-2 are normal.  C2-3: No disc pathology. Mild facet osteoarthritis but no stenosis or edema.  C3-4: Facet arthropathy on the left allowing 1 or 2 mm of anterolisthesis. Mild bulging of the disc. Foraminal stenosis on the left that could affect the C4 nerve. Foramen on the right widely patent.  C4-5: Mild disc bulge. Facet arthropathy on the left. No central canal stenosis. Foraminal narrowing on the left that could affect the C5 nerve.  C5-6: Bulging of the disc more towards the left. Facet arthropathy on the left. No compressive central canal stenosis. Foraminal stenosis on the left that could compress the C6 nerve.  C6-7:  Spondylosis with endplate osteophytes and protruding disc material slightly more prominent towards the right. Effacement of the ventral subarachnoid space but no compression of the cord. Bilateral foraminal stenosis that could affect either or both C7 nerves.  C7-T1:  Normal interspace.  IMPRESSION: In this patient with right-sided symptoms, the most concerning findings are at the C6-7 level were there is chronic spondylosis with endplate osteophytes and protruding disc material. Narrowing of the ventral subarachnoid space but no compression of the cord. Bilateral foraminal stenosis could compress either or both C7 nerves.  Left-sided predominant spondylosis and facet arthropathy at C3-4, C4-5 and C5-6 with left-sided foraminal narrowing at those levels that could possibly affect the C4, C5 and C6 nerves on the left. I do not see any right foraminal disease of significance in that region.  Compared to the outside exam, the findings appear similar, possibly minimally progressive over time.   Electronically Signed   By: Paulina Fusi M.D.   On: 07/28/2017 08:18     Objective:  VS:  HT:    WT:   BMI:     BP:138/73  HR:79bpm  TEMP: ( )  RESP:  Physical Exam Vitals and nursing note reviewed.  Constitutional:      General: He is not in acute distress.    Appearance: Normal appearance. He is not ill-appearing.  HENT:     Head: Normocephalic and atraumatic.  Right Ear: External ear normal.     Left Ear: External ear normal.  Eyes:     Extraocular Movements: Extraocular movements intact.  Cardiovascular:     Rate and Rhythm: Normal rate.     Pulses: Normal pulses.  Abdominal:     General: There is no distension.     Palpations: Abdomen is soft.  Musculoskeletal:        General: No signs of injury.     Cervical back: Neck supple. Tenderness present. No rigidity.     Right lower leg: No edema.     Left lower leg: No edema.     Comments: Patient has good  strength in the upper extremities with 5 out of 5 strength in wrist extension long finger flexion APB.  No intrinsic hand muscle atrophy.  Negative Hoffmann's test.  Lymphadenopathy:     Cervical: No cervical adenopathy.  Skin:    Findings: No erythema or rash.  Neurological:     General: No focal deficit present.     Mental Status: He is alert and oriented to person, place, and time.     Sensory: No sensory deficit.     Motor: No weakness or abnormal muscle tone.     Coordination: Coordination normal.  Psychiatric:        Mood and Affect: Mood normal.        Behavior: Behavior normal.      Imaging: No results found.

## 2020-05-14 ENCOUNTER — Other Ambulatory Visit: Payer: Self-pay | Admitting: Family Medicine

## 2020-05-31 ENCOUNTER — Other Ambulatory Visit: Payer: Self-pay | Admitting: Family Medicine

## 2020-06-09 ENCOUNTER — Other Ambulatory Visit: Payer: Self-pay | Admitting: Family Medicine

## 2020-06-17 ENCOUNTER — Other Ambulatory Visit: Payer: Self-pay

## 2020-06-17 ENCOUNTER — Encounter: Payer: Self-pay | Admitting: Family Medicine

## 2020-06-17 ENCOUNTER — Ambulatory Visit: Payer: Managed Care, Other (non HMO) | Admitting: Family Medicine

## 2020-06-17 VITALS — BP 124/70 | HR 63 | Temp 97.9°F | Ht 70.0 in | Wt 212.0 lb

## 2020-06-17 DIAGNOSIS — L03115 Cellulitis of right lower limb: Secondary | ICD-10-CM | POA: Diagnosis not present

## 2020-06-17 DIAGNOSIS — R21 Rash and other nonspecific skin eruption: Secondary | ICD-10-CM | POA: Diagnosis not present

## 2020-06-17 MED ORDER — DOXYCYCLINE HYCLATE 100 MG PO TABS
100.0000 mg | ORAL_TABLET | Freq: Two times a day (BID) | ORAL | 0 refills | Status: DC
Start: 2020-06-17 — End: 2020-08-10

## 2020-06-17 MED ORDER — MUPIROCIN 2 % EX OINT: TOPICAL_OINTMENT | CUTANEOUS | 0 refills | Status: DC

## 2020-06-17 NOTE — Patient Instructions (Signed)

## 2020-06-17 NOTE — Addendum Note (Signed)
Addended by: Lerry Liner on: 06/17/2020 10:48 AM   Modules accepted: Orders

## 2020-06-17 NOTE — Progress Notes (Signed)
Established Patient Office Visit  Subjective:  Patient ID: Jason [name], male    DOB: 04-24-1966  Age: 54 y.o. MRN: [id number]  CC:  Chief Complaint  Patient presents with  . Cellulitis    right leg  rash , fever   x3 in last few monnth     HPI [employer] presents for the following acute issues  Right leg rash.  He has redness and warmth.  He has had a couple days of intermittent fevers and chills.  He has had recurrent cellulitis he states this will be the third time in the past year.  He was treated back in August with doxycycline.  Rash did improve following that.  He does have past history of MRSA.  He is not any recent abscesses.  No obvious breaks in the skin.  Uses some type of liquid soap.  No recent nausea or vomiting.  He has skin rash right hand greater than the left.  He states this comes up as blisters and then they rupture and he has scaly residual rash.  Slightly pruritic.  Is not tried anything topical over-the-counter.  No other generalized rash.  Past Medical History:  Diagnosis Date  . Anxiety   . Depression   . Diabetes mellitus without complication (HCC)   . GERD (gastroesophageal reflux disease)   . Headache(784.0)   . Hyperlipidemia   . Hypertension   . MRSA infection    Left shin, back  . Nerve damage    BLE  . Osteoarthritis   . Peripheral vascular disease (HCC)   . Pneumonia     Past Surgical History:  Procedure Laterality Date  . ABDOMINAL AORTOGRAM W/LOWER EXTREMITY N/A 12/01/2017   Procedure: ABDOMINAL AORTOGRAM W/LOWER EXTREMITY;  Surgeon: Sherren KernsFields, Charles E, MD;  Location: [facility]AS[facility];  Service: Cardiovascular;  Laterality: N/A;  . FEMORAL-POPLITEAL BYPASS GRAFT Right 12/11/2017   Procedure: BYPASS GRAFT RIGHT FEMORAL-ABOVE KNEE POPLITEAL ARTERY with Non Reversed Greater Saphenous Vein and Angioplasty distal vein Graft.;  Surgeon: Sherren KernsFields, Charles E, MD;  [facility];  Service: Vascular;  Laterality: Right;  . PERIPHERAL  VASCULAR BALLOON ANGIOPLASTY Right 12/01/2017   Procedure: PERIPHERAL VASCULAR BALLOON ANGIOPLASTY;  Surgeon: Sherren KernsFields, Charles E, MD;  Location: [facility] INVAS[facility];  Service: Cardiovascular;  Laterality: Right;  superficial femoral, attempted  . PERIPHERAL VASCULAR INTERVENTION Left 12/01/2017   Procedure: PERIPHERAL VASCULAR INTERVENTION;  Surgeon: Sherren KernsFields, Charles E, MD;  [facility];  Service: Cardiovascular;  Laterality: Left;  External iliac  . ROTATOR CUFF REPAIR    . TONSILLECTOMY    . VEIN HARVEST Right 12/11/2017   Procedure: RIGHT GREATER SAPHENOUS VEIN HARVEST;  Surgeon: Sherren KernsFields, Charles E, MD;  [facility];  Service: Vascular;  Laterality: Right;    Family History  Problem Relation Age of Onset  . Heart attack Father [id number]  . Hypertension Father   . Arthritis Mother   . Asthma Maternal Grandfather   . Stroke Paternal Grandfather   . Heart disease Other   . Mental illness Other   . Cancer Neg Hx   . Diabetes Neg Hx   . Drug abuse Neg Hx   . Early death Neg Hx   . Hearing loss Neg Hx   . Hyperlipidemia Neg Hx     Social History   Socioeconomic History  . Marital status: Married    Spouse name: Not on file  . Number of children: Not on file  . Years  of education: Not on file  . Highest education level: Not on file  Occupational History  . Not on file  Tobacco Use  . Smoking status: Current Every Day Smoker    Packs/day: 2.00    Years: 25.00    Pack years: 50.00    Types: Cigarettes  . Smokeless tobacco: Never Used  . Tobacco comment: 30 cigarettes per day  Vaping Use  . Vaping Use: Never used  Substance and Sexual Activity  . Alcohol use: Not Currently    Alcohol/week: 20.0 standard drinks    Types: 20 Glasses of wine per week    Comment: not since Jan 2018  . Drug use: No  . Sexual activity: Not Currently  Other Topics Concern  . Not on file  Social History Narrative  . Not on file   Social Determinants of Health   Financial Resource  Strain:   . Difficulty of Paying Living Expenses: Not on file  Food Insecurity:   . Worried About Programme researcher, broadcasting/film/video in the Last Year: Not on file  . Ran Out of Food in the Last Year: Not on file  Transportation Needs:   . Lack of Transportation (Medical): Not on file  . Lack of Transportation (Non-Medical): Not on file  Physical Activity:   . Days of Exercise per Week: Not on file  . Minutes of Exercise per Session: Not on file  Stress:   . Feeling of Stress : Not on file  Social Connections:   . Frequency of Communication with Friends and Family: Not on file  . Frequency of Social Gatherings with Friends and Family: Not on file  . Attends Religious Services: Not on file  . Active Member of Clubs or Organizations: Not on file  . Attends Banker Meetings: Not on file  . Marital Status: Not on file  Intimate Partner Violence:   . Fear of Current or Ex-Partner: Not on file  . Emotionally Abused: Not on file  . Physically Abused: Not on file  . Sexually Abused: Not on file    Outpatient Medications Prior to Visit  Medication Sig Dispense Refill  . atorvastatin (LIPITOR) 40 MG tablet Take 1 tablet (40 mg total) by mouth daily. 90 tablet 1  . Blood Glucose Monitoring Suppl (CONTOUR BLOOD GLUCOSE SYSTEM) DEVI Use once daily for diabetes Dx E11.9 1 Device 0  . celecoxib (CELEBREX) 200 MG capsule TAKE 1 CAPSULE BY MOUTH TWICE A DAY 60 capsule 0  . CONTOUR NEXT TEST test strip TEST GLUCOSE ONCE DAILY. DX E 11.9 100 strip 3  . cyclobenzaprine (FLEXERIL) 10 MG tablet Take 1 tablet (10 mg total) by mouth at bedtime. 30 tablet 1  . doxycycline (VIBRA-TABS) 100 MG tablet Take 1 tablet (100 mg total) by mouth 2 (two) times daily. 14 tablet 0  . DULoxetine (CYMBALTA) 60 MG capsule Take 1 capsule (60 mg total) by mouth daily. 90 capsule 3  . ezetimibe (ZETIA) 10 MG tablet Take 1 tablet (10 mg total) by mouth daily. 90 tablet 1  . fluticasone (FLONASE) 50 MCG/ACT nasal spray Place 2  sprays into both nostrils daily. 1 g 0  . gabapentin (NEURONTIN) 300 MG capsule TAKE ONE CAPSULE BY MOUTH EVERY MORNING AND TAKE 2 CAPSULES BY MOUTH AT BEDTIME 270 capsule 1  . JANUVIA 100 MG tablet TAKE 1 TABLET BY MOUTH EVERY DAY 90 tablet 3  . JARDIANCE 10 MG TABS tablet TAKE 1 TABLET BY MOUTH DAILY 90 tablet 3  .  Lancets MISC Contour Lancets.  Test glucose once daily. DxE11.9 100 each 3  . metFORMIN (GLUCOPHAGE) 500 MG tablet TAKE 2 TABLETS BY MOUTH TWICE A DAY 360 tablet 0  . metoprolol succinate (TOPROL-XL) 25 MG 24 hr tablet Take 1 tablet (25 mg total) by mouth daily. 90 tablet 2  . Vitamin D, Ergocalciferol, (DRISDOL) 1.25 MG (50000 UT) CAPS capsule TAKE 1 CAPSULE (50,000 UNITS TOTAL) BY MOUTH EVERY 7 (SEVEN) DAYS. 12 capsule 0   No facility-administered medications prior to visit.    Allergies  Allergen Reactions  . Olmesartan Medoxomil Other (See Comments)    REACTION: dizziness    ROS Review of Systems  Constitutional: Positive for chills and fever.  Respiratory: Negative for shortness of breath.   Gastrointestinal: Negative for nausea and vomiting.  Skin: Positive for rash.  Neurological: Negative for dizziness.      Objective:    Physical Exam Vitals reviewed.  Constitutional:      General: He is not in acute distress.    Appearance: Normal appearance. He is not ill-appearing or toxic-appearing.  Cardiovascular:     Rate and Rhythm: Normal rate and regular rhythm.  Pulmonary:     Effort: Pulmonary effort is normal.  Musculoskeletal:     Cervical back: Neck supple.  Skin:    Findings: Rash present.     Comments: Right leg reveals erythema and warmth and tenderness involving most of the anterior right leg.  No skin lesions noted.  Right hand reveals scaly rash on the palm.  No visible vesicles at this time.  He has lesser involvement of the left palm.  Neurological:     Mental Status: He is alert.     BP 124/70 (BP Location: Left Arm, Patient Position:  Sitting, Cuff Size: Normal)   Pulse 63   Temp 97.9 F (36.6 C)   Ht 5\' 10"  (1.778 m)   Wt 212 lb (96.2 kg)   SpO2 96%   BMI 30.42 kg/m  Wt Readings from Last 3 Encounters:  06/17/20 212 lb (96.2 kg)  03/18/20 217 lb 11.2 oz (98.7 kg)  03/10/20 213 lb 8 oz (96.8 kg)     Health Maintenance Due  Topic Date Due  . Hepatitis C Screening  Never done  . FOOT EXAM  Never done  . OPHTHALMOLOGY EXAM  Never done  . URINE MICROALBUMIN  Never done  . COVID-19 Vaccine (1) Never done  . HIV Screening  Never done  . TETANUS/TDAP  08/29/2012  . COLONOSCOPY  Never done    There are no preventive care reminders to display for this patient.  Lab Results  Component Value Date   TSH 0.58 03/10/2020   Lab Results  Component Value Date   WBC 13.7 (H) 03/10/2020   HGB 16.8 03/10/2020   HCT 50.2 (H) 03/10/2020   MCV 86.1 03/10/2020   PLT 282 03/10/2020   Lab Results  Component Value Date   NA 136 03/10/2020   K 4.0 03/10/2020   CO2 23 03/10/2020   GLUCOSE 253 (H) 03/10/2020   BUN 9 03/10/2020   CREATININE 0.82 03/10/2020   BILITOT 0.5 03/10/2020   ALKPHOS 95 12/28/2017   AST 14 03/10/2020   ALT 18 03/10/2020   PROT 6.8 03/10/2020   ALBUMIN 4.5 12/28/2017   CALCIUM 9.4 03/10/2020   ANIONGAP 10 12/12/2017   GFR 106.56 12/28/2017   Lab Results  Component Value Date   CHOL 157 03/10/2020   Lab Results  Component Value Date  HDL 33 (L) 03/10/2020   Lab Results  Component Value Date   LDLCALC 99 03/10/2020   Lab Results  Component Value Date   TRIG 157 (H) 03/10/2020   Lab Results  Component Value Date   CHOLHDL 4.8 03/10/2020   Lab Results  Component Value Date   HGBA1C 7.8 (H) 03/10/2020      Assessment & Plan:   #1 recurrent cellulitis right leg.  Past history of MRSA.  Patient nontoxic in appearance.  -Elevate legs frequently -Start doxycycline 100 mg twice daily for 10 days -Consider intranasal Bactroban ointment twice daily for 5 days -Recommend  good antibacterial soap such as Lever 2000 or Dial and wash skin with washcloth daily with changing cloth each day -Follow-up immediately for increased fever or progressive rash or if this is not fully clearing in the next 10 days  #2 skin rash right hand greater than left.  This involves the palm.  Differential is dyshidrosis, pustular eczema, fungal  -Scraping obtained for KOH and fungal culture -Wait on treatment pending KOH and culture  Meds ordered this encounter  Medications  . doxycycline (VIBRA-TABS) 100 MG tablet    Sig: Take 1 tablet (100 mg total) by mouth 2 (two) times daily.    Dispense:  20 tablet    Refill:  0  . mupirocin ointment (BACTROBAN) 2 %    Sig: Apply intranasal twice daily for 5 days    Dispense:  22 g    Refill:  0    Follow-up: No follow-ups on file.    Evelena Peat, MD

## 2020-06-22 ENCOUNTER — Telehealth: Payer: Self-pay | Admitting: Family Medicine

## 2020-06-22 ENCOUNTER — Other Ambulatory Visit: Payer: Self-pay | Admitting: Family Medicine

## 2020-06-22 NOTE — Telephone Encounter (Signed)
See result note.  

## 2020-06-22 NOTE — Telephone Encounter (Signed)
Patient returned Rachels call from Friday.  He would like a call back today

## 2020-06-25 ENCOUNTER — Other Ambulatory Visit: Payer: Self-pay | Admitting: Family Medicine

## 2020-06-25 DIAGNOSIS — E7801 Familial hypercholesterolemia: Secondary | ICD-10-CM

## 2020-06-25 DIAGNOSIS — E785 Hyperlipidemia, unspecified: Secondary | ICD-10-CM

## 2020-06-28 MED ORDER — BETAMETHASONE DIPROPIONATE AUG 0.05 % EX OINT: TOPICAL_OINTMENT | CUTANEOUS | 1 refills | Status: DC

## 2020-06-28 NOTE — Addendum Note (Signed)
Addended by: [name] on: 06/28/2020 07:33 PM   Modules accepted: Orders

## 2020-07-08 ENCOUNTER — Other Ambulatory Visit: Payer: Self-pay | Admitting: Family Medicine

## 2020-07-16 LAB — FUNGUS CULTURE W SMEAR
CULTURE:: NO GROWTH
MICRO NUMBER:: 11095946
SMEAR:: NONE SEEN
SPECIMEN QUALITY:: ADEQUATE

## 2020-07-17 NOTE — Progress Notes (Signed)
Fungal culture negative.

## 2020-08-01 ENCOUNTER — Other Ambulatory Visit: Payer: Self-pay | Admitting: Family Medicine

## 2020-08-02 ENCOUNTER — Encounter: Payer: Self-pay | Admitting: Family Medicine

## 2020-08-03 ENCOUNTER — Other Ambulatory Visit: Payer: Self-pay | Admitting: Family Medicine

## 2020-08-03 NOTE — Telephone Encounter (Signed)
May refill but I would change prescription to take 1 tablet once daily

## 2020-08-04 ENCOUNTER — Encounter: Payer: Self-pay | Admitting: Family Medicine

## 2020-08-04 NOTE — Telephone Encounter (Signed)
Refill for 6 months. 

## 2020-08-08 ENCOUNTER — Other Ambulatory Visit: Payer: Self-pay | Admitting: Family Medicine

## 2020-08-10 ENCOUNTER — Ambulatory Visit: Payer: Managed Care, Other (non HMO) | Admitting: Family Medicine

## 2020-08-10 ENCOUNTER — Other Ambulatory Visit: Payer: Self-pay

## 2020-08-10 ENCOUNTER — Encounter: Payer: Self-pay | Admitting: Family Medicine

## 2020-08-10 VITALS — BP 128/68 | HR 81 | Temp 97.8°F | Ht 70.0 in | Wt 221.2 lb

## 2020-08-10 DIAGNOSIS — E1165 Type 2 diabetes mellitus with hyperglycemia: Secondary | ICD-10-CM | POA: Diagnosis not present

## 2020-08-10 DIAGNOSIS — E1159 Type 2 diabetes mellitus with other circulatory complications: Secondary | ICD-10-CM

## 2020-08-10 DIAGNOSIS — G549 Nerve root and plexus disorder, unspecified: Secondary | ICD-10-CM

## 2020-08-10 DIAGNOSIS — M542 Cervicalgia: Secondary | ICD-10-CM

## 2020-08-10 MED ORDER — AMITRIPTYLINE HCL 25 MG PO TABS: 25.0000 mg | ORAL_TABLET | Freq: Every day | ORAL | 1 refills | Status: DC

## 2020-08-10 NOTE — Progress Notes (Signed)
Established Patient Office Visit  Subjective:  Patient ID: Jason [name], male    DOB: 09-14-1965  Age: 54 y.o. MRN: [id number]  CC:  Chief Complaint  Patient presents with  . Neck Pain  . Legs and feet pain    HPI Jason [name] presents for medical follow-up.  He called recently with increased neuropathy pains.  He has bilateral burning-like dysesthesias and pain in both legs especially at night.  Symptoms are worse when he is at rest and actually improved with ambulation.  Progressive over recent months. He does have history of peripheral artery disease but states these symptoms are totally different.    He does have poorly controlled type 2 diabetes which is certainly a risk factor but also past history of alcohol abuse which could be contributing.  He is currently on fairly high-dose gabapentin along with Cymbalta with very poor control.  Takes Cymbalta 60 mg.  Takes gabapentin 300 mg in the morning and 600 mg at night.  Still has severe pain which is frequently waking him.  Last A1c was 7.8%.  He states he is compliant with medications.  Poor compliance with diet.  His other main complaint is chronic neck pain.  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  He has chronic daily neck pain.  He is seen physical medicine specialist who has had multiple injections in his neck without much improvement.  He would like to get another opinion.  He has chronic stiffness.  Last MRI scan apparently back in 2018 which showed degenerative spondylosis changes at multiple levels.  Denies any numbness or weakness upper extremities currently.  Past Medical History:  Diagnosis Date  . Anxiety   . Depression   . Diabetes mellitus without complication (HCC)   . GERD (gastroesophageal reflux disease)   . Headache(784.0)   . Hyperlipidemia   . Hypertension   . MRSA infection    Left shin, back  . Nerve damage    BLE   . Osteoarthritis   . Peripheral vascular disease (HCC)   . Pneumonia     Past Surgical History:  Procedure Laterality Date  . ABDOMINAL AORTOGRAM W/LOWER EXTREMITY N/A 12/01/2017   Procedure: ABDOMINAL AORTOGRAM W/LOWER EXTREMITY;  Surgeon: Sherren Kerns, MD;  Location: [facility] INVASIVE CV LAB;  Service: Cardiovascular;  Laterality: N/A;  . FEMORAL-POPLITEAL BYPASS GRAFT Right 12/11/2017   Procedure: BYPASS GRAFT RIGHT FEMORAL-ABOVE KNEE POPLITEAL ARTERY with Non Reversed Greater Saphenous Vein and Angioplasty distal vein Graft.;  Surgeon: Sherren Kerns, MD;  Location: [facility] OR;  Service: Vascular;  Laterality: Right;  . PERIPHERAL VASCULAR BALLOON ANGIOPLASTY Right 12/01/2017   Procedure: PERIPHERAL VASCULAR BALLOON ANGIOPLASTY;  Surgeon: Sherren Kerns, MD;  Location: [facility] INVASIVE CV LAB;  Service: Cardiovascular;  Laterality: Right;  superficial femoral, attempted  . PERIPHERAL VASCULAR INTERVENTION Left 12/01/2017   Procedure: PERIPHERAL VASCULAR INTERVENTION;  Surgeon: Sherren Kerns, MD;  Location: [facility] INVASIVE CV LAB;  Service: Cardiovascular;  Laterality: Left;  External iliac  . ROTATOR CUFF REPAIR    . TONSILLECTOMY    . VEIN HARVEST Right 12/11/2017   Procedure: RIGHT GREATER SAPHENOUS VEIN HARVEST;  Surgeon: Sherren Kerns, MD;  Location: [facility] OR;  Service: Vascular;  Laterality: Right;    Family History  Problem Relation Age of Onset  . Heart attack Father 39  . Hypertension Father   . Arthritis Mother   . Asthma Maternal  Grandfather   . Stroke Paternal Grandfather   . Heart disease Other   . Mental illness Other   . Cancer Neg Hx   . Diabetes Neg Hx   . Drug abuse Neg Hx   . Early death Neg Hx   . Hearing loss Neg Hx   . Hyperlipidemia Neg Hx     Social History   Socioeconomic History  . Marital status: Married    Spouse name: Not on file  . Number of children: Not on file  . Years of education: Not on file  . Highest education level: Not on file   Occupational History  . Not on file  Tobacco Use  . Smoking status: Current Every Day Smoker    Packs/day: 2.00    Years: 25.00    Pack years: 50.00    Types: Cigarettes  . Smokeless tobacco: Never Used  . Tobacco comment: 30 cigarettes per day  Vaping Use  . Vaping Use: Never used  Substance and Sexual Activity  . Alcohol use: Not Currently    Alcohol/week: 20.0 standard drinks    Types: 20 Glasses of wine per week    Comment: not since Jan 2018  . Drug use: No  . Sexual activity: Not Currently  Other Topics Concern  . Not on file  Social History Narrative  . Not on file   Social Determinants of Health   Financial Resource Strain: Not on file  Food Insecurity: Not on file  Transportation Needs: Not on file  Physical Activity: Not on file  Stress: Not on file  Social Connections: Not on file  Intimate Partner Violence: Not on file    Outpatient Medications Prior to Visit  Medication Sig Dispense Refill  . atorvastatin (LIPITOR) 40 MG tablet TAKE 1 TABLET BY MOUTH EVERY DAY 90 tablet 1  . Blood Glucose Monitoring Suppl (CONTOUR BLOOD GLUCOSE SYSTEM) DEVI Use once daily for diabetes Dx E11.9 1 Device 0  . celecoxib (CELEBREX) 200 MG capsule Take 1 capsule (200 mg total) by mouth daily. 30 capsule 0  . CONTOUR NEXT TEST test strip TEST GLUCOSE ONCE DAILY. DX E 11.9 100 strip 3  . DULoxetine (CYMBALTA) 60 MG capsule Take 1 capsule (60 mg total) by mouth daily. 90 capsule 3  . ezetimibe (ZETIA) 10 MG tablet TAKE 1 TABLET BY MOUTH EVERY DAY 90 tablet 1  . gabapentin (NEURONTIN) 300 MG capsule TAKE ONE CAPSULE BY MOUTH EVERY MORNING AND TAKE 2 CAPSULES BY MOUTH AT BEDTIME 270 capsule 1  . JANUVIA 100 MG tablet TAKE 1 TABLET BY MOUTH EVERY DAY 90 tablet 3  . JARDIANCE 10 MG TABS tablet TAKE 1 TABLET BY MOUTH DAILY 90 tablet 3  . Lancets MISC Contour Lancets.  Test glucose once daily. DxE11.9 100 each 3  . metFORMIN (GLUCOPHAGE) 500 MG tablet TAKE 2 TABLETS BY MOUTH TWICE A  DAY 360 tablet 0  . metoprolol succinate (TOPROL-XL) 25 MG 24 hr tablet Take 1 tablet (25 mg total) by mouth daily. 90 tablet 2  . traZODone (DESYREL) 50 MG tablet TAKE 1 TABLET BY MOUTH AT BEDTIME AS NEEDED FOR SLEEP 90 tablet 1  . augmented betamethasone dipropionate (DIPROLENE) 0.05 % ointment Apply to affected hand rash bid prn- no longer than 2 weeks of continuous use. 30 g 1  . cyclobenzaprine (FLEXERIL) 10 MG tablet Take 1 tablet (10 mg total) by mouth at bedtime. 30 tablet 1  . doxycycline (VIBRA-TABS) 100 MG tablet Take 1 tablet (100 mg  total) by mouth 2 (two) times daily. 14 tablet 0  . doxycycline (VIBRA-TABS) 100 MG tablet Take 1 tablet (100 mg total) by mouth 2 (two) times daily. 20 tablet 0  . fluticasone (FLONASE) 50 MCG/ACT nasal spray Place 2 sprays into both nostrils daily. 1 g 0  . mupirocin ointment (BACTROBAN) 2 % Apply intranasal twice daily for 5 days 22 g 0  . Vitamin D, Ergocalciferol, (DRISDOL) 1.25 MG (50000 UT) CAPS capsule TAKE 1 CAPSULE (50,000 UNITS TOTAL) BY MOUTH EVERY 7 (SEVEN) DAYS. 12 capsule 0   No facility-administered medications prior to visit.    Allergies  Allergen Reactions  . Olmesartan Medoxomil Other (See Comments)    REACTION: dizziness    ROS Review of Systems  Constitutional: Negative for chills and fever.  Respiratory: Negative for cough.   Cardiovascular: Negative for chest pain.  Musculoskeletal: Positive for neck pain and neck stiffness.  Neurological: Negative for weakness.      Objective:    Physical Exam Vitals reviewed.  Constitutional:      Appearance: Normal appearance.  Cardiovascular:     Rate and Rhythm: Normal rate and regular rhythm.  Pulmonary:     Effort: Pulmonary effort is normal.     Breath sounds: Normal breath sounds.  Neurological:     Mental Status: He is alert.     BP 128/68 (BP Location: Left Arm, Patient Position: Sitting, Cuff Size: Large)   Pulse 81   Temp 97.8 F (36.6 C) (Oral)   Ht 5'  10" (1.778 m)   Wt 221 lb 4 oz (100.4 kg)   SpO2 95%   BMI 31.75 kg/m  Wt Readings from Last 3 Encounters:  08/10/20 221 lb 4 oz (100.4 kg)  06/17/20 212 lb (96.2 kg)  03/18/20 217 lb 11.2 oz (98.7 kg)     Health Maintenance Due  Topic Date Due  . Hepatitis C Screening  Never done  . PNEUMOCOCCAL POLYSACCHARIDE VACCINE AGE 34-64 HIGH RISK  Never done  . FOOT EXAM  Never done  . OPHTHALMOLOGY EXAM  Never done  . URINE MICROALBUMIN  Never done  . COVID-19 Vaccine (1) Never done  . HIV Screening  Never done  . TETANUS/TDAP  08/29/2012  . COLONOSCOPY  Never done    There are no preventive care reminders to display for this patient.  Lab Results  Component Value Date   TSH 0.58 03/10/2020   Lab Results  Component Value Date   WBC 13.7 (H) 03/10/2020   HGB 16.8 03/10/2020   HCT 50.2 (H) 03/10/2020   MCV 86.1 03/10/2020   PLT 282 03/10/2020   Lab Results  Component Value Date   NA 136 03/10/2020   K 4.0 03/10/2020   CO2 23 03/10/2020   GLUCOSE 253 (H) 03/10/2020   BUN 9 03/10/2020   CREATININE 0.82 03/10/2020   BILITOT 0.5 03/10/2020   ALKPHOS 95 12/28/2017   AST 14 03/10/2020   ALT 18 03/10/2020   PROT 6.8 03/10/2020   ALBUMIN 4.5 12/28/2017   CALCIUM 9.4 03/10/2020   ANIONGAP 10 12/12/2017   GFR 106.56 12/28/2017   Lab Results  Component Value Date   CHOL 157 03/10/2020   Lab Results  Component Value Date   HDL 33 (L) 03/10/2020   Lab Results  Component Value Date   LDLCALC 99 03/10/2020   Lab Results  Component Value Date   TRIG 157 (H) 03/10/2020   Lab Results  Component Value Date   CHOLHDL 4.8  03/10/2020   Lab Results  Component Value Date   HGBA1C 7.8 (H) 03/10/2020      Assessment & Plan:   #1 severe peripheral neuropathy pains lower extremities especially at night.  Possibly related to diabetes and also possibly related to prior history of alcohol abuse.  Recent B 12 level normal.  Previous thyroid functions normal.  -Continue  gabapentin and Cymbalta -We discussed addition of Elavil 25 mg nightly.  We reviewed potential side effects including sedation.  Would not plan to go with higher doses because of his current Cymbalta use. -Would like to definitely try to avoid opioid use with his prior history of alcohol abuse  #2 chronic neck pain.  Patient requesting referral to orthopedic back specialist. - he has some celebrex and encourage get back on one daily. -set up referral per his request -may need repeat MRI to c/w previous.  #3 type 2 DM with hx of poor control.   Discussed repeat A1C today and he requests defer to next visit.   Meds ordered this encounter  Medications  . amitriptyline (ELAVIL) 25 MG tablet    Sig: Take 1 tablet (25 mg total) by mouth at bedtime.    Dispense:  30 tablet    Refill:  1    Follow-up: Return in about 1 month (around 09/10/2020).    Evelena Peat, MD

## 2020-08-10 NOTE — Patient Instructions (Signed)
STOP the Trazodone  Continue the Gabapentin and Cymbalta  Take the amitrityline at night- may cause some sedation.

## 2020-08-12 IMAGING — CT CT LUMBAR SPINE WITHOUT CONTRAST
3 of 4 series · 13 of 33 positions shown, 16 images · non-contrast
Comparison: None.

CLINICAL DATA: States he was hitching his boat this am and the boat
fell and he injured his back states the pain radiates down his leg
and its very painful to walk

EXAM:
CT LUMBAR SPINE WITHOUT CONTRAST
TECHNIQUE: Multidetector CT imaging of the lumbar spine was performed without
intravenous contrast administration. Multiplanar CT image
reconstructions were also generated.

[Series 5: l-spine 2.0 st · axial · 0.52mm/px · z∈[+872,+1102]mm · 5 of 163 slices shown, 7 images]
[im 24/163  soft-tissue]
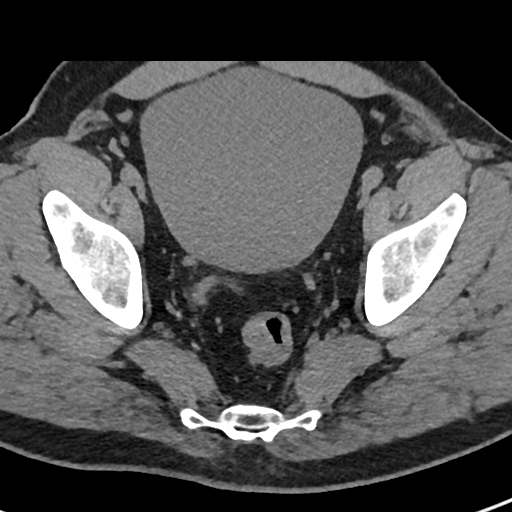
[im 24/163  bone]
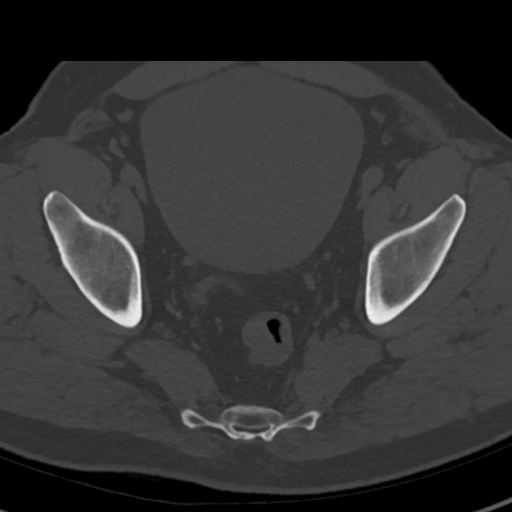
[im 47/163  bone]
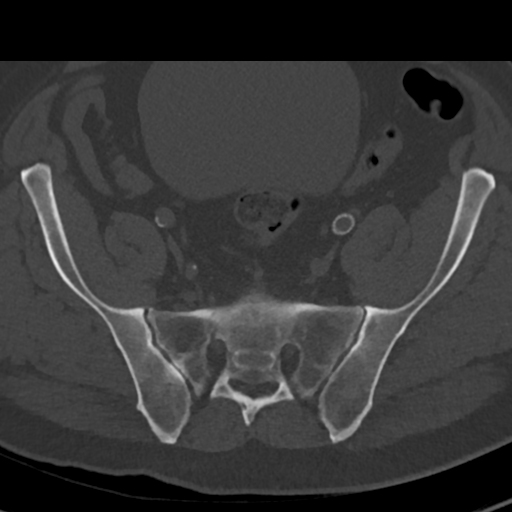
[im 93/163  bone]
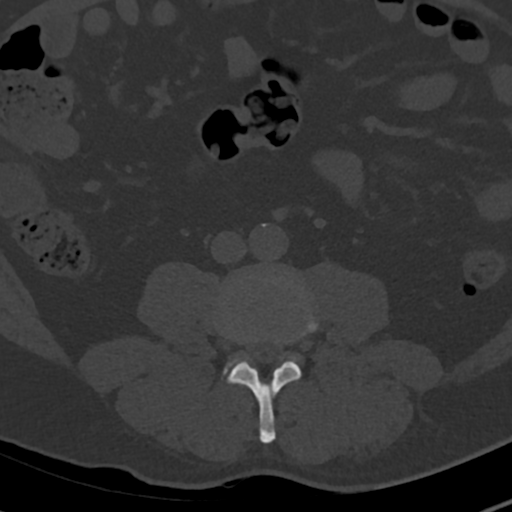
[im 116/163  bone]
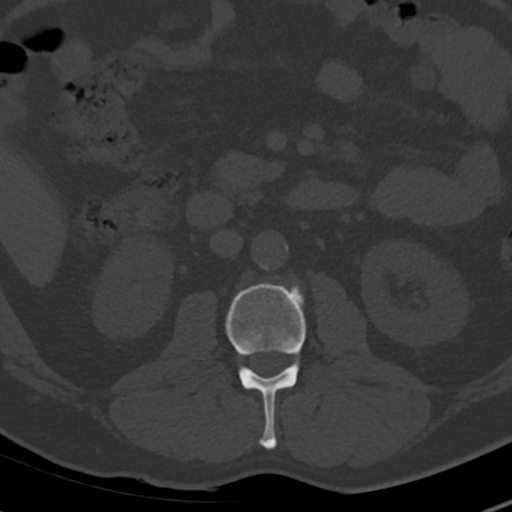
[im 139/163  soft-tissue]
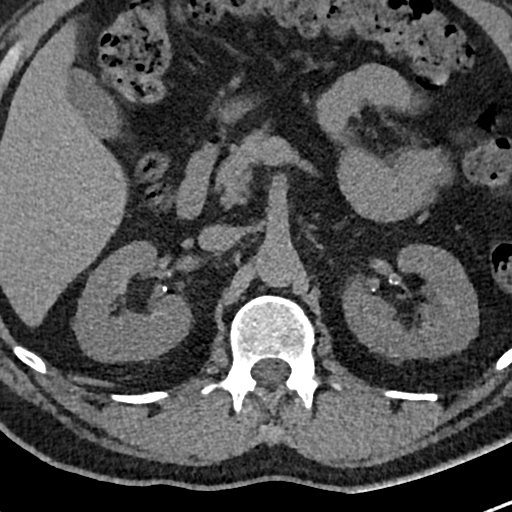
[im 139/163  bone]
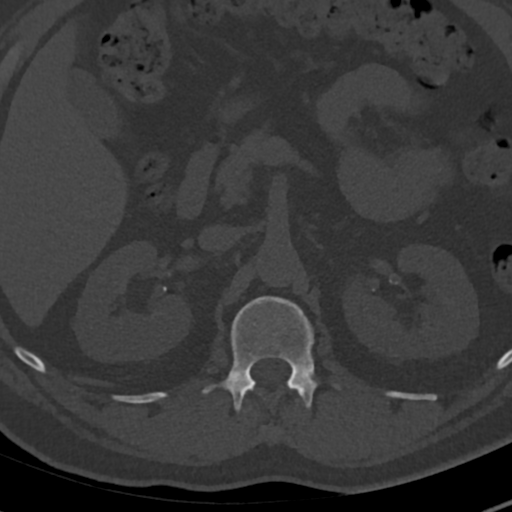

[Series 7: l-spine 2.0 cor bone · coronal · 0.48mm/px · 3 of 126 slices shown]
[im 26/126  bone]
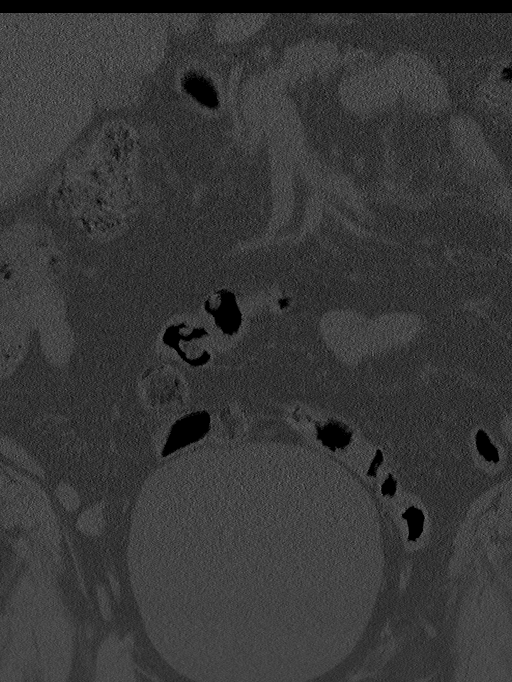
[im 51/126  bone]
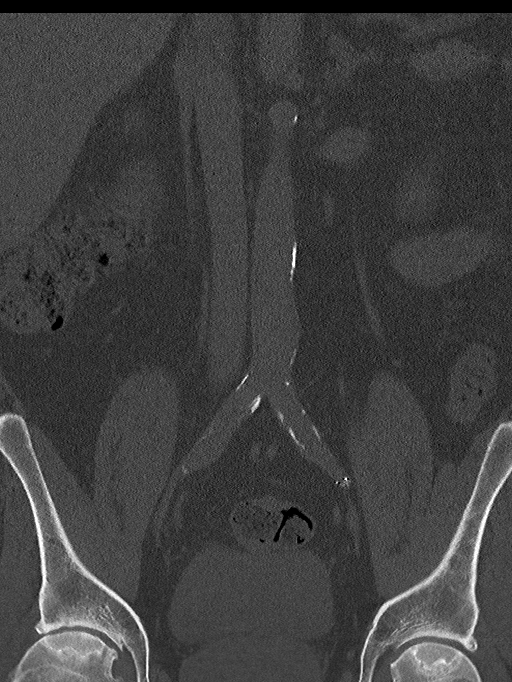
[im 76/126  bone]
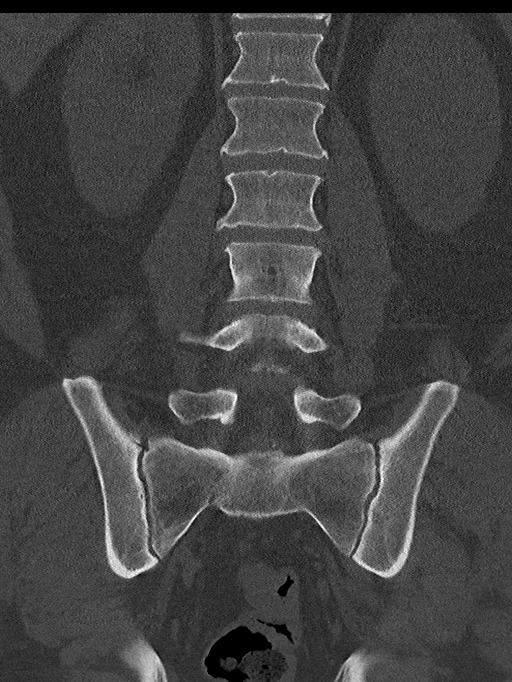

[Series 8: l-spine 2.0 sag bone · sagittal · 0.48mm/px · 5 of 93 slices shown, 6 images]
[im 31/93  bone]
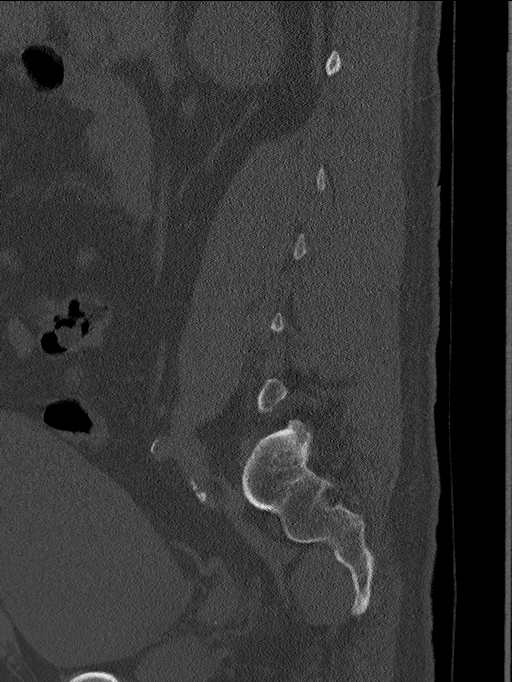
[im 39/93  bone]
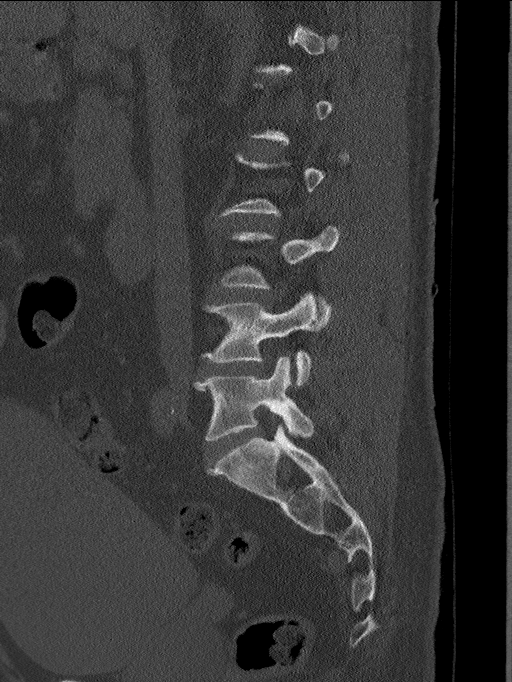
[im 47/93  soft-tissue]
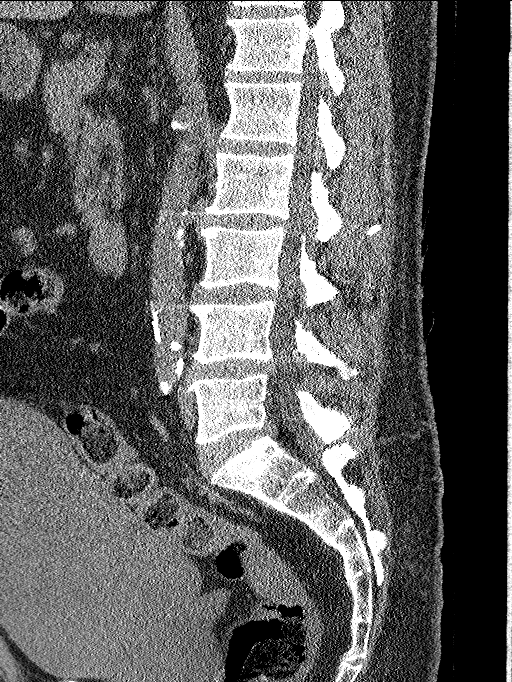
[im 47/93  bone]
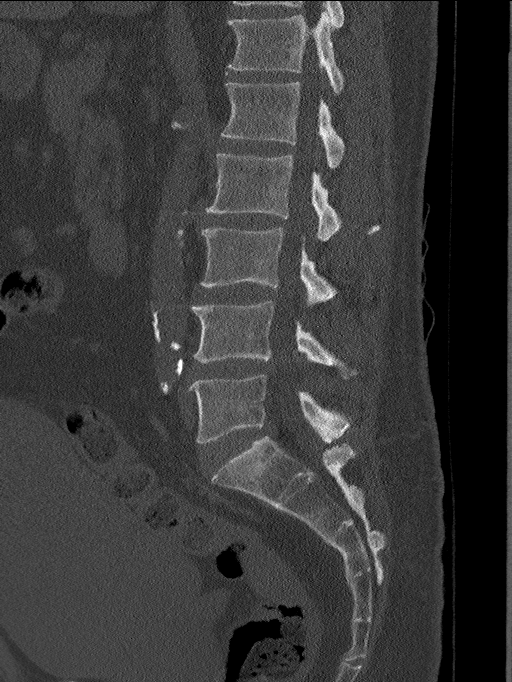
[im 54/93  bone]
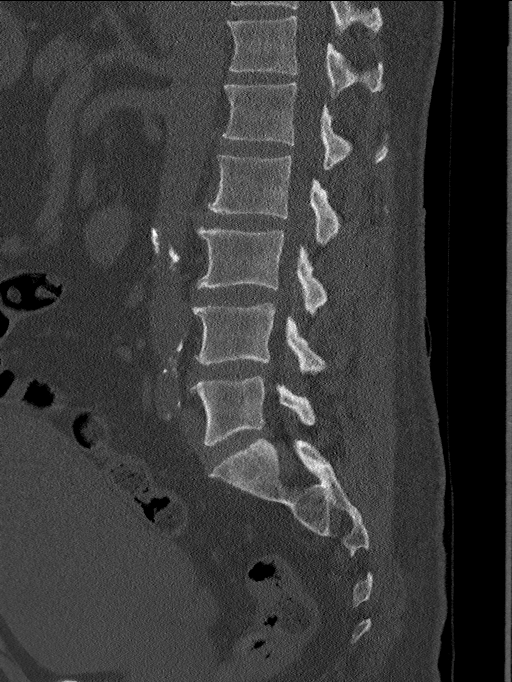
[im 62/93  bone]
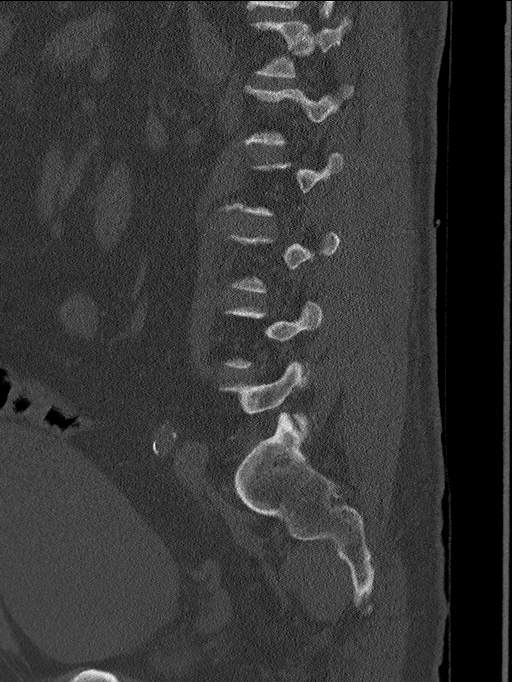

[13 of 33 positions shown; findings below may reference images not displayed]

FINDINGS: Segmentation: 5 lumbar type vertebrae.

Alignment: Normal.

Vertebrae: No acute fracture or focal pathologic process.

Other: Mild osteoarthritis of bilateral SI joints. Subchondral
serpiginous signal abnormality with associated sclerosis in the
superior femoral heads bilaterally consistent with a vascular
necrosis. Moderate osteoarthritis of the right hip. Mild
osteoarthritis of the left hip.

Paraspinal and other soft tissues: No acute paraspinal abnormality.
Abdominal aortic atherosclerosis. Visualized bowel demonstrates no
focal abnormality. Enlarged prostate gland.

Disc levels:

Mild degenerative disease with disc height loss at T12-L1.

T12-L1: No disc protrusion, foraminal stenosis or central canal
stenosis.

L1-L2: No disc protrusion, foraminal stenosis or central canal
stenosis.

L2-L3: No disc protrusion, foraminal stenosis or central canal
stenosis.

L3-L4: No disc protrusion, foraminal stenosis or central canal
stenosis. Mild bilateral facet arthropathy.

L4-L5: Mild broad-based disc bulge. Mild bilateral facet
arthropathy. No foraminal or central canal stenosis.

L5-S1: Broad-based disc bulge with a small left paracentral disc
protrusion. Mild bilateral facet arthropathy. No foraminal stenosis.
IMPRESSION: 1.  No acute osseous injury of the lumbar spine.
2. Avascular necrosis of bilateral femoral heads. Moderate
osteoarthritis of the right hip. Mild osteoarthritis of the left
hip.

## 2020-09-01 ENCOUNTER — Other Ambulatory Visit: Payer: Self-pay | Admitting: Family Medicine

## 2020-10-14 ENCOUNTER — Encounter: Payer: Self-pay | Admitting: Family Medicine

## 2020-10-14 ENCOUNTER — Other Ambulatory Visit: Payer: Self-pay

## 2020-10-14 MED ORDER — CELECOXIB 200 MG PO CAPS: 200.0000 mg | ORAL_CAPSULE | Freq: Every day | ORAL | 0 refills | Status: DC

## 2020-10-26 ENCOUNTER — Other Ambulatory Visit: Payer: Self-pay | Admitting: Family Medicine

## 2020-10-27 ENCOUNTER — Telehealth: Payer: Self-pay | Admitting: Family Medicine

## 2020-10-27 ENCOUNTER — Other Ambulatory Visit: Payer: Self-pay

## 2020-10-27 MED ORDER — METFORMIN HCL 500 MG PO TABS: 1000.0000 mg | ORAL_TABLET | Freq: Two times a day (BID) | ORAL | 0 refills | Status: DC

## 2020-10-27 MED ORDER — DULOXETINE HCL 60 MG PO CPEP: 60.0000 mg | ORAL_CAPSULE | Freq: Every day | ORAL | 0 refills | Status: DC

## 2020-10-27 MED ORDER — CELECOXIB 200 MG PO CAPS: 200.0000 mg | ORAL_CAPSULE | Freq: Every day | ORAL | 1 refills | Status: DC

## 2020-10-27 MED ORDER — GABAPENTIN 300 MG PO CAPS
ORAL_CAPSULE | ORAL | 3 refills | Status: DC
Start: 2020-10-27 — End: 2021-08-09

## 2020-10-27 MED ORDER — GABAPENTIN 300 MG PO CAPS: ORAL_CAPSULE | ORAL | 0 refills | Status: DC

## 2020-10-27 NOTE — Telephone Encounter (Signed)
Pts spouse is calling in stating that the Rx gabapentin (NEURONTIN) 300 MG the pharmacy is needing a new Rx stating that the pt takes 2 in the morning and 2 at night and stated that the one at the pharmacy needs to be cancelled.  Pharm:  [facility] on [address]

## 2020-10-27 NOTE — Addendum Note (Signed)
Addended by: [name] on: 10/27/2020 05:24 PM   Modules accepted: Orders

## 2020-11-18 ENCOUNTER — Telehealth: Payer: Self-pay | Admitting: Family Medicine

## 2020-11-18 MED ORDER — LORAZEPAM 1 MG PO TABS
ORAL_TABLET | ORAL | 0 refills | Status: DC
Start: 2020-11-18 — End: 2021-06-14

## 2020-11-18 NOTE — Telephone Encounter (Signed)
Patient wife is calling and stated that he has to get a MRI on his neck on 3/28 at [facility] and wanted to see if provider can give him something to calm his nerves, please advise. CB is [phone]

## 2020-11-18 NOTE — Telephone Encounter (Signed)
Rx sent in

## 2020-11-18 NOTE — Telephone Encounter (Signed)
May send in lorazepam 1 mg take 1 tablet 1 hour prior to procedure.  Dispense 1 tablet

## 2020-12-08 ENCOUNTER — Telehealth: Payer: Self-pay

## 2020-12-08 NOTE — Telephone Encounter (Signed)
Patient had MRI through [facility]. Scheduled for OV and patient will bring records and MRI.

## 2020-12-08 NOTE — Telephone Encounter (Signed)
Or OV, if he says same thing etc then maybe ok. He has had some health issues

## 2020-12-08 NOTE — Telephone Encounter (Signed)
Patient called he is requesting a appointment with Dr.Newton call [phone]

## 2020-12-08 NOTE — Telephone Encounter (Signed)
Left C7-T1 IL on 05/07/2020. Ok to repeat if helped, same problem/side, and no new injury?

## 2020-12-23 ENCOUNTER — Encounter: Payer: Self-pay | Admitting: Physical Medicine and Rehabilitation

## 2020-12-23 ENCOUNTER — Other Ambulatory Visit: Payer: Self-pay

## 2020-12-23 ENCOUNTER — Ambulatory Visit: Payer: Managed Care, Other (non HMO) | Admitting: Physical Medicine and Rehabilitation

## 2020-12-23 VITALS — BP 157/77 | HR 75

## 2020-12-23 DIAGNOSIS — M47812 Spondylosis without myelopathy or radiculopathy, cervical region: Secondary | ICD-10-CM

## 2020-12-23 DIAGNOSIS — M542 Cervicalgia: Secondary | ICD-10-CM | POA: Diagnosis not present

## 2020-12-23 DIAGNOSIS — R202 Paresthesia of skin: Secondary | ICD-10-CM

## 2020-12-23 DIAGNOSIS — G549 Nerve root and plexus disorder, unspecified: Secondary | ICD-10-CM

## 2020-12-23 DIAGNOSIS — M5010 Cervical disc disorder with radiculopathy, unspecified cervical region: Secondary | ICD-10-CM

## 2020-12-23 NOTE — Progress Notes (Signed)
Pt state neck pain that travels to his left shoulder. Pt state turning his head he feels pain.Pt state left hand goes numb at times. Pt state climbing things for work makes the pain worse. Pt state he take pain meds to help ease the pain.  Numeric Pain Rating Scale and Functional Assessment Average Pain 8 Pain Right Now 3 My pain is constant, sharp, burning, stabbing and aching Pain is worse with: bending and some activites Pain improves with: rest and injections   In the last MONTH (on 0-10 scale) has pain interfered with the following?  1. General activity like being  able to carry out your everyday physical activities such as walking, climbing stairs, carrying groceries, or moving a chair?  Rating(8)  2. Relation with others like being able to carry out your usual social activities and roles such as  activities at home, at work and in your community. Rating(7)  3. Enjoyment of life such that you have  been bothered by emotional problems such as feeling anxious, depressed or irritable?  Rating(8)

## 2020-12-30 ENCOUNTER — Telehealth: Payer: Self-pay | Admitting: Physical Medicine and Rehabilitation

## 2020-12-30 NOTE — Telephone Encounter (Signed)
Patient's wife called to cancel and reschedule patient appt. Please call pt wife Rinaldo Cloud at [phone]

## 2020-12-30 NOTE — Telephone Encounter (Signed)
Called pt and r/s 

## 2020-12-31 ENCOUNTER — Ambulatory Visit: Payer: Managed Care, Other (non HMO) | Admitting: Physical Medicine and Rehabilitation

## 2020-12-31 ENCOUNTER — Encounter: Payer: Self-pay | Admitting: Physical Medicine and Rehabilitation

## 2020-12-31 NOTE — Progress Notes (Signed)
Jason [name] - 55 y.o. male MRN [id number]  Date of birth: 1965-09-28  Office Visit Note: Visit Date: 12/23/2020 PCP: [name], MD Referred by: [name], MD  Subjective: Chief Complaint  Patient presents with  . Neck - Pain  . Left Shoulder - Pain  . Left Hand - Pain, Numbness   HPI: Jason [name] is a 55 y.o. male who comes in today For evaluation and management of chronic worsening severe at times neck pain and left shoulder more than right pain with occasional symptoms into the hand.  Patient is well-known to me and we have seen him over the years for epidural injections which have been fairly beneficial in the past.  Unfortunately in September he had epidural injection that he did not feel helped as much.  After that time Dr. Caryl Never his primary care physician did refer him to Dr. Venita Lick at [facility] for evaluation from a spine surgeon standpoint.  We do not have all the notes to review I can see where he did see him and has completed some physical therapy and he has had new imaging.  I do have a copy of the new cervical spine MRI this is reviewed with the patient and reviewed below.  MRI basically shows multilevel foraminal stenosis with left more than right arthritic changes in the upper and middle cervical spine around C3-4 and C4-5.  There is bilateral foraminal narrowing throughout left more than right.  He reports that Dr. Shon Baton told him it would be a multilevel surgery and cervical fusion.  Patient does not really want to undergo surgery if he can help it.  His case is complicated by poorly controlled type 2 diabetes with history of peripheral artery disease and peripheral neuropathy.  We had actually performed electrodiagnostic study of his left upper limb in our office that did not show any compressive mononeuropathy at the time and it was felt like probably his symptoms were related to either myofascial pain syndrome or possibly cervical  radiculitis which will not show up on this test.  His biggest complaint today is turning his head to the left and having left neck pain with some pain into the shoulder blade.  He rates his pain as an 8 out of 10 at times right now today it is a 3 out of 10.  He gets worsening with bending and rotation of the neck he has constant sharp burning pain and stabbing pain.  He has been taking Celebrex, gabapentin and duloxetine without much relief of his neck pain.  He does report this seems to be more mechanical with turning and it does affect his ability to work.  He does do a lot of work overhead with looking up.  He reports Dr. Shon Baton told him if surgery was performed he would not be able to do that.  He denies any focal weakness sometimes associated headache.  Again left-sided paresthesia in the hand at times but this is infrequent.  He does continue with home exercise program from physical therapy.  Review of Systems  Musculoskeletal: Positive for joint pain and neck pain.  Neurological: Positive for tingling.  All other systems reviewed and are negative.  Otherwise per HPI.  Assessment & Plan: Visit Diagnoses:    ICD-10-CM   1. Cervicalgia  M54.2   2. Cervical spondylosis without myelopathy  M47.812   3. Cervical disc disorder with radiculopathy  M50.10   4. Paresthesia of skin  R20.2   5.  Sensory neuronopathy  G54.9      Plan: Findings:  Chronic history of recalcitrant neck and left shoulder blade pain with sometimes radicular type pain into the hands but his biggest complaint is left-sided neck pain worse with rotation.  I think the reason the epidural injection did not help in September was it seems like his problem really is related more to the arthritis at this point.  He does get tingling in the hand at times but his biggest complaint is the neck pain worse with rotation and I do think cervical facet joint blocks would go a long way into diagnosing the problem.  I would suggest diagnostic  cervical medial branch blocks at C3-4 C4-5 and C5-6.  This would be on the left side probably down from a side-lying position.  I discussed this at length with him he does want proceed with that.  If he does get relief could look at a double block paradigm and radiofrequency ablation.  If he does not get relief could entertain the idea of repeat epidural injection versus trigger point injection or regrouping with a physical therapist for dry needling.  All injections performed in a comprehensive pain approach with access to orthopedic surgery as well as [facility] for coping strategies etc. if needed.  He will continue on current medications.    Meds & Orders: No orders of the defined types were placed in this encounter.  No orders of the defined types were placed in this encounter.   Follow-up: Return for Left C3-4, C4-5 and C5-6 facet joint blocks..   Procedures: No procedures performed      Clinical History: Cervical MRI dated 11/23/2020 ordered by Venita Lickahari Brooks, MD  Impression: 1.  Severe left and mild to moderate right C3-4 foraminal narrowing  2.  Severe left and moderate severe right C4-5 foraminal narrowing  3. Severe left and moderate right C5-6 foraminal narrowing  4.  Severe bilateral C6-7 foraminal narrowing.  5.  Moderate severe right C2 foraminal narrowing.  6.  Mild left T1-2 foraminal narrowing.  7.  Mild dextroconvex cervical scoliosis apex centered at C6   8.  Severe left C3-4 facet arthrosis. ------   09/15/2017 Electrodiagnostic Study - Naaman PlummerFred Adell Koval, MD Impression: Essentially NORMAL electrodiagnostic study of the left upper limb. There is no significant electrodiagnostic evidence of nerve entrapment, brachial plexopathy or cervical radiculopathy. **The very borderline reduced amplitudes are likely artifact.  As you know, purely sensory or demyelinating radiculopathies and chemical radiculitis may not be detected with this particular  electrodiagnostic study.  Recommendations: 1. Follow-up with referring physician. 2. Continue current management of symptoms. Repeat cervical epidural x1. Reevaluate intrinsic left shoulder pathology.   He reports that he has been smoking cigarettes. He has a 50.00 pack-year smoking history. He has never used smokeless tobacco.  Recent Labs    03/10/20 1024  HGBA1C 7.8*    Objective:  VS:  HT:    WT:   BMI:     BP:(!) 157/77  HR:75bpm  TEMP: ( )  RESP:  Physical Exam Vitals and nursing note reviewed.  Constitutional:      General: He is not in acute distress.    Appearance: Normal appearance. He is not ill-appearing.  HENT:     Head: Normocephalic and atraumatic.     Right Ear: External ear normal.     Left Ear: External ear normal.  Eyes:     Extraocular Movements: Extraocular movements intact.  Cardiovascular:  Rate and Rhythm: Normal rate.     Pulses: Normal pulses.  Abdominal:     General: There is no distension.     Palpations: Abdomen is soft.  Musculoskeletal:        General: No signs of injury.     Cervical back: Neck supple. Tenderness present. No rigidity.     Right lower leg: No edema.     Left lower leg: No edema.     Comments: Patient has good strength in the upper extremities with 5 out of 5 strength in wrist extension long finger flexion APB.  No intrinsic hand muscle atrophy.  Negative Hoffmann's test.  He has concordant pain with left rotation more than right rotation consistent with facet mediated pain and facet loading.  He has a negative Spurling's test bilaterally.  He does have focal trigger points that do reproduce some of his pain particular levator scapula trapezius and sternocleidomastoid.  He has some shoulder impingement signs bilaterally.  These do not reproduce all of his symptoms.  Lymphadenopathy:     Cervical: No cervical adenopathy.  Skin:    Findings: No erythema or rash.  Neurological:     General: No focal deficit present.      Mental Status: He is alert and oriented to person, place, and time.     Sensory: No sensory deficit.     Motor: No weakness or abnormal muscle tone.     Coordination: Coordination normal.  Psychiatric:        Mood and Affect: Mood normal.        Behavior: Behavior normal.     Ortho Exam  Imaging: No results found.  Past Medical/Family/Surgical/Social History: Medications & Allergies reviewed per EMR, new medications updated. Patient Active Problem List   Diagnosis Date Noted  . 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 Primary osteoarthritis of right hip 07/12/2019  . Avascular necrosis (HCC) 06/15/2019  . PAD (peripheral artery disease) (HCC) 12/11/2017  . Preoperative cardiovascular examination 12/05/2017  . Peripheral arterial disease (HCC) 10/23/2017  . History of MRSA infection 10/23/2017  . Intermittent claudication (HCC) 10/17/2017  . Herniated cervical intervertebral disc 08/01/2017  . Osteoarthritis of spine 08/01/2017  . Poorly controlled type 2 diabetes mellitus with circulatory disorder (HCC) 07/11/2017  . Polyarthralgia 06/27/2017  . Cervical pain (neck) 06/27/2017  . Primary osteoarthritis of both knees 05/02/2017  . Sensory neuronopathy 01/14/2017  . Transaminasemia 10/10/2016  . Vitamin B 12 deficiency 09/19/2016  . Hyperglycemia 09/19/2016  . Chest pressure 07/04/2012  . Cough 07/04/2012  . Hyperlipidemia 10/12/2010  . ANXIETY 10/12/2010  . History of alcohol abuse 10/12/2010  . TOBACCO USE 10/12/2010  . DEPRESSION 10/12/2010  . Essential hypertension, benign 10/12/2010  . GERD 10/12/2010  . Osteoarthritis 10/12/2010  . Sleep apnea 10/12/2010   Past Medical History:  Diagnosis Date  . Anxiety   . Depression   . Diabetes mellitus without complication (HCC)   . GERD (gastroesophageal reflux disease)   . Headache(784.0)   . Hyperlipidemia   . Hypertension    . MRSA infection    Left shin, back  . Nerve damage    BLE  . Osteoarthritis   . Peripheral vascular disease (HCC)   . Pneumonia    Family History  Problem Relation Age of Onset  . Heart attack Father 60  . Hypertension Father   . Arthritis Mother   . Asthma Maternal Grandfather   .  Stroke Paternal Grandfather   . Heart disease Other   . Mental illness Other   . Cancer Neg Hx   . Diabetes Neg Hx   . Drug abuse Neg Hx   . Early death Neg Hx   . Hearing loss Neg Hx   . Hyperlipidemia Neg Hx    Past Surgical History:  Procedure Laterality Date  . ABDOMINAL AORTOGRAM W/LOWER EXTREMITY N/A 12/01/2017   Procedure: ABDOMINAL AORTOGRAM W/LOWER EXTREMITY;  Surgeon: Sherren Kerns, MD;  Location: [facility] INVASIVE CV LAB;  Service: Cardiovascular;  Laterality: N/A;  . FEMORAL-POPLITEAL BYPASS GRAFT Right 12/11/2017   Procedure: BYPASS GRAFT RIGHT FEMORAL-ABOVE KNEE POPLITEAL ARTERY with Non Reversed Greater Saphenous Vein and Angioplasty distal vein Graft.;  Surgeon: Sherren Kerns, MD;  Location: [facility] OR;  Service: Vascular;  Laterality: Right;  . PERIPHERAL VASCULAR BALLOON ANGIOPLASTY Right 12/01/2017   Procedure: PERIPHERAL VASCULAR BALLOON ANGIOPLASTY;  Surgeon: Sherren Kerns, MD;  Location: [facility] INVASIVE CV LAB;  Service: Cardiovascular;  Laterality: Right;  superficial femoral, attempted  . PERIPHERAL VASCULAR INTERVENTION Left 12/01/2017   Procedure: PERIPHERAL VASCULAR INTERVENTION;  Surgeon: Sherren Kerns, MD;  Location: [facility] INVASIVE CV LAB;  Service: Cardiovascular;  Laterality: Left;  External iliac  . ROTATOR CUFF REPAIR    . TONSILLECTOMY    . VEIN HARVEST Right 12/11/2017   Procedure: RIGHT GREATER SAPHENOUS VEIN HARVEST;  Surgeon: Sherren Kerns, MD;  Location: [facility] OR;  Service: Vascular;  Laterality: Right;   Social History   Occupational History  . Not on file  Tobacco Use  . Smoking status: Current Every Day Smoker    Packs/day: 2.00    Years: 25.00    Pack  years: 50.00    Types: Cigarettes  . Smokeless tobacco: Never Used  . Tobacco comment: 30 cigarettes per day  Vaping Use  . Vaping Use: Never used  Substance and Sexual Activity  . Alcohol use: Not Currently    Alcohol/week: 20.0 standard drinks    Types: 20 Glasses of wine per week    Comment: not since Jan 2018  . Drug use: No  . Sexual activity: Not Currently

## 2021-01-07 ENCOUNTER — Encounter: Payer: Self-pay | Admitting: Physical Medicine and Rehabilitation

## 2021-01-07 ENCOUNTER — Ambulatory Visit: Payer: Self-pay

## 2021-01-07 ENCOUNTER — Other Ambulatory Visit: Payer: Self-pay

## 2021-01-07 ENCOUNTER — Ambulatory Visit (INDEPENDENT_AMBULATORY_CARE_PROVIDER_SITE_OTHER): Payer: Managed Care, Other (non HMO) | Admitting: Physical Medicine and Rehabilitation

## 2021-01-07 VITALS — BP 161/66 | HR 76

## 2021-01-07 DIAGNOSIS — M47812 Spondylosis without myelopathy or radiculopathy, cervical region: Secondary | ICD-10-CM | POA: Diagnosis not present

## 2021-01-07 MED ORDER — BETAMETHASONE SOD PHOS & ACET 6 (3-3) MG/ML IJ SUSP
12.0000 mg | Freq: Once | INTRAMUSCULAR | Status: AC
Start: 2021-01-07 — End: 2021-01-07
  Administered 2021-01-07: 12 mg

## 2021-01-07 NOTE — Patient Instructions (Signed)

## 2021-01-07 NOTE — Progress Notes (Signed)
Pt state neck pain. Pt state any movement of the neck makes the pain worse. Pt state he take pain meds to help ease the pain.  Numeric Pain Rating Scale and Functional Assessment Average Pain 4   In the last MONTH (on 0-10 scale) has pain interfered with the following?  1. General activity like being  able to carry out your everyday physical activities such as walking, climbing stairs, carrying groceries, or moving a chair?  Rating(8)   +Driver, -BT, -Dye Allergies.

## 2021-01-08 NOTE — Progress Notes (Signed)
Jason [name] - 55 y.o. male MRN [id number]  Date of birth: 1965/12/14  Office Visit Note: Visit Date: 01/07/2021 PCP: [name], MD Referred by: [name], MD  Subjective: Chief Complaint  Patient presents with  . Neck - Pain   HPI:  Jason [name] is a 55 y.o. male who comes in today  for planned Left  C3-4, C4-5 and C5-6 Cervical facet/medial branch block with fluoroscopic guidance.  The patient has failed conservative care including home exercise, medications, time and activity modification.  This injection will be diagnostic and hopefully therapeutic.  Please see requesting physician notes for further details and justification.  Exam has shown concordant pain with facet joint loading.  ROS Otherwise per HPI.  Assessment & Plan: Visit Diagnoses:    ICD-10-CM   1. Cervical spondylosis without myelopathy  M47.812 XR C-ARM NO REPORT    Facet Injection    betamethasone acetate-betamethasone sodium phosphate (CELESTONE) injection 12 mg    Plan: No additional findings.   Meds & Orders:  Meds ordered this encounter  Medications  . betamethasone acetate-betamethasone sodium phosphate (CELESTONE) injection 12 mg    Orders Placed This Encounter  Procedures  . Facet Injection  . XR C-ARM NO REPORT    Follow-up: Return if symptoms worsen or fail to improve.   Procedures: No procedures performed  Diagnostic Cervical Facet Joint Nerve Block with Fluoroscopic Guidance  Patient: Jason [name]      Date of Birth: [birth year] MRN: [id number] PCP: [name], MD      Visit Date: 01/07/2021   Universal Protocol:    Date/Time: 05/13/228:47 AM  Consent Given By: the patient  Position: LATERAL  Additional Comments: Vital signs were monitored before and after the procedure. Patient was prepped and draped in the usual sterile fashion. The correct patient, procedure, and site was verified.   Injection Procedure Details:   Procedure diagnoses:  Cervical spondylosis without myelopathy [M47.812]   Meds Administered:  Meds ordered this encounter  Medications  . betamethasone acetate-betamethasone sodium phosphate (CELESTONE) injection 12 mg     Laterality: Left  Location/Site:  C3-4 C4-5 C5-6  Needle size: 25 G  Needle type: spinal needle  Needle Placement: Articular Pillar  Findings:  -Contrast Used: 0.5 mL iohexol 180 mg iodine/mL   -Comments: Excellent flow of contrast across the articular pillars without intravascular flow.  If repeat injection needs to be done I would do this in a prone position with him due to body habitus.  Procedure Details: The fluoroscope beam was manipulated to achieve the best "true" lateral view possible by squaring off the endplates with cranial and caudal tilt and using varying obliquity to achieve the a view with the longest length of spinous process.  The region overlying the facet joints mentioned above were then localized under fluoroscopic visualization.  The needle was inserted down to the center of the "trapezoid" outline of the facet joint lateral mass. Bi-planar images were used for confirming placement and spot radiographs were documented.  A 0.25 ml volume of Omnipaque-240 was injected to look for vascular uptake. A 0.5 ml. volume of the anesthetic solution was injected onto the target. This procedure was repeated for each medial branch nerve injected.  Prior to the procedure, the patient was given a Pain Diary which was completed for baseline measurements.  After the procedure, the patient rated their pain every 30 minutes and will continue rating at this frequency for a total of 5 hours.  The patient has been asked to complete the Diary and return to [place] by mail, fax or hand delivered as soon as possible.   Additional Comments:  The patient tolerated the procedure well Dressing: Band-Aid    Post-procedure details: Patient was observed during the procedure. Post-procedure  instructions were reviewed.  Patient left the clinic in stable condition.      Clinical History: Cervical MRI dated 11/23/2020 ordered by Venita Lick, MD  Impression: 1.  Severe left and mild to moderate right C3-4 foraminal narrowing  2.  Severe left and moderate severe right C4-5 foraminal narrowing  3. Severe left and moderate right C5-6 foraminal narrowing  4.  Severe bilateral C6-7 foraminal narrowing.  5.  Moderate severe right C2 foraminal narrowing.  6.  Mild left T1-2 foraminal narrowing.  7.  Mild dextroconvex cervical scoliosis apex centered at C6   8.  Severe left C3-4 facet arthrosis. ------   09/15/2017 Electrodiagnostic Study - Naaman Plummer, MD Impression: Essentially NORMAL electrodiagnostic study of the left upper limb. There is no significant electrodiagnostic evidence of nerve entrapment, brachial plexopathy or cervical radiculopathy. **The very borderline reduced amplitudes are likely artifact.  As you know, purely sensory or demyelinating radiculopathies and chemical radiculitis may not be detected with this particular electrodiagnostic study.  Recommendations: 1. Follow-up with referring physician. 2. Continue current management of symptoms. Repeat cervical epidural x1. Reevaluate intrinsic left shoulder pathology.     Objective:  VS:  HT:    WT:   BMI:     BP:(!) 161/66  HR:76bpm  TEMP: ( )  RESP:  Physical Exam Vitals and nursing note reviewed.  Constitutional:      General: He is not in acute distress.    Appearance: Normal appearance. He is not ill-appearing.  HENT:     Head: Normocephalic and atraumatic.     Right Ear: External ear normal.     Left Ear: External ear normal.  Eyes:     Extraocular Movements: Extraocular movements intact.  Cardiovascular:     Rate and Rhythm: Normal rate.     Pulses: Normal pulses.  Abdominal:     General: There is no distension.     Palpations: Abdomen is soft.  Musculoskeletal:         General: No signs of injury.     Cervical back: Neck supple. Tenderness present. No rigidity.     Right lower leg: No edema.     Left lower leg: No edema.     Comments: Concordant neck pain with rotation to the left with decreased range of motion to the left.  Pain with facet loading and extension.  Negative Spurling's.  Patient has good strength in the upper extremities with 5 out of 5 strength in wrist extension long finger flexion APB.  No intrinsic hand muscle atrophy.  Negative Hoffmann's test.  Lymphadenopathy:     Cervical: No cervical adenopathy.  Skin:    Findings: No erythema or rash.  Neurological:     General: No focal deficit present.     Mental Status: He is alert and oriented to person, place, and time.     Sensory: No sensory deficit.     Motor: No weakness or abnormal muscle tone.     Coordination: Coordination normal.  Psychiatric:        Mood and Affect: Mood normal.        Behavior: Behavior normal.      Imaging: XR C-ARM NO REPORT  Result Date: 01/07/2021 Please  see Notes tab for imaging impression.

## 2021-01-08 NOTE — Procedures (Signed)
Diagnostic Cervical Facet Joint Nerve Block with Fluoroscopic Guidance  Patient: Jason [name]      Date of Birth: [birth year] MRN: [id number] PCP: [name], MD      Visit Date: 01/07/2021   Universal Protocol:    Date/Time: 05/13/228:47 AM  Consent Given By: the patient  Position: LATERAL  Additional Comments: Vital signs were monitored before and after the procedure. Patient was prepped and draped in the usual sterile fashion. The correct patient, procedure, and site was verified.   Injection Procedure Details:   Procedure diagnoses: Cervical spondylosis without myelopathy [M47.812]   Meds Administered:  Meds ordered this encounter  Medications  . betamethasone acetate-betamethasone sodium phosphate (CELESTONE) injection 12 mg     Laterality: Left  Location/Site:  C3-4 C4-5 C5-6  Needle size: 25 G  Needle type: spinal needle  Needle Placement: Articular Pillar  Findings:  -Contrast Used: 0.5 mL iohexol 180 mg iodine/mL   -Comments: Excellent flow of contrast across the articular pillars without intravascular flow.  If repeat injection needs to be done I would do this in a prone position with him due to body habitus.  Procedure Details: The fluoroscope beam was manipulated to achieve the best "true" lateral view possible by squaring off the endplates with cranial and caudal tilt and using varying obliquity to achieve the a view with the longest length of spinous process.  The region overlying the facet joints mentioned above were then localized under fluoroscopic visualization.  The needle was inserted down to the center of the "trapezoid" outline of the facet joint lateral mass. Bi-planar images were used for confirming placement and spot radiographs were documented.  A 0.25 ml volume of Omnipaque-240 was injected to look for vascular uptake. A 0.5 ml. volume of the anesthetic solution was injected onto the target. This procedure was repeated for each  medial branch nerve injected.  Prior to the procedure, the patient was given a Pain Diary which was completed for baseline measurements.  After the procedure, the patient rated their pain every 30 minutes and will continue rating at this frequency for a total of 5 hours.  The patient has been asked to complete the Diary and return to [place] by mail, fax or hand delivered as soon as possible.   Additional Comments:  The patient tolerated the procedure well Dressing: Band-Aid    Post-procedure details: Patient was observed during the procedure. Post-procedure instructions were reviewed.  Patient left the clinic in stable condition.

## 2021-01-10 ENCOUNTER — Other Ambulatory Visit: Payer: Self-pay | Admitting: Family Medicine

## 2021-01-22 ENCOUNTER — Other Ambulatory Visit: Payer: Self-pay | Admitting: Family Medicine

## 2021-01-23 ENCOUNTER — Other Ambulatory Visit: Payer: Self-pay | Admitting: Family Medicine

## 2021-02-14 ENCOUNTER — Other Ambulatory Visit: Payer: Self-pay | Admitting: Family Medicine

## 2021-02-24 ENCOUNTER — Other Ambulatory Visit: Payer: Self-pay | Admitting: Family Medicine

## 2021-02-24 NOTE — Telephone Encounter (Signed)
Flexeril is not on the current medication list. Please advise.

## 2021-02-26 NOTE — Telephone Encounter (Signed)
Flexeril discontinued by another provider, ok to refill?

## 2021-03-03 NOTE — Telephone Encounter (Signed)
Flexeril is not on the patients med list.  Metformin is not due yet. All others are due for refills.  Please advise on flexeril.

## 2021-05-02 ENCOUNTER — Other Ambulatory Visit: Payer: Self-pay | Admitting: Family Medicine

## 2021-05-21 ENCOUNTER — Other Ambulatory Visit: Payer: Self-pay | Admitting: Family Medicine

## 2021-05-22 ENCOUNTER — Other Ambulatory Visit: Payer: Self-pay | Admitting: Family Medicine

## 2021-05-22 DIAGNOSIS — E7801 Familial hypercholesterolemia: Secondary | ICD-10-CM

## 2021-05-22 DIAGNOSIS — E785 Hyperlipidemia, unspecified: Secondary | ICD-10-CM

## 2021-06-14 ENCOUNTER — Ambulatory Visit: Payer: Managed Care, Other (non HMO) | Admitting: Family Medicine

## 2021-06-14 ENCOUNTER — Other Ambulatory Visit: Payer: Self-pay

## 2021-06-14 ENCOUNTER — Encounter: Payer: Self-pay | Admitting: Family Medicine

## 2021-06-14 VITALS — BP 110/58 | HR 90 | Temp 98.2°F | Ht 70.0 in | Wt 216.8 lb

## 2021-06-14 DIAGNOSIS — I739 Peripheral vascular disease, unspecified: Secondary | ICD-10-CM

## 2021-06-14 DIAGNOSIS — E7801 Familial hypercholesterolemia: Secondary | ICD-10-CM | POA: Diagnosis not present

## 2021-06-14 DIAGNOSIS — E1159 Type 2 diabetes mellitus with other circulatory complications: Secondary | ICD-10-CM

## 2021-06-14 DIAGNOSIS — E1165 Type 2 diabetes mellitus with hyperglycemia: Secondary | ICD-10-CM | POA: Diagnosis not present

## 2021-06-14 NOTE — Progress Notes (Signed)
Established Patient Office Visit  Subjective:  Patient ID: Jason [name], male    DOB: 12-14-1965  Age: 55 y.o. MRN: [id number]  CC:  Chief Complaint  Patient presents with   Leg Pain    Patient complains of cramping sensation noted in the left calf for months    HPI Jason [name] presents for some progressive left lower extremity pains with ambulation over the past few months.  He works extremely hectic schedule.  He states that he is consistently having some cramp-like pains in his left calf and foot when he walks more than about 40 yards.  This is exactly the same kind of symptoms he had with claudication few years ago.  He had bypass right lower extremity and a stent placed in the left.  He has not seen vascular surgery in some time.  He unfortunately still smoking about 40 cigarettes/day.  He has type 2 diabetes but no recent labs.  He has hyperlipidemia treated with Zetia and Lipitor.  Needs follow-up labs.  Denies any recent chest pains.  Does not monitor sugars regularly.  Remote history of alcohol abuse.  None in several years.  Other medical problems include history of hyperlipidemia, past history of depression, history of GERD  Past Medical History:  Diagnosis Date   Anxiety    Depression    Diabetes mellitus without complication (HCC)    GERD (gastroesophageal reflux disease)    Headache(784.0)    Hyperlipidemia    Hypertension    MRSA infection    Left shin, back   Nerve damage    BLE   Osteoarthritis    Peripheral vascular disease (HCC)    Pneumonia     Past Surgical History:  Procedure Laterality Date   ABDOMINAL AORTOGRAM W/LOWER EXTREMITY N/A 12/01/2017   Procedure: ABDOMINAL AORTOGRAM W/LOWER EXTREMITY;  Surgeon: Sherren Kerns, MD;  [facility];  Service: Cardiovascular;  Laterality: N/A;   FEMORAL-POPLITEAL BYPASS GRAFT Right 12/11/2017   Procedure: BYPASS GRAFT RIGHT FEMORAL-ABOVE KNEE POPLITEAL ARTERY with Non Reversed Greater  Saphenous Vein and Angioplasty distal vein Graft.;  Surgeon: Sherren Kerns, MD;  Location: [facility] OR;  Service: Vascular;  Laterality: Right;   PERIPHERAL VASCULAR BALLOON ANGIOPLASTY Right 12/01/2017   Procedure: PERIPHERAL VASCULAR BALLOON ANGIOPLASTY;  Surgeon: Sherren Kerns, MD;  Location: [facility];  Service: Cardiovascular;  Laterality: Right;  superficial femoral, attempted   PERIPHERAL VASCULAR INTERVENTION Left 12/01/2017   Procedure: PERIPHERAL VASCULAR INTERVENTION;  Surgeon: Sherren Kerns, MD;  Location: [facility];  Service: Cardiovascular;  Laterality: Left;  External iliac   ROTATOR CUFF REPAIR     TONSILLECTOMY     VEIN HARVEST Right 12/11/2017   Procedure: RIGHT GREATER SAPHENOUS VEIN HARVEST;  Surgeon: Sherren Kerns, MD;  Location: [facility] OR;  Service: Vascular;  Laterality: Right;    Family History  Problem Relation Age of Onset   Heart attack Father 57   Hypertension Father    Arthritis Mother    Asthma Maternal Grandfather    Stroke Paternal Grandfather    Heart disease Other    Mental illness Other    Cancer Neg Hx    Diabetes Neg Hx    Drug abuse Neg Hx    Early death Neg Hx    Hearing loss Neg Hx    Hyperlipidemia Neg Hx     Social History   Socioeconomic History   Marital status: Married    Spouse name:  Not on file   Number of children: Not on file   Years of education: Not on file   Highest education level: Not on file  Occupational History   Not on file  Tobacco Use   Smoking status: Every Day    Packs/day: 2.00    Years: 25.00    Pack years: 50.00    Types: Cigarettes   Smokeless tobacco: Never   Tobacco comments:    30 cigarettes per day  Vaping Use   Vaping Use: Never used  Substance and Sexual Activity   Alcohol use: Not Currently    Alcohol/week: 20.0 standard drinks    Types: 20 Glasses of wine per week    Comment: not since Jan 2018   Drug use: No   Sexual activity: Not Currently  Other Topics Concern   Not  on file  Social History Narrative   Not on file   Social Determinants of Health   Financial Resource Strain: Not on file  Food Insecurity: Not on file  Transportation Needs: Not on file  Physical Activity: Not on file  Stress: Not on file  Social Connections: Not on file  Intimate Partner Violence: Not on file    Outpatient Medications Prior to Visit  Medication Sig Dispense Refill   amitriptyline (ELAVIL) 25 MG tablet TAKE 1 TABLET BY MOUTH EVERYDAY AT BEDTIME 90 tablet 1   atorvastatin (LIPITOR) 40 MG tablet TAKE 1 TABLET BY MOUTH EVERY DAY 90 tablet 1   Blood Glucose Monitoring Suppl (CONTOUR BLOOD GLUCOSE SYSTEM) DEVI Use once daily for diabetes Dx E11.9 1 Device 0   celecoxib (CELEBREX) 200 MG capsule TAKE 1 CAPSULE BY MOUTH EVERY DAY 30 capsule 1   CONTOUR NEXT TEST test strip TEST GLUCOSE ONCE DAILY. DX E 11.9 100 strip 3   DULoxetine (CYMBALTA) 60 MG capsule TAKE 1 CAPSULE BY MOUTH EVERY DAY 90 capsule 0   ezetimibe (ZETIA) 10 MG tablet TAKE 1 TABLET BY MOUTH EVERY DAY 90 tablet 1   gabapentin (NEURONTIN) 300 MG capsule Take 2 capsules by mouth twice daily 360 capsule 3   JANUVIA 100 MG tablet TAKE 1 TABLET BY MOUTH EVERY DAY 90 tablet 3   JARDIANCE 10 MG TABS tablet TAKE 1 TABLET BY MOUTH DAILY 90 tablet 3   Lancets MISC Contour Lancets.  Test glucose once daily. DxE11.9 100 each 3   metFORMIN (GLUCOPHAGE) 500 MG tablet TAKE 2 TABLETS BY MOUTH TWICE A DAY 360 tablet 0   cyclobenzaprine (FLEXERIL) 10 MG tablet TAKE 1 TABLET BY MOUTH EVERYDAY AT BEDTIME 30 tablet 0   LORazepam (ATIVAN) 1 MG tablet Take 1 tablet by mouth 1 hour prior to procedure. 1 tablet 0   metoprolol succinate (TOPROL-XL) 25 MG 24 hr tablet Take 1 tablet (25 mg total) by mouth daily. 90 tablet 2   traZODone (DESYREL) 50 MG tablet TAKE 1 TABLET BY MOUTH AT BEDTIME AS NEEDED FOR SLEEP 90 tablet 2   No facility-administered medications prior to visit.    Allergies  Allergen Reactions   Olmesartan  Medoxomil Other (See Comments)    REACTION: dizziness    ROS Review of Systems  Constitutional:  Negative for fatigue.  Eyes:  Negative for visual disturbance.  Respiratory:  Negative for cough, chest tightness and shortness of breath.   Cardiovascular:  Negative for chest pain, palpitations and leg swelling.  Gastrointestinal:  Negative for abdominal pain.  Genitourinary:  Negative for dysuria.  Neurological:  Negative for dizziness, syncope, weakness, light-headedness  and headaches.     Objective:    Physical Exam Constitutional:      Appearance: He is well-developed.  HENT:     Right Ear: External ear normal.     Left Ear: External ear normal.  Eyes:     Pupils: Pupils are equal, round, and reactive to light.  Neck:     Thyroid: No thyromegaly.  Cardiovascular:     Rate and Rhythm: Normal rate and regular rhythm.     Comments: Both feet are fairly warm to touch.  Capillary refill is just slightly delayed left foot compared to right.  He has good dorsalis pedis and posterior tibial pulses on the right foot and difficult to palpate on the left. Pulmonary:     Effort: Pulmonary effort is normal. No respiratory distress.     Breath sounds: Normal breath sounds. No wheezing or rales.  Musculoskeletal:     Cervical back: Neck supple.     Right lower leg: No edema.     Left lower leg: No edema.  Neurological:     Mental Status: He is alert and oriented to person, place, and time.    BP (!) 110/58 (BP Location: Left Arm, Patient Position: Sitting, Cuff Size: Large)   Pulse 90   Temp 98.2 F (36.8 C) (Oral)   Ht 5\' 10"  (1.778 m)   Wt 216 lb 12.8 oz (98.3 kg)   SpO2 95%   BMI 31.11 kg/m  Wt Readings from Last 3 Encounters:  06/14/21 216 lb 12.8 oz (98.3 kg)  08/10/20 221 lb 4 oz (100.4 kg)  06/17/20 212 lb (96.2 kg)     Health Maintenance Due  Topic Date Due   COVID-19 Vaccine (1) Never done   FOOT EXAM  Never done   OPHTHALMOLOGY EXAM  Never done   URINE  MICROALBUMIN  Never done   COLONOSCOPY (Pts 45-52yrs Insurance coverage will need to be confirmed)  Never done   TETANUS/TDAP  08/29/2012   Zoster Vaccines- Shingrix (1 of 2) Never done   HEMOGLOBIN A1C  09/10/2020    There are no preventive care reminders to display for this patient.  Lab Results  Component Value Date   TSH 0.58 03/10/2020   Lab Results  Component Value Date   WBC 13.7 (H) 03/10/2020   HGB 16.8 03/10/2020   HCT 50.2 (H) 03/10/2020   MCV 86.1 03/10/2020   PLT 282 03/10/2020   Lab Results  Component Value Date   NA 136 03/10/2020   K 4.0 03/10/2020   CO2 23 03/10/2020   GLUCOSE 253 (H) 03/10/2020   BUN 9 03/10/2020   CREATININE 0.82 03/10/2020   BILITOT 0.5 03/10/2020   ALKPHOS 95 12/28/2017   AST 14 03/10/2020   ALT 18 03/10/2020   PROT 6.8 03/10/2020   ALBUMIN 4.5 12/28/2017   CALCIUM 9.4 03/10/2020   ANIONGAP 10 12/12/2017   GFR 106.56 12/28/2017   Lab Results  Component Value Date   CHOL 157 03/10/2020   Lab Results  Component Value Date   HDL 33 (L) 03/10/2020   Lab Results  Component Value Date   LDLCALC 99 03/10/2020   Lab Results  Component Value Date   TRIG 157 (H) 03/10/2020   Lab Results  Component Value Date   CHOLHDL 4.8 03/10/2020   Lab Results  Component Value Date   HGBA1C 7.8 (H) 03/10/2020      Assessment & Plan:   #1 peripheral vascular disease.  Past history of femoral-popliteal  bypass graft on the right.  He had peripheral vascular intervention with stent on the left.  Recent increase in left lower extremity symptoms.  He describes calf pain which is aching in quality after about 40 yards of walking and improved with rest. -Set up to see vascular surgery -Strongly encouraged to stop smoking  #2 dyslipidemia.  Goal LDL less than 70 -Future lab order with lipid and hepatic panel  #3 type 2 diabetes.  No recent labs. -Future lab order for A1c   No orders of the defined types were placed in this  encounter.   Follow-up: No follow-ups on file.    Evelena Peat, MD

## 2021-06-15 ENCOUNTER — Other Ambulatory Visit: Payer: Self-pay | Admitting: Family Medicine

## 2021-06-16 ENCOUNTER — Other Ambulatory Visit (INDEPENDENT_AMBULATORY_CARE_PROVIDER_SITE_OTHER): Payer: Managed Care, Other (non HMO)

## 2021-06-16 ENCOUNTER — Other Ambulatory Visit: Payer: Self-pay

## 2021-06-16 DIAGNOSIS — E1165 Type 2 diabetes mellitus with hyperglycemia: Secondary | ICD-10-CM | POA: Diagnosis not present

## 2021-06-16 DIAGNOSIS — I739 Peripheral vascular disease, unspecified: Secondary | ICD-10-CM

## 2021-06-16 DIAGNOSIS — E1159 Type 2 diabetes mellitus with other circulatory complications: Secondary | ICD-10-CM | POA: Diagnosis not present

## 2021-06-16 LAB — BASIC METABOLIC PANEL
BUN: 17 mg/dL (ref 6–23)
CO2: 28 mEq/L (ref 19–32)
Calcium: 10 mg/dL (ref 8.4–10.5)
Chloride: 101 mEq/L (ref 96–112)
Creatinine, Ser: 0.87 mg/dL (ref 0.40–1.50)
GFR: 97.5 mL/min (ref >60.00)
Glucose, Bld: 131 mg/dL — ABNORMAL HIGH (ref 70–99)
Potassium: 4.6 mEq/L (ref 3.5–5.1)
Sodium: 138 mEq/L (ref 135–145)

## 2021-06-16 LAB — LDL CHOLESTEROL, DIRECT: Direct LDL: 113 mg/dL

## 2021-06-16 LAB — LIPID PANEL
Cholesterol: 174 mg/dL (ref 0–200)
HDL: 32.7 mg/dL — ABNORMAL LOW (ref >39.00)
NonHDL: 141.72
Total CHOL/HDL Ratio: 5
Triglycerides: 275 mg/dL — ABNORMAL HIGH (ref 0.0–149.0)
VLDL: 55 mg/dL — ABNORMAL HIGH (ref 0.0–40.0)

## 2021-06-16 LAB — HEPATIC FUNCTION PANEL
ALT: 34 U/L (ref 0–53)
AST: 23 U/L (ref 0–37)
Albumin: 4.9 g/dL (ref 3.5–5.2)
Alkaline Phosphatase: 103 U/L (ref 39–117)
Bilirubin, Direct: 0.1 mg/dL (ref 0.0–0.3)
Total Bilirubin: 0.6 mg/dL (ref 0.2–1.2)
Total Protein: 7.3 g/dL (ref 6.0–8.3)

## 2021-06-16 LAB — HEMOGLOBIN A1C: Hgb A1c MFr Bld: 8.4 % — ABNORMAL HIGH (ref 4.6–6.5)

## 2021-06-17 ENCOUNTER — Other Ambulatory Visit: Payer: Self-pay

## 2021-06-17 MED ORDER — TRULICITY 0.75 MG/0.5ML ~~LOC~~ SOAJ: 0.7500 mg | SUBCUTANEOUS | 3 refills | Status: DC

## 2021-06-21 ENCOUNTER — Other Ambulatory Visit: Payer: Self-pay | Admitting: Family Medicine

## 2021-07-03 ENCOUNTER — Other Ambulatory Visit: Payer: Self-pay

## 2021-07-03 DIAGNOSIS — I739 Peripheral vascular disease, unspecified: Secondary | ICD-10-CM

## 2021-07-13 NOTE — Progress Notes (Signed)
Office Note     HPI: Jason [name] is a 55 y.o. (14-Nov-1965) male presenting at the request of .Burchette, Elberta Fortis, MD for left lower extremity pain with ambulation.  Jason [name] has prior surgical history of 11/2017 Dr. Rogers Seeds femoral to above-knee popliteal bypass with vein, left external iliac artery stent.  Jason [name] immigrated to the [place] from [place] 1999.  Exam today, Jason [name] was doing well.  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  Jason [name] has changed his gait in an effort to decrease the calf pain which is now causing right hip concerns.  Jason [name] denies rest pain, tissue loss.  Jason [name] continues to smoke daily  The pt is  on a statin for cholesterol management.  The pt is not on a daily aspirin.   Other AC:  - The pt is  on medication for hypertension.   The pt is  diabetic.  Tobacco hx:  Daily  Past Medical History:  Diagnosis Date   Anxiety    Depression    Diabetes mellitus without complication (HCC)    GERD (gastroesophageal reflux disease)    Headache(784.0)    Hyperlipidemia    Hypertension    MRSA infection    Left shin, back   Nerve damage    BLE   Osteoarthritis    Peripheral vascular disease (HCC)    Pneumonia     Past Surgical History:  Procedure Laterality Date   ABDOMINAL AORTOGRAM W/LOWER EXTREMITY N/A 12/01/2017   Procedure: ABDOMINAL AORTOGRAM W/LOWER EXTREMITY;  Surgeon: Sherren Kerns, MD;  Location: [facility];  Service: Cardiovascular;  Laterality: N/A;   FEMORAL-POPLITEAL BYPASS GRAFT Right 12/11/2017   Procedure: BYPASS GRAFT RIGHT FEMORAL-ABOVE KNEE POPLITEAL ARTERY with Non Reversed Greater Saphenous Vein and Angioplasty distal vein Graft.;  Surgeon: Sherren Kerns, MD;  Location: [facility] OR;  Service: Vascular;  Laterality: Right;   PERIPHERAL VASCULAR BALLOON ANGIOPLASTY  Right 12/01/2017   Procedure: PERIPHERAL VASCULAR BALLOON ANGIOPLASTY;  Surgeon: Sherren Kerns, MD;  Location: [facility];  Service: Cardiovascular;  Laterality: Right;  superficial femoral, attempted   PERIPHERAL VASCULAR INTERVENTION Left 12/01/2017   Procedure: PERIPHERAL VASCULAR INTERVENTION;  Surgeon: Sherren Kerns, MD;  Location: [facility];  Service: Cardiovascular;  Laterality: Left;  External iliac   ROTATOR CUFF REPAIR     TONSILLECTOMY     VEIN HARVEST Right 12/11/2017   Procedure: RIGHT GREATER SAPHENOUS VEIN HARVEST;  Surgeon: Sherren Kerns, MD;  Location: [facility] OR;  Service: Vascular;  Laterality: Right;    Social History   Socioeconomic History   Marital status: Married    Spouse name: Not on file   Number of children: Not on file   Years of education: Not on file   Highest education level: Not on file  Occupational History   Not on file  Tobacco Use   Smoking status: Every Day    Packs/day: 2.00    Years: 25.00    Pack years: 50.00    Types: Cigarettes   Smokeless tobacco: Never   Tobacco comments:    30 cigarettes per day  Vaping Use   Vaping Use: Never used  Substance and Sexual Activity   Alcohol use: Not Currently    Alcohol/week: 20.0 standard drinks    Types: 20 Glasses of  wine per week    Comment: not since Jan 2018   Drug use: No   Sexual activity: Not Currently  Other Topics Concern   Not on file  Social History Narrative   Not on file   Social Determinants of Health   Financial Resource Strain: Not on file  Food Insecurity: Not on file  Transportation Needs: Not on file  Physical Activity: Not on file  Stress: Not on file  Social Connections: Not on file  Intimate Partner Violence: Not on file    Family History  Problem Relation Age of Onset   Heart attack Father 41   Hypertension Father    Arthritis Mother    Asthma Maternal Grandfather    Stroke Paternal Grandfather    Heart disease Other    Mental illness  Other    Cancer Neg Hx    Diabetes Neg Hx    Drug abuse Neg Hx    Early death Neg Hx    Hearing loss Neg Hx    Hyperlipidemia Neg Hx     Current Outpatient Medications  Medication Sig Dispense Refill   amitriptyline (ELAVIL) 25 MG tablet TAKE 1 TABLET BY MOUTH EVERYDAY AT BEDTIME 90 tablet 1   atorvastatin (LIPITOR) 40 MG tablet TAKE 1 TABLET BY MOUTH EVERY DAY 90 tablet 1   Blood Glucose Monitoring Suppl (CONTOUR BLOOD GLUCOSE SYSTEM) DEVI Use once daily for diabetes Dx E11.9 1 Device 0   celecoxib (CELEBREX) 200 MG capsule TAKE 1 CAPSULE BY MOUTH EVERY DAY 30 capsule 1   CONTOUR NEXT TEST test strip TEST GLUCOSE ONCE DAILY. DX E 11.9 100 strip 3   Dulaglutide (TRULICITY) 0.75 MG/0.5ML SOPN Inject 0.75 mg into the skin once a week. 4 mL 3   DULoxetine (CYMBALTA) 60 MG capsule TAKE 1 CAPSULE BY MOUTH EVERY DAY 90 capsule 0   ezetimibe (ZETIA) 10 MG tablet TAKE 1 TABLET BY MOUTH EVERY DAY 90 tablet 1   gabapentin (NEURONTIN) 300 MG capsule Take 2 capsules by mouth twice daily 360 capsule 3   JANUVIA 100 MG tablet TAKE 1 TABLET BY MOUTH EVERY DAY 90 tablet 3   JARDIANCE 10 MG TABS tablet TAKE 1 TABLET BY MOUTH DAILY 90 tablet 3   Lancets MISC Contour Lancets.  Test glucose once daily. DxE11.9 100 each 3   metFORMIN (GLUCOPHAGE) 500 MG tablet TAKE 2 TABLETS BY MOUTH TWICE A DAY 360 tablet 0   No current facility-administered medications for this visit.    Allergies  Allergen Reactions   Olmesartan Medoxomil Other (See Comments)    REACTION: dizziness     REVIEW OF SYSTEMS:   [X]  denotes positive finding, [ ]  denotes negative finding Cardiac  Comments:  Chest pain or chest pressure:    Shortness of breath upon exertion:    Short of breath when lying flat:    Irregular heart rhythm:        Vascular    Pain in calf, thigh, or hip brought on by ambulation: X   Pain in feet at night that wakes you up from your sleep:     Blood clot in your veins:    Leg swelling:          Pulmonary    Oxygen at home:    Productive cough:     Wheezing:         Neurologic    Sudden weakness in arms or legs:     Sudden numbness in arms or legs:  Sudden onset of difficulty speaking or slurred speech:    Temporary loss of vision in one eye:     Problems with dizziness:         Gastrointestinal    Blood in stool:     Vomited blood:         Genitourinary    Burning when urinating:     Blood in urine:        Psychiatric    Major depression:         Hematologic    Bleeding problems:    Problems with blood clotting too easily:        Skin    Rashes or ulcers:        Constitutional    Fever or chills:      PHYSICAL EXAMINATION:  There were no vitals filed for this visit.  General:  WDWN in NAD; vital signs documented above Gait: Not observed HENT: WNL, normocephalic Pulmonary: normal non-labored breathing , without Rales, rhonchi,  wheezing Cardiac: regular HR Abdomen: soft, NT, no masses Skin: without rashes Vascular Exam/Pulses:  Right Left  Radial 2+ (normal) 2+ (normal)  Ulnar 2+ (normal) 2+ (normal)  Femoral 2+ (normal) 2+ (normal)      DP 2+ (normal) absent  PT 2+ (normal) absent   Extremities: without ischemic changes, without Gangrene , without cellulitis; without open wounds;  Musculoskeletal: no muscle wasting or atrophy  Neurologic: A&O X 3;  No focal weakness or paresthesias are detected Psychiatric:  The pt has Normal affect.   Non-Invasive Vascular Imaging:   ABI Findings:  +---------+------------------+-----+---------+--------+  Right    Rt Pressure (mmHg)IndexWaveform Comment   +---------+------------------+-----+---------+--------+  Brachial 159                                       +---------+------------------+-----+---------+--------+  PTA      160               0.99 triphasic          +---------+------------------+-----+---------+--------+  DP       146               0.90 triphasic           +---------+------------------+-----+---------+--------+  Great Toe113               0.70                    +---------+------------------+-----+---------+--------+   +---------+------------------+-----+----------+-------+  Left     Lt Pressure (mmHg)IndexWaveform  Comment  +---------+------------------+-----+----------+-------+  Brachial 162                                       +---------+------------------+-----+----------+-------+  PTA      73                0.45 monophasic         +---------+------------------+-----+----------+-------+  DP       78                0.48 monophasic         +---------+------------------+-----+----------+-------+  Great Toe40                0.25                    +---------+------------------+-----+----------+-------+   +-------+-----------+-----------+------------+------------+  ABI/TBIToday's ABIToday's TBIPrevious ABIPrevious TBI  +-------+-----------+-----------+------------+------------+  Right  0.99       0.70       1.03        0.76          +-------+-----------+-----------+------------+------------+  Left   0.48       0.25       0.95        0.64          +-------+-----------+-----------+------------+------------+     ASSESSMENT/PLAN: HIEU HERMS is a 55 y.o. male presenting with lifestyle limiting claudication.  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 The pain has become so Menz in his calf, that Jason [name] will drag his leg behind him in an effort to quell the calf cramping so Jason [name] can continue throughout his day.  This is now led to right hip pain.  Yordan's peripheral arterial disease is defined as Rutherford 3 critical limb ischemia  After discussing the risks and benefits of left lower extremity angiography in an effort to improve distal perfusion, Cage elected to proceed.  Recommend the following which can slow the progression of  atherosclerosis and reduce the risk of major adverse cardiac / limb events:  Aspirin 81mg  PO QD.  Atorvastatin 40-80mg  PO QD (or other "high intensity" statin therapy). Complete cessation from all tobacco products. Blood glucose control with goal A1c < 7%. Blood pressure control with goal blood pressure < 140/90 mmHg. Lipid reduction therapy with goal LDL-C <100 mg/dL ( if symptomatic from PAD).    <16, MD [facility] [phone]

## 2021-07-16 ENCOUNTER — Ambulatory Visit (HOSPITAL_COMMUNITY)
Admission: RE | Admit: 2021-07-16 | Discharge: 2021-07-16 | Disposition: A | Discharge status: Home / Self Care | Payer: Managed Care, Other (non HMO) | Source: Ambulatory Visit | Attending: Vascular Surgery | Admitting: Vascular Surgery

## 2021-07-16 ENCOUNTER — Ambulatory Visit: Payer: Managed Care, Other (non HMO) | Admitting: Vascular Surgery

## 2021-07-16 ENCOUNTER — Encounter: Payer: Self-pay | Admitting: Vascular Surgery

## 2021-07-16 ENCOUNTER — Other Ambulatory Visit: Payer: Self-pay

## 2021-07-16 VITALS — BP 146/70 | HR 78 | Temp 98.5°F | Resp 20 | Ht 70.0 in | Wt 216.0 lb

## 2021-07-16 DIAGNOSIS — I739 Peripheral vascular disease, unspecified: Secondary | ICD-10-CM

## 2021-07-16 DIAGNOSIS — I70222 Atherosclerosis of native arteries of extremities with rest pain, left leg: Secondary | ICD-10-CM

## 2021-07-21 ENCOUNTER — Encounter (HOSPITAL_COMMUNITY)
Admission: RE | Disposition: A | Discharge status: Home / Self Care | Payer: Self-pay | Source: Home / Self Care | Attending: Vascular Surgery

## 2021-07-21 ENCOUNTER — Ambulatory Visit (HOSPITAL_COMMUNITY)
Admission: RE | Admit: 2021-07-21 | Discharge: 2021-07-21 | Disposition: A | Discharge status: Home / Self Care | Payer: Managed Care, Other (non HMO) | Attending: Vascular Surgery | Admitting: Vascular Surgery

## 2021-07-21 ENCOUNTER — Other Ambulatory Visit: Payer: Self-pay

## 2021-07-21 DIAGNOSIS — M25551 Pain in right hip: Secondary | ICD-10-CM | POA: Insufficient documentation

## 2021-07-21 DIAGNOSIS — I70212 Atherosclerosis of native arteries of extremities with intermittent claudication, left leg: Secondary | ICD-10-CM

## 2021-07-21 DIAGNOSIS — I10 Essential (primary) hypertension: Secondary | ICD-10-CM | POA: Insufficient documentation

## 2021-07-21 DIAGNOSIS — E1151 Type 2 diabetes mellitus with diabetic peripheral angiopathy without gangrene: Secondary | ICD-10-CM | POA: Diagnosis not present

## 2021-07-21 DIAGNOSIS — Z7982 Long term (current) use of aspirin: Secondary | ICD-10-CM | POA: Insufficient documentation

## 2021-07-21 DIAGNOSIS — Z79899 Other long term (current) drug therapy: Secondary | ICD-10-CM | POA: Insufficient documentation

## 2021-07-21 DIAGNOSIS — F17210 Nicotine dependence, cigarettes, uncomplicated: Secondary | ICD-10-CM | POA: Diagnosis not present

## 2021-07-21 HISTORY — PX: ABDOMINAL AORTOGRAM W/LOWER EXTREMITY: CATH118223

## 2021-07-21 HISTORY — PX: PERIPHERAL VASCULAR INTERVENTION: CATH118257

## 2021-07-21 LAB — POCT I-STAT, CHEM 8
BUN: 19 mg/dL (ref 6–20)
Calcium, Ion: 1.17 mmol/L (ref 1.15–1.40)
Chloride: 106 mmol/L (ref 98–111)
Creatinine, Ser: 0.5 mg/dL — ABNORMAL LOW (ref 0.61–1.24)
Glucose, Bld: 139 mg/dL — ABNORMAL HIGH (ref 70–99)
HCT: 47 % (ref 39.0–52.0)
Hemoglobin: 16 g/dL (ref 13.0–17.0)
Potassium: 4.6 mmol/L (ref 3.5–5.1)
Sodium: 141 mmol/L (ref 135–145)
TCO2: 26 mmol/L (ref 22–32)

## 2021-07-21 LAB — POCT ACTIVATED CLOTTING TIME
Activated Clotting Time: 225 seconds
Activated Clotting Time: 173 seconds

## 2021-07-21 LAB — GLUCOSE, CAPILLARY: Glucose-Capillary: 111 mg/dL — ABNORMAL HIGH (ref 70–99)

## 2021-07-21 SURGERY — ABDOMINAL AORTOGRAM W/LOWER EXTREMITY
Anesthesia: LOCAL | Laterality: Left

## 2021-07-21 MED ORDER — HEPARIN (PORCINE) IN NACL 1000-0.9 UT/500ML-% IV SOLN
INTRAVENOUS | Status: DC | PRN
  Administered 2021-07-21 (×2): 500 mL

## 2021-07-21 MED ORDER — CLOPIDOGREL BISULFATE 300 MG PO TABS
ORAL_TABLET | ORAL | Status: DC | PRN
  Administered 2021-07-21: 300 mg via ORAL

## 2021-07-21 MED ORDER — CLOPIDOGREL BISULFATE 75 MG PO TABS: 75.0000 mg | ORAL_TABLET | Freq: Every day | ORAL | 11 refills | Status: DC

## 2021-07-21 MED ORDER — ACETAMINOPHEN 325 MG PO TABS: 650.0000 mg | ORAL_TABLET | ORAL | Status: DC | PRN

## 2021-07-21 MED ORDER — SODIUM CHLORIDE 0.9 % IV SOLN: 250.0000 mL | INTRAVENOUS | Status: DC | PRN

## 2021-07-21 MED ORDER — SODIUM CHLORIDE 0.9 % IV SOLN: INTRAVENOUS | Status: DC

## 2021-07-21 MED ORDER — FENTANYL CITRATE (PF) 100 MCG/2ML IJ SOLN
INTRAMUSCULAR | Status: DC | PRN
  Administered 2021-07-21: 50 ug via INTRAVENOUS

## 2021-07-21 MED ORDER — LIDOCAINE HCL (PF) 1 % IJ SOLN
INTRAMUSCULAR | Status: AC
  Filled 2021-07-21: qty 30

## 2021-07-21 MED ORDER — HEPARIN (PORCINE) 25000 UT/250ML-% IV SOLN
1250.0000 [IU]/h | INTRAVENOUS | Status: DC
  Filled 2021-07-21 (×2): qty 250

## 2021-07-21 MED ORDER — SODIUM CHLORIDE 0.9% FLUSH: 3.0000 mL | INTRAVENOUS | Status: DC | PRN

## 2021-07-21 MED ORDER — FENTANYL CITRATE (PF) 100 MCG/2ML IJ SOLN
INTRAMUSCULAR | Status: AC
  Filled 2021-07-21: qty 2

## 2021-07-21 MED ORDER — LIDOCAINE HCL (PF) 1 % IJ SOLN
INTRAMUSCULAR | Status: DC | PRN
  Administered 2021-07-21: 20 mL via INTRADERMAL

## 2021-07-21 MED ORDER — LABETALOL HCL 5 MG/ML IV SOLN: 10.0000 mg | INTRAVENOUS | Status: DC | PRN

## 2021-07-21 MED ORDER — HYDRALAZINE HCL 20 MG/ML IJ SOLN: 5.0000 mg | INTRAMUSCULAR | Status: DC | PRN

## 2021-07-21 MED ORDER — MIDAZOLAM HCL 2 MG/2ML IJ SOLN
INTRAMUSCULAR | Status: AC
  Filled 2021-07-21: qty 2

## 2021-07-21 MED ORDER — CLOPIDOGREL BISULFATE 300 MG PO TABS
ORAL_TABLET | ORAL | Status: AC
  Filled 2021-07-21: qty 1

## 2021-07-21 MED ORDER — HEPARIN SODIUM (PORCINE) 1000 UNIT/ML IJ SOLN
INTRAMUSCULAR | Status: DC | PRN
  Administered 2021-07-21: 9000 [IU] via INTRAVENOUS

## 2021-07-21 MED ORDER — IODIXANOL 320 MG/ML IV SOLN
INTRAVENOUS | Status: DC | PRN
  Administered 2021-07-21: 140 mL

## 2021-07-21 MED ORDER — HEPARIN (PORCINE) IN NACL 1000-0.9 UT/500ML-% IV SOLN
INTRAVENOUS | Status: AC
  Filled 2021-07-21: qty 1000

## 2021-07-21 MED ORDER — HEPARIN SODIUM (PORCINE) 1000 UNIT/ML IJ SOLN
INTRAMUSCULAR | Status: AC
  Filled 2021-07-21: qty 1

## 2021-07-21 MED ORDER — SODIUM CHLORIDE 0.9% FLUSH: 3.0000 mL | Freq: Two times a day (BID) | INTRAVENOUS | Status: DC

## 2021-07-21 MED ORDER — SODIUM CHLORIDE 0.9 % WEIGHT BASED INFUSION: 1.0000 mL/kg/h | INTRAVENOUS | Status: DC

## 2021-07-21 MED ORDER — CLOPIDOGREL BISULFATE 75 MG PO TABS: 75.0000 mg | ORAL_TABLET | Freq: Every day | ORAL | Status: DC

## 2021-07-21 MED ORDER — MIDAZOLAM HCL 2 MG/2ML IJ SOLN
INTRAMUSCULAR | Status: DC | PRN
  Administered 2021-07-21: 2 mg via INTRAVENOUS

## 2021-07-21 MED ORDER — ONDANSETRON HCL 4 MG/2ML IJ SOLN: 4.0000 mg | Freq: Four times a day (QID) | INTRAMUSCULAR | Status: DC | PRN

## 2021-07-21 SURGICAL SUPPLY — 19 items
BALLN MUSTANG 5X150X135 (BALLOONS) ×3
BALLN MUSTANG 6X200X135 (BALLOONS) ×3
BALLOON MUSTANG 5X150X135 (BALLOONS) IMPLANT
BALLOON MUSTANG 6X200X135 (BALLOONS) IMPLANT
CATH OMNI FLUSH 5F 65CM (CATHETERS) ×1 IMPLANT
CATH QUICKCROSS SUPP .035X90CM (MICROCATHETER) ×1 IMPLANT
GLIDEWIRE ADV .035X260CM (WIRE) ×1 IMPLANT
KIT ENCORE 26 ADVANTAGE (KITS) ×1 IMPLANT
KIT MICROPUNCTURE NIT STIFF (SHEATH) ×1 IMPLANT
KIT PV (KITS) ×3 IMPLANT
SHEATH FLEX ANSEL ANG 6F 45CM (SHEATH) ×1 IMPLANT
SHEATH PINNACLE 5F 10CM (SHEATH) ×1 IMPLANT
SHEATH PROBE COVER 6X72 (BAG) ×1 IMPLANT
STENT ELUVIA 7X120X130 (Permanent Stent) ×1 IMPLANT
STENT ELUVIA 7X150X130 (Permanent Stent) ×1 IMPLANT
SYR MEDRAD MARK V 150ML (SYRINGE) ×1 IMPLANT
TRANSDUCER W/STOPCOCK (MISCELLANEOUS) ×3 IMPLANT
TRAY PV CATH (CUSTOM PROCEDURE TRAY) ×3 IMPLANT
WIRE BENTSON .035X145CM (WIRE) ×1 IMPLANT

## 2021-07-21 NOTE — Progress Notes (Signed)
Pt ambulated without difficulty or bleeding.   Discharged home with his wife who will drive and stay with pt x 24 hrs. 

## 2021-07-21 NOTE — Progress Notes (Signed)
ANTICOAGULATION CONSULT NOTE - Initial Consult  Pharmacy Consult for heparin  Indication: limb ischemia  Allergies  Allergen Reactions   Olmesartan Medoxomil Other (See Comments)    dizziness    Patient Measurements: Height: 5\' 10"  (177.8 cm) Weight: 98 kg (216 lb) IBW/kg (Calculated) : 73 HEPARIN DW (KG): 93.3   Vital Signs: Temp: 97.7 F (36.5 C) (11/23 0756) Temp Source: Oral (11/23 0756) BP: 146/78 (11/23 1151) Pulse Rate: 65 (11/23 1151)  Labs: Recent Labs    07/21/21 0822  HGB 16.0  HCT 47.0  CREATININE 0.50*    Estimated Creatinine Clearance: 122.5 mL/min (A) (by C-G formula based on SCr of 0.5 mg/dL (L)).   Medical History: Past Medical History:  Diagnosis Date   Anxiety    Depression    Diabetes mellitus without complication (HCC)    GERD (gastroesophageal reflux disease)    Headache(784.0)    Hyperlipidemia    Hypertension    MRSA infection    Left shin, back   Nerve damage    BLE   Osteoarthritis    Peripheral vascular disease (HCC)    Pneumonia     Medications:  Medications Prior to Admission  Medication Sig Dispense Refill Last Dose   amitriptyline (ELAVIL) 25 MG tablet TAKE 1 TABLET BY MOUTH EVERYDAY AT BEDTIME 90 tablet 1 07/20/2021   atorvastatin (LIPITOR) 40 MG tablet TAKE 1 TABLET BY MOUTH EVERY DAY 90 tablet 1 07/20/2021   celecoxib (CELEBREX) 200 MG capsule TAKE 1 CAPSULE BY MOUTH EVERY DAY 30 capsule 1 07/20/2021   Dulaglutide (TRULICITY) 0.75 MG/0.5ML SOPN Inject 0.75 mg into the skin once a week. (Patient taking differently: Inject 0.75 mg into the skin every Saturday.) 4 mL 3 Past Week   DULoxetine (CYMBALTA) 60 MG capsule TAKE 1 CAPSULE BY MOUTH EVERY DAY 90 capsule 0 07/20/2021   ezetimibe (ZETIA) 10 MG tablet TAKE 1 TABLET BY MOUTH EVERY DAY 90 tablet 1 07/20/2021   gabapentin (NEURONTIN) 300 MG capsule Take 2 capsules by mouth twice daily 360 capsule 3 07/20/2021   JARDIANCE 10 MG TABS tablet TAKE 1 TABLET BY MOUTH DAILY 90  tablet 3 07/20/2021   metFORMIN (GLUCOPHAGE) 500 MG tablet TAKE 2 TABLETS BY MOUTH TWICE A DAY 360 tablet 0 07/20/2021   Blood Glucose Monitoring Suppl (CONTOUR BLOOD GLUCOSE SYSTEM) DEVI Use once daily for diabetes Dx E11.9 1 Device 0    CONTOUR NEXT TEST test strip TEST GLUCOSE ONCE DAILY. DX E 11.9 100 strip 3    Lancets MISC Contour Lancets.  Test glucose once daily. DxE11.9 100 each 3      Assessment: 55 yo male with critical limb ischemia (hx right femoral to above-knee popliteal bypass with vein, left external iliac artery stent). He is in cath lab now and plans for procedure later today. Pharmacy consulted to dose heparin  -spoke to Dr. 53: hold off on heparin for now. Likely plans for heparin after his next procedure  Goal of Therapy:  Heparin level 0.3-0.7 units/ml Monitor platelets by anticoagulation protocol: Yes   Plan:  -Will follow plans post procedure  Sherral Hammers, PharmD Clinical Pharmacist **Pharmacist phone directory can now be found on [email or web address] ([email or web address]).  Listed under [facility].

## 2021-07-21 NOTE — H&P (Signed)
Office Note   Patient seen and examined in preop holding.  No complaints. No changes to medication history or physical exam since last seen in clinic. After discussing the risks and benefits of LEFT leg angiogram for lifestyle-limiting claudication, Jason [name] elected to proceed.   Victorino Sparrow MD   HPI: Jason [name] is a 55 y.o. ([birth year]) male presenting at the request of .Burchette, Elberta Fortis, MD for left lower extremity pain with ambulation.  Jason [name] has prior surgical history of 11/2017 Dr. Rogers Seeds femoral to above-knee popliteal bypass with vein, left external iliac artery stent.  He immigrated to the [place] from [place] 1999.  Exam today, Jason [name] was doing well.  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  Now, he is struggling to perform his duties as a Fish farm manager due to left calf pain.  He has changed his gait in an effort to decrease the calf pain which is now causing right hip concerns.  He denies rest pain, tissue loss.  He continues to smoke daily  The pt is  on a statin for cholesterol management.  The pt is not on a daily aspirin.   Other AC:  - The pt is  on medication for hypertension.   The pt is  diabetic.  Tobacco hx:  Daily  Past Medical History:  Diagnosis Date   Anxiety    Depression    Diabetes mellitus without complication (HCC)    GERD (gastroesophageal reflux disease)    Headache(784.0)    Hyperlipidemia    Hypertension    MRSA infection    Left shin, back   Nerve damage    BLE   Osteoarthritis    Peripheral vascular disease (HCC)    Pneumonia     Past Surgical History:  Procedure Laterality Date   ABDOMINAL AORTOGRAM W/LOWER EXTREMITY N/A 12/01/2017   Procedure: ABDOMINAL AORTOGRAM W/LOWER EXTREMITY;  Surgeon: Sherren Kerns, MD;  Location: [facility];  Service: Cardiovascular;  Laterality: N/A;   FEMORAL-POPLITEAL BYPASS GRAFT  Right 12/11/2017   Procedure: BYPASS GRAFT RIGHT FEMORAL-ABOVE KNEE POPLITEAL ARTERY with Non Reversed Greater Saphenous Vein and Angioplasty distal vein Graft.;  Surgeon: Sherren Kerns, MD;  Location: [facility];  Service: Vascular;  Laterality: Right;   PERIPHERAL VASCULAR BALLOON ANGIOPLASTY Right 12/01/2017   Procedure: PERIPHERAL VASCULAR BALLOON ANGIOPLASTY;  Surgeon: Sherren Kerns, MD;  Location: [facility];  Service: Cardiovascular;  Laterality: Right;  superficial femoral, attempted   PERIPHERAL VASCULAR INTERVENTION Left 12/01/2017   Procedure: PERIPHERAL VASCULAR INTERVENTION;  Surgeon: Sherren Kerns, MD;  Location: [facility];  Service: Cardiovascular;  Laterality: Left;  External iliac   ROTATOR CUFF REPAIR     TONSILLECTOMY     VEIN HARVEST Right 12/11/2017   Procedure: RIGHT GREATER SAPHENOUS VEIN HARVEST;  Surgeon: Sherren Kerns, MD;  Location: [facility] OR;  Service: Vascular;  Laterality: Right;    Social History   Socioeconomic History   Marital status: Married    Spouse name: Not on file   Number of children: Not on file   Years of education: Not on file   Highest education level: Not on file  Occupational History   Not on file  Tobacco Use   Smoking status: Every Day    Packs/day: 2.00    Years: 25.00    Pack years: 50.00    Types: Cigarettes  Smokeless tobacco: Never   Tobacco comments:    30 cigarettes per day  Vaping Use   Vaping Use: Never used  Substance and Sexual Activity   Alcohol use: Not Currently    Alcohol/week: 20.0 standard drinks    Types: 20 Glasses of wine per week    Comment: not since Jan 2018   Drug use: No   Sexual activity: Not Currently  Other Topics Concern   Not on file  Social History Narrative   Not on file   Social Determinants of Health   Financial Resource Strain: Not on file  Food Insecurity: Not on file  Transportation Needs: Not on file  Physical Activity: Not on file  Stress: Not on file  Social  Connections: Not on file  Intimate Partner Violence: Not on file    Family History  Problem Relation Age of Onset   Heart attack Father 70   Hypertension Father    Arthritis Mother    Asthma Maternal Grandfather    Stroke Paternal Grandfather    Heart disease Other    Mental illness Other    Cancer Neg Hx    Diabetes Neg Hx    Drug abuse Neg Hx    Early death Neg Hx    Hearing loss Neg Hx    Hyperlipidemia Neg Hx     Current Facility-Administered Medications  Medication Dose Route Frequency Provider Last Rate Last Admin   0.9 %  sodium chloride infusion   Intravenous Continuous Victorino Sparrow, MD 100 mL/hr at 07/21/21 0828 New Bag at 07/21/21 0828    Allergies  Allergen Reactions   Olmesartan Medoxomil Other (See Comments)    dizziness     REVIEW OF SYSTEMS:   [X]  denotes positive finding, [ ]  denotes negative finding Cardiac  Comments:  Chest pain or chest pressure:    Shortness of breath upon exertion:    Short of breath when lying flat:    Irregular heart rhythm:        Vascular    Pain in calf, thigh, or hip brought on by ambulation: X   Pain in feet at night that wakes you up from your sleep:     Blood clot in your veins:    Leg swelling:         Pulmonary    Oxygen at home:    Productive cough:     Wheezing:         Neurologic    Sudden weakness in arms or legs:     Sudden numbness in arms or legs:     Sudden onset of difficulty speaking or slurred speech:    Temporary loss of vision in one eye:     Problems with dizziness:         Gastrointestinal    Blood in stool:     Vomited blood:         Genitourinary    Burning when urinating:     Blood in urine:        Psychiatric    Major depression:         Hematologic    Bleeding problems:    Problems with blood clotting too easily:        Skin    Rashes or ulcers:        Constitutional    Fever or chills:      PHYSICAL EXAMINATION:  Vitals:   07/21/21 0756  BP: (!) 156/78   Pulse: 74  Resp: 12  Temp: 97.7 F (36.5 C)  TempSrc: Oral  SpO2: 98%  Weight: 98 kg  Height: 5\' 10"  (1.778 m)    General:  WDWN in NAD; vital signs documented above Gait: Not observed HENT: WNL, normocephalic Pulmonary: normal non-labored breathing , without Rales, rhonchi,  wheezing Cardiac: regular HR Abdomen: soft, NT, no masses Skin: without rashes Vascular Exam/Pulses:  Right Left  Radial 2+ (normal) 2+ (normal)  Ulnar 2+ (normal) 2+ (normal)  Femoral 2+ (normal) 2+ (normal)      DP 2+ (normal) absent  PT 2+ (normal) absent   Extremities: without ischemic changes, without Gangrene , without cellulitis; without open wounds;  Musculoskeletal: no muscle wasting or atrophy  Neurologic: A&O X 3;  No focal weakness or paresthesias are detected Psychiatric:  The pt has Normal affect.   Non-Invasive Vascular Imaging:   ABI Findings:  +---------+------------------+-----+---------+--------+  Right    Rt Pressure (mmHg)IndexWaveform Comment   +---------+------------------+-----+---------+--------+  Brachial 159                                       +---------+------------------+-----+---------+--------+  PTA      160               0.99 triphasic          +---------+------------------+-----+---------+--------+  DP       146               0.90 triphasic          +---------+------------------+-----+---------+--------+  Great Toe113               0.70                    +---------+------------------+-----+---------+--------+   +---------+------------------+-----+----------+-------+  Left     Lt Pressure (mmHg)IndexWaveform  Comment  +---------+------------------+-----+----------+-------+  Brachial 162                                       +---------+------------------+-----+----------+-------+  PTA      73                0.45 monophasic         +---------+------------------+-----+----------+-------+  DP       78                 0.48 monophasic         +---------+------------------+-----+----------+-------+  Great Toe40                0.25                    +---------+------------------+-----+----------+-------+   +-------+-----------+-----------+------------+------------+  ABI/TBIToday's ABIToday's TBIPrevious ABIPrevious TBI  +-------+-----------+-----------+------------+------------+  Right  0.99       0.70       1.03        0.76          +-------+-----------+-----------+------------+------------+  Left   0.48       0.25       0.95        0.64          +-------+-----------+-----------+------------+------------+     ASSESSMENT/PLAN: RIAN BUSCHE is a 55 y.o. male presenting with lifestyle limiting claudication.  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 The pain has  become so Menz in his calf, that he will drag his leg behind him in an effort to quell the calf cramping so he can continue throughout his day.  This is now led to right hip pain.  Loraine's peripheral arterial disease is defined as Rutherford 3 critical limb ischemia  After discussing the risks and benefits of left lower extremity angiography in an effort to improve distal perfusion, Anguel elected to proceed.  Recommend the following which can slow the progression of atherosclerosis and reduce the risk of major adverse cardiac / limb events:  Aspirin 81mg  PO QD.  Atorvastatin 40-80mg  PO QD (or other "high intensity" statin therapy). Complete cessation from all tobacco products. Blood glucose control with goal A1c < 7%. Blood pressure control with goal blood pressure < 140/90 mmHg. Lipid reduction therapy with goal LDL-C <100 mg/dL ( if symptomatic from PAD).    <16, MD [facility] [phone]

## 2021-07-21 NOTE — Op Note (Signed)
Patient name: Jason [name] MRN: [id number] DOB: Aug 31, 1965 Sex: male  07/21/2021 Pre-operative Diagnosis: Left lower extremity lifestyle limiting claudication Post-operative diagnosis:  Same Surgeon:  Victorino Sparrow, MD Procedure Performed: 1.  Ultrasound-guided micropuncture access of the right common femoral artery 2.  Aortogram 3.  Bilateral lower extremity angiogram 4.  Femoral-popliteal angioplasty and stenting 5.  Closure managed with manual pressure 6.  56 minutes moderate sedation    Indications: Patient is a 55 year old male with previous vascular surgery history of being right-sided femoral to above-knee popliteal artery with greater saphenous vein.  He presented to my office with a several month history of left lower extremity lifestyle limiting claudication.  He works full-time as a Surveyor, minerals, and is unable to complete daily work due to the pain.  He has changed his gait in an effort to manage the pain as well, which is now caused right hip pain at his previous hip replacement site.  After discussing the risk and benefits of left lower extremity angiography with possible intervention, Jason [name] elected to proceed  Findings:  Aortogram: Normal bilateral renal arteries, normal infrarenal abdominal aorta, bilateral common iliac arteries normal, external iliac arteries normal, internal iliac arteries normal.  Previously placed left external iliac stent widely patent.  On the right: Angiography was completed to the knee in an effort to assess the previous femoral to above-knee popliteal artery bypass.  There was no flow-limiting stenosis appreciated within the bypass.  On the left: Normal common femoral artery.  The superficial femoral artery was occluded at its ostia with reconstitution at the distal superficial femoral artery at Hunter's canal.  The popliteal artery, and tibial arteries were normal.  Pedal arch preserved in the foot.   Procedure:  The patient was identified  in the holding area and taken to room 8.  The patient was then placed supine on the table and prepped and draped in the usual sterile fashion.  A time out was called.  Ultrasound was used to evaluate the right common femoral artery.  It was patent .  A digital ultrasound image was acquired.  A micropuncture needle was used to access the right common femoral artery under ultrasound guidance.  An 018 wire was advanced without resistance and a micropuncture sheath was placed.  The 018 wire was removed and a benson wire was placed.  The micropuncture sheath was exchanged for a 5 french sheath.  An omniflush catheter was advanced over the wire to the level of L-1.  An abdominal angiogram was obtained.  Next, using the omniflush catheter and a benson wire, the aortic bifurcation was crossed and the catheter was placed into theleft external iliac artery and left runoff was obtained.  right runoff was performed via retrograde sheath injections.  See findings above.  I made the decision to intervene on the femoral-popliteal occlusion. 6 x 45 sheath was brought to the field and parked in the common femoral artery.  Next, a series of wires and catheters and 0.035 size were used to traverse the superficial femoral artery occlusion.  True lumen was confirmed using using a catheter.  Next, the lesion was predilated using a 5 mm balloon.  Once predilated, a 7 x 150 mm Eluvia stent, and 7 x 120 Eluvia stent were brought onto the field.  These were deployed in succession from the distal SFA to the ostia of the superficial femoral artery.  These were postdilated using a 6 mm balloon.  Completion angiography demonstrated an excellent result with  recanalization of the left superficial femoral artery.  Patient was started on dual antiplatelet therapy and statin medication. He will follow up in my office in 1 month with repeat ABI and duplex ultrasound.   Fara Olden, MD [facility] [place] Office:  [phone]

## 2021-07-21 NOTE — Progress Notes (Signed)
Site area: rt groin arterial sheath pulled by Harriette Ohara Site Prior to Removal:  Level 0 Pressure Applied For: 20 minutes Manual:   yes Patient Status During Pull:  stable Post Pull Site:  Level 0 Post Pull Instructions Given:  yes Post Pull Pulses Present: rt dp dopplered Dressing Applied:  gauze and tegaderm Bedrest begins @ 1335 Comments:

## 2021-07-23 ENCOUNTER — Encounter (HOSPITAL_COMMUNITY): Payer: Self-pay | Admitting: Vascular Surgery

## 2021-08-02 LAB — HM DIABETES EYE EXAM

## 2021-08-08 ENCOUNTER — Other Ambulatory Visit: Payer: Self-pay | Admitting: Family Medicine

## 2021-08-14 ENCOUNTER — Other Ambulatory Visit: Payer: Self-pay

## 2021-08-14 DIAGNOSIS — I739 Peripheral vascular disease, unspecified: Secondary | ICD-10-CM

## 2021-08-26 NOTE — Progress Notes (Signed)
Office Note     HPI: Jason [name] is a 55 y.o. (26-Apr-1966) male presenting in follow up s/p 07/21/21 LLE SFA/Pop stenting for lifestyle-limiting claudication. Pt also has a history of 11/2017 right femoral to above-knee popliteal bypass with vein, left external iliac artery stent with Dr. Darrick Penna.  He immigrated to the [place] from [place] 1999.  Exam today, he was doing well.  He had no complaints.  He is back to work without any medication symptoms.  He continues to smoke, but is working on weaning.  The pt is  on a statin for cholesterol management.  The pt is not on a daily aspirin.   Other AC:  plavix The pt is  on medication for hypertension.   The pt is diabetic.  Tobacco hx:  Daily  Past Medical History:  Diagnosis Date   Anxiety    Depression    Diabetes mellitus without complication (HCC)    GERD (gastroesophageal reflux disease)    Headache(784.0)    Hyperlipidemia    Hypertension    MRSA infection    Left shin, back   Nerve damage    BLE   Osteoarthritis    Peripheral vascular disease (HCC)    Pneumonia     Past Surgical History:  Procedure Laterality Date   ABDOMINAL AORTOGRAM W/LOWER EXTREMITY N/A 12/01/2017   Procedure: ABDOMINAL AORTOGRAM W/LOWER EXTREMITY;  Surgeon: Sherren Kerns, MD;  Location: [facility];  Service: Cardiovascular;  Laterality: N/A;   ABDOMINAL AORTOGRAM W/LOWER EXTREMITY Bilateral 07/21/2021   Procedure: ABDOMINAL AORTOGRAM W/LOWER EXTREMITY;  Surgeon: Victorino Sparrow, MD;  Location: [facility] INVASIVE CV LAB;  Service: Cardiovascular;  Laterality: Bilateral;   FEMORAL-POPLITEAL BYPASS GRAFT Right 12/11/2017   Procedure: BYPASS GRAFT RIGHT FEMORAL-ABOVE KNEE POPLITEAL ARTERY with Non Reversed Greater Saphenous Vein and Angioplasty distal vein Graft.;  Surgeon: Sherren Kerns, MD;  Location: [facility] OR;  Service: Vascular;  Laterality: Right;   PERIPHERAL VASCULAR BALLOON ANGIOPLASTY Right 12/01/2017   Procedure: PERIPHERAL  VASCULAR BALLOON ANGIOPLASTY;  Surgeon: Sherren Kerns, MD;  Location: [facility];  Service: Cardiovascular;  Laterality: Right;  superficial femoral, attempted   PERIPHERAL VASCULAR INTERVENTION Left 12/01/2017   Procedure: PERIPHERAL VASCULAR INTERVENTION;  Surgeon: Sherren Kerns, MD;  Location: [facility];  Service: Cardiovascular;  Laterality: Left;  External iliac   PERIPHERAL VASCULAR INTERVENTION Left 07/21/2021   Procedure: PERIPHERAL VASCULAR INTERVENTION;  Surgeon: Victorino Sparrow, MD;  Location: [facility] INVASIVE CV LAB;  Service: Cardiovascular;  Laterality: Left;   ROTATOR CUFF REPAIR     TONSILLECTOMY     VEIN HARVEST Right 12/11/2017   Procedure: RIGHT GREATER SAPHENOUS VEIN HARVEST;  Surgeon: Sherren Kerns, MD;  Location: [facility] OR;  Service: Vascular;  Laterality: Right;    Social History   Socioeconomic History   Marital status: Married    Spouse name: Not on file   Number of children: Not on file   Years of education: Not on file   Highest education level: Not on file  Occupational History   Not on file  Tobacco Use   Smoking status: Every Day    Packs/day: 2.00    Years: 25.00    Pack years: 50.00    Types: Cigarettes   Smokeless tobacco: Never   Tobacco comments:    30 cigarettes per day  Vaping Use   Vaping Use: Never used  Substance and Sexual Activity   Alcohol use: Not Currently  Alcohol/week: 20.0 standard drinks    Types: 20 Glasses of wine per week    Comment: not since Jan 2018   Drug use: No   Sexual activity: Not Currently  Other Topics Concern   Not on file  Social History Narrative   Not on file   Social Determinants of Health   Financial Resource Strain: Not on file  Food Insecurity: Not on file  Transportation Needs: Not on file  Physical Activity: Not on file  Stress: Not on file  Social Connections: Not on file  Intimate Partner Violence: Not on file    Family History  Problem Relation Age of Onset   Heart  attack Father 54   Hypertension Father    Arthritis Mother    Asthma Maternal Grandfather    Stroke Paternal Grandfather    Heart disease Other    Mental illness Other    Cancer Neg Hx    Diabetes Neg Hx    Drug abuse Neg Hx    Early death Neg Hx    Hearing loss Neg Hx    Hyperlipidemia Neg Hx     Current Outpatient Medications  Medication Sig Dispense Refill   amitriptyline (ELAVIL) 25 MG tablet TAKE 1 TABLET BY MOUTH EVERYDAY AT BEDTIME 90 tablet 1   atorvastatin (LIPITOR) 40 MG tablet TAKE 1 TABLET BY MOUTH EVERY DAY 90 tablet 1   Blood Glucose Monitoring Suppl (CONTOUR BLOOD GLUCOSE SYSTEM) DEVI Use once daily for diabetes Dx E11.9 1 Device 0   celecoxib (CELEBREX) 200 MG capsule TAKE 1 CAPSULE BY MOUTH EVERY DAY 30 capsule 1   clopidogrel (PLAVIX) 75 MG tablet Take 1 tablet (75 mg total) by mouth daily. 30 tablet 11   CONTOUR NEXT TEST test strip TEST GLUCOSE ONCE DAILY. DX E 11.9 100 strip 3   Dulaglutide (TRULICITY) 0.75 MG/0.5ML SOPN Inject 0.75 mg into the skin once a week. (Patient taking differently: Inject 0.75 mg into the skin every Saturday.) 4 mL 3   DULoxetine (CYMBALTA) 60 MG capsule TAKE 1 CAPSULE BY MOUTH EVERY DAY 90 capsule 0   ezetimibe (ZETIA) 10 MG tablet TAKE 1 TABLET BY MOUTH EVERY DAY 90 tablet 1   gabapentin (NEURONTIN) 300 MG capsule TAKE ONE CAPSULE BY MOUTH EVERY MORNING AND TAKE 2 CAPSULES BY MOUTH AT BEDTIME 270 capsule 0   JARDIANCE 10 MG TABS tablet TAKE 1 TABLET BY MOUTH DAILY 90 tablet 3   Lancets MISC Contour Lancets.  Test glucose once daily. DxE11.9 100 each 3   metFORMIN (GLUCOPHAGE) 500 MG tablet TAKE 2 TABLETS BY MOUTH TWICE A DAY 360 tablet 0   No current facility-administered medications for this visit.    Allergies  Allergen Reactions   Olmesartan Medoxomil Other (See Comments)    dizziness     REVIEW OF SYSTEMS:   [X]  denotes positive finding, [ ]  denotes negative finding Cardiac  Comments:  Chest pain or chest pressure:     Shortness of breath upon exertion:    Short of breath when lying flat:    Irregular heart rhythm:        Vascular    Pain in calf, thigh, or hip brought on by ambulation:    Pain in feet at night that wakes you up from your sleep:     Blood clot in your veins:    Leg swelling:         Pulmonary    Oxygen at home:    Productive cough:  Wheezing:         Neurologic    Sudden weakness in arms or legs:     Sudden numbness in arms or legs:     Sudden onset of difficulty speaking or slurred speech:    Temporary loss of vision in one eye:     Problems with dizziness:         Gastrointestinal    Blood in stool:     Vomited blood:         Genitourinary    Burning when urinating:     Blood in urine:        Psychiatric    Major depression:         Hematologic    Bleeding problems:    Problems with blood clotting too easily:        Skin    Rashes or ulcers:        Constitutional    Fever or chills:      PHYSICAL EXAMINATION:  There were no vitals filed for this visit.  General:  WDWN in NAD; vital signs documented above Gait: Not observed HENT: WNL, normocephalic Pulmonary: normal non-labored breathing , without Rales, rhonchi,  wheezing Cardiac: regular HR Abdomen: soft, NT, no masses Skin: without rashes Vascular Exam/Pulses:  Right Left  Radial 2+ (normal) 2+ (normal)  Ulnar 2+ (normal) 2+ (normal)  Femoral 2+ (normal) 2+ (normal)      DP 2+ 2+  PT 2+ (normal) 2+   Extremities: without ischemic changes, without Gangrene , without cellulitis; without open wounds;  Musculoskeletal: no muscle wasting or atrophy  Neurologic: A&O X 3;  No focal weakness or paresthesias are detected Psychiatric:  The pt has Normal affect.   Non-Invasive Vascular Imaging:     +-------+-----------+-----------+------------+------------+   ABI/TBI Today's ABI Today's TBI Previous ABI Previous TBI   +-------+-----------+-----------+------------+------------+   Right   0.95         0.63        0.99         0.79           +-------+-----------+-----------+------------+------------+   Left    1.07        0.61        0.48         0.25           +-------+-----------+-----------+------------+------------+   +-----------+--------+-----+--------+---------+--------+   LEFT        PSV cm/s Ratio Stenosis Waveform  Comments   +-----------+--------+-----+--------+---------+--------+   CFA Distal  142                     triphasic            +-----------+--------+-----+--------+---------+--------+   DFA         182                     biphasic             +-----------+--------+-----+--------+---------+--------+   SFA Prox                                      stent      +-----------+--------+-----+--------+---------+--------+   SFA Mid  stent      +-----------+--------+-----+--------+---------+--------+   SFA Distal  106                     triphasic            +-----------+--------+-----+--------+---------+--------+   POP Prox    116                     triphasic            +-----------+--------+-----+--------+---------+--------+   POP Distal  93                      triphasic            +-----------+--------+-----+--------+---------+--------+   ATA Distal  111                     triphasic            +-----------+--------+-----+--------+---------+--------+   PTA Distal  117                     triphasic            +-----------+--------+-----+--------+---------+--------+   PERO Distal 52                      triphasic            +-----------+--------+-----+--------+---------+--------+    ASSESSMENT/PLAN: Jason [name] is a 55 y.o. male presenting in follow up s/p 07/21/21 LLE SFA/Pop stenting for lifestyle-limiting claudication. Pt also has a history of 11/2017 right femoral to above-knee popliteal bypass with vein, left external iliac artery stent with Dr. Darrick Penna.  He immigrated to the [place] from [place]  1999. Patient is doing well on exam with palpable pulses in both feet.  The stent is widely patent without stenosis.  Patient would benefit from continued follow-up and smoking cessation.  I will see him back in the office in 6 months for duplex ultrasonography of his left-sided iliac stent, right femoral to above-knee popliteal artery bypass, left-sided SFA popliteal stents  Recommend the following which can slow the progression of atherosclerosis and reduce the risk of major adverse cardiac / limb events:  Aspirin  PO QD.  Atorvastatin 40-80mg  PO QD (or other "high intensity" statin therapy). Complete cessation from all tobacco products. Blood glucose control with goal A1c < 7%. Blood pressure control with goal blood pressure < 140/90 mmHg. Lipid reduction therapy with goal LDL-C <100 mg/dL (<16 if symptomatic from PAD).    Victorino Sparrow, MD [facility] [phone]

## 2021-08-27 ENCOUNTER — Ambulatory Visit (INDEPENDENT_AMBULATORY_CARE_PROVIDER_SITE_OTHER): Payer: Managed Care, Other (non HMO) | Admitting: Vascular Surgery

## 2021-08-27 ENCOUNTER — Encounter: Payer: Self-pay | Admitting: Vascular Surgery

## 2021-08-27 ENCOUNTER — Other Ambulatory Visit: Payer: Self-pay

## 2021-08-27 ENCOUNTER — Ambulatory Visit (HOSPITAL_COMMUNITY)
Admission: RE | Admit: 2021-08-27 | Discharge: 2021-08-27 | Disposition: A | Discharge status: Home / Self Care | Payer: Managed Care, Other (non HMO) | Source: Ambulatory Visit | Attending: Vascular Surgery | Admitting: Vascular Surgery

## 2021-08-27 ENCOUNTER — Ambulatory Visit (INDEPENDENT_AMBULATORY_CARE_PROVIDER_SITE_OTHER)
Admission: RE | Admit: 2021-08-27 | Discharge: 2021-08-27 | Disposition: A | Discharge status: Home / Self Care | Payer: Managed Care, Other (non HMO) | Source: Ambulatory Visit | Attending: Vascular Surgery | Admitting: Vascular Surgery

## 2021-08-27 VITALS — BP 148/75 | HR 78 | Temp 98.5°F | Resp 20 | Ht 70.0 in | Wt 212.0 lb

## 2021-08-27 DIAGNOSIS — I739 Peripheral vascular disease, unspecified: Secondary | ICD-10-CM

## 2021-08-31 ENCOUNTER — Other Ambulatory Visit: Payer: Self-pay

## 2021-08-31 DIAGNOSIS — I739 Peripheral vascular disease, unspecified: Secondary | ICD-10-CM

## 2021-09-01 ENCOUNTER — Other Ambulatory Visit: Payer: Self-pay | Admitting: Family Medicine

## 2021-09-06 ENCOUNTER — Other Ambulatory Visit: Payer: Self-pay | Admitting: Family Medicine

## 2021-09-17 ENCOUNTER — Other Ambulatory Visit: Payer: Self-pay | Admitting: Family Medicine

## 2021-09-21 ENCOUNTER — Telehealth: Payer: Self-pay | Admitting: Physical Medicine and Rehabilitation

## 2021-09-21 ENCOUNTER — Other Ambulatory Visit: Payer: Self-pay | Admitting: Physical Medicine and Rehabilitation

## 2021-09-21 DIAGNOSIS — M542 Cervicalgia: Secondary | ICD-10-CM

## 2021-09-21 MED ORDER — TRAMADOL HCL 50 MG PO TABS: 50.0000 mg | ORAL_TABLET | Freq: Three times a day (TID) | ORAL | 0 refills | Status: DC | PRN

## 2021-09-21 NOTE — Telephone Encounter (Signed)
Pt's wife Rinaldo Cloud called stating that her husband neck is in a lot of pain. Please call pt at [phone].

## 2021-09-21 NOTE — Telephone Encounter (Signed)
Spoke with patient on the phone this afternoon. Patient states his pain has been increasing over the past several weeks, denies recent injury/trauma. Patient states pain to bilateral neck, feels similar as before. Patient had left C3-C4, C4-C5 and C5-C6 cervical facet joint blocks in May of 2022 and reports 100% relief of pain until recently. Patient is requesting facet joint injections at this time. I did offer patient pain medication to take until we can get him in for injections. Prescription for Tramadol placed today. I will place an order for bilateral C4-C5 and C5-C6 facet joint injections. We will call patient to schedule injection. I did discuss this patients case with Dr. Alvester Morin and he is agreeable to plan of care.

## 2021-10-11 ENCOUNTER — Ambulatory Visit: Payer: Self-pay

## 2021-10-11 ENCOUNTER — Encounter: Payer: Self-pay | Admitting: Physical Medicine and Rehabilitation

## 2021-10-11 ENCOUNTER — Ambulatory Visit (INDEPENDENT_AMBULATORY_CARE_PROVIDER_SITE_OTHER): Payer: Managed Care, Other (non HMO) | Admitting: Physical Medicine and Rehabilitation

## 2021-10-11 ENCOUNTER — Other Ambulatory Visit: Payer: Self-pay

## 2021-10-11 VITALS — BP 145/75 | HR 80

## 2021-10-11 DIAGNOSIS — M7918 Myalgia, other site: Secondary | ICD-10-CM | POA: Diagnosis not present

## 2021-10-11 DIAGNOSIS — M79642 Pain in left hand: Secondary | ICD-10-CM | POA: Diagnosis not present

## 2021-10-11 DIAGNOSIS — M47812 Spondylosis without myelopathy or radiculopathy, cervical region: Secondary | ICD-10-CM

## 2021-10-11 DIAGNOSIS — M542 Cervicalgia: Secondary | ICD-10-CM | POA: Diagnosis not present

## 2021-10-11 MED ORDER — METHYLPREDNISOLONE ACETATE 80 MG/ML IJ SUSP: 80.0000 mg | Freq: Once | INTRAMUSCULAR | Status: DC

## 2021-10-11 NOTE — Progress Notes (Signed)
Pt state neck pain. Pt state turned his head and laying down at night makes the pain worse. Pt state he takes pain meds to help ease his pain.  Numeric Pain Rating Scale and Functional Assessment Average Pain 7   In the last MONTH (on 0-10 scale) has pain interfered with the following?  1. General activity like being  able to carry out your everyday physical activities such as walking, climbing stairs, carrying groceries, or moving a chair?  Rating(9)   +Driver, -BT, -Dye Allergies.

## 2021-10-11 NOTE — Patient Instructions (Signed)

## 2021-10-13 NOTE — Progress Notes (Signed)
[name] - 56 y.o. male MRN [id number]  Date of birth: 02-19-1966  Office Visit Note: Visit Date: 10/11/2021 PCP: [name], MD Referred by: [name], MD  Subjective: Chief Complaint  Patient presents with   Neck - Pain   HPI: Jason [name] is a 56 y.o. male who comes in today for evaluation and management or 2 separate issues.  The first is chronic severe neck pain.  We have seen him in the past for this on numerous occasions.  He has had trigger point injections and physical therapy as well as medication management as well as cervical epidurals in the past with some modicum of relief temporarily.  The last time we saw him we completed facet joint blocks which temporarily did give him quite a bit of relief diagnostically and he felt like those were really what helped him the most.  His symptoms in the cervical spine are in the mid to upper cervical spine some radiating symptoms up into the occiput which seem to be myofascial.  He does have myofascial pain and trigger points along the trapezius and rhomboids.  He denies any symptoms of paresthesias or radicular symptoms down the arms at this point.  He does report worsening symptoms with extension and rotation left more than right.  X-ray and MRI findings show significant arthritis left more than right of the mid to upper cervical spine in the C3-4 and C4-5 regions.  He does get associated occipital type headache.  He has had no other trauma or red flag complaints.  His case is complicated by significant peripheral artery disease status post stenting in the lower extremities and bypass.  He has poorly controlled type 2 diabetes.  He has had a history of hip replacement with avascular necrosis.  His second biggest complaint today is hand pain particularly on the left.  He does a physically active job and he reports a lot of aching pain in the left hand with possibly paresthesia but mainly just pain with movement and this  has been progressively getting worse recently.  He was asking about having this evaluated.  He has had no specific trauma.  He reports a long time ago when he was in the [employer] he did have injury to the hand with some type of fracture that they did contemplate surgery at the time but it did seem to eventually heal.     Review of Systems  Musculoskeletal:  Positive for back pain, joint pain and neck pain.  All other systems reviewed and are negative. Otherwise per HPI.  Assessment & Plan: Visit Diagnoses:    ICD-10-CM   1. Cervical spondylosis without myelopathy  M47.812 XR C-ARM NO REPORT    Facet Injection    methylPREDNISolone acetate (DEPO-MEDROL) injection 80 mg    2. Cervicalgia  M54.2     3. Myofascial pain syndrome  M79.18     4. Pain in left hand  M79.642        Plan: Findings:  1.  Chronic severe recalcitrant axial neck pain worse with extension and rotation consistent with facet joint mediated pain.  Prior facet joint blocks were significantly diagnostic.  We are going to complete a second diagnostic facet joint block at C3-4 and C4-5 bilaterally.  If he gets good relief for that we could contemplate radiofrequency ablation.  He has had prior epidural injections without as much relief.  He has had prior physical therapy with exercises.  He has not had  recent dry needling.  He does have some level of myofascial pain.  He continues with exercises and he does a physically active job.  2.  Left more than right hand pain with history of being fairly physically active and the jobs he has had with history of some prior injury to the hand remotely.  I am going to have him see Dr. Marlyne Beardsharlie Benfield in our office for evaluation.   Meds & Orders:  Meds ordered this encounter  Medications   methylPREDNISolone acetate (DEPO-MEDROL) injection 80 mg    Orders Placed This Encounter  Procedures   Facet Injection   XR C-ARM NO REPORT    Follow-up: Return for Review Pain Diary.    Procedures: No procedures performed  Diagnostic Cervical Facet Joint Nerve Block with Fluoroscopic Guidance  Patient: Tobin Chaddriaan J Dearcos      Date of Birth: 07-19-1966 MRN: [id number] PCP: Kristian CoveyBurchette, Bruce W, MD      Visit Date: 10/11/2021   Universal Protocol:    Date/Time: 02/15/235:59 AM  Consent Given By: the patient  Position: PRONE  Additional Comments: Vital signs were monitored before and after the procedure. Patient was prepped and draped in the usual sterile fashion. The correct patient, procedure, and site was verified.   Injection Procedure Details:   Procedure diagnoses: Cervical spondylosis without myelopathy [M47.812]   Meds Administered:  Meds ordered this encounter  Medications   methylPREDNISolone acetate (DEPO-MEDROL) injection 80 mg     Laterality: Bilateral  Location/Site:  C3-4 C4-5  Needle size: 25 G  Needle type: Spinal  Needle Placement: Articular Pillar  Findings:  -Contrast Used: 0.5 mL iohexol 180 mg iodine/mL   -Comments: Excellent flow of contrast across the articular pillars without intravascular flow  Procedure Details: The fluoroscope beam was positioned to square off the endplates of the desired vertebral level to achieve a true AP position. The beam was then moved in a small counter oblique to the contralateral side with a small amount of caudal tilt to achieve a trajectory alignment with the desired nerves.  For each target described below the skin was anesthetized with 1 ml of 1% Lidocaine without epinephrine.   To block the facet joint nerve to C2, the needle was fluoroscopically positioned over the inferior lateral portion of the C2/3 facet joint nerve where the third occipital nerve (TON) lies.  To block the facet joint nerves from C3 through C7, the lateral masses of these respective levels were localized under fluoroscopic visualization.  A spinal needle was inserted down to the "waist" at the above mentioned cervical  levels.  The  needle was then "walked off" until it rested just lateral to the trough of the lateral mass of the medial branch nerve, which innervates the cervical facet joint.  To block the C8 facet joint nerve, the needle was fluoroscopically introduced onto the Tl transverse process at its most medial superior end.  After contact with periosteum and negative aspirate for blood and CSF, correct placement without intravascular or epidural spread was confirmed by Bi-planar images and  injecting 0.5 ml. of Omnipaque-240.  A spot radiograph was obtained of this image.  Next, a 0.5 ml. volume of 1% Lidocaine without Epinephrine was then injected.  Prior to the procedure, the patient was given a Pain Diary which was completed for baseline measurements.  After the procedure, the patient rated their pain every 30 minutes and will continue rating at this frequency for a total of 5 hours.  The patient has been  asked to complete the Diary and return to us by mail, fax or hand delivered as soon as possible.   Additional Comments:  The patient tolerated the procedure well Dressing: Band-Aid    Post-procedure details: Patient was observed during the procedure. Post-procedure instructions were reviewed.  Patient left the clinic in stable condition.       Clinical History: Cervical MRI dated 11/23/2020 ordered by Venita Lickahari Brooks, MD  Impression: 1.  Severe left and mild to moderate right C3-4 foraminal narrowing  2.  Severe left and moderate severe right C4-5 foraminal narrowing  3. Severe left and moderate right C5-6 foraminal narrowing  4.  Severe bilateral C6-7 foraminal narrowing.  5.  Moderate severe right C2 foraminal narrowing.  6.  Mild left T1-2 foraminal narrowing.  7.  Mild dextroconvex cervical scoliosis apex centered at C6   8.  Severe left C3-4 facet arthrosis. ------   09/15/2017 Electrodiagnostic Study - Naaman PlummerFred Shyah Cadmus, MD Impression: Essentially NORMAL electrodiagnostic  study of the left upper limb. There is no significant electrodiagnostic evidence of nerve entrapment, brachial plexopathy or cervical radiculopathy. **The very borderline reduced amplitudes are likely artifact.   As you know, purely sensory or demyelinating radiculopathies and chemical radiculitis may not be detected with this particular electrodiagnostic study.   Recommendations: 1. Follow-up with referring physician. 2. Continue current management of symptoms. Repeat cervical epidural x1. Reevaluate intrinsic left shoulder pathology.   He reports that he has been smoking cigarettes. He has a 50.00 pack-year smoking history. He has never used smokeless tobacco.  Recent Labs    06/16/21 0746  HGBA1C 8.4*    Objective:  VS:  HT:     WT:    BMI:      BP: (!) 145/75   HR:80bpm   TEMP: ( )   RESP:  Physical Exam Vitals and nursing note reviewed.  Constitutional:      General: He is not in acute distress.    Appearance: Normal appearance. He is not ill-appearing.  HENT:     Head: Normocephalic and atraumatic.     Right Ear: External ear normal.     Left Ear: External ear normal.  Eyes:     Extraocular Movements: Extraocular movements intact.  Cardiovascular:     Rate and Rhythm: Normal rate.     Pulses: Normal pulses.  Abdominal:     General: There is no distension.     Palpations: Abdomen is soft.  Musculoskeletal:        General: No signs of injury.     Cervical back: Neck supple. Tenderness present. No rigidity.     Right lower leg: No edema.     Left lower leg: No edema.     Comments: Patient has good strength in the upper extremities with 5 out of 5 strength in wrist extension long finger flexion APB.  No intrinsic hand muscle atrophy.  Negative Hoffmann's test.  He has concordant pain with extension and rotation of the cervical spine.  There are active trigger points in the trapezius and levator scapula bilaterally.  He has some level of shoulder impingement bilaterally.   Examination of the hands shows osteoarthritic changes with some pain over the [facility] joint of the left hand.  Negative Finkelstein's.  Negative Phalen's.  Lymphadenopathy:     Cervical: No cervical adenopathy.  Skin:    Findings: No erythema or rash.  Neurological:     General: No focal deficit present.     Mental Status: He is alert  and oriented to person, place, and time.     Sensory: No sensory deficit.     Motor: No weakness or abnormal muscle tone.     Coordination: Coordination normal.  Psychiatric:        Mood and Affect: Mood normal.        Behavior: Behavior normal.    Ortho Exam  Imaging: No results found.  Past Medical/Family/Surgical/Social History: Medications & Allergies reviewed per EMR, new medications updated. 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) 03/10/2020   Primary osteoarthritis of right hip 07/12/2019   Avascular necrosis (HCC) 06/15/2019   PAD (peripheral artery disease) (HCC) 12/11/2017   Preoperative cardiovascular examination 12/05/2017   Peripheral arterial disease (HCC) 10/23/2017   History of MRSA infection 10/23/2017   Intermittent claudication (HCC) 10/17/2017   Herniated cervical intervertebral disc 08/01/2017   Osteoarthritis of spine 08/01/2017   Poorly controlled type 2 diabetes mellitus with circulatory disorder (HCC) 07/11/2017   Polyarthralgia 06/27/2017   Cervical pain (neck) 06/27/2017   Primary osteoarthritis of both knees 05/02/2017   Sensory neuronopathy 01/14/2017   Transaminasemia 10/10/2016   Vitamin B 12 deficiency 09/19/2016   Hyperglycemia 09/19/2016   Chest pressure 07/04/2012   Cough 07/04/2012   Hyperlipidemia 10/12/2010   ANXIETY 10/12/2010   History of alcohol abuse 10/12/2010   TOBACCO USE 10/12/2010   DEPRESSION 10/12/2010   Essential hypertension, benign 10/12/2010   GERD 10/12/2010    Osteoarthritis 10/12/2010   Sleep apnea 10/12/2010   Past Medical History:  Diagnosis Date   Anxiety    Depression    Diabetes mellitus without complication (HCC)    GERD (gastroesophageal reflux disease)    Headache(784.0)    Hyperlipidemia    Hypertension    MRSA infection    Left shin, back   Nerve damage    BLE   Osteoarthritis    Peripheral vascular disease (HCC)    Pneumonia    Family History  Problem Relation Age of Onset   Heart attack Father 14   Hypertension Father    Arthritis Mother    Asthma Maternal Grandfather    Stroke Paternal Grandfather    Heart disease Other    Mental illness Other    Cancer Neg Hx    Diabetes Neg Hx    Drug abuse Neg Hx    Early death Neg Hx    Hearing loss Neg Hx    Hyperlipidemia Neg Hx    Past Surgical History:  Procedure Laterality Date   ABDOMINAL AORTOGRAM W/LOWER EXTREMITY N/A 12/01/2017   Procedure: ABDOMINAL AORTOGRAM W/LOWER EXTREMITY;  Surgeon: Sherren Kerns, MD;  Location: [facility] INVASIVE CV LAB;  Service: Cardiovascular;  Laterality: N/A;   ABDOMINAL AORTOGRAM W/LOWER EXTREMITY Bilateral 07/21/2021   Procedure: ABDOMINAL AORTOGRAM W/LOWER EXTREMITY;  Surgeon: Victorino Sparrow, MD;  Location: [facility] INVASIVE CV LAB;  Service: Cardiovascular;  Laterality: Bilateral;   FEMORAL-POPLITEAL BYPASS GRAFT Right 12/11/2017   Procedure: BYPASS GRAFT RIGHT FEMORAL-ABOVE KNEE POPLITEAL ARTERY with Non Reversed Greater Saphenous Vein and Angioplasty distal vein Graft.;  Surgeon: Sherren Kerns, MD;  Location: [facility] OR;  Service: Vascular;  Laterality: Right;   PERIPHERAL VASCULAR BALLOON ANGIOPLASTY Right 12/01/2017   Procedure: PERIPHERAL VASCULAR BALLOON ANGIOPLASTY;  Surgeon: Sherren Kerns, MD;  Location: [facility] INVASIVE CV LAB;  Service: Cardiovascular;  Laterality: Right;  superficial femoral, attempted   PERIPHERAL VASCULAR  INTERVENTION Left 12/01/2017   Procedure: PERIPHERAL VASCULAR INTERVENTION;  Surgeon: Sherren Kerns, MD;   Location: [facility] INVASIVE CV LAB;  Service: Cardiovascular;  Laterality: Left;  External iliac   PERIPHERAL VASCULAR INTERVENTION Left 07/21/2021   Procedure: PERIPHERAL VASCULAR INTERVENTION;  Surgeon: Victorino Sparrow, MD;  Location: [facility] INVASIVE CV LAB;  Service: Cardiovascular;  Laterality: Left;   ROTATOR CUFF REPAIR     TONSILLECTOMY     VEIN HARVEST Right 12/11/2017   Procedure: RIGHT GREATER SAPHENOUS VEIN HARVEST;  Surgeon: Sherren Kerns, MD;  Location: [facility] OR;  Service: Vascular;  Laterality: Right;   Social History   Occupational History   Not on file  Tobacco Use   Smoking status: Every Day    Packs/day: 2.00    Years: 25.00    Pack years: 50.00    Types: Cigarettes   Smokeless tobacco: Never   Tobacco comments:    30 cigarettes per day  Vaping Use   Vaping Use: Never used  Substance and Sexual Activity   Alcohol use: Not Currently    Alcohol/week: 20.0 standard drinks    Types: 20 Glasses of wine per week    Comment: not since Jan 2018   Drug use: No   Sexual activity: Not Currently

## 2021-10-13 NOTE — Procedures (Signed)
Diagnostic Cervical Facet Joint Nerve Block with Fluoroscopic Guidance  Patient: Jason [name]      Date of Birth: 05/02/1966 MRN: [id number] PCP: [name], MD      Visit Date: 10/11/2021   Universal Protocol:    Date/Time: 02/15/235:59 AM  Consent Given By: the patient  Position: PRONE  Additional Comments: Vital signs were monitored before and after the procedure. Patient was prepped and draped in the usual sterile fashion. The correct patient, procedure, and site was verified.   Injection Procedure Details:   Procedure diagnoses: Cervical spondylosis without myelopathy [M47.812]   Meds Administered:  Meds ordered this encounter  Medications   methylPREDNISolone acetate (DEPO-MEDROL) injection 80 mg     Laterality: Bilateral  Location/Site:  C3-4 C4-5  Needle size: 25 G  Needle type: Spinal  Needle Placement: Articular Pillar  Findings:  -Contrast Used: 0.5 mL iohexol 180 mg iodine/mL   -Comments: Excellent flow of contrast across the articular pillars without intravascular flow  Procedure Details: The fluoroscope beam was positioned to square off the endplates of the desired vertebral level to achieve a true AP position. The beam was then moved in a small counter oblique to the contralateral side with a small amount of caudal tilt to achieve a trajectory alignment with the desired nerves.  For each target described below the skin was anesthetized with 1 ml of 1% Lidocaine without epinephrine.   To block the facet joint nerve to C2, the needle was fluoroscopically positioned over the inferior lateral portion of the C2/3 facet joint nerve where the third occipital nerve (TON) lies.  To block the facet joint nerves from C3 through C7, the lateral masses of these respective levels were localized under fluoroscopic visualization.  A spinal needle was inserted down to the "waist" at the above mentioned cervical levels.  The  needle was then "walked off"  until it rested just lateral to the trough of the lateral mass of the medial branch nerve, which innervates the cervical facet joint.  To block the C8 facet joint nerve, the needle was fluoroscopically introduced onto the Tl transverse process at its most medial superior end.  After contact with periosteum and negative aspirate for blood and CSF, correct placement without intravascular or epidural spread was confirmed by Bi-planar images and  injecting 0.5 ml. of Omnipaque-240.  A spot radiograph was obtained of this image.  Next, a 0.5 ml. volume of 1% Lidocaine without Epinephrine was then injected.  Prior to the procedure, the patient was given a Pain Diary which was completed for baseline measurements.  After the procedure, the patient rated their pain every 30 minutes and will continue rating at this frequency for a total of 5 hours.  The patient has been asked to complete the Diary and return to [place] by mail, fax or hand delivered as soon as possible.   Additional Comments:  The patient tolerated the procedure well Dressing: Band-Aid    Post-procedure details: Patient was observed during the procedure. Post-procedure instructions were reviewed.  Patient left the clinic in stable condition.

## 2021-10-14 ENCOUNTER — Ambulatory Visit: Payer: Managed Care, Other (non HMO) | Admitting: Orthopedic Surgery

## 2021-10-14 ENCOUNTER — Other Ambulatory Visit: Payer: Self-pay

## 2021-10-14 ENCOUNTER — Ambulatory Visit (INDEPENDENT_AMBULATORY_CARE_PROVIDER_SITE_OTHER): Payer: Managed Care, Other (non HMO)

## 2021-10-14 ENCOUNTER — Encounter: Payer: Self-pay | Admitting: Orthopedic Surgery

## 2021-10-14 VITALS — BP 137/77 | HR 76

## 2021-10-14 DIAGNOSIS — M25532 Pain in left wrist: Secondary | ICD-10-CM

## 2021-10-14 DIAGNOSIS — M7702 Medial epicondylitis, left elbow: Secondary | ICD-10-CM

## 2021-10-14 DIAGNOSIS — M1812 Unilateral primary osteoarthritis of first carpometacarpal joint, left hand: Secondary | ICD-10-CM

## 2021-10-14 DIAGNOSIS — M25522 Pain in left elbow: Secondary | ICD-10-CM

## 2021-10-14 DIAGNOSIS — M189 Osteoarthritis of first carpometacarpal joint, unspecified: Secondary | ICD-10-CM | POA: Insufficient documentation

## 2021-10-14 MED ORDER — BETAMETHASONE SOD PHOS & ACET 6 (3-3) MG/ML IJ SUSP
3.0000 mg | INTRAMUSCULAR | Status: AC | PRN
  Administered 2021-10-14: 3 mg via INTRA_ARTICULAR

## 2021-10-14 MED ORDER — LIDOCAINE HCL 1 % IJ SOLN
0.5000 mL | INTRAMUSCULAR | Status: AC | PRN
  Administered 2021-10-14: .5 mL

## 2021-10-14 NOTE — Progress Notes (Signed)
Office Visit Note   Patient: Jason [name]           Date of Birth: August 07, 1966           MRN: [id number] Visit Date: 10/14/2021              Requested by: [name], MD [address] [place],  [place] [address] PCP: [name], MD   Assessment & Plan: Visit Diagnoses:  1. Pain in left elbow   2. Pain in left wrist     Plan: We discussed the diagnosis, prognosis, non-operative and operative treatment options for both CMC osteoarthritis and medial epicondylitis.  After our discussion, the patient would like to proceed with bracing and corticosteroid injection for his CMC arthritis and physical therapy for his medial epicondylitis.  We reviewed the risks and benefits of conservative management.  The patient expressed understanding of the reasoning and strategy going forward.  All patient questions and concerns were addressed.    Follow-Up Instructions: No follow-ups on file.   Orders:  Orders Placed This Encounter  Procedures   XR Wrist Complete Left   XR Elbow Complete Left (3+View)   No orders of the defined types were placed in this encounter.     Procedures: Hand/UE Inj: L thumb CMC for osteoarthritis on 10/14/2021 4:40 PM Indications: pain and therapeutic Details: 25 G needle, radial approach Medications: 0.5 mL lidocaine 1 %; 3 mg betamethasone acetate-betamethasone sodium phosphate 6 (3-3) MG/ML Outcome: tolerated well, no immediate complications Procedure, treatment alternatives, risks and benefits explained, specific risks discussed. Consent was given by the patient. Immediately prior to procedure a time out was called to verify the correct patient, procedure, equipment, support staff and site/side marked as required. Patient was prepped and draped in the usual sterile fashion.      Clinical Data: No additional findings.   Subjective: Chief Complaint  Patient presents with   Left Wrist - Follow-up   Left Elbow - Follow-up     This is a 56 year old right-hand-dominant male who works in the [employer] and presents with left thumb CMC pain and left elbow pain.  This seems been going on for several months now.  Notes that he hit his left elbow around Christmas time with continued pain.  The pain is at the medial aspect of the elbow around the just distal to the medial epicondyle.  So the pain is worse with lifting and with driving.  He has not had any treatment so far.  His left thumb pain is localized to the [facility] joint.  There is also been going on for months and is progressively worsening.  He has difficulty with gripping and squeezing activities at work.  He often has to bend large wires and use large hand tools which can be quite difficult for him.  He takes Celebrex for generalized osteoarthritis but otherwise has not had any treatment for these issues.   Review of Systems   Objective: Vital Signs: BP 137/77 (BP Location: Right Arm, Patient Position: Sitting, Cuff Size: Large)    Pulse 76    SpO2 95%   Physical Exam Constitutional:      Appearance: Normal appearance.  Cardiovascular:     Rate and Rhythm: Normal rate.     Pulses: Normal pulses.  Pulmonary:     Effort: Pulmonary effort is normal.  Skin:    General: Skin is warm and dry.     Capillary Refill: Capillary refill takes less than  2 seconds.  Neurological:     Mental Status: He is alert.    Left Hand Exam   Tenderness  Left hand tenderness location: TTP at [facility] joint.   Other  Erythema: absent Sensation: normal Pulse: present  Comments:  + CMC grind test w/ pain and crepitus.  No passive or dynamic MP hyperextension and no pain at the MP joint.     Left Elbow Exam   Tenderness  The patient is experiencing tenderness in the medial epicondyle.   Range of Motion  The patient has normal left elbow ROM.  Muscle Strength  The patient has normal left elbow strength.  Other  Erythema: absent Sensation: normal Pulse:  present     Specialty Comments:  No specialty comments available.  Imaging: No results found.   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) 03/10/2020   Primary osteoarthritis of right hip 07/12/2019   Avascular necrosis (HCC) 06/15/2019   PAD (peripheral artery disease) (HCC) 12/11/2017   Preoperative cardiovascular examination 12/05/2017   Peripheral arterial disease (HCC) 10/23/2017   History of MRSA infection 10/23/2017   Intermittent claudication (HCC) 10/17/2017   Herniated cervical intervertebral disc 08/01/2017   Osteoarthritis of spine 08/01/2017   Poorly controlled type 2 diabetes mellitus with circulatory disorder (HCC) 07/11/2017   Polyarthralgia 06/27/2017   Cervical pain (neck) 06/27/2017   Primary osteoarthritis of both knees 05/02/2017   Sensory neuronopathy 01/14/2017   Transaminasemia 10/10/2016   Vitamin B 12 deficiency 09/19/2016   Hyperglycemia 09/19/2016   Chest pressure 07/04/2012   Cough 07/04/2012   Hyperlipidemia 10/12/2010   ANXIETY 10/12/2010   History of alcohol abuse 10/12/2010   TOBACCO USE 10/12/2010   DEPRESSION 10/12/2010   Essential hypertension, benign 10/12/2010   GERD 10/12/2010   Osteoarthritis 10/12/2010   Sleep apnea 10/12/2010   Past Medical History:  Diagnosis Date   Anxiety    Depression    Diabetes mellitus without complication (HCC)    GERD (gastroesophageal reflux disease)    Headache(784.0)    Hyperlipidemia    Hypertension    MRSA infection    Left shin, back   Nerve damage    BLE   Osteoarthritis    Peripheral vascular disease (HCC)    Pneumonia     Family History  Problem Relation Age of Onset   Heart attack Father 27   Hypertension Father    Arthritis Mother    Asthma Maternal Grandfather    Stroke Paternal Grandfather    Heart disease Other    Mental  illness Other    Cancer Neg Hx    Diabetes Neg Hx    Drug abuse Neg Hx    Early death Neg Hx    Hearing loss Neg Hx    Hyperlipidemia Neg Hx     Past Surgical History:  Procedure Laterality Date   ABDOMINAL AORTOGRAM W/LOWER EXTREMITY N/A 12/01/2017   Procedure: ABDOMINAL AORTOGRAM W/LOWER EXTREMITY;  Surgeon: Sherren Kerns, MD;  Location: [facility] INVASIVE CV LAB;  Service: Cardiovascular;  Laterality: N/A;   ABDOMINAL AORTOGRAM W/LOWER EXTREMITY Bilateral 07/21/2021   Procedure: ABDOMINAL AORTOGRAM W/LOWER EXTREMITY;  Surgeon: Victorino Sparrow, MD;  Location: [facility] INVASIVE CV LAB;  Service: Cardiovascular;  Laterality: Bilateral;   FEMORAL-POPLITEAL BYPASS GRAFT Right 12/11/2017   Procedure: BYPASS GRAFT RIGHT FEMORAL-ABOVE KNEE POPLITEAL ARTERY with Non Reversed Greater Saphenous Vein  and Angioplasty distal vein Graft.;  Surgeon: Sherren Kerns, MD;  Location: [facility] OR;  Service: Vascular;  Laterality: Right;   PERIPHERAL VASCULAR BALLOON ANGIOPLASTY Right 12/01/2017   Procedure: PERIPHERAL VASCULAR BALLOON ANGIOPLASTY;  Surgeon: Sherren Kerns, MD;  Location: [facility] INVASIVE CV LAB;  Service: Cardiovascular;  Laterality: Right;  superficial femoral, attempted   PERIPHERAL VASCULAR INTERVENTION Left 12/01/2017   Procedure: PERIPHERAL VASCULAR INTERVENTION;  Surgeon: Sherren Kerns, MD;  Location: [facility] INVASIVE CV LAB;  Service: Cardiovascular;  Laterality: Left;  External iliac   PERIPHERAL VASCULAR INTERVENTION Left 07/21/2021   Procedure: PERIPHERAL VASCULAR INTERVENTION;  Surgeon: Victorino Sparrow, MD;  Location: [facility] INVASIVE CV LAB;  Service: Cardiovascular;  Laterality: Left;   ROTATOR CUFF REPAIR     TONSILLECTOMY     VEIN HARVEST Right 12/11/2017   Procedure: RIGHT GREATER SAPHENOUS VEIN HARVEST;  Surgeon: Sherren Kerns, MD;  Location: [facility] OR;  Service: Vascular;  Laterality: Right;   Social History   Occupational History   Not on file  Tobacco Use   Smoking status: Every Day     Packs/day: 2.00    Years: 25.00    Pack years: 50.00    Types: Cigarettes   Smokeless tobacco: Never   Tobacco comments:    30 cigarettes per day  Vaping Use   Vaping Use: Never used  Substance and Sexual Activity   Alcohol use: Not Currently    Alcohol/week: 20.0 standard drinks    Types: 20 Glasses of wine per week    Comment: not since Jan 2018   Drug use: No   Sexual activity: Not Currently

## 2021-10-22 ENCOUNTER — Telehealth: Payer: Self-pay | Admitting: Physical Medicine and Rehabilitation

## 2021-10-22 ENCOUNTER — Other Ambulatory Visit: Payer: Self-pay

## 2021-10-22 ENCOUNTER — Ambulatory Visit: Payer: Managed Care, Other (non HMO) | Admitting: Rehabilitative and Restorative Service Providers"

## 2021-10-22 ENCOUNTER — Encounter: Payer: Self-pay | Admitting: Rehabilitative and Restorative Service Providers"

## 2021-10-22 ENCOUNTER — Other Ambulatory Visit: Payer: Self-pay | Admitting: Physical Medicine and Rehabilitation

## 2021-10-22 DIAGNOSIS — M25522 Pain in left elbow: Secondary | ICD-10-CM

## 2021-10-22 DIAGNOSIS — M25542 Pain in joints of left hand: Secondary | ICD-10-CM

## 2021-10-22 DIAGNOSIS — M6281 Muscle weakness (generalized): Secondary | ICD-10-CM

## 2021-10-22 DIAGNOSIS — M542 Cervicalgia: Secondary | ICD-10-CM

## 2021-10-22 DIAGNOSIS — M7918 Myalgia, other site: Secondary | ICD-10-CM

## 2021-10-22 DIAGNOSIS — G8929 Other chronic pain: Secondary | ICD-10-CM

## 2021-10-22 NOTE — Therapy (Signed)
OUTPATIENT OCCUPATIONAL THERAPY ORTHO EVALUATION  Patient Name: Jason [name] MRN: [id number] DOB:[birth year], 56 y.o., male Today's Date: 10/22/2021  PCP: Eulas Post, MD REFERRING PROVIDER: Sherilyn Cooter, MD   OT End of Session - 10/22/21 1057     Visit Number 1    Number of Visits 12    Authorization Type Signa    Authorization - Number of Visits 40    OT Start Time 1059    OT Stop Time 1203    OT Time Calculation (min) 64 min    Equipment Utilized During Treatment orthotic materials    Activity Tolerance Patient tolerated treatment well    Behavior During Therapy WFL for tasks assessed/performed             Past Medical History:  Diagnosis Date   Anxiety    Depression    Diabetes mellitus without complication (HCC)    GERD (gastroesophageal reflux disease)    Headache(784.0)    Hyperlipidemia    Hypertension    MRSA infection    Left shin, back   Nerve damage    BLE   Neuromuscular disorder ([place])    Osteoarthritis    Peripheral vascular disease ([place])    Pneumonia    Past Surgical History:  Procedure Laterality Date   ABDOMINAL AORTOGRAM W/LOWER EXTREMITY N/A 12/01/2017   Procedure: ABDOMINAL AORTOGRAM W/LOWER EXTREMITY;  Surgeon: Elam Dutch, MD;  Location: [facility] CV LAB;  Service: Cardiovascular;  Laterality: N/A;   ABDOMINAL AORTOGRAM W/LOWER EXTREMITY Bilateral 07/21/2021   Procedure: ABDOMINAL AORTOGRAM W/LOWER EXTREMITY;  Surgeon: Broadus John, MD;  Location: [place] CV LAB;  Service: Cardiovascular;  Laterality: Bilateral;   FEMORAL-POPLITEAL BYPASS GRAFT Right 12/11/2017   Procedure: BYPASS GRAFT RIGHT FEMORAL-ABOVE KNEE POPLITEAL ARTERY with Non Reversed Greater Saphenous Vein and Angioplasty distal vein Graft.;  Surgeon: Elam Dutch, MD;  Location: [facility] OR;  Service: Vascular;  Laterality: Right;   PERIPHERAL VASCULAR BALLOON ANGIOPLASTY Right 12/01/2017   Procedure: PERIPHERAL VASCULAR BALLOON ANGIOPLASTY;  Surgeon:  Elam Dutch, MD;  Location: [place] CV LAB;  Service: Cardiovascular;  Laterality: Right;  superficial femoral, attempted   PERIPHERAL VASCULAR INTERVENTION Left 12/01/2017   Procedure: PERIPHERAL VASCULAR INTERVENTION;  Surgeon: Elam Dutch, MD;  Location: [place] CV LAB;  Service: Cardiovascular;  Laterality: Left;  External iliac   PERIPHERAL VASCULAR INTERVENTION Left 07/21/2021   Procedure: PERIPHERAL VASCULAR INTERVENTION;  Surgeon: Broadus John, MD;  Location: [place] CV LAB;  Service: Cardiovascular;  Laterality: Left;   ROTATOR CUFF REPAIR     TONSILLECTOMY     VEIN HARVEST Right 12/11/2017   Procedure: RIGHT GREATER SAPHENOUS VEIN HARVEST;  Surgeon: Elam Dutch, MD;  Location: [facility] OR;  Service: Vascular;  Laterality: Right;   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 ([place]) 03/10/2020   Primary osteoarthritis of right hip 07/12/2019   Avascular necrosis ([place]) 06/15/2019   PAD (peripheral artery disease) ([place]) 12/11/2017   Preoperative cardiovascular examination 12/05/2017   Peripheral arterial disease ([place]) 10/23/2017   History of MRSA infection 10/23/2017   Intermittent claudication ([place]) 10/17/2017   Herniated cervical intervertebral disc 08/01/2017   Osteoarthritis of spine 08/01/2017   Poorly controlled type 2 diabetes mellitus with circulatory disorder ([place]) 07/11/2017   Polyarthralgia  06/27/2017   Cervical pain (neck) 06/27/2017   Primary osteoarthritis of both knees 05/02/2017   Sensory neuronopathy 01/14/2017   Transaminasemia 10/10/2016   Vitamin B 12 deficiency 09/19/2016   Hyperglycemia 09/19/2016   Chest pressure 07/04/2012   Cough 07/04/2012   Hyperlipidemia 10/12/2010   ANXIETY 10/12/2010   History of alcohol  abuse 10/12/2010   TOBACCO USE 10/12/2010   DEPRESSION 10/12/2010   Essential hypertension, benign 10/12/2010   GERD 10/12/2010   Osteoarthritis 10/12/2010   Sleep apnea 10/12/2010    ONSET DATE: chronic (3-4 months)   REFERRING DIAG:  M18.12 (ICD-10-CM) - Primary osteoarthritis of first carpometacarpal joint of left hand  M77.02 (ICD-10-CM) - Medial epicondylitis of elbow, left    THERAPY DIAG:  Pain in left elbow  Pain in joint of left hand  Muscle weakness (generalized)  SUBJECTIVE:   He states bumping arm hard around Christmas and now is has gradually increased in pain.  Years (20+) ago he broke left wrist "badly." Thumb has been hurting 6 months or more, insidious onset. He also discusses chronic neck OA and neuropathy, DM II and neuropathy, even feeling pain in rt hand MP Js & swelling from OA. Discusses past history of ETOH dependence as well.   PERTINENT HISTORY:  from MD: "Conservative management of left medial epicondylitis and left thumb CMC osteoarthritis"  PAIN:  Are you having pain? No NPRS scale: 0/10 at rest up to 5/85 with certain motions Pain location: thumb and elbow Pain orientation: Left  PAIN TYPE: dull, sharp, and throbbing Pain description: intermittent  Aggravating factors: over use, gripping, pulling  Relieving factors: he has tried advil and tylenol to no avail  PRECAUTIONS: None  HAND DOMINANCE: Right  WEIGHT BEARING RESTRICTIONS No  FALLS: Has patient fallen in last 6 months? No, Number of falls: 0  PLOF: Independent  PATIENT GOALS  To use left hand and arm with no significant pain do lift/carry, open containers and also do work tasks.   OBJECTIVE:   DIAGNOSTIC FINDINGS: per plain films 10/14/21: "3 views left elbow taken today reviewed interpreted by me.  They  demonstrate maintained radiocapitellar and ulnohumeral joint spaces.  There is no significant osteophytes present.  No evidence of instability or bony injury." "Multiple views  left wrist taken today reviewed interpreted by me.  Demonstrate [place] stage II/III osteoarthritis involving the thumb CMC joint.  There is an ossific fragment in the tissue superficial to the [facility] joint.  There are some mild radiocarpal degenerative changes present as well."  ADLs: Overall ADLs: States pain/problems opening containers, using utensils, carrying objects, etc.  FUNCTIONAL OUTCOME MEASURES: Quick Dash 36% today   SENSATION: He does c/o of ulnar sided numbness at night at times.  Light touch: Appears intact Stereognosis: Appears intact Hot/Cold: Appears intact Proprioception: Appears intact   COORDINATION: 9 Hole Peg test: Right:  sec; Left:  sec Box and Blocks: Comments: TBD  EDEMA: none significant at elbow, mild swelling about lt thumb CMC and MP J as well as b/l hands MP Js swollen, red mildly TTP   MUSCLE TONE: WNL  SKIN INTEGRITY: scratches on lt FA from work- he states not knowing how he got them   PALPATION: slight TTP lt thumb MP J as well as lt superior medial epicondyle & common flexor muscle bellies in lt arm.  Neg tinnels at elbow and negative elbow flexion tests today   UE AROM/PROM:  A/PROM Right 10/22/2021 Left 10/22/2021  Elbow flexion    Elbow extension  Wrist flexion 56 56  Wrist extension 65 71  Wrist ulnar deviation    Wrist radial deviation    Wrist pronation    Wrist supination    (Blank rows = not tested)  HAND A/PROM: makes full fist b/l hands, feels tightness, specifics TBD  A/PROM Right 10/22/2021 Left 10/22/2021  Thumb MCP (0-60)    Thumb IP (0-80)    Thumb Radial abd/add (0-55)    Thumb Palmar abd/add (0-45)    Thumb opposition to index    (Blank rows = not tested)  UE MMT: TBD   MMT Right 10/22/2021 Left 10/22/2021  Elbow flexion    Elbow extension    Wrist flexion    Wrist extension    Wrist ulnar deviation    Wrist radial deviation    Wrist pronation    Wrist supination    (Blank rows = not tested)  HAND  FUNCTION:  Grip strength: Right: 70 lbs; Left: 71 lbs 3 point pinch: Right:  lbs, Left:  lbs Tip pinch: Right  lbs, Left:  lbs Comments: grip grossly intact, pinches may need measured in upcoming sessions if pain continues   TODAY'S TREATMENT:  10/22/21 Eval: OT educates pt briefly on self-care habits related to sleep and habits, to stop repetitive or poor held postures. Due to pain and OA in thumb, OT fabricated custom lt hand short opponens orthotic for pt today to immobilize lt thumb in fnl position. It fit well with no areas of pressure, pt states a comfortable fit. Pt was educated on the wearing schedule, to call or come in ASAP if it is causing any irritation or is not achieving desired function. It will be checked/adjusted in upcoming sessions, as needed. Pt states understanding. Additionally, OT edu pt on initial HEP for light thumb stretches in [facility] flex, abd and traction as well as elbow/wrist ext A/ROM and P/ROM.  He tolerates well with mild pain increase- coached to do non-painfully, as possible.   PATIENT EDUCATION: Education details: see tx section above for details  Person educated: Patient Education method: Explanation, Demonstration, and Verbal cues Education comprehension: verbalized understanding, returned demonstration, and needs further education   HOME EXERCISE PROGRAM: See tx section above for details   ASSESSMENT:  CLINICAL IMPRESSION: Patient is a 56 y.o. male who was seen today for occupational therapy evaluation and treatment for Lt thumb and elbow pain, weakness and stiffness causing decreased functional ability. Lt thumb CMC OA and Lt medial epicondylitis and possibly ulnar nerve compression.   PERFORMANCE DEFICITS in functional skills including ADLs, IADLs, coordination, dexterity, edema, ROM, strength, pain, fascial restrictions, flexibility, FMC, GMC, body mechanics, endurance, decreased knowledge of precautions, and UE functional use, cognitive skills including  safety awareness, and psychosocial skills including environmental adaptation, habits, and routines and behaviors.   These impairments are limiting patient from ADLs, IADLs, rest and sleep, work, play, and leisure.   COMORBIDITIES has co-morbidities such as OA, DM II, neuropathy, cervical pain/issues, and more  that affects occupational performance. Patient will benefit from skilled OT to address above impairments and improve overall function.  MODIFICATION OR ASSISTANCE TO COMPLETE EVALUATION: Min-Moderate modification of tasks or assist with assess necessary to complete an evaluation.  OT OCCUPATIONAL PROFILE AND HISTORY: Comprehensive assessment: Review of records and extensive additional review of physical, cognitive, psychosocial history related to current functional performance.  CLINICAL DECISION MAKING: High - multiple treatment options, significant modification of task necessary  REHAB POTENTIAL: Good  EVALUATION COMPLEXITY: Moderate  GOALS: Goals reviewed with patient? Yes  SHORT TERM GOALS: (STG required if POC>30 days)   STG Name Target Date Goal status  1 Pt will obtain protective, custom orthotic.  10/22/21 MET  2 Pt will demo/state understanding of initial HEP to improve pain levels and prerequisite motion. 11/05/21 INITIAL    LONG TERM GOALS:    LTG Name Target Date Goal status  1 Pt will improve functional ability by decreased impairment per Quick DASH assessment from 36% to 10% or better, for better quality of life.  12/03/21 INITIAL  3 Pt will improve A/ROM in b/l wrist flexion from 56* to at least 65*, to have functional motion and less tightness in hand and elbow.  12/03/21 INITIAL  4 Pt will improve strength in Lt elbow from at least 3/5 MMT today MMT to at least 4+/5 MMT to have increased functional ability to carry out selfcare and higher-level homecare tasks with no difficulty.  12/03/21 INITIAL  5 Pt will decrease pain at worst from 8/10 to 3/10 or better to have  better sleep and occupational participation in daily roles.  12/03/21 INITIAL      PLAN: OT FREQUENCY: 2x/week (may be less often if time is needed for healing process)  OT DURATION: 6 weeks  PLANNED INTERVENTIONS: self care/ADL training, therapeutic exercise, therapeutic activity, neuromuscular re-education, manual therapy, passive range of motion, splinting, ultrasound, fluidotherapy, compression bandaging, moist heat, cryotherapy, contrast bath, patient/family education, energy conservation, coping strategies training, and DME and/or AE instructions  PLAN FOR NEXT SESSION: take thumb and elbow specific measures, review HEP and check orthotic. Add to HEP P/ROM as tol and light PRE when pain is very limited.   RECOMMENDED OTHER SERVICES: Pt has referral to PT for neck issues and dry needling modality   CONSULTED AND AGREED WITH PLAN OF CARE: Patient   Benito Mccreedy, OTR/L, CHT 10/22/2021, 12:29 PM

## 2021-10-22 NOTE — Telephone Encounter (Signed)
Patient would like a referral for dry needling. Please place in epic and he will be scheduled to see the PT.

## 2021-10-23 ENCOUNTER — Other Ambulatory Visit: Payer: Self-pay | Admitting: Family Medicine

## 2021-10-28 ENCOUNTER — Other Ambulatory Visit: Payer: Self-pay | Admitting: Family Medicine

## 2021-10-28 DIAGNOSIS — E7801 Familial hypercholesterolemia: Secondary | ICD-10-CM

## 2021-10-28 DIAGNOSIS — E785 Hyperlipidemia, unspecified: Secondary | ICD-10-CM

## 2021-11-05 ENCOUNTER — Ambulatory Visit: Payer: Managed Care, Other (non HMO) | Admitting: Physical Therapy

## 2021-11-05 ENCOUNTER — Encounter: Payer: Self-pay | Admitting: Physical Therapy

## 2021-11-05 ENCOUNTER — Other Ambulatory Visit: Payer: Self-pay

## 2021-11-05 DIAGNOSIS — M542 Cervicalgia: Secondary | ICD-10-CM | POA: Diagnosis not present

## 2021-11-05 DIAGNOSIS — R293 Abnormal posture: Secondary | ICD-10-CM | POA: Diagnosis not present

## 2021-11-05 DIAGNOSIS — R29898 Other symptoms and signs involving the musculoskeletal system: Secondary | ICD-10-CM | POA: Diagnosis not present

## 2021-11-05 NOTE — Therapy (Signed)
OUTPATIENT PHYSICAL THERAPY CERVICAL EVALUATION   Patient Name: Jason [name] MRN: [id number] DOB:12-Jul-1966, 56 y.o., male Today's Date: 11/05/2021   PT End of Session - 11/05/21 0925     Visit Number 1    Number of Visits 6    Date for PT Re-Evaluation 12/17/21    PT Start Time 0923    PT Stop Time 1003    PT Time Calculation (min) 40 min    Activity Tolerance Patient tolerated treatment well    Behavior During Therapy [facility] for tasks assessed/performed             Past Medical History:  Diagnosis Date   Anxiety    Depression    Diabetes mellitus without complication (HCC)    GERD (gastroesophageal reflux disease)    Headache(784.0)    Hyperlipidemia    Hypertension    MRSA infection    Left shin, back   Nerve damage    BLE   Neuromuscular disorder (HCC)    Osteoarthritis    Peripheral vascular disease (HCC)    Pneumonia    Past Surgical History:  Procedure Laterality Date   ABDOMINAL AORTOGRAM W/LOWER EXTREMITY N/A 12/01/2017   Procedure: ABDOMINAL AORTOGRAM W/LOWER EXTREMITY;  Surgeon: Sherren Kerns, MD;  Location: [facility] INVASIVE CV LAB;  Service: Cardiovascular;  Laterality: N/A;   ABDOMINAL AORTOGRAM W/LOWER EXTREMITY Bilateral 07/21/2021   Procedure: ABDOMINAL AORTOGRAM W/LOWER EXTREMITY;  Surgeon: Victorino Sparrow, MD;  Location: [facility] INVASIVE CV LAB;  Service: Cardiovascular;  Laterality: Bilateral;   FEMORAL-POPLITEAL BYPASS GRAFT Right 12/11/2017   Procedure: BYPASS GRAFT RIGHT FEMORAL-ABOVE KNEE POPLITEAL ARTERY with Non Reversed Greater Saphenous Vein and Angioplasty distal vein Graft.;  Surgeon: Sherren Kerns, MD;  Location: [facility] OR;  Service: Vascular;  Laterality: Right;   PERIPHERAL VASCULAR BALLOON ANGIOPLASTY Right 12/01/2017   Procedure: PERIPHERAL VASCULAR BALLOON ANGIOPLASTY;  Surgeon: Sherren Kerns, MD;  Location: [facility] INVASIVE CV LAB;  Service: Cardiovascular;  Laterality: Right;  superficial femoral, attempted   PERIPHERAL VASCULAR  INTERVENTION Left 12/01/2017   Procedure: PERIPHERAL VASCULAR INTERVENTION;  Surgeon: Sherren Kerns, MD;  Location: [facility] INVASIVE CV LAB;  Service: Cardiovascular;  Laterality: Left;  External iliac   PERIPHERAL VASCULAR INTERVENTION Left 07/21/2021   Procedure: PERIPHERAL VASCULAR INTERVENTION;  Surgeon: Victorino Sparrow, MD;  Location: [facility] INVASIVE CV LAB;  Service: Cardiovascular;  Laterality: Left;   ROTATOR CUFF REPAIR     TONSILLECTOMY     VEIN HARVEST Right 12/11/2017   Procedure: RIGHT GREATER SAPHENOUS VEIN HARVEST;  Surgeon: Sherren Kerns, MD;  Location: [facility] OR;  Service: Vascular;  Laterality: Right;   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) 03/10/2020   Primary osteoarthritis of right hip 07/12/2019   Avascular necrosis (HCC) 06/15/2019   PAD (peripheral artery disease) (HCC) 12/11/2017   Preoperative cardiovascular examination 12/05/2017   Peripheral arterial disease (HCC) 10/23/2017   History of MRSA infection 10/23/2017   Intermittent claudication (HCC) 10/17/2017   Herniated cervical intervertebral disc 08/01/2017   Osteoarthritis of spine 08/01/2017   Poorly controlled type 2 diabetes mellitus with circulatory disorder (HCC) 07/11/2017   Polyarthralgia 06/27/2017   Cervical pain (neck) 06/27/2017   Primary osteoarthritis of both knees 05/02/2017   Sensory neuronopathy 01/14/2017   Transaminasemia 10/10/2016  Vitamin B 12 deficiency 09/19/2016   Hyperglycemia 09/19/2016   Chest pressure 07/04/2012   Cough 07/04/2012   Hyperlipidemia 10/12/2010   ANXIETY 10/12/2010   History of alcohol abuse 10/12/2010   TOBACCO USE 10/12/2010   DEPRESSION 10/12/2010   Essential hypertension, benign 10/12/2010   GERD 10/12/2010   Osteoarthritis  10/12/2010   Sleep apnea 10/12/2010    PCP: Eulas Post, MD  REFERRING PROVIDER: Magnus Sinning, MD  REFERRING DIAG: M79.18 (ICD-10-CM) - Myofascial pain syndrome M54.6,G89.29 (ICD-10-CM) - Chronic bilateral thoracic back pain M54.2 (ICD-10-CM) - Cervicalgia   THERAPY DIAG:  Cervicalgia - Plan: PT plan of care cert/re-cert  Abnormal posture - Plan: PT plan of care cert/re-cert  Other symptoms and signs involving the musculoskeletal system - Plan: PT plan of care cert/re-cert  ONSET DATE: chronic x 3-4 years  SUBJECTIVE:                                                                                                                                                                                                         SUBJECTIVE STATEMENT: Pt is a 56 y/o male who presents to OPPT for chronic neck pain.  He reports episodes of pain has increased recently.  He had recent injection which he feels has been somewhat helpful.  PERTINENT HISTORY:  Anxiety, depression, DM, HTN, OA, PVD, hx PNA  PAIN:  Are you having pain? Yes NPRS scale: 2/10, up to 8-9/10; at best 2/10 Pain location: neck Pain orientation: Right and Left  PAIN TYPE: chronic Pain description: sharp and shooting   Aggravating factors: turning head, all neck movements Relieving factors: avoiding neck movement; hot shower/stretches  PRECAUTIONS: None  WEIGHT BEARING RESTRICTIONS No  FALLS:  Has patient fallen in last 6 months? 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 Narrowing of the ventral subarachnoid space but no compression of the cord. Bilateral foraminal stenosis could compress either or both C7 nerves. Left-sided predominant spondylosis and facet arthropathy at C3-4, C4-5 and C5-6 with left-sided foraminal narrowing at those levels that could possibly affect the C4, C5 and C6 nerves on the left.  X-ray and MRI findings show significant arthritis  left more than right of the mid to upper cervical spine in the C3-4 and C4-5 regions.  PATIENT SURVEYS:  11/05/21 FOTO 51 (predicted 55)   COGNITION: Overall cognitive status: Within functional limits for tasks assessed   SENSATION: Light touch: Appears intact  POSTURE:  Rounded shoulders, forward head  PALPATION: 11/05/21:    CERVICAL AROM/PROM Pain with all motions  A/PROM A/PROM (deg) 11/05/2021  Flexion 40  Extension 20  Right lateral flexion 20  Left lateral flexion 22  Right rotation 59  Left rotation 49   (Blank rows = not tested)  UE AROM/PROM: WNL  UE MMT:  MMT Right 11/05/2021 Left 11/05/2021  Shoulder flexion 4/5 4/5  Shoulder extension    Shoulder abduction 4/5 4/5  Shoulder adduction    Shoulder extension    Shoulder internal rotation 5/5 5/5  Shoulder external rotation 5/5 5/5   (Blank rows = not tested)  CERVICAL SPECIAL TESTS:  Spurling's test: Negative and Distraction test: Negative    TODAY'S TREATMENT:  11/05/21: See HEP - performed trial reps with mod cues for technique Manual: STM with compression to bil cervical paraspinals and sub occipitals Trigger Point Dry-Needling  Treatment instructions: Expect mild to moderate muscle soreness. S/S of pneumothorax if dry needled over a lung field, and to seek immediate medical attention should they occur. Patient verbalized understanding of these instructions and education.  Patient Consent Given: Yes Education handout provided: Yes Muscles treated: bil  cervical paraspinals, suboccipitals Electrical stimulation performed: No Parameters: N/A Treatment response/outcome: Twitch response elicited    PATIENT EDUCATION:  Education details: HEP, DN Person educated: Patient Education method: Explanation, Demonstration, and Handouts Education comprehension: verbalized understanding, returned demonstration, and needs further education   HOME EXERCISE PROGRAM: Access Code: [email or web address] URL: [email or web address] Date: 11/05/2021 Prepared by: Faustino Congress  Exercises Half Neck Circles - 2-3 x daily - 7 x weekly - 1 sets - 10 reps Seated Assisted Cervical Rotation with Towel - 2-3 x daily - 7 x weekly - 1 sets - 10 reps - 5 sec hold Cervical Extension AROM with Strap - 2-3 x daily - 7 x weekly - 1 sets - 10 reps - 5 sec hold  Patient Education Trigger Point Dry Needling  ASSESSMENT:  CLINICAL IMPRESSION: Patient is a 56 y.o. male who was seen today for physical therapy evaluation and treatment for chronic neck pain.    OBJECTIVE IMPAIRMENTS decreased ROM, decreased strength, hypomobility, increased fascial restrictions, increased muscle spasms, postural dysfunction, and pain.   ACTIVITY LIMITATIONS cleaning, community activity, and occupation.   PERSONAL FACTORS 3+ comorbidities: Anxiety, depression, DM, HTN, OA, PVD, hx PNA  are also affecting patient's functional outcome.    REHAB POTENTIAL: Good  CLINICAL DECISION MAKING: Evolving/moderate complexity  EVALUATION COMPLEXITY: Moderate   GOALS: Goals reviewed with patient? Yes  SHORT TERM GOALS:  Independent with initial HEP Target date: 11/26/2021 Goal status: INITIAL  LONG TERM GOALS:  Independent with final HEP Target date: 12/17/2021 Goal status: INITIAL  2.  FOTO score improved to 55 Target date: 12/17/2021 Goal status: INITIAL  3.  Cervical ROM improved by 5 deg for improved function Target date: 12/17/2021 Goal status: INITIAL  4.  Report  pain < 4/10 with cervical motion for improved function Target date: 12/17/2021 Goal status: INITIAL   PLAN: PT FREQUENCY: 1x/week  PT DURATION: 6 weeks  PLANNED INTERVENTIONS: Therapeutic exercises, Therapeutic activity, Neuromuscular re-education, Patient/Family education, Joint mobilization, Aquatic Therapy, Dry Needling, Electrical stimulation, Spinal mobilization, Cryotherapy, Moist heat, Taping, Traction,  and Manual therapy  PLAN FOR NEXT SESSION: assess response to DN, repeat PRN, postural exercises, review HEP    Laureen Abrahams, PT, DPT 11/05/21 10:13 AM

## 2021-11-08 ENCOUNTER — Encounter: Payer: Self-pay | Admitting: Rehabilitative and Restorative Service Providers"

## 2021-11-08 ENCOUNTER — Other Ambulatory Visit: Payer: Self-pay

## 2021-11-08 ENCOUNTER — Ambulatory Visit: Payer: Managed Care, Other (non HMO) | Admitting: Rehabilitative and Restorative Service Providers"

## 2021-11-08 DIAGNOSIS — M542 Cervicalgia: Secondary | ICD-10-CM | POA: Diagnosis not present

## 2021-11-08 DIAGNOSIS — R293 Abnormal posture: Secondary | ICD-10-CM

## 2021-11-08 NOTE — Therapy (Addendum)
OUTPATIENT PHYSICAL THERAPY TREATMENT NOTE   Patient Name: Jason [name] MRN: [id number] DOB:1965-12-06, 56 y.o., male Today's Date: 11/08/2021  PCP: Kristian CoveyBurchette, Bruce W, MD REFERRING PROVIDER: Kristian CoveyBurchette, Bruce W, MD   PT End of Session - 11/08/21 0913     Visit Number 2    Number of Visits 6    Date for PT Re-Evaluation 12/17/21    PT Start Time 0845    PT Stop Time 0930    PT Time Calculation (min) 45 min    Activity Tolerance Patient tolerated treatment well    [facility] assessed[facility]performed             Past Medical History:  Diagnosis Date   Anxiety    Depression    Diabetes mellitus without complication (HCC)    GERD (gastroesophageal reflux disease)    Headache(784.0)    Hyperlipidemia    Hypertension    MRSA infection    Left shin, back   Nerve damage    BLE   Neuromuscular disorder (HCC)    Osteoarthritis    Peripheral vascular disease (HCC)    Pneumonia    Past Surgical History:  Procedure Laterality Date   ABDOMINAL AORTOGRAM W/LOWER EXTREMITY N/A 12/01/2017   Procedure: ABDOMINAL AORTOGRAM W/LOWER EXTREMITY;  Surgeon: Sherren KernsFields, Charles E, MD;  Location: [facility] INVASIVE CV LAB;  Service: Cardiovascular;  Laterality: N/A;   ABDOMINAL AORTOGRAM W/LOWER EXTREMITY Bilateral 07/21/2021   Procedure: ABDOMINAL AORTOGRAM W/LOWER EXTREMITY;  Surgeon: Victorino Sparrowobins, Joshua E, MD;  [facility];  Service: Cardiovascular;  Laterality: Bilateral;   FEMORAL-POPLITEAL BYPASS GRAFT Right 12/11/2017   Procedure: BYPASS GRAFT RIGHT FEMORAL-ABOVE KNEE POPLITEAL ARTERY with Non Reversed Greater Saphenous Vein and Angioplasty distal vein Graft.;  Surgeon: Sherren KernsFields, Charles E, MD;  [facility];  Service: Vascular;  Laterality: Right;   PERIPHERAL VASCULAR BALLOON ANGIOPLASTY Right 12/01/2017   Procedure: PERIPHERAL VASCULAR BALLOON ANGIOPLASTY;  Surgeon: Sherren KernsFields, Charles E, MD;  Location: [facility] INVAS[facility] CV LAB;  Service: Cardiovascular;  Laterality:  Right;  superficial femoral, attempted   PERIPHERAL VASCULAR INTERVENTION Left 12/01/2017   Procedure: PERIPHERAL VASCULAR INTERVENTION;  Surgeon: Sherren KernsFields, Charles E, MD;  Location: [facility] INVASIVE CV LAB;  Service: Cardiovascular;  Laterality: Left;  External iliac   PERIPHERAL VASCULAR INTERVENTION Left 07/21/2021   Procedure: PERIPHERAL VASCULAR INTERVENTION;  Surgeon: Victorino Sparrowobins, Joshua E, MD;  [facility];  Service: Cardiovascular;  Laterality: Left;   ROTATOR CUFF REPAIR     TONSILLECTOMY     VEIN HARVEST Right 12/11/2017   Procedure: RIGHT GREATER SAPHENOUS VEIN HARVEST;  Surgeon: Sherren KernsFields, Charles E, MD;  [facility];  Service: Vascular;  Laterality: Right;   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) 03/10/2020   Primary osteoarthritis of right hip 07/12/2019   Avascular necrosis (HCC) 06/15/2019   PAD (peripheral artery disease) (HCC) 12/11/2017   Preoperative cardiovascular examination 12/05/2017   Peripheral arterial disease (HCC) 10/23/2017   History of MRSA infection 10/23/2017   Intermittent claudication (HCC) 10/17/2017   Herniated cervical intervertebral disc 08/01/2017   Osteoarthritis of spine 08/01/2017   Poorly controlled type 2 diabetes mellitus with circulatory disorder (HCC) 07/11/2017   Polyarthralgia 06/27/2017   Cervical pain (neck) 06/27/2017   Primary osteoarthritis of both knees  05/02/2017   Sensory neuronopathy 01/14/2017   Transaminasemia 10/10/2016   Vitamin B 12 deficiency 09/19/2016   Hyperglycemia 09/19/2016   Chest pressure 07/04/2012   Cough 07/04/2012   Hyperlipidemia 10/12/2010   ANXIETY 10/12/2010   History of alcohol abuse 10/12/2010   TOBACCO USE 10/12/2010   DEPRESSION 10/12/2010   Essential hypertension,  benign 10/12/2010   GERD 10/12/2010   Osteoarthritis 10/12/2010   Sleep apnea 10/12/2010    REFERRING DIAG:  M79.18 (ICD-10-CM) - Myofascial pain syndrome  M54.6,G89.29 (ICD-10-CM) - Chronic bilateral thoracic back pain  M54.2 (ICD-10-CM) - Cervicalgia    THERAPY DIAG:  Abnormal posture  Cervicalgia  PERTINENT HISTORY: Anxiety, depression, DM, HTN, OA, PVD, hx PNA  PRECAUTIONS: None  SUBJECTIVE: Jason [name] reports compliance with his early HEP.  No major changes since Friday (day 1).  PAIN:  Are you having pain? Yes: NPRS scale: 2-8/10 Pain location: Neck Pain description: B sharp, shooting Aggravating factors: Movement Relieving factors: Hot shower and exercises    OBJECTIVE:   DIAGNOSTIC FINDINGS:  MRI (2018): C6-7 level were there is chronic spondylosis with endplate osteophytes and protruding disc material. Narrowing of the ventral subarachnoid space but no compression of the cord. Bilateral foraminal stenosis could compress either or both C7 nerves. Left-sided predominant spondylosis and facet arthropathy at C3-4, C4-5 and C5-6 with left-sided foraminal narrowing at those levels that could possibly affect the C4, C5 and C6 nerves on the left.  X-ray and MRI findings show significant arthritis left more than right of the mid to upper cervical spine in the C3-4 and C4-5 regions.  PATIENT SURVEYS:  11/05/21 FOTO 51 (predicted 55)   COGNITION: Overall cognitive status: Within functional limits for tasks assessed   SENSATION: Light touch: Appears intact  POSTURE:  Rounded shoulders, forward head  PALPATION: 11/05/21:    CERVICAL AROM/PROM Pain with all motions  A/PROM A/PROM (deg) 11/05/2021  Flexion 40  Extension 20  Right lateral flexion 20  Left lateral flexion 22  Right rotation 59  Left rotation 49   (Blank rows = not tested)  UE AROM/PROM: WNL  UE MMT:  MMT Right 11/05/2021 Left 11/05/2021  Shoulder flexion 4/5 4/5  Shoulder extension     Shoulder abduction 4/5 4/5  Shoulder adduction    Shoulder extension    Shoulder internal rotation 5/5 5/5  Shoulder external rotation 5/5 5/5   (Blank rows = not tested)  CERVICAL SPECIAL TESTS:  Spurling's test: Negative and Distraction test: Negative    TODAY'S TREATMENT:  11/08/2021:  Therapeutic exercises:  Shoulder blade pinches (SBP) 10X 5 seconds Cervical Rotation AROM 10X 5 seconds (SBP 1st) Cervical Extension AROM 10X 5 seconds (SBP 1st) Cervical Extension Isometrics 10X 5 seconds  Therapeutic Activities: Education regarding posture, cervical spine anatomy, imaging results compared to normal anatomy, recommendations to take short, frequent breaks to correct posture and rest, proper use of lumbar roll.  Manual: STM with compression to bil cervical paraspinals and sub occipitals Trigger Point Dry-Needling  Treatment instructions: Expect mild to moderate muscle soreness. S/S of pneumothorax if dry needled over a lung field, and to seek immediate medical attention should they occur. Patient verbalized understanding of these instructions and education.  Patient Consent Given: Yes Education handout provided: Yes Muscles treated: bil cervical paraspinals, suboccipitals Electrical stimulation performed: No Parameters: N/A Treatment response/outcome: Twitch response elicited   11/05/21: See HEP - performed trial reps with mod cues for technique Manual: STM with compression to bil cervical paraspinals and sub occipitals for skilled  DN palpation Trigger Point Dry-Needling  Treatment instructions: Expect mild to moderate muscle soreness. S/S of pneumothorax if dry needled over a lung field, and to seek immediate medical attention should they occur. Patient verbalized understanding of these instructions and education.  Patient Consent Given: Yes Education handout provided: Yes Muscles treated: bil cervical paraspinals, suboccipitals Electrical stimulation performed:  No Parameters: N/A Treatment response/outcome: Twitch response elicited    PATIENT EDUCATION:  Education details: HEP, posture, imaging, anatomy, lumbar roll use, DN Person educated: Patient Education method: Explanation, Demonstration, and Handouts Education comprehension: verbalized understanding, returned demonstration, and needs further education   HOME EXERCISE PROGRAM: Access Code: [email or web address] URL: [email or web address] Date: 11/08/2021 Prepared by: Pauletta Browns  Exercises Half Neck Circles - 2-3 x daily - 7 x weekly - 1 sets - 10 reps Seated Assisted Cervical Rotation with Towel - 5 x daily - 7 x weekly - 1 sets - 5-10 reps - 5 seconds hold Cervical Extension AROM with Strap - 5 x daily - 7 x weekly - 1 sets - 5-10 reps - 5 seconds hold Standing Isometric Cervical Extension with Manual Resistance - 5 x daily - 7 x weekly - 1 sets - 5 reps - 5 second hold Shoulder Blade Pinches - 1 set - 10 reps - 5X daily  Patient Education Trigger Point Dry Needling Spine education as described above  ASSESSMENT:  CLINICAL IMPRESSION: Jason [name] reports early compliance with his HEP.  Postural awareness and body mechanics are going to be very important with his rehabilitation as will cervical AROM, scapular and cervical strength. Pt with good response to DN at last visit. DN performed again today to cervical paraspinals and suboccipitals with multiple twitch responses noted.  Continue POC as addressed above to meet LTGs.   OBJECTIVE IMPAIRMENTS decreased ROM, decreased strength, hypomobility, increased fascial restrictions, increased muscle spasms, postural dysfunction, and pain.   ACTIVITY LIMITATIONS cleaning, community activity, and occupation.   PERSONAL FACTORS 3+ comorbidities: Anxiety, depression, DM, HTN, OA, PVD, hx PNA are also affecting patient's functional outcome.    REHAB POTENTIAL: Good  CLINICAL DECISION MAKING: Evolving/moderate complexity  EVALUATION  COMPLEXITY: Moderate   GOALS: Goals reviewed with patient? Yes  SHORT TERM GOALS:  Independent with initial HEP Target date: 11/26/2021 Goal status: INITIAL  LONG TERM GOALS:  Independent with final HEP Target date: 12/17/2021 Goal status: INITIAL  2.  FOTO score improved to 55 Target date: 12/17/2021 Goal status: INITIAL  3.  Cervical ROM improved by 5 deg for improved function Target date: 12/17/2021 Goal status: INITIAL  4.  Report pain < 4/10 with cervical motion for improved function Target date: 12/17/2021 Goal status: INITIAL   PLAN: PT FREQUENCY: 1x/week  PT DURATION: 6 weeks  PLANNED INTERVENTIONS: Therapeutic exercises, Therapeutic activity, Neuromuscular re-education, Patient/Family education, Joint mobilization, Aquatic Therapy, Dry Needling, Electrical stimulation, Spinal mobilization, Cryotherapy, Moist heat, Taping, Traction, and Manual therapy  PLAN FOR NEXT SESSION: Review and progress scapular, cervical, postural exercises, practical body mechanics, DN PRN     Cherlyn Cushing, PT 11/08/2021, 9:42 AM  DN performed by Narda Amber, PT, MPT 11/08/21 4:24 PM

## 2021-11-15 ENCOUNTER — Telehealth: Payer: Self-pay | Admitting: Rehabilitative and Restorative Service Providers"

## 2021-11-15 NOTE — Telephone Encounter (Signed)
Called Mr. Hurta about not returning to OT for ~ 1 month. He has been going to PT though.  He did not answer and there was no VM option.  If he doesn't make some sore of contact with OT in next 2 weeks, he will be d/c for lack of follow up.  ?

## 2021-11-19 ENCOUNTER — Encounter: Payer: Managed Care, Other (non HMO) | Admitting: Rehabilitative and Restorative Service Providers"

## 2021-11-26 ENCOUNTER — Encounter: Payer: Self-pay | Admitting: Physical Therapy

## 2021-11-26 ENCOUNTER — Ambulatory Visit: Payer: Managed Care, Other (non HMO) | Admitting: Physical Therapy

## 2021-11-26 DIAGNOSIS — R293 Abnormal posture: Secondary | ICD-10-CM | POA: Diagnosis not present

## 2021-11-26 DIAGNOSIS — R29898 Other symptoms and signs involving the musculoskeletal system: Secondary | ICD-10-CM | POA: Diagnosis not present

## 2021-11-26 DIAGNOSIS — M542 Cervicalgia: Secondary | ICD-10-CM

## 2021-11-26 NOTE — Therapy (Addendum)
OUTPATIENT PHYSICAL THERAPY TREATMENT NOTE DISCHARGE SUMMARY   Patient Name: Jason [name] MRN: [id number] DOB:[birth year], 56 y.o., male Today's Date: 11/26/2021  PCP: Eulas Post, MD REFERRING PROVIDER: Magnus Sinning, MD   PT End of Session - 11/26/21 0759     Visit Number 3    Number of Visits 6    Date for PT Re-Evaluation 12/17/21    PT Start Time 0759    PT Stop Time 0828    PT Time Calculation (min) 29 min    Activity Tolerance Patient tolerated treatment well    Behavior During Therapy [facility] for tasks assessed/performed              Past Medical History:  Diagnosis Date   Anxiety    Depression    Diabetes mellitus without complication ([place])    GERD (gastroesophageal reflux disease)    Headache(784.0)    Hyperlipidemia    Hypertension    MRSA infection    Left shin, back   Nerve damage    BLE   Neuromuscular disorder ([place])    Osteoarthritis    Peripheral vascular disease ([place])    Pneumonia    Past Surgical History:  Procedure Laterality Date   ABDOMINAL AORTOGRAM W/LOWER EXTREMITY N/A 12/01/2017   Procedure: ABDOMINAL AORTOGRAM W/LOWER EXTREMITY;  Surgeon: Elam Dutch, MD;  Location: [facility];  Service: Cardiovascular;  Laterality: N/A;   ABDOMINAL AORTOGRAM W/LOWER EXTREMITY Bilateral 07/21/2021   Procedure: ABDOMINAL AORTOGRAM W/LOWER EXTREMITY;  Surgeon: Broadus John, MD;  Location: [facility];  Service: Cardiovascular;  Laterality: Bilateral;   FEMORAL-POPLITEAL BYPASS GRAFT Right 12/11/2017   Procedure: BYPASS GRAFT RIGHT FEMORAL-ABOVE KNEE POPLITEAL ARTERY with Non Reversed Greater Saphenous Vein and Angioplasty distal vein Graft.;  Surgeon: Elam Dutch, MD;  Location: [facility] OR;  Service: Vascular;  Laterality: Right;   PERIPHERAL VASCULAR BALLOON ANGIOPLASTY Right 12/01/2017   Procedure: PERIPHERAL VASCULAR BALLOON ANGIOPLASTY;  Surgeon: Elam Dutch, MD;  Location: [facility];  Service:  Cardiovascular;  Laterality: Right;  superficial femoral, attempted   PERIPHERAL VASCULAR INTERVENTION Left 12/01/2017   Procedure: PERIPHERAL VASCULAR INTERVENTION;  Surgeon: Elam Dutch, MD;  Location: [facility];  Service: Cardiovascular;  Laterality: Left;  External iliac   PERIPHERAL VASCULAR INTERVENTION Left 07/21/2021   Procedure: PERIPHERAL VASCULAR INTERVENTION;  Surgeon: Broadus John, MD;  Location: [facility];  Service: Cardiovascular;  Laterality: Left;   ROTATOR CUFF REPAIR     TONSILLECTOMY     VEIN HARVEST Right 12/11/2017   Procedure: RIGHT GREATER SAPHENOUS VEIN HARVEST;  Surgeon: Elam Dutch, MD;  Location: [facility] OR;  Service: Vascular;  Laterality: Right;   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 ([place]) 03/10/2020   Primary osteoarthritis of right hip 07/12/2019   Avascular necrosis ([place]) 06/15/2019   PAD (peripheral artery disease) ([place]) 12/11/2017   Preoperative cardiovascular examination 12/05/2017   Peripheral arterial disease ([place]) 10/23/2017   History of MRSA infection 10/23/2017   Intermittent claudication ([place]) 10/17/2017   Herniated cervical intervertebral disc 08/01/2017   Osteoarthritis of spine 08/01/2017   Poorly controlled type 2 diabetes mellitus with circulatory disorder ([place]) 07/11/2017   Polyarthralgia 06/27/2017   Cervical pain (neck) 06/27/2017   Primary osteoarthritis of  both knees 05/02/2017   Sensory neuronopathy 01/14/2017   Transaminasemia 10/10/2016   Vitamin B 12 deficiency 09/19/2016   Hyperglycemia 09/19/2016   Chest pressure 07/04/2012   Cough 07/04/2012   Hyperlipidemia 10/12/2010   ANXIETY 10/12/2010   History of alcohol abuse 10/12/2010   TOBACCO USE 10/12/2010   DEPRESSION  10/12/2010   Essential hypertension, benign 10/12/2010   GERD 10/12/2010   Osteoarthritis 10/12/2010   Sleep apnea 10/12/2010    REFERRING DIAG:  M79.18 (ICD-10-CM) - Myofascial pain syndrome  M54.6,G89.29 (ICD-10-CM) - Chronic bilateral thoracic back pain  M54.2 (ICD-10-CM) - Cervicalgia    THERAPY DIAG:  Abnormal posture  Cervicalgia  Other symptoms and signs involving the musculoskeletal system  PERTINENT HISTORY: Anxiety, depression, DM, HTN, OA, PVD, hx PNA  PRECAUTIONS: None  SUBJECTIVE: Neck is feeling better - trying to be consistent with HEP; started having a little pain in upper trap on Lt yesterday for first time in a couple weeks  PAIN:  Are you having pain? Yes: NPRS scale: 5/10 Pain location: Neck Pain description: B sharp, shooting Aggravating factors: Movement Relieving factors: Hot shower and exercises    OBJECTIVE:   DIAGNOSTIC FINDINGS:  MRI (2018): C6-7 level were there is chronic spondylosis with endplate osteophytes and protruding disc material. Narrowing of the ventral subarachnoid space but no compression of the cord. Bilateral foraminal stenosis could compress either or both C7 nerves. Left-sided predominant spondylosis and facet arthropathy at C3-4, C4-5 and C5-6 with left-sided foraminal narrowing at those levels that could possibly affect the C4, C5 and C6 nerves on the left.  X-ray and MRI findings show significant arthritis left more than right of the mid to upper cervical spine in the C3-4 and C4-5 regions.  PATIENT SURVEYS:  11/05/21 FOTO 51 (predicted 55) 11/26/21 FOTO 51   COGNITION: Overall cognitive status: Within functional limits for tasks assessed   SENSATION: Light touch: Appears intact  POSTURE:  Rounded shoulders, forward head  PALPATION: 11/05/21:    CERVICAL AROM/PROM Pain with all motions  A/PROM A/PROM (deg) 11/05/2021 AROM 11/26/21  Flexion 40 52  Extension 20 37  Right lateral flexion 20 31  Left  lateral flexion 22 37  Right rotation 59 58  Left rotation 49 65   (Blank rows = not tested)  UE AROM/PROM: WNL  UE MMT:  MMT Right 11/05/2021 Left 11/05/2021  Shoulder flexion 4/5 4/5  Shoulder extension    Shoulder abduction 4/5 4/5  Shoulder adduction    Shoulder extension    Shoulder internal rotation 5/5 5/5  Shoulder external rotation 5/5 5/5   (Blank rows = not tested)  CERVICAL SPECIAL TESTS:  Spurling's test: Negative and Distraction test: Negative    TODAY'S TREATMENT:  11/26/21 Therex:      Aerobic: UBE L3 x 6 min (3' each direction) Discussed HEP and need to continue - pt verbalized understanding and need for compliance   Manual: STM with compression to Lt upper trap and wrist flexors; skilled palpation and monitoring of soft tissue during DN Trigger Point Dry-Needling  Treatment instructions: Expect mild to moderate muscle soreness. S/S of pneumothorax if dry needled over a lung field, and to seek immediate medical attention should they occur. Patient verbalized understanding of these instructions and education.  Patient Consent Given: Yes Education handout provided: Previously provided Muscles treated: Lt upper trap and wrist flexors Electrical stimulation performed: No Parameters: N/A Treatment response/outcome: twitch response, decreased tightness  11/08/2021:  Therapeutic exercises:  Shoulder blade pinches (SBP) 10X 5  seconds Cervical Rotation AROM 10X 5 seconds (SBP 1st) Cervical Extension AROM 10X 5 seconds (SBP 1st) Cervical Extension Isometrics 10X 5 seconds  Therapeutic Activities: Education regarding posture, cervical spine anatomy, imaging results compared to normal anatomy, recommendations to take short, frequent breaks to correct posture and rest, proper use of lumbar roll.  Manual: STM with compression to bil cervical paraspinals and sub occipitals Trigger Point Dry-Needling  Treatment instructions: Expect mild to moderate muscle  soreness. S/S of pneumothorax if dry needled over a lung field, and to seek immediate medical attention should they occur. Patient verbalized understanding of these instructions and education.  Patient Consent Given: Yes Education handout provided: Yes Muscles treated: bil cervical paraspinals, suboccipitals Electrical stimulation performed: No Parameters: N/A Treatment response/outcome: Twitch response elicited   2/42/35: See HEP - performed trial reps with mod cues for technique Manual: STM with compression to bil cervical paraspinals and sub occipitals for skilled DN palpation Trigger Point Dry-Needling  Treatment instructions: Expect mild to moderate muscle soreness. S/S of pneumothorax if dry needled over a lung field, and to seek immediate medical attention should they occur. Patient verbalized understanding of these instructions and education.  Patient Consent Given: Yes Education handout provided: Yes Muscles treated: bil cervical paraspinals, suboccipitals Electrical stimulation performed: No Parameters: N/A Treatment response/outcome: Twitch response elicited    PATIENT EDUCATION:  Education details: HEP, posture, imaging, anatomy, lumbar roll use, DN Person educated: Patient Education method: Explanation, Demonstration, and Handouts Education comprehension: verbalized understanding, returned demonstration, and needs further education   HOME EXERCISE PROGRAM: Access Code: [email or web address] URL: [email or web address] Date: 11/08/2021 Prepared by: Vista Mink  Exercises Half Neck Circles - 2-3 x daily - 7 x weekly - 1 sets - 10 reps Seated Assisted Cervical Rotation with Towel - 5 x daily - 7 x weekly - 1 sets - 5-10 reps - 5 seconds hold Cervical Extension AROM with Strap - 5 x daily - 7 x weekly - 1 sets - 5-10 reps - 5 seconds hold Standing Isometric Cervical Extension with Manual Resistance - 5 x daily - 7 x weekly - 1 sets - 5 reps - 5 second  hold Shoulder Blade Pinches - 1 set - 10 reps - 5X daily  Patient Education Trigger Point Dry Needling Spine education as described above  ASSESSMENT:  CLINICAL IMPRESSION: Pt has met 3/4 LTGs at this time.  FOTO score not met but pt does report subjective improvement in function and decreased pain overall.  Will hold PT at this time.  Will d/c if pt doesn't return within 30 days, otherwise will reassess if pt does return.  OBJECTIVE IMPAIRMENTS decreased ROM, decreased strength, hypomobility, increased fascial restrictions, increased muscle spasms, postural dysfunction, and pain.   ACTIVITY LIMITATIONS cleaning, community activity, and occupation.   PERSONAL FACTORS 3+ comorbidities: Anxiety, depression, DM, HTN, OA, PVD, hx PNA are also affecting patient's functional outcome.    REHAB POTENTIAL: Good  CLINICAL DECISION MAKING: Evolving/moderate complexity  EVALUATION COMPLEXITY: Moderate   GOALS: Goals reviewed with patient? Yes  SHORT TERM GOALS:  Independent with initial HEP Target date: 11/26/2021 Goal status: MET 11/26/21  LONG TERM GOALS:  Independent with final HEP Target date: 12/17/2021 Goal status: MET 11/26/21  2.  FOTO score improved to 55 Target date: 12/17/2021 Goal status: NOT MET 11/26/21  3.  Cervical ROM improved by 5 deg for improved function Target date: 12/17/2021 Goal status: MET 11/26/21  4.  Report pain < 4/10 with cervical motion for improved function Target date:  12/17/2021 Goal status: MET 11/26/21   PLAN: PT FREQUENCY: 1x/week  PT DURATION: 6 weeks  PLANNED INTERVENTIONS: Therapeutic exercises, Therapeutic activity, Neuromuscular re-education, Patient/Family education, Joint mobilization, Aquatic Therapy, Dry Needling, Electrical stimulation, Spinal mobilization, Cryotherapy, Moist heat, Taping, Traction, and Manual therapy  PLAN FOR NEXT SESSION: hold PT x 30 days (recert or d/c)     Laureen Abrahams, PT, DPT 11/26/21 8:30  AM     PHYSICAL THERAPY DISCHARGE SUMMARY  Visits from Start of Care: 3  Current functional level related to goals / functional outcomes: See above   Remaining deficits: See above   Education / Equipment: HEP   Patient agrees to discharge. Patient goals were partially met. Patient is being discharged due to being pleased with the current functional level.  Laureen Abrahams, PT, DPT 01/26/22 9:09 AM  [facility] [address] [place], [place], [address] Phone: [phone]   Fax:  [phone]

## 2021-11-30 ENCOUNTER — Telehealth: Payer: Self-pay | Admitting: Family Medicine

## 2021-11-30 NOTE — Telephone Encounter (Signed)
Patient spouse called in requesting for patient to get a1c checked. Informed patient spouse that he will have to have an appt with Dr.Burchette but she wants me to just ask before scheduling an appt. ? ?Please advise. ?

## 2021-11-30 NOTE — Telephone Encounter (Signed)
Pt has been scheduled for DM f/u ?

## 2021-12-04 ENCOUNTER — Encounter (HOSPITAL_BASED_OUTPATIENT_CLINIC_OR_DEPARTMENT_OTHER): Payer: Self-pay

## 2021-12-04 ENCOUNTER — Emergency Department (HOSPITAL_BASED_OUTPATIENT_CLINIC_OR_DEPARTMENT_OTHER)
Admission: EM | Admit: 2021-12-04 | Discharge: 2021-12-04 | Disposition: A | Discharge status: Home / Self Care | Payer: Managed Care, Other (non HMO) | Attending: Emergency Medicine | Admitting: Emergency Medicine

## 2021-12-04 ENCOUNTER — Emergency Department (HOSPITAL_BASED_OUTPATIENT_CLINIC_OR_DEPARTMENT_OTHER): Payer: Managed Care, Other (non HMO) | Admitting: Radiology

## 2021-12-04 ENCOUNTER — Other Ambulatory Visit: Payer: Self-pay

## 2021-12-04 DIAGNOSIS — S61012A Laceration without foreign body of left thumb without damage to nail, initial encounter: Secondary | ICD-10-CM

## 2021-12-04 DIAGNOSIS — S62522B Displaced fracture of distal phalanx of left thumb, initial encounter for open fracture: Secondary | ICD-10-CM | POA: Diagnosis not present

## 2021-12-04 DIAGNOSIS — Z23 Encounter for immunization: Secondary | ICD-10-CM | POA: Insufficient documentation

## 2021-12-04 DIAGNOSIS — Y92009 Unspecified place in unspecified non-institutional (private) residence as the place of occurrence of the external cause: Secondary | ICD-10-CM | POA: Insufficient documentation

## 2021-12-04 DIAGNOSIS — Z794 Long term (current) use of insulin: Secondary | ICD-10-CM | POA: Diagnosis not present

## 2021-12-04 DIAGNOSIS — S6992XA Unspecified injury of left wrist, hand and finger(s), initial encounter: Secondary | ICD-10-CM | POA: Diagnosis present

## 2021-12-04 DIAGNOSIS — W312XXA Contact with powered woodworking and forming machines, initial encounter: Secondary | ICD-10-CM | POA: Diagnosis not present

## 2021-12-04 MED ORDER — OXYCODONE-ACETAMINOPHEN 5-325 MG PO TABS
1.0000 | ORAL_TABLET | Freq: Once | ORAL | Status: AC
  Administered 2021-12-04: 1 via ORAL
  Filled 2021-12-04: qty 1

## 2021-12-04 MED ORDER — CEPHALEXIN 500 MG PO CAPS: 500.0000 mg | ORAL_CAPSULE | Freq: Two times a day (BID) | ORAL | 0 refills | Status: AC

## 2021-12-04 MED ORDER — TETANUS-DIPHTH-ACELL PERTUSSIS 5-2.5-18.5 LF-MCG/0.5 IM SUSY
0.5000 mL | PREFILLED_SYRINGE | Freq: Once | INTRAMUSCULAR | Status: AC
  Administered 2021-12-04: 0.5 mL via INTRAMUSCULAR
  Filled 2021-12-04: qty 0.5

## 2021-12-04 MED ORDER — OXYCODONE-ACETAMINOPHEN 5-325 MG PO TABS: 1.0000 | ORAL_TABLET | Freq: Four times a day (QID) | ORAL | 0 refills | Status: DC | PRN

## 2021-12-04 MED ORDER — CEFAZOLIN SODIUM 1 G IM
2.0000 g | Freq: Once | INTRAMUSCULAR | Status: AC
  Administered 2021-12-04: 2 g via INTRAMUSCULAR
  Filled 2021-12-04: qty 20

## 2021-12-04 NOTE — Discharge Instructions (Addendum)
It was a pleasure taking care of you today!  ? ?Your x-ray showed fracture of your left thumb.  Attached is information for the on-call Orthopedist, Dr. Janee Morn. It is important that you call the orthopedist and inform them that you were seen in the ED to set up a follow-up appointment.  You are prescribed Percocet, take as prescribed.  Do not operate any heavy machinery or drive while taking this medication.  You may take over the counter 600 mg ibuprofen every 6 hours as needed for pain. You may apply ice to the affected area for up to 15 minutes at a time.  Ensure to place a barrier between your skin and the ice.  Return to the Emergency Department if you are experiencing increasing/worsening swelling, bruising, pain, or worsening symptoms. ?

## 2021-12-04 NOTE — ED Provider Notes (Signed)
?[facility] EM[facility]PT ?Provider Note ? ? ?CSN: [id number] ?Arrival date & time: 12/04/21  8727 ? ?  ? ?History ? ?Chief Complaint  ?Patient presents with  ? L thumb laceration  ? ? ?CONLEE SLITER is a 56 y.o. male who presents to the emergency department complaining of left thumb laceration onset prior to arrival.  He notes that he was using a table saw which caused the injury while at home today.  He denies being on anticoagulants at this time.  He is right-hand dominant.  He is unsure of his last tetanus.  Denies fever, chills, drainage, color change. ? ? ?The history is provided by the patient. No language interpreter was used.  ? ?  ? ?Home Medications ?Prior to Admission medications   ?Medication Sig Start Date End Date Taking? Authorizing Provider  ?amitriptyline (ELAVIL) 25 MG tablet TAKE 1 TABLET BY MOUTH EVERYDAY AT BEDTIME 09/07/21   Burchette, Elberta Fortis, MD  ?atorvastatin (LIPITOR) 40 MG tablet TAKE 1 TABLET BY MOUTH EVERY DAY 10/29/21   Burchette, Elberta Fortis, MD  ?Blood Glucose Monitoring Suppl (CONTOUR BLOOD GLUCOSE SYSTEM) DEVI Use once daily for diabetes Dx E11.9 07/14/17   Burchette, Elberta Fortis, MD  ?celecoxib (CELEBREX) 200 MG capsule TAKE 1 CAPSULE BY MOUTH EVERY DAY 10/25/21   Burchette, Elberta Fortis, MD  ?clopidogrel (PLAVIX) 75 MG tablet Take 1 tablet (75 mg total) by mouth daily. 07/21/21 07/21/22  Victorino Sparrow, MD  ?CONTOUR NEXT TEST test strip TEST GLUCOSE ONCE DAILY. DX E 11.9 03/03/20   Burchette, Elberta Fortis, MD  ?DULoxetine (CYMBALTA) 60 MG capsule TAKE 1 CAPSULE BY MOUTH EVERY DAY 10/29/21   Burchette, Elberta Fortis, MD  ?ezetimibe (ZETIA) 10 MG tablet TAKE 1 TABLET BY MOUTH EVERY DAY 10/29/21   Burchette, Elberta Fortis, MD  ?gabapentin (NEURONTIN) 300 MG capsule TAKE ONE CAPSULE BY MOUTH EVERY MORNING AND TAKE 2 CAPSULES BY MOUTH AT BEDTIME 10/29/21   Burchette, Elberta Fortis, MD  ?JARDIANCE 10 MG TABS tablet TAKE 1 TABLET BY MOUTH EVERY DAY 10/29/21   Burchette, Elberta Fortis, MD  ?Lancets MISC Contour Lancets.   Test glucose once daily. DxE11.9 07/14/17   Burchette, Elberta Fortis, MD  ?metFORMIN (GLUCOPHAGE) 500 MG tablet TAKE 2 TABLETS BY MOUTH TWICE A DAY 10/29/21   Burchette, Elberta Fortis, MD  ?traMADol (ULTRAM) 50 MG tablet Take 1 tablet (50 mg total) by mouth every 8 (eight) hours as needed. ?Patient not taking: Reported on 10/22/2021 09/21/21   Juanda Chance, NP  ?TRULICITY 0.75 MG/0.5ML SOPN INJECT 0.75 MG INTO THE SKIN ONCE A WEEK. 10/29/21   Burchette, Elberta Fortis, MD  ?   ? ?Allergies    ?Olmesartan medoxomil   ? ?Review of Systems   ?Review of Systems  ?Constitutional:  Negative for chills and fever.  ?Musculoskeletal:  Positive for arthralgias.  ?Skin:  Positive for wound. Negative for color change.  ?All other systems reviewed and are negative. ? ?Physical Exam ?Updated Vital Signs ?BP (!) 142/77 (BP Location: Right Arm)   Pulse 67   Resp 19   SpO2 100%  ?Physical Exam ?Vitals and nursing note reviewed.  ?Constitutional:   ?   General: He is not in acute distress. ?   Appearance: Normal appearance. He is not ill-appearing.  ?HENT:  ?   Head: Normocephalic and atraumatic.  ?   Right Ear: External ear normal.  ?   Left Ear: External ear normal.  ?Eyes:  ?   General: No scleral  icterus. ?   Extraocular Movements: Extraocular movements intact.  ?Cardiovascular:  ?   Rate and Rhythm: Normal rate.  ?Pulmonary:  ?   Effort: Pulmonary effort is normal. No respiratory distress.  ?Musculoskeletal:     ?   General: Normal range of motion.  ?   Cervical back: Normal range of motion and neck supple.  ?   Comments: Mild tenderness to palpation to right first digit. No obvious deformity, effusion, erythema, or swelling.  Full active range of motion of right first digit.  Finger to thumb opposition intact.  Radial pulses intact.  Capillary refill less than 2 seconds.  ?Skin: ?   General: Skin is warm and dry.  ?   Capillary Refill: Capillary refill takes less than 2 seconds.  ?   Findings: Laceration present. No bruising, erythema or rash.   ?   Comments: Jagged laceration noted to the lateral aspect of the left first digit.  Bleeding controlled at this time.  See picture below.   ?Neurological:  ?   Mental Status: He is alert.  ?Psychiatric:     ?   Behavior: Behavior normal.  ? ? ? ? ? ? ? ?ED Results / Procedures / Treatments   ?Labs ?(all labs ordered are listed, but only abnormal results are displayed) ?Labs Reviewed - No data to display ? ?EKG ?None ? ?Radiology ?DG Finger Thumb Left ? ?Result Date: 12/04/2021 ?CLINICAL DATA:  Table saw laceration EXAM: LEFT THUMB 2+V COMPARISON:  None. FINDINGS: Probable nondisplaced fracture of the tuft of the left thumb distal phalanx. Overlying soft tissue laceration. No radiopaque foreign body. Mild arthrosis of the included thumb. IMPRESSION: Probable nondisplaced fracture of the tuft of the left thumb distal phalanx. Overlying soft tissue laceration. No radiopaque foreign body. Electronically Signed   By: Jearld LeschAlex D Bibbey M.D.   On: 12/04/2021 10:27   ? ?Procedures ?Procedures  ? ? ?Medications Ordered in ED ?Medications - No data to display ? ?ED Course/ Medical Decision Making/ A&P ?Clinical Course as of 12/04/21 2011  ?Sat Dec 04, 2021  ?1413 Consult with Dr. Janee Mornhompson who recommends p.o. antibiotic, his office will call the patient on 12/06/2021 to set up a follow-up appointment.  Also agreeable with Ancef and Tdap updated in the ED. [SB]  ?  ?Clinical Course User Index ?[SB] Garrick Midgley A, PA-C  ? ?                        ?Medical Decision Making ?Amount and/or Complexity of Data Reviewed ?Radiology: ordered. ? ?Risk ?Prescription drug management. ? ? ?Patient presents with laceration to the left thumb onset prior to arrival due to a table saw. Pt is not on anticoagulants at this time. Vital signs, stable, not tachycardic or hypoxic.  On exam, patient with mild tenderness to palpation noted to right first digit.  Finger to thumb opposition intact.  Radial pulses intact.  Capillary refill less than 2  seconds.  Jagged laceration noted to lateral aspect of first digit at the distal phalanx.  Tetanus updated in the ED.  Unable to approximate wound due to jagged skin avulsion, therefore no laceration repair in the ED.  Differential diagnosis includes fracture, foreign body, dislocation, avulsion.  ? ?Imaging: ?I ordered imaging studies including left thumb x-ray  ?I independently visualized and interpreted imaging which showed:  ?Probable nondisplaced fracture of the tuft of the left thumb distal  ?phalanx. Overlying soft tissue laceration. No radiopaque foreign  ?body.  ? ?  I agree with the radiologist interpretation ? ?Medications:  ?I ordered medication including Percocet, Ancef, Tdap updated for symptom management, antibiotic prophylaxis, tetanus prophylaxis ?Reevaluation of the patient after these medicines and interventions, I reevaluated the patient and found that they have improved ?I have reviewed the patients home medicines and have made adjustments as needed ? ? ?Consultations: ?I requested consultation with the Hand Surgeon, Dr. Janee Morn, and discussed lab and imaging findings as well as pertinent plan - they recommend: Cleaning the area, prescription antibiotics, bandaging.  Also recommends that their office will call the patient on/10/23 to set up a follow-up appointment. ? ? ?Disposition: ?Patient presentation suspicious for open tuft fracture of left distal thumb with avulsion of the skin.  Doubt dislocation or foreign body at this time.  Tetanus updated in the ED.  Due to mechanism of injury, and inability to approximate wound in the ED, no repair at this time.  Wound thoroughly irrigated, no foreign bodies noted.  I feel the patient would benefit from discharge home.  Discussed with patient wound care and answered available questions.  Also discussed with patient that the on-call orthopedist will give him a call on/10/23 to set up a follow-up appoint regarding today's ED visit.  Discussed with  patient to follow-up for wound check should there be signs of dehiscence or infection. Pt is hemodynamically stable with no complaints prior to discharge. Supportive care measures and strict return precautions discuss

## 2021-12-04 NOTE — ED Triage Notes (Signed)
He has a lac. At ant. Long-axis of distal phalanx of left thumb. He states this was the result of a "table saw" injury which he was using at his home. Clean dressing applied at triage. Minimal bleeding at present. ?

## 2021-12-10 ENCOUNTER — Encounter: Payer: Managed Care, Other (non HMO) | Admitting: Physical Therapy

## 2021-12-17 ENCOUNTER — Encounter: Payer: Self-pay | Admitting: Family Medicine

## 2021-12-17 ENCOUNTER — Ambulatory Visit: Payer: Managed Care, Other (non HMO) | Admitting: Family Medicine

## 2021-12-17 VITALS — BP 136/70 | HR 80 | Temp 98.0°F | Ht 70.0 in | Wt 214.3 lb

## 2021-12-17 DIAGNOSIS — E1165 Type 2 diabetes mellitus with hyperglycemia: Secondary | ICD-10-CM | POA: Diagnosis not present

## 2021-12-17 DIAGNOSIS — I10 Essential (primary) hypertension: Secondary | ICD-10-CM | POA: Diagnosis not present

## 2021-12-17 DIAGNOSIS — E1159 Type 2 diabetes mellitus with other circulatory complications: Secondary | ICD-10-CM

## 2021-12-17 DIAGNOSIS — E7801 Familial hypercholesterolemia: Secondary | ICD-10-CM | POA: Diagnosis not present

## 2021-12-17 DIAGNOSIS — I739 Peripheral vascular disease, unspecified: Secondary | ICD-10-CM

## 2021-12-17 LAB — POCT GLYCOSYLATED HEMOGLOBIN (HGB A1C): Hemoglobin A1C: 9.1 % — AB (ref 4.0–5.6)

## 2021-12-17 MED ORDER — TRULICITY 1.5 MG/0.5ML ~~LOC~~ SOAJ: 1.5000 mg | SUBCUTANEOUS | 11 refills | Status: DC

## 2021-12-17 MED ORDER — DICLOFENAC SODIUM 75 MG PO TBEC: 75.0000 mg | DELAYED_RELEASE_TABLET | Freq: Two times a day (BID) | ORAL | 3 refills | Status: DC

## 2021-12-17 NOTE — Progress Notes (Signed)
?Subjective:  ?  ? Patient ID: Jason [name], male   DOB: July 11, 1966, 56 y.o.   MRN: [id number] ? ?HPI ? ?Seen for medical follow-up.  He had recent fairly serious laceration left thumb from a table saw.  Has seen hand surgeon.  No surgery required.  He states this is healing well.  His current problems include history of peripheral vascular disease, hypertension, sleep apnea, type 2 diabetes, dyslipidemia, osteoarthritis involving multiple joints, remote history of alcohol abuse. ? ?His major complaint is ongoing arthritis.  He has significant arthritis especially going hands but multiple other joints as well.  Currently Celebrex 200 mg daily but poor control.  Taken Tylenol with no relief.  Hips avoid opioids. ? ?Type 2 diabetes.  Remains on Trulicity.  Remains on metformin.  He stopped Jardiance on his own because of some redness around the glans of the penis.  This resolved after stopping.  He is no longer on Januvia.  Is tolerating Trulicity and states he is taking that regularly. ? ?He has hyperlipidemia.  Especially on atorvastatin and Zetia but stopped these stating he was just tired of taking so many pills.  No specific side effects.  Denies any recent chest pains.  Also not taking his Plavix. ? ?Past Medical History:  ?Diagnosis Date  ? Anxiety   ? Depression   ? Diabetes mellitus without complication (HCC)   ? GERD (gastroesophageal reflux disease)   ? Headache(784.0)   ? Hyperlipidemia   ? Hypertension   ? MRSA infection   ? Left shin, back  ? Nerve damage   ? BLE  ? Neuromuscular disorder (HCC)   ? Osteoarthritis   ? Peripheral vascular disease (HCC)   ? Pneumonia   ? ?Past Surgical History:  ?Procedure Laterality Date  ? ABDOMINAL AORTOGRAM W/LOWER EXTREMITY N/A 12/01/2017  ? Procedure: ABDOMINAL AORTOGRAM W/LOWER EXTREMITY;  Surgeon: Sherren Kerns, MD;  Location: [facility] INVASIVE CV LAB;  Service: Cardiovascular;  Laterality: N/A;  ? ABDOMINAL AORTOGRAM W/LOWER EXTREMITY Bilateral 07/21/2021  ?  Procedure: ABDOMINAL AORTOGRAM W/LOWER EXTREMITY;  Surgeon: Victorino Sparrow, MD;  Location: [facility] INVASIVE CV LAB;  Service: Cardiovascular;  Laterality: Bilateral;  ? FEMORAL-POPLITEAL BYPASS GRAFT Right 12/11/2017  ? Procedure: BYPASS GRAFT RIGHT FEMORAL-ABOVE KNEE POPLITEAL ARTERY with Non Reversed Greater Saphenous Vein and Angioplasty distal vein Graft.;  Surgeon: Sherren Kerns, MD;  Location: [facility] OR;  Service: Vascular;  Laterality: Right;  ? PERIPHERAL VASCULAR BALLOON ANGIOPLASTY Right 12/01/2017  ? Procedure: PERIPHERAL VASCULAR BALLOON ANGIOPLASTY;  Surgeon: Sherren Kerns, MD;  Location: [facility] INVASIVE CV LAB;  Service: Cardiovascular;  Laterality: Right;  superficial femoral, attempted  ? PERIPHERAL VASCULAR INTERVENTION Left 12/01/2017  ? Procedure: PERIPHERAL VASCULAR INTERVENTION;  Surgeon: Sherren Kerns, MD;  Location: [facility] INVASIVE CV LAB;  Service: Cardiovascular;  Laterality: Left;  External iliac  ? PERIPHERAL VASCULAR INTERVENTION Left 07/21/2021  ? Procedure: PERIPHERAL VASCULAR INTERVENTION;  Surgeon: Victorino Sparrow, MD;  Location: [facility] INVASIVE CV LAB;  Service: Cardiovascular;  Laterality: Left;  ? ROTATOR CUFF REPAIR    ? TONSILLECTOMY    ? VEIN HARVEST Right 12/11/2017  ? Procedure: RIGHT GREATER SAPHENOUS VEIN HARVEST;  Surgeon: Sherren Kerns, MD;  Location: [facility] OR;  Service: Vascular;  Laterality: Right;  ? ? reports that he has been smoking cigarettes. He has a 50.00 pack-year smoking history. He has never used smokeless tobacco. He reports that he does not currently use alcohol after a past usage of about 20.0 standard  drinks per week. He reports that he does not use drugs. ?family history includes Arthritis in his mother; Asthma in his maternal grandfather; Heart attack (age of onset: 3) in his father; Heart disease in an other family member; Hypertension in his father; Mental illness in an other family member; Stroke in his paternal grandfather. ?Allergies  ?Allergen Reactions  ?  Olmesartan Medoxomil Other (See Comments)  ?  dizziness  ? ? ? ?Review of Systems  ?Constitutional:  Negative for fatigue.  ?Eyes:  Negative for visual disturbance.  ?Respiratory:  Negative for cough, chest tightness and shortness of breath.   ?Cardiovascular:  Negative for chest pain, palpitations and leg swelling.  ?Endocrine: Negative for polydipsia and polyuria.  ?Genitourinary:  Negative for dysuria.  ?Musculoskeletal:  Positive for arthralgias.  ?Neurological:  Negative for dizziness, syncope, weakness, light-headedness and headaches.  ? ?   ?Objective:  ? Physical Exam ?Constitutional:   ?   Appearance: He is well-developed.  ?HENT:  ?   Right Ear: External ear normal.  ?   Left Ear: External ear normal.  ?Eyes:  ?   Pupils: Pupils are equal, round, and reactive to light.  ?Neck:  ?   Thyroid: No thyromegaly.  ?Cardiovascular:  ?   Rate and Rhythm: Normal rate and regular rhythm.  ?Pulmonary:  ?   Effort: Pulmonary effort is normal. No respiratory distress.  ?   Breath sounds: Normal breath sounds. No wheezing or rales.  ?Musculoskeletal:  ?   Cervical back: Neck supple.  ?   Right lower leg: No edema.  ?   Left lower leg: No edema.  ?Neurological:  ?   Mental Status: He is alert and oriented to person, place, and time.  ? ? ?   ?Assessment:  ?   ?#1 type 2 diabetes poorly controlled with A1c 9.1%.  History of poor compliance to medication.  Patient stopped Jardiance on his own as above. ? ?-Continue current dose of metformin 500 mg 2 tablets twice daily and increase Trulicity to 1.5 mg subcutaneous once weekly ?-Step up dietary compliance ?-Set up 28-month follow-up ? ?#2 dyslipidemia.  Poor compliance with medications.  Get back on atorvastatin and Zetia.  Recheck fasting labs at follow-up ? ?#3 osteoarthritis involving multiple joints.  Poor control.  Patient requesting other medication options.  We discussed potential risk of long-term nonsteroidals.  We will try at least short-term diclofenac 75 mg twice  daily with food.  Stop Celebrex. ? ?#4 hypertension history currently stable ?   ?Plan:  ?   ?-Plan as above with increasing Trulicity and getting back on cholesterol medications and reassess within 3 months ?-Also needs liver panel at follow-up if he remains on the diclofenac ? ?[name] MD ?[facility] ? ?   ?

## 2021-12-17 NOTE — Patient Instructions (Signed)
GET BACK ON YOUR CHOLESTEROL MEDICATION ? ?Set up 3 month follow up.  ?

## 2022-02-23 ENCOUNTER — Other Ambulatory Visit: Payer: Self-pay | Admitting: Vascular Surgery

## 2022-02-23 DIAGNOSIS — I739 Peripheral vascular disease, unspecified: Secondary | ICD-10-CM

## 2022-03-15 ENCOUNTER — Other Ambulatory Visit: Payer: Self-pay | Admitting: Family Medicine

## 2022-03-15 DIAGNOSIS — E7801 Familial hypercholesterolemia: Secondary | ICD-10-CM

## 2022-03-15 DIAGNOSIS — E785 Hyperlipidemia, unspecified: Secondary | ICD-10-CM

## 2022-03-16 ENCOUNTER — Other Ambulatory Visit: Payer: Self-pay | Admitting: Family Medicine

## 2022-03-22 ENCOUNTER — Other Ambulatory Visit: Payer: Self-pay | Admitting: Family Medicine

## 2022-04-13 ENCOUNTER — Other Ambulatory Visit: Payer: Self-pay | Admitting: Family Medicine

## 2022-06-12 ENCOUNTER — Other Ambulatory Visit: Payer: Self-pay | Admitting: Family Medicine

## 2022-06-12 DIAGNOSIS — E785 Hyperlipidemia, unspecified: Secondary | ICD-10-CM

## 2022-06-12 DIAGNOSIS — E7801 Familial hypercholesterolemia: Secondary | ICD-10-CM

## 2022-06-13 ENCOUNTER — Other Ambulatory Visit: Payer: Self-pay | Admitting: Family Medicine

## 2022-07-07 ENCOUNTER — Other Ambulatory Visit: Payer: Self-pay | Admitting: Family Medicine

## 2022-07-07 DIAGNOSIS — E785 Hyperlipidemia, unspecified: Secondary | ICD-10-CM

## 2022-07-07 DIAGNOSIS — E7801 Familial hypercholesterolemia: Secondary | ICD-10-CM

## 2022-07-18 ENCOUNTER — Other Ambulatory Visit: Payer: Self-pay | Admitting: Vascular Surgery

## 2022-08-03 ENCOUNTER — Telehealth: Payer: Self-pay | Admitting: Family Medicine

## 2022-08-03 MED ORDER — METHOCARBAMOL 500 MG PO TABS
ORAL_TABLET | ORAL | 0 refills | Status: DC
Start: 2022-08-03 — End: 2022-11-18

## 2022-08-03 NOTE — Telephone Encounter (Signed)
Rx sent and patient aware 

## 2022-08-03 NOTE — Telephone Encounter (Signed)
I spoke with the patient and offered visit with a provider tomorrow since PCP is put of office and we have no openings today. Patient declined appointment at this time and said he will go to pharmacy for medication. Patient requested if message can still be sent to PCP.

## 2022-08-03 NOTE — Telephone Encounter (Signed)
Pt's spouse called to say Pt had a fall & hurt his back and they are wondering if MD could please send a muscle relaxer?  Please advise.  [id number] [place] [place] - [address] Phone: [phone]  Fax: [phone]     LOV:  12/17/21

## 2022-08-17 ENCOUNTER — Other Ambulatory Visit: Payer: Self-pay | Admitting: Family Medicine

## 2022-08-26 ENCOUNTER — Other Ambulatory Visit: Payer: Self-pay | Admitting: Vascular Surgery

## 2022-08-26 DIAGNOSIS — I739 Peripheral vascular disease, unspecified: Secondary | ICD-10-CM

## 2022-09-10 ENCOUNTER — Other Ambulatory Visit: Payer: Self-pay | Admitting: Family Medicine

## 2022-10-05 ENCOUNTER — Other Ambulatory Visit: Payer: Self-pay | Admitting: Family Medicine

## 2022-10-05 DIAGNOSIS — E7801 Familial hypercholesterolemia: Secondary | ICD-10-CM

## 2022-10-05 DIAGNOSIS — E785 Hyperlipidemia, unspecified: Secondary | ICD-10-CM

## 2022-10-10 ENCOUNTER — Other Ambulatory Visit: Payer: Self-pay | Admitting: Family Medicine

## 2022-10-17 ENCOUNTER — Telehealth: Payer: Self-pay | Admitting: Family Medicine

## 2022-10-17 ENCOUNTER — Other Ambulatory Visit: Payer: Self-pay | Admitting: Family Medicine

## 2022-10-17 NOTE — Telephone Encounter (Signed)
Patient spouse says to reach out to her if anything needs to be discussed

## 2022-10-17 NOTE — Telephone Encounter (Signed)
Patient's wife informed of the message below and patient scheduled for tomorrow

## 2022-10-17 NOTE — Telephone Encounter (Addendum)
Spouse called to say Pharmacy is telling her they are completely out of the Dulaglutide (TRULICITY) 1.5 0000000 SOPN   And they do not know when they will be getting more, as the issue is with the [employer]  Also, Pt would like to request 2 sleeping pills (one for departure / one for return) for the flight he is taking out of the country soon.   Please advise.  [id number] [place] Phone: [phone] Fax: [phone]

## 2022-10-18 ENCOUNTER — Ambulatory Visit: Payer: Managed Care, Other (non HMO) | Admitting: Family Medicine

## 2022-10-18 ENCOUNTER — Encounter: Payer: Self-pay | Admitting: Family Medicine

## 2022-10-18 DIAGNOSIS — E7801 Familial hypercholesterolemia: Secondary | ICD-10-CM

## 2022-10-18 DIAGNOSIS — E1165 Type 2 diabetes mellitus with hyperglycemia: Secondary | ICD-10-CM

## 2022-10-18 DIAGNOSIS — E538 Deficiency of other specified B group vitamins: Secondary | ICD-10-CM | POA: Diagnosis not present

## 2022-10-18 DIAGNOSIS — E1159 Type 2 diabetes mellitus with other circulatory complications: Secondary | ICD-10-CM

## 2022-10-18 DIAGNOSIS — I10 Essential (primary) hypertension: Secondary | ICD-10-CM

## 2022-10-18 LAB — POCT GLYCOSYLATED HEMOGLOBIN (HGB A1C): Hemoglobin A1C: 7.3 % — AB (ref 4.0–5.6)

## 2022-10-18 MED ORDER — LORAZEPAM 0.5 MG PO TABS: ORAL_TABLET | ORAL | 0 refills | Status: DC

## 2022-10-18 NOTE — Progress Notes (Signed)
Established Patient Office Visit  Subjective   Patient ID: Jason [name], male    DOB: [birth year]  Age: 57 y.o. MRN: [id number]  Chief Complaint  Patient presents with   Medical Management of Chronic Issues    HPI   Age and is here for medical follow-up.  He has multiple chronic problems including hypertension, history of PAD, ongoing nicotine use, GERD, type 2 diabetes, peripheral neuropathy, osteoarthritis, remote history of alcohol abuse.  He has been abstinent now for years.  He had called yesterday because of upcoming trip to [place] with 17-hour flight.  He is very concerned about anxiety issues especially with not smoking on the plane that length of time.  He is also concerned about difficulty sleeping.  His last A1c was 9.1%.  We put him on Trulicity and he is tolerating 1.5 mg subcutaneous weekly.  He initially states that he had a little difficulty getting his medication at the pharmacy but has been out of iron that out.  He does have history of B12 deficiency and no longer taking B12.  Had some recent intermittent numbness in the toes.  He is overdue for labs.  He also remains on metformin, atorvastatin, Plavix, Zetia.  Past Medical History:  Diagnosis Date   Anxiety    Depression    Diabetes mellitus without complication (HCC)    GERD (gastroesophageal reflux disease)    Headache(784.0)    Hyperlipidemia    Hypertension    MRSA infection    Left shin, back   Nerve damage    BLE   Neuromuscular disorder ([place])    Osteoarthritis    Peripheral vascular disease ([place])    Pneumonia    Past Surgical History:  Procedure Laterality Date   ABDOMINAL AORTOGRAM W/LOWER EXTREMITY N/A 12/01/2017   Procedure: ABDOMINAL AORTOGRAM W/LOWER EXTREMITY;  Surgeon: Elam Dutch, MD;  Location: [place] CV LAB;  Service: Cardiovascular;  Laterality: N/A;   ABDOMINAL AORTOGRAM W/LOWER EXTREMITY Bilateral 07/21/2021   Procedure: ABDOMINAL AORTOGRAM W/LOWER EXTREMITY;   Surgeon: Broadus John, MD;  Location: [place] CV LAB;  Service: Cardiovascular;  Laterality: Bilateral;   FEMORAL-POPLITEAL BYPASS GRAFT Right 12/11/2017   Procedure: BYPASS GRAFT RIGHT FEMORAL-ABOVE KNEE POPLITEAL ARTERY with Non Reversed Greater Saphenous Vein and Angioplasty distal vein Graft.;  Surgeon: Elam Dutch, MD;  Location: [facility] OR;  Service: Vascular;  Laterality: Right;   PERIPHERAL VASCULAR BALLOON ANGIOPLASTY Right 12/01/2017   Procedure: PERIPHERAL VASCULAR BALLOON ANGIOPLASTY;  Surgeon: Elam Dutch, MD;  Location: [place] CV LAB;  Service: Cardiovascular;  Laterality: Right;  superficial femoral, attempted   PERIPHERAL VASCULAR INTERVENTION Left 12/01/2017   Procedure: PERIPHERAL VASCULAR INTERVENTION;  Surgeon: Elam Dutch, MD;  Location: [place] CV LAB;  Service: Cardiovascular;  Laterality: Left;  External iliac   PERIPHERAL VASCULAR INTERVENTION Left 07/21/2021   Procedure: PERIPHERAL VASCULAR INTERVENTION;  Surgeon: Broadus John, MD;  Location: [place] CV LAB;  Service: Cardiovascular;  Laterality: Left;   ROTATOR CUFF REPAIR     TONSILLECTOMY     VEIN HARVEST Right 12/11/2017   Procedure: RIGHT GREATER SAPHENOUS VEIN HARVEST;  Surgeon: Elam Dutch, MD;  Location: [place];  Service: Vascular;  Laterality: Right;    reports that he has been smoking cigarettes. He has a 50.00 pack-year smoking history. He has never used smokeless tobacco. He reports that he does not currently use alcohol after a past usage of about 20.0 standard drinks of alcohol per week.  He reports that he does not use drugs. family history includes Arthritis in his mother; Asthma in his maternal grandfather; Heart attack (age of onset: 93) in his father; Heart disease in an other family member; Hypertension in his father; Mental illness in an other family member; Stroke in his paternal grandfather. Allergies  Allergen Reactions   Olmesartan Medoxomil Other (See Comments)     dizziness    Review of Systems  Constitutional:  Negative for chills, fever and malaise/fatigue.  Eyes:  Negative for blurred vision.  Respiratory:  Negative for shortness of breath.   Cardiovascular:  Negative for chest pain.  Gastrointestinal:  Negative for abdominal pain.  Neurological:  Negative for dizziness, weakness and headaches.      Objective:     There were no vitals taken for this visit.   Physical Exam Vitals reviewed.  Constitutional:      General: He is not in acute distress.    Appearance: He is well-developed. He is not toxic-appearing.  HENT:     Right Ear: External ear normal.     Left Ear: External ear normal.  Eyes:     Pupils: Pupils are equal, round, and reactive to light.  Neck:     Thyroid: No thyromegaly.  Cardiovascular:     Rate and Rhythm: Normal rate and regular rhythm.  Pulmonary:     Effort: Pulmonary effort is normal. No respiratory distress.     Breath sounds: Normal breath sounds. No wheezing or rales.  Musculoskeletal:     Cervical back: Neck supple.     Right lower leg: No edema.     Left lower leg: No edema.  Neurological:     Mental Status: He is alert and oriented to person, place, and time.      Results for orders placed or performed in visit on 10/18/22  POC HgB A1c  Result Value Ref Range   Hemoglobin A1C 7.3 (A) 4.0 - 5.6 %   HbA1c POC (<> result, manual entry)     HbA1c, POC (prediabetic range)     HbA1c, POC (controlled diabetic range)        The 10-year ASCVD risk score (Arnett DK, et al., 2019) is: 26.6%    Assessment & Plan:   #1 type 2 diabetes improved with A1c today 7.3% down from 9.1% previously.  This is improved with Trulicity.  We did discuss possible further titration of his Trulicity but he declines at this time.  He would like to wait till he gets back from his trip to [place].  Recommend yearly diabetic eye exam.  Check urine microalbumin when he gets back for labs  #2 hypertension  stable and fairly well-controlled.  Continue current medication above  #3 hyperlipidemia treated with Lipitor and Zetia.  Needs follow-up labs.  He declines today.  Was nonfasting today.  He does agree to setting up follow-up as soon as he gets back from [place] and will get fasting labs at that point  #4 B12 deficiency.  Get back on daily oral B12.  Consider follow-up B12 level at follow-up.   No follow-ups on file.    Carolann Littler, MD

## 2022-10-18 NOTE — Patient Instructions (Signed)
Start back B12 1,000 mcg daily  A1C has improved 7.3%  Please set up follow up as soon as you get back and we will get some labs.

## 2022-11-04 ENCOUNTER — Other Ambulatory Visit: Payer: Self-pay | Admitting: Family Medicine

## 2022-11-05 ENCOUNTER — Other Ambulatory Visit: Payer: Self-pay | Admitting: Family Medicine

## 2022-11-07 NOTE — Telephone Encounter (Signed)
Should be taking the metformin in addition to the [facility]

## 2022-11-11 ENCOUNTER — Telehealth: Payer: Self-pay | Admitting: Family Medicine

## 2022-11-11 NOTE — Telephone Encounter (Signed)
FYI:  Pt c/o intermittent numbness on left side of face & slurred speech  Triage Nurse recommended Pt go to ED immediately - Possible TIA   Pt is refusing to go.  Please advise.

## 2022-11-11 NOTE — Telephone Encounter (Signed)
I have attempted to contact the patient x3 with voice message left informing the patient to seek emergency care in regards to symptoms mentioned in triage note

## 2022-11-11 NOTE — Telephone Encounter (Signed)
Left a message for the patent to return my cal. Also advised patient to seek emergency care asap in regards to triage note

## 2022-11-18 ENCOUNTER — Ambulatory Visit: Payer: Managed Care, Other (non HMO) | Admitting: Family Medicine

## 2022-11-18 ENCOUNTER — Encounter: Payer: Self-pay | Admitting: Family Medicine

## 2022-11-18 VITALS — BP 130/60 | HR 65 | Ht 70.0 in | Wt 213.7 lb

## 2022-11-18 DIAGNOSIS — G459 Transient cerebral ischemic attack, unspecified: Secondary | ICD-10-CM

## 2022-11-18 DIAGNOSIS — I739 Peripheral vascular disease, unspecified: Secondary | ICD-10-CM

## 2022-11-18 DIAGNOSIS — I10 Essential (primary) hypertension: Secondary | ICD-10-CM

## 2022-11-18 DIAGNOSIS — E7801 Familial hypercholesterolemia: Secondary | ICD-10-CM

## 2022-11-18 LAB — LIPID PANEL
Cholesterol: 155 mg/dL (ref 0–200)
HDL: 30.1 mg/dL — ABNORMAL LOW (ref >39.00)
LDL Cholesterol: 91 mg/dL (ref 0–99)
NonHDL: 125.24
Total CHOL/HDL Ratio: 5
Triglycerides: 171 mg/dL — ABNORMAL HIGH (ref 0.0–149.0)
VLDL: 34.2 mg/dL (ref 0.0–40.0)

## 2022-11-18 LAB — COMPREHENSIVE METABOLIC PANEL
ALT: 21 U/L (ref 0–53)
AST: 13 U/L (ref 0–37)
Albumin: 4.5 g/dL (ref 3.5–5.2)
Alkaline Phosphatase: 89 U/L (ref 39–117)
BUN: 18 mg/dL (ref 6–23)
CO2: 27 mEq/L (ref 19–32)
Calcium: 9.4 mg/dL (ref 8.4–10.5)
Chloride: 103 mEq/L (ref 96–112)
Creatinine, Ser: 0.86 mg/dL (ref 0.40–1.50)
GFR: 96.87 mL/min (ref >60.00)
Glucose, Bld: 128 mg/dL — ABNORMAL HIGH (ref 70–99)
Potassium: 4.3 mEq/L (ref 3.5–5.1)
Sodium: 138 mEq/L (ref 135–145)
Total Bilirubin: 0.4 mg/dL (ref 0.2–1.2)
Total Protein: 7.1 g/dL (ref 6.0–8.3)

## 2022-11-18 MED ORDER — TRULICITY 0.75 MG/0.5ML ~~LOC~~ SOAJ: 0.7500 mg | SUBCUTANEOUS | 5 refills | Status: DC

## 2022-11-18 NOTE — Progress Notes (Unsigned)
Established Patient Office Visit  Subjective   Patient ID: Jason [name], male    DOB: 14-Aug-1966  Age: 57 y.o. MRN: [id number]  Chief Complaint  Patient presents with   episode of numbness on left isde of face     HPI  {History (Optional):23778} [place] has chronic problems including history of hypertension, peripheral vascular disease, sleep apnea, GERD, type 2 diabetes, chronic sensory neuropathy, osteoarthritis involving multiple joints hyperlipidemia, remote history of alcohol abuse, ongoing nicotine use.  He states he had 3 episodes recently of very transient left sided facial numbness with some paresthesias of the left hand and slurred speech.  Each episode lasted less than an hour.  Most recent episode a couple days ago.  He was advised to go to the ER immediately or call 911.  He declined going.  Denies any symptoms today.  No recent headaches.  No history of migraines.  No right upper or lower extremity symptoms.  Not have any visual changes except for peripheral vision on the left side seemed "slightly fuzzy "for only minutes.  Multiple risk factors for vascular disease as above.  Does still smoke about a pack and half to 2 packs a cigarettes per day.  He is currently in process of trying to get Trulicity but has had some insurance issues.  Mains on metformin for diabetes.  Takes atorvastatin 40 mg daily for hyperlipidemia along with Zetia.  Blood pressures have been stable.  Recent A1c 7.3% but not consistently taking Trulicity at that time.  No history of known atrial fibrillation.  No recent chest pains.  No cognitive changes.  Past Medical History:  Diagnosis Date   Anxiety    Depression    Diabetes mellitus without complication (HCC)    GERD (gastroesophageal reflux disease)    Headache(784.0)    Hyperlipidemia    Hypertension    MRSA infection    Left shin, back   Nerve damage    BLE   Neuromuscular disorder ([place])    Osteoarthritis    Peripheral vascular  disease ([place])    Pneumonia    Past Surgical History:  Procedure Laterality Date   ABDOMINAL AORTOGRAM W/LOWER EXTREMITY N/A 12/01/2017   Procedure: ABDOMINAL AORTOGRAM W/LOWER EXTREMITY;  Surgeon: Elam Dutch, MD;  Location: [place] CV LAB;  Service: Cardiovascular;  Laterality: N/A;   ABDOMINAL AORTOGRAM W/LOWER EXTREMITY Bilateral 07/21/2021   Procedure: ABDOMINAL AORTOGRAM W/LOWER EXTREMITY;  Surgeon: Broadus John, MD;  Location: [place] CV LAB;  Service: Cardiovascular;  Laterality: Bilateral;   FEMORAL-POPLITEAL BYPASS GRAFT Right 12/11/2017   Procedure: BYPASS GRAFT RIGHT FEMORAL-ABOVE KNEE POPLITEAL ARTERY with Non Reversed Greater Saphenous Vein and Angioplasty distal vein Graft.;  Surgeon: Elam Dutch, MD;  [facility] OR;  Service: Vascular;  Laterality: Right;   PERIPHERAL VASCULAR BALLOON ANGIOPLASTY Right 12/01/2017   Procedure: PERIPHERAL VASCULAR BALLOON ANGIOPLASTY;  Surgeon: Elam Dutch, MD;  Location: [place] CV LAB;  Service: Cardiovascular;  Laterality: Right;  superficial femoral, attempted   PERIPHERAL VASCULAR INTERVENTION Left 12/01/2017   Procedure: PERIPHERAL VASCULAR INTERVENTION;  Surgeon: Elam Dutch, MD;  Location: [place] CV LAB;  Service: Cardiovascular;  Laterality: Left;  External iliac   PERIPHERAL VASCULAR INTERVENTION Left 07/21/2021   Procedure: PERIPHERAL VASCULAR INTERVENTION;  Surgeon: Broadus John, MD;  Location: [place] CV LAB;  Service: Cardiovascular;  Laterality: Left;   ROTATOR CUFF REPAIR     TONSILLECTOMY     VEIN HARVEST Right 12/11/2017  Procedure: RIGHT GREATER SAPHENOUS VEIN HARVEST;  Surgeon: Elam Dutch, MD;  Location: [place];  Service: Vascular;  Laterality: Right;    reports that he has been smoking cigarettes. He has a 50.00 pack-year smoking history. He has never used smokeless tobacco. He reports that he does not currently use alcohol after a past usage of about 20.0 standard drinks of  alcohol per week. He reports that he does not use drugs. family history includes Arthritis in his mother; Asthma in his maternal grandfather; Heart attack (age of onset: 36) in his father; Heart disease in an other family member; Hypertension in his father; Mental illness in an other family member; Stroke in his paternal grandfather. Allergies  Allergen Reactions   Olmesartan Medoxomil Other (See Comments)    dizziness    Review of Systems  Constitutional:  Negative for chills, fever, malaise/fatigue and weight loss.  Eyes:        See HPI  Respiratory:  Negative for shortness of breath.   Cardiovascular:  Negative for chest pain.  Gastrointestinal:  Negative for abdominal pain.  Genitourinary:  Negative for dysuria.  Neurological:  Positive for sensory change and speech change. Negative for dizziness, seizures, loss of consciousness, weakness and headaches.      Objective:     BP 130/60   Pulse 65   Ht 5\' 10"  (1.778 m)   Wt 213 lb 11.2 oz (96.9 kg)   SpO2 98%   BMI 30.66 kg/m  {Vitals History (Optional):23777}  Physical Exam Vitals reviewed.  Constitutional:      Appearance: Normal appearance.  Eyes:     Extraocular Movements: Extraocular movements intact.     Pupils: Pupils are equal, round, and reactive to light.  Neck:     Comments: No carotid bruits auscultated Cardiovascular:     Rate and Rhythm: Normal rate and regular rhythm.  Pulmonary:     Effort: Pulmonary effort is normal.     Breath sounds: Normal breath sounds.  Musculoskeletal:     Cervical back: Neck supple.  Neurological:     General: No focal deficit present.     Mental Status: He is alert and oriented to person, place, and time.     Cranial Nerves: No cranial nerve deficit.     Motor: No weakness.     Coordination: Coordination normal.     Gait: Gait normal.  Psychiatric:        Mood and Affect: Mood normal.        Behavior: Behavior normal.        Thought Content: Thought content normal.         Judgment: Judgment normal.      No results found for any visits on 11/18/22.  {Labs (Optional):23779}  The 10-year ASCVD risk score (Arnett DK, et al., 2019) is: 24.8%    Assessment & Plan:   Problem List Items Addressed This Visit       Unprioritized   PAD (peripheral artery disease) ([place])   Hyperlipidemia   Relevant Orders   Lipid panel   CMP   Essential hypertension, benign - Primary    Return in about 3 months (around 02/18/2023).    Carolann Littler, MD

## 2022-11-23 ENCOUNTER — Telehealth: Payer: Self-pay | Admitting: Family Medicine

## 2022-11-23 MED ORDER — DIAZEPAM 5 MG PO TABS: ORAL_TABLET | ORAL | 0 refills | Status: DC

## 2022-11-23 NOTE — Telephone Encounter (Signed)
Pt having MRI and CT of neck and head Friday and sunday. Not sure if patient is going to need something to help him with his claustrophobia.  Pt aware provider is out of office  [id number] [place] [place]. Phone: [phone]  Fax: [phone]

## 2022-11-23 NOTE — Addendum Note (Signed)
Addended by: Alysia Penna A on: 11/23/2022 12:49 PM   Modules accepted: Orders

## 2022-11-23 NOTE — Telephone Encounter (Signed)
ATC  [place],Pamela [phone]  No voicemail came on to leave message.

## 2022-11-23 NOTE — Telephone Encounter (Signed)
Done

## 2022-11-24 ENCOUNTER — Telehealth: Payer: Self-pay | Admitting: Family Medicine

## 2022-11-24 NOTE — Telephone Encounter (Signed)
Needs order for MRI of brain w/o contrast   CTA of head w/contrast  CTA neck w/contrast faxed to [phone]. Was not covered at initial facility

## 2022-11-24 NOTE — Telephone Encounter (Addendum)
[facility] [name] preservice is calling and CT ANGIO NECK W OR WO CONTRAST,   and MR BRAIN WO CONTRAST is no longer authorized for [name] facility. Please call Jason back. Pt is sch for 3-29 and 11-27-2022

## 2022-11-24 NOTE — Telephone Encounter (Signed)
Auth is changed to correct facility. Listed under authorization in referral.

## 2022-11-25 ENCOUNTER — Ambulatory Visit (HOSPITAL_COMMUNITY): Payer: Managed Care, Other (non HMO)

## 2022-11-26 ENCOUNTER — Ambulatory Visit (HOSPITAL_COMMUNITY)
Admission: RE | Admit: 2022-11-26 | Discharge: 2022-11-26 | Disposition: A | Discharge status: Home / Self Care | Payer: Managed Care, Other (non HMO) | Source: Ambulatory Visit | Attending: Family Medicine | Admitting: Family Medicine

## 2022-11-26 NOTE — Progress Notes (Signed)
Pt scheduled for MRI brain as an OP today (Saturday). Researching pt devices revealed pt has 2 [employer] stents in his LLE. MRI Safety conditions for those stents allow for overlapping stents up to 274mm. Pt has 2 stents with max length 226mm. Called [facility] and spoke with Rad Dr. Shearon Stalls who said NOT to proceed with exam due to exceeding recommended safety conditions. Pt was contacted and told MRI appt needed to be cancelled and reasons explained as to why. Pt told to follow-up with MD regarding other imaging alternatives. Pt verbalized understanding.

## 2022-11-27 ENCOUNTER — Encounter: Payer: Self-pay | Admitting: Family Medicine

## 2022-11-27 ENCOUNTER — Other Ambulatory Visit: Payer: Self-pay | Admitting: Family Medicine

## 2022-11-27 ENCOUNTER — Ambulatory Visit (HOSPITAL_BASED_OUTPATIENT_CLINIC_OR_DEPARTMENT_OTHER)
Admission: RE | Admit: 2022-11-27 | Discharge: 2022-11-27 | Disposition: A | Discharge status: Home / Self Care | Payer: Managed Care, Other (non HMO) | Source: Ambulatory Visit | Attending: Family Medicine | Admitting: Family Medicine

## 2022-11-27 ENCOUNTER — Other Ambulatory Visit: Payer: Self-pay | Admitting: Family

## 2022-11-27 DIAGNOSIS — G459 Transient cerebral ischemic attack, unspecified: Secondary | ICD-10-CM

## 2022-11-27 DIAGNOSIS — E7801 Familial hypercholesterolemia: Secondary | ICD-10-CM

## 2022-11-27 DIAGNOSIS — I6521 Occlusion and stenosis of right carotid artery: Secondary | ICD-10-CM

## 2022-11-27 DIAGNOSIS — I739 Peripheral vascular disease, unspecified: Secondary | ICD-10-CM

## 2022-11-27 DIAGNOSIS — I10 Essential (primary) hypertension: Secondary | ICD-10-CM

## 2022-11-27 MED ORDER — IOHEXOL 350 MG/ML SOLN
100.0000 mL | Freq: Once | INTRAVENOUS | Status: AC | PRN
  Administered 2022-11-27: 75 mL via INTRAVENOUS

## 2022-11-28 ENCOUNTER — Telehealth: Payer: Self-pay | Admitting: Family Medicine

## 2022-11-28 NOTE — Telephone Encounter (Signed)
Noted  

## 2022-11-28 NOTE — Telephone Encounter (Signed)
Pt wife is calling and she FYI want md to know pt had ct scan and will be seeing vascular md

## 2022-11-30 NOTE — Telephone Encounter (Signed)
Patient's spouse calling requesting clarification as to what is going on with this request. Says the MRI has not been scheduled and she is concerned. Pls reach out to patient.

## 2022-12-01 ENCOUNTER — Other Ambulatory Visit: Payer: Self-pay | Admitting: *Deleted

## 2022-12-01 DIAGNOSIS — I70222 Atherosclerosis of native arteries of extremities with rest pain, left leg: Secondary | ICD-10-CM

## 2022-12-01 DIAGNOSIS — I739 Peripheral vascular disease, unspecified: Secondary | ICD-10-CM

## 2022-12-02 ENCOUNTER — Other Ambulatory Visit (HOSPITAL_COMMUNITY): Payer: Self-pay | Admitting: Vascular Surgery

## 2022-12-02 ENCOUNTER — Ambulatory Visit (HOSPITAL_COMMUNITY)
Admission: RE | Admit: 2022-12-02 | Discharge: 2022-12-02 | Disposition: A | Discharge status: Home / Self Care | Payer: Managed Care, Other (non HMO) | Source: Ambulatory Visit | Attending: Vascular Surgery | Admitting: Vascular Surgery

## 2022-12-02 ENCOUNTER — Ambulatory Visit (INDEPENDENT_AMBULATORY_CARE_PROVIDER_SITE_OTHER)
Admission: RE | Admit: 2022-12-02 | Discharge: 2022-12-02 | Disposition: A | Discharge status: Home / Self Care | Payer: Managed Care, Other (non HMO) | Source: Ambulatory Visit | Attending: Vascular Surgery | Admitting: Vascular Surgery

## 2022-12-02 ENCOUNTER — Encounter (HOSPITAL_COMMUNITY): Payer: Self-pay

## 2022-12-02 DIAGNOSIS — I739 Peripheral vascular disease, unspecified: Secondary | ICD-10-CM

## 2022-12-02 DIAGNOSIS — I70222 Atherosclerosis of native arteries of extremities with rest pain, left leg: Secondary | ICD-10-CM | POA: Insufficient documentation

## 2022-12-02 DIAGNOSIS — R29810 Facial weakness: Secondary | ICD-10-CM

## 2022-12-02 NOTE — Telephone Encounter (Signed)
Spoke with spouse and she said she will give me a call back on Monday to schedule appt.

## 2022-12-04 ENCOUNTER — Encounter: Payer: Self-pay | Admitting: Vascular Surgery

## 2022-12-19 ENCOUNTER — Ambulatory Visit: Payer: Managed Care, Other (non HMO) | Admitting: Family Medicine

## 2022-12-19 ENCOUNTER — Encounter: Payer: Self-pay | Admitting: Family Medicine

## 2022-12-19 VITALS — BP 144/68 | HR 79 | Temp 98.4°F | Ht 70.0 in | Wt 214.6 lb

## 2022-12-19 DIAGNOSIS — H9202 Otalgia, left ear: Secondary | ICD-10-CM | POA: Diagnosis not present

## 2022-12-19 DIAGNOSIS — H6122 Impacted cerumen, left ear: Secondary | ICD-10-CM | POA: Diagnosis not present

## 2022-12-19 DIAGNOSIS — H6992 Unspecified Eustachian tube disorder, left ear: Secondary | ICD-10-CM

## 2022-12-19 MED ORDER — AMOXICILLIN 500 MG PO TABS
500.0000 mg | ORAL_TABLET | Freq: Two times a day (BID) | ORAL | 0 refills | Status: AC
Start: 2022-12-19 — End: 2022-12-26

## 2022-12-19 NOTE — Patient Instructions (Signed)
You can use over-the-counter Debrox eardrops to help loosen the wax that is in your ear.  Because of your level of discomfort we will start antibiotic for presumed ear infection.  You can also try using an over-the-counter antihistamine such as Allegra, Zyrtec, Claritin, Xyzal, or Flonase nasal spray to help with your symptoms.

## 2022-12-19 NOTE — Progress Notes (Signed)
Established Patient Office Visit   Subjective  Patient ID: Jason [name], male    DOB: [birth year]  Age: 57 y.o. MRN: [id number]  Chief Complaint  Patient presents with   Ear Pain    Pt c/o L ear pain. Decrease in hearing. Noticed sx on Saturday.    Patient is a 57 year old male with pmh sig for history of anxiety, depression, GERD, HLD, HTN, OSA, PVD, DM2, tobacco use who is followed by Dr. Caryl Never and seen for acute concern.  Patient endorses left ear pain with radiation to the neck, decreased hearing.  Symptoms started 3 days ago on Sunday.  Denies fever, dental pain, facial pressure, headaches.  Has not taken anything for symptoms.    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 03/10/2020   Primary osteoarthritis of right hip 07/12/2019   Avascular necrosis 06/15/2019   PAD (peripheral artery disease) 12/11/2017   Preoperative cardiovascular examination 12/05/2017   Peripheral arterial disease 10/23/2017   History of MRSA infection 10/23/2017   Intermittent claudication 10/17/2017   Herniated cervical intervertebral disc 08/01/2017   Osteoarthritis of spine 08/01/2017   Poorly controlled type 2 diabetes mellitus with circulatory disorder 07/11/2017   Polyarthralgia 06/27/2017   Cervical pain (neck) 06/27/2017   Primary osteoarthritis of both knees 05/02/2017   Sensory neuronopathy 01/14/2017   Transaminasemia 10/10/2016   Vitamin B 12 deficiency 09/19/2016   Hyperglycemia 09/19/2016   Chest pressure 07/04/2012   Cough 07/04/2012   Hyperlipidemia 10/12/2010   ANXIETY 10/12/2010   History of alcohol abuse 10/12/2010   TOBACCO USE 10/12/2010   DEPRESSION 10/12/2010   Essential hypertension, benign 10/12/2010   GERD 10/12/2010   Osteoarthritis  10/12/2010   Sleep apnea 10/12/2010   Past Surgical History:  Procedure Laterality Date   ABDOMINAL AORTOGRAM W/LOWER EXTREMITY N/A 12/01/2017   Procedure: ABDOMINAL AORTOGRAM W/LOWER EXTREMITY;  Surgeon: Sherren Kerns, MD;  Location: [facility] INVASIVE CV LAB;  Service: Cardiovascular;  Laterality: N/A;   ABDOMINAL AORTOGRAM W/LOWER EXTREMITY Bilateral 07/21/2021   Procedure: ABDOMINAL AORTOGRAM W/LOWER EXTREMITY;  Surgeon: Victorino Sparrow, MD;  Location: [facility] INVASIVE CV LAB;  Service: Cardiovascular;  Laterality: Bilateral;   FEMORAL-POPLITEAL BYPASS GRAFT Right 12/11/2017   Procedure: BYPASS GRAFT RIGHT FEMORAL-ABOVE KNEE POPLITEAL ARTERY with Non Reversed Greater Saphenous Vein and Angioplasty distal vein Graft.;  Surgeon: Sherren Kerns, MD;  Location: [facility] OR;  Service: Vascular;  Laterality: Right;   PERIPHERAL VASCULAR BALLOON ANGIOPLASTY Right 12/01/2017   Procedure: PERIPHERAL VASCULAR BALLOON ANGIOPLASTY;  Surgeon: Sherren Kerns, MD;  Location: [facility] INVASIVE CV [facility];  Service: Cardiovascular;  Laterality: Right;  superficial femoral, attempted   PERIPHERAL VASCULAR INTERVENTION Left 12/01/2017   Procedure: PERIPHERAL VASCULAR INTERVENTION;  Surgeon: Sherren Kerns, MD;  Location: [facility] INVASIVE CV LAB;  Service: Cardiovascular;  Laterality: Left;  External iliac   PERIPHERAL VASCULAR INTERVENTION Left 07/21/2021   Procedure: PERIPHERAL VASCULAR INTERVENTION;  Surgeon: Victorino Sparrow, MD;  Location: [facility] INVASIVE CV LAB;  Service: Cardiovascular;  Laterality: Left;   ROTATOR CUFF REPAIR     TONSILLECTOMY     VEIN HARVEST Right 12/11/2017   Procedure: RIGHT GREATER SAPHENOUS VEIN HARVEST;  Surgeon: Sherren Kerns, MD;  Location: [facility] OR;  Service: Vascular;  Laterality: Right;   Social History   Tobacco  Use   Smoking status: Every Day    Packs/day: 2.00    Years: 25.00    Additional pack years: 0.00    Total pack years: 50.00    Types: Cigarettes   Smokeless tobacco: Never   Tobacco  comments:    30 cigarettes per day  Vaping Use   Vaping Use: Never used  Substance Use Topics   Alcohol use: Not Currently    Alcohol/week: 20.0 standard drinks of alcohol    Types: 20 Glasses of wine per week    Comment: not since Jan 2018   Drug use: No   Family History  Problem Relation Age of Onset   Heart attack Father 23   Hypertension Father    Arthritis Mother    Asthma Maternal Grandfather    Stroke Paternal Grandfather    Heart disease Other    Mental illness Other    Cancer Neg Hx    Diabetes Neg Hx    Drug abuse Neg Hx    Early death Neg Hx    Hearing loss Neg Hx    Hyperlipidemia Neg Hx    Allergies  Allergen Reactions   Olmesartan Medoxomil Other (See Comments)    dizziness      ROS Negative unless stated above    Objective:     BP (!) 144/68 (BP Location: Left Arm, Patient Position: Sitting, Cuff Size: Large)   Pulse 79   Temp 98.4 F (36.9 C) (Oral)   Ht  (1.778 m)   Wt 214 lb 9.6 oz (97.3 kg)   SpO2 96%   BMI 30.79 kg/m    Physical Exam Constitutional:      General: He is not in acute distress.    Appearance: Normal appearance.  HENT:     Head: Normocephalic and atraumatic.     Right Ear: Hearing, tympanic membrane, ear canal and external ear normal.     Left Ear: Ear canal and external ear normal. Decreased hearing noted. Tenderness present. There is impacted cerumen.     Nose: Nose normal.     Mouth/Throat:     Mouth: Mucous membranes are moist.  Cardiovascular:     Rate and Rhythm: Normal rate and regular rhythm.     Heart sounds: Normal heart sounds. No murmur heard.    No gallop.  Pulmonary:     Effort: Pulmonary effort is normal. No respiratory distress.     Breath sounds: Normal breath sounds. No wheezing, rhonchi or rales.  Skin:    General: Skin is warm and dry.  Neurological:     Mental Status: He is alert and oriented to person, place, and time.      No results found for any visits on 12/19/22.     Assessment & Plan:  Impacted cerumen of left ear  Dysfunction of left eustachian tube  Left ear pain -     Amoxicillin; Take 1 tablet (500 mg total) by mouth 2 (two) times daily for 7 days.  Dispense: 14 tablet; Refill: 0  Consent obtained.  Cerumen removed from left ear with curette and alligator forceps by this provider.  Limited ability to fully remove wax and visualized TM due to patient discomfort.  Given discomfort start ABX.  OTC Debrox eardrops and antihistamine.  No follow-ups on file.   Deeann Saint, MD

## 2022-12-23 ENCOUNTER — Ambulatory Visit (HOSPITAL_COMMUNITY)
Admission: RE | Admit: 2022-12-23 | Discharge: 2022-12-23 | Disposition: A | Discharge status: Home / Self Care | Payer: Managed Care, Other (non HMO) | Source: Ambulatory Visit | Attending: Vascular Surgery

## 2022-12-23 ENCOUNTER — Ambulatory Visit: Payer: Managed Care, Other (non HMO) | Admitting: Vascular Surgery

## 2022-12-23 ENCOUNTER — Encounter: Payer: Self-pay | Admitting: Vascular Surgery

## 2022-12-23 ENCOUNTER — Ambulatory Visit (HOSPITAL_BASED_OUTPATIENT_CLINIC_OR_DEPARTMENT_OTHER)
Admission: RE | Admit: 2022-12-23 | Discharge: 2022-12-23 | Disposition: A | Discharge status: Home / Self Care | Payer: Managed Care, Other (non HMO) | Source: Ambulatory Visit | Attending: Internal Medicine | Admitting: Internal Medicine

## 2022-12-23 VITALS — BP 158/73 | HR 66 | Temp 97.7°F | Resp 20 | Ht 70.0 in | Wt 212.8 lb

## 2022-12-23 DIAGNOSIS — I739 Peripheral vascular disease, unspecified: Secondary | ICD-10-CM

## 2022-12-23 DIAGNOSIS — G459 Transient cerebral ischemic attack, unspecified: Secondary | ICD-10-CM

## 2022-12-23 DIAGNOSIS — Z95820 Peripheral vascular angioplasty status with implants and grafts: Secondary | ICD-10-CM | POA: Insufficient documentation

## 2022-12-23 DIAGNOSIS — Z95828 Presence of other vascular implants and grafts: Secondary | ICD-10-CM | POA: Diagnosis not present

## 2022-12-23 LAB — VAS US ABI WITH/WO TBI
Left ABI: 1.06
Right ABI: 0.99

## 2022-12-23 MED ORDER — CLOPIDOGREL BISULFATE 75 MG PO TABS: 75.0000 mg | ORAL_TABLET | Freq: Every day | ORAL | 3 refills | Status: DC

## 2022-12-23 MED ORDER — ASPIRIN 81 MG PO TBEC: 81.0000 mg | DELAYED_RELEASE_TABLET | Freq: Every day | ORAL | 12 refills | Status: DC

## 2022-12-23 NOTE — Progress Notes (Signed)
Office Note     HPI: Jason [name] is a 57 y.o. (1966/01/05) male presenting in follow up s/p 07/21/21 LLE SFA/Pop stenting for lifestyle-limiting claudication. Pt also has a history of 11/2017 right femoral to above-knee popliteal bypass with vein, left external iliac artery stent with Dr. Darrick Penna.  He immigrated to the [place] from [place] 1999.  Exam today, he was doing well.  He had no complaints.  He is back to work without any medication symptoms.  He continues to smoke, but is working on weaning.  The pt is  on a statin for cholesterol management.  The pt is not on a daily aspirin.   Other AC:  plavix The pt is  on medication for hypertension.   The pt is diabetic.  Tobacco hx:  Daily  Past Medical History:  Diagnosis Date   Anxiety    Depression    Diabetes mellitus without complication (HCC)    GERD (gastroesophageal reflux disease)    Headache(784.0)    Hyperlipidemia    Hypertension    MRSA infection    Left shin, back   Nerve damage    BLE   Neuromuscular disorder (HCC)    Osteoarthritis    Peripheral vascular disease (HCC)    Pneumonia     Past Surgical History:  Procedure Laterality Date   ABDOMINAL AORTOGRAM W/LOWER EXTREMITY N/A 12/01/2017   Procedure: ABDOMINAL AORTOGRAM W/LOWER EXTREMITY;  Surgeon: Sherren Kerns, MD;  Location: [facility] INVASIVE CV LAB;  Service: Cardiovascular;  Laterality: N/A;   ABDOMINAL AORTOGRAM W/LOWER EXTREMITY Bilateral 07/21/2021   Procedure: ABDOMINAL AORTOGRAM W/LOWER EXTREMITY;  Surgeon: Victorino Sparrow, MD;  Location: [facility] INVASIVE CV LAB;  Service: Cardiovascular;  Laterality: Bilateral;   FEMORAL-POPLITEAL BYPASS GRAFT Right 12/11/2017   Procedure: BYPASS GRAFT RIGHT FEMORAL-ABOVE KNEE POPLITEAL ARTERY with Non Reversed Greater Saphenous Vein and Angioplasty distal vein Graft.;  Surgeon: Sherren Kerns, MD;  Location: [facility] OR;  Service: Vascular;  Laterality: Right;   PERIPHERAL VASCULAR BALLOON ANGIOPLASTY Right  12/01/2017   Procedure: PERIPHERAL VASCULAR BALLOON ANGIOPLASTY;  Surgeon: Sherren Kerns, MD;  Location: [facility] INVAS[facility] CV [facility];  Service: Cardiovascular;  Laterality: Right;  superficial femoral, attempted   PERIPHERAL VASCULAR INTERVENTION Left 12/01/2017   Procedure: PERIPHERAL VASCULAR INTERVENTION;  Surgeon: Sherren Kerns, MD;  Location: [facility] INVAS[facility] CV [facility];  Service: Cardiovascular;  Laterality: Left;  External iliac   PERIPHERAL VASCULAR INTERVENTION Left 07/21/2021   Procedure: PERIPHERAL VASCULAR INTERVENTION;  Surgeon: Victorino Sparrow, MD;  Location: [facility] INVASIVE CV LAB;  Service: Cardiovascular;  Laterality: Left;   ROTATOR CUFF REPAIR     TONSILLECTOMY     VEIN HARVEST Right 12/11/2017   Procedure: RIGHT GREATER SAPHENOUS VEIN HARVEST;  Surgeon: Sherren Kerns, MD;  Location: [facility] OR;  Service: Vascular;  Laterality: Right;    Social History   Socioeconomic History   Marital status: Married    Spouse name: Not on file   Number of children: Not on file   Years of education: Not on file   Highest education level: Not on file  Occupational History   Not on file  Tobacco Use   Smoking status: Every Day    Packs/day: 2.00    Years: 25.00    Additional pack years: 0.00    Total pack years: 50.00    Types: Cigarettes   Smokeless tobacco: Never   Tobacco comments:    30 cigarettes per day  Vaping Use   Vaping Use:  Never used  Substance and Sexual Activity   Alcohol use: Not Currently    Alcohol/week: 20.0 standard drinks of alcohol    Types: 20 Glasses of wine per week    Comment: not since Jan 2018   Drug use: No   Sexual activity: Not Currently  Other Topics Concern   Not on file  Social History Narrative   Not on file   Social Determinants of Health   Financial Resource Strain: Not on file  Food Insecurity: Not on file  Transportation Needs: Not on file  Physical Activity: Not on file  Stress: Not on file  Social Connections: Not on file  Intimate Partner  Violence: Not on file    Family History  Problem Relation Age of Onset   Heart attack Father 70   Hypertension Father    Arthritis Mother    Asthma Maternal Grandfather    Stroke Paternal Grandfather    Heart disease Other    Mental illness Other    Cancer Neg Hx    Diabetes Neg Hx    Drug abuse Neg Hx    Early death Neg Hx    Hearing loss Neg Hx    Hyperlipidemia Neg Hx     Current Outpatient Medications  Medication Sig Dispense Refill   amoxicillin (AMOXIL) 500 MG tablet Take 1 tablet (500 mg total) by mouth 2 (two) times daily for 7 days. 14 tablet 0   atorvastatin (LIPITOR) 40 MG tablet TAKE 1 TABLET (40 MG TOTAL) BY MOUTH DAILY. *LABS REQUIRED FOR REFILLS 90 tablet 0   clopidogrel (PLAVIX) 75 MG tablet TAKE 1 TABLET BY MOUTH EVERY DAY 90 tablet 3   CONTOUR NEXT TEST test strip TEST GLUCOSE ONCE DAILY. DX E 11.9 100 strip 3   diclofenac (VOLTAREN) 75 MG EC tablet TAKE 1 TABLET BY MOUTH TWICE A DAY 60 tablet 3   Dulaglutide (TRULICITY) 0.75 MG/0.5ML SOPN Inject 0.75 mg into the skin once a week. 2 mL 5   ezetimibe (ZETIA) 10 MG tablet TAKE 1 TABLET BY MOUTH EVERY DAY 90 tablet 0   Lancets MISC Contour Lancets.  Test glucose once daily. DxE11.9 100 each 3   metFORMIN (GLUCOPHAGE) 500 MG tablet TAKE 2 TABLETS BY MOUTH TWICE A DAY. INSURANCE ONLY PAYS FOR 90 DAYS NEEDS DOCTOR APPT 360 tablet 0   diazepam (VALIUM) 5 MG tablet Take one tablet 30 minutes before procedures (Patient not taking: Reported on 12/19/2022) 10 tablet 0   gabapentin (NEURONTIN) 300 MG capsule TAKE ONE CAPSULE BY MOUTH EVERY MORNING AND TAKE 2 CAPSULES BY MOUTH AT BEDTIME (Patient not taking: Reported on 12/19/2022) 270 capsule 0   LORazepam (ATIVAN) 0.5 MG tablet Take one to two tablets about one hour prior to flight. (Patient not taking: Reported on 12/19/2022) 20 tablet 0   No current facility-administered medications for this visit.    Allergies  Allergen Reactions   Olmesartan Medoxomil Other (See  Comments)    dizziness     REVIEW OF SYSTEMS:   [X]  denotes positive finding, [ ]  denotes negative finding Cardiac  Comments:  Chest pain or chest pressure:    Shortness of breath upon exertion:    Short of breath when lying flat:    Irregular heart rhythm:        Vascular    Pain in calf, thigh, or hip brought on by ambulation:    Pain in feet at night that wakes you up from your sleep:     Blood  clot in your veins:    Leg swelling:         Pulmonary    Oxygen at home:    Productive cough:     Wheezing:         Neurologic    Sudden weakness in arms or legs:     Sudden numbness in arms or legs:     Sudden onset of difficulty speaking or slurred speech:    Temporary loss of vision in one eye:     Problems with dizziness:         Gastrointestinal    Blood in stool:     Vomited blood:         Genitourinary    Burning when urinating:     Blood in urine:        Psychiatric    Major depression:         Hematologic    Bleeding problems:    Problems with blood clotting too easily:        Skin    Rashes or ulcers:        Constitutional    Fever or chills:      PHYSICAL EXAMINATION:  Vitals:   12/23/22 1041 12/23/22 1043  BP: 139/73 (!) 158/73  Pulse: 66   Resp: 20   Temp: 97.7 F (36.5 C)   SpO2: 96%   Weight: 212 lb 12.8 oz (96.5 kg)   Height: 5\' 10"  (1.778 m)     General:  WDWN in NAD; vital signs documented above Gait: Not observed HENT: WNL, normocephalic Pulmonary: normal non-labored breathing , without Rales, rhonchi,  wheezing Cardiac: regular HR Abdomen: soft, NT, no masses Skin: without rashes Vascular Exam/Pulses:  Right Left  Radial 2+ (normal) 2+ (normal)  Ulnar 2+ (normal) 2+ (normal)  Femoral 2+ (normal) 2+ (normal)      DP 2+ 2+  PT 2+ (normal) 2+   Extremities: without ischemic changes, without Gangrene , without cellulitis; without open wounds;  Musculoskeletal: no muscle wasting or atrophy  Neurologic: A&O X 3;  No focal  weakness or paresthesias are detected Psychiatric:  The pt has Normal affect.   Non-Invasive Vascular Imaging:     +-------+-----------+-----------+------------+------------+  ABI/TBIToday's ABIToday's TBIPrevious ABIPrevious TBI  +-------+-----------+-----------+------------+------------+  Right  0.95       0.63       0.99        0.79          +-------+-----------+-----------+------------+------------+  Left   1.07       0.61       0.48        0.25          +-------+-----------+-----------+------------+------------+   +-----------+--------+-----+--------+---------+--------+  LEFT       PSV cm/sRatioStenosisWaveform Comments  +-----------+--------+-----+--------+---------+--------+  CFA Distal 142                  triphasic          +-----------+--------+-----+--------+---------+--------+  DFA        182                  biphasic           +-----------+--------+-----+--------+---------+--------+  SFA Prox                                 stent     +-----------+--------+-----+--------+---------+--------+  SFA Mid  stent     +-----------+--------+-----+--------+---------+--------+  SFA Distal 106                  triphasic          +-----------+--------+-----+--------+---------+--------+  POP Prox   116                  triphasic          +-----------+--------+-----+--------+---------+--------+  POP Distal 93                   triphasic          +-----------+--------+-----+--------+---------+--------+  ATA Distal 111                  triphasic          +-----------+--------+-----+--------+---------+--------+  PTA Distal 117                  triphasic          +-----------+--------+-----+--------+---------+--------+  PERO Distal52                   triphasic          +-----------+--------+-----+--------+---------+--------+    ASSESSMENT/PLAN: Jason [name] is a 57 y.o. male presenting in follow up s/p 07/21/21 LLE SFA/Pop stenting for lifestyle-limiting claudication. Pt also has a history of 11/2017 right femoral to above-knee popliteal bypass with vein, left external iliac artery stent with Dr. Darrick Penna.  He immigrated to the [place] from [place] 1999. Patient is doing well on exam with palpable pulses in both feet.  The stent is widely patent without stenosis.  Patient would benefit from continued follow-up and smoking cessation.  I will see him back in the office in 6 months for duplex ultrasonography of his left-sided iliac stent, right femoral to above-knee popliteal artery bypass, left-sided SFA popliteal stents  Adriana several vascular problems were addressed today in clinic.  I am concerned that agent may have stump syndrome as he continues to have TIAs affecting the left face and left arm with symptoms of numbness and weakness.  I have ordered a stat neurology referral.  I plan to discuss his case with interventional neurology for possible cerebral angiogram to assess for stump syndrome.  Regarding his bilateral lower extremity peripheral arterial disease, he continues to smoke 2 packs/day, he has not been compliant with aspirin or Plavix. Left SFA stenting, right-sided bypass were not evaluated.  These need to be evaluated in the coming weeks to ensure there is no flow-limiting stenosis.  Fortunately, he has palpable dorsalis pedis arteries bilaterally.  He has unchanged  Neck plan to continue to follow the left-sided external iliac artery stents.  There is a small amount of stenosis present to reimage in 6 months.      Recommend the following which can slow the progression of atherosclerosis and reduce the risk of major adverse cardiac / limb events:  Aspirin 81mg  PO QD.  Atorvastatin 40-80mg  PO QD (or other "high intensity" statin therapy). Complete cessation from all tobacco products. Blood glucose control with goal  A1c < 7%. Blood pressure control with goal blood pressure < 140/90 mmHg. Lipid reduction therapy with goal LDL-C <100 mg/dL (<40 if symptomatic from PAD).    Victorino Sparrow, MD [facility] [phone]

## 2022-12-28 ENCOUNTER — Encounter (HOSPITAL_COMMUNITY): Payer: Self-pay

## 2022-12-28 ENCOUNTER — Other Ambulatory Visit (HOSPITAL_COMMUNITY): Payer: Self-pay | Admitting: Vascular Surgery

## 2022-12-28 ENCOUNTER — Ambulatory Visit (HOSPITAL_COMMUNITY)
Admission: RE | Admit: 2022-12-28 | Discharge: 2022-12-28 | Disposition: A | Discharge status: Home / Self Care | Payer: Managed Care, Other (non HMO) | Source: Ambulatory Visit | Attending: Cardiovascular Disease | Admitting: Cardiovascular Disease

## 2022-12-28 ENCOUNTER — Ambulatory Visit (HOSPITAL_COMMUNITY)
Admission: RE | Admit: 2022-12-28 | Discharge: 2022-12-28 | Disposition: A | Discharge status: Home / Self Care | Payer: Managed Care, Other (non HMO) | Source: Ambulatory Visit | Attending: Family Medicine | Admitting: Family Medicine

## 2022-12-28 DIAGNOSIS — I739 Peripheral vascular disease, unspecified: Secondary | ICD-10-CM

## 2022-12-28 NOTE — Progress Notes (Signed)
Per Manufacturers guidelines, overlapping stents cannot exceed to be MRI Conditional. Patient has 3 overlapping stents   = . per Manuf.  Per Dr Chales Abrahams pt is unsafe to have MRI.

## 2023-01-02 ENCOUNTER — Other Ambulatory Visit: Payer: Self-pay

## 2023-01-02 DIAGNOSIS — I739 Peripheral vascular disease, unspecified: Secondary | ICD-10-CM

## 2023-01-02 DIAGNOSIS — G459 Transient cerebral ischemic attack, unspecified: Secondary | ICD-10-CM

## 2023-01-02 DIAGNOSIS — I70222 Atherosclerosis of native arteries of extremities with rest pain, left leg: Secondary | ICD-10-CM

## 2023-01-03 ENCOUNTER — Other Ambulatory Visit: Payer: Self-pay | Admitting: Family Medicine

## 2023-01-03 DIAGNOSIS — E7801 Familial hypercholesterolemia: Secondary | ICD-10-CM

## 2023-01-03 DIAGNOSIS — E785 Hyperlipidemia, unspecified: Secondary | ICD-10-CM

## 2023-01-09 ENCOUNTER — Encounter (HOSPITAL_COMMUNITY): Payer: Self-pay

## 2023-01-09 ENCOUNTER — Other Ambulatory Visit (HOSPITAL_COMMUNITY): Payer: Self-pay | Admitting: Neuroradiology

## 2023-01-09 ENCOUNTER — Encounter: Payer: Self-pay | Admitting: Neurology

## 2023-01-09 ENCOUNTER — Ambulatory Visit: Payer: Managed Care, Other (non HMO) | Admitting: Neurology

## 2023-01-09 VITALS — BP 137/75 | HR 67 | Ht 70.0 in | Wt 214.0 lb

## 2023-01-09 DIAGNOSIS — I771 Stricture of artery: Secondary | ICD-10-CM

## 2023-01-09 DIAGNOSIS — E7801 Familial hypercholesterolemia: Secondary | ICD-10-CM | POA: Diagnosis not present

## 2023-01-09 DIAGNOSIS — E785 Hyperlipidemia, unspecified: Secondary | ICD-10-CM

## 2023-01-09 DIAGNOSIS — G459 Transient cerebral ischemic attack, unspecified: Secondary | ICD-10-CM

## 2023-01-09 DIAGNOSIS — I6521 Occlusion and stenosis of right carotid artery: Secondary | ICD-10-CM

## 2023-01-09 MED ORDER — ATORVASTATIN CALCIUM 40 MG PO TABS
80.0000 mg | ORAL_TABLET | Freq: Every day | ORAL | 2 refills | Status: DC
Start: 2023-01-09 — End: 2023-08-29

## 2023-01-09 NOTE — Progress Notes (Signed)
[facility] [address] [place].  [address] [phone]       OFFICE CONSULT NOTE  [name] Date of Birth:  08-31-1965 Medical Record Number:  [id number]   Referring MD: Nolon Bussing  Reason for Referral: Carotid occlusion  HPI: Jason [name] is a 57 year old pleasant Caucasian male seen today for initial office consult visit.  He is accompanied by his wife.  History is obtained from them and review of electronic medical records.  I personally reviewed pertinent imaging films in PACS.  He has past medical history of diabetes, hypertension, hyperlipidemia, peripheral vascular disease, anxiety depression and gastroesophageal reflux disease.  Patient states that he is at 3 episodes of transient left face and hand paresthesias over the last 3 months from March 2024.  Episode started with tingling numbness in the EXTR as well as left cheek as well as some difficulty speaking in the form of feeling of the tongue.  Denies any slurred speech or word finding difficulties.  Denies any weakness during these episodes which are are variable in duration and lasts from few minutes to maximum 30 minutes.  He denies any complaint headache blurred vision or other symptoms.  Patient underwent CT angiogram of the brain and neck on 11/27/2018.  Personally reviewed shows right internal carotid artery occlusion at origin with premature atherosclerosis without flow producing stenosis of major arteries in the head and neck.  Carotid ultrasound on 12/02/2022 confirms right ICA occlusion and left ICA 1-39% stenosed LDL cholesterol on 11/18/2022 was 91 mg percent and hemoglobin A1c on 10/18/2022 was 7.3.  Patient smokes 2 packs cigarettes per day and has done so for 30+ years.  He developed Significant peripheral vascular disease and had l11/23/22 LLE SFA/Pop stenting for lifestyle-limiting claudication. Pt also has a history of 11/2017 right femoral to above-knee popliteal bypass with vein,  left external iliac artery stent with Dr. Darrick Penna.   He was not Plavix and aspirin 81 mg has been added recently.  He also takes Lipitor 40 mg daily and he is tolerating it well.  ROS:   14 system review of systems is positive for tingling, numbness, leg pain all other systems negative  PMH:  Past Medical History:  Diagnosis Date   Anxiety    Depression    Diabetes mellitus without complication (HCC)    GERD (gastroesophageal reflux disease)    Headache(784.0)    Hyperlipidemia    Hypertension    MRSA infection    Left shin, back   Nerve damage    BLE   Neuromuscular disorder (HCC)    Osteoarthritis    Peripheral vascular disease (HCC)    Pneumonia     Social History:  Social History   Socioeconomic History   Marital status: Married    Spouse name: Not on file   Number of children: Not on file   Years of education: Not on file   Highest education level: Not on file  Occupational History   Not on file  Tobacco Use   Smoking status: Every Day    Packs/day: 2.00    Years: 25.00    Additional pack years: 0.00    Total pack years: 50.00    Types: Cigarettes   Smokeless tobacco: Never   Tobacco comments:    30 cigarettes per day  Vaping Use   Vaping Use: Never used  Substance and Sexual Activity   Alcohol use: Not Currently    Alcohol/week: 20.0 standard drinks of alcohol  Types: 20 Glasses of wine per week    Comment: not since Jan 2018   Drug use: No   Sexual activity: Not Currently  Other Topics Concern   Not on file  Social History Narrative   Not on file   Social Determinants of Health   Financial Resource Strain: Not on file  Food Insecurity: Not on file  Transportation Needs: Not on file  Physical Activity: Not on file  Stress: Not on file  Social Connections: Not on file  Intimate Partner Violence: Not on file    Medications:   Current Outpatient Medications on File Prior to Visit  Medication Sig Dispense Refill   aspirin EC 81 MG tablet  Take 1 tablet (81 mg total) by mouth daily. Swallow whole. 30 tablet 12   clopidogrel (PLAVIX) 75 MG tablet Take 1 tablet (75 mg total) by mouth daily. 90 tablet 3   CONTOUR NEXT TEST test strip TEST GLUCOSE ONCE DAILY. DX E 11.9 100 strip 3   diclofenac (VOLTAREN) 75 MG EC tablet TAKE 1 TABLET BY MOUTH TWICE A DAY 60 tablet 3   Dulaglutide (TRULICITY) 0.75 MG/0.5ML SOPN Inject 0.75 mg into the skin once a week. 2 mL 5   ezetimibe (ZETIA) 10 MG tablet TAKE 1 TABLET BY MOUTH EVERY DAY 90 tablet 0   Lancets MISC Contour Lancets.  Test glucose once daily. DxE11.9 100 each 3   metFORMIN (GLUCOPHAGE) 500 MG tablet TAKE 2 TABLETS BY MOUTH TWICE A DAY. INSURANCE ONLY PAYS FOR 90 DAYS NEEDS DOCTOR APPT 360 tablet 0   diazepam (VALIUM) 5 MG tablet Take one tablet 30 minutes before procedures (Patient not taking: Reported on 12/19/2022) 10 tablet 0   gabapentin (NEURONTIN) 300 MG capsule TAKE ONE CAPSULE BY MOUTH EVERY MORNING AND TAKE 2 CAPSULES BY MOUTH AT BEDTIME (Patient not taking: Reported on 12/19/2022) 270 capsule 0   LORazepam (ATIVAN) 0.5 MG tablet Take one to two tablets about one hour prior to flight. (Patient not taking: Reported on 12/19/2022) 20 tablet 0   No current facility-administered medications on file prior to visit.    Allergies:   Allergies  Allergen Reactions   Olmesartan Medoxomil Other (See Comments)    dizziness    Physical Exam General: well developed, well nourished middle-aged Caucasian male, seated, in no evident distress Head: head normocephalic and atraumatic.   Neck: supple with soft carotid  bruits Cardiovascular: regular rate and rhythm, no murmurs Musculoskeletal: no deformity Skin:  no rash/petichiae Vascular:  Normal pulses all extremities  Neurologic Exam Mental Status: Awake and fully alert. Oriented to place and time. Recent and remote memory intact. Attention span, concentration and fund of knowledge appropriate. Mood and affect appropriate.  Cranial  Nerves: Fundoscopic exam reveals sharp disc margins. Pupils equal, briskly reactive to light. Extraocular movements full without nystagmus. Visual fields full to confrontation. Hearing intact. Facial sensation intact. Face, tongue, palate moves normally and symmetrically.  Motor: Normal bulk and tone. Normal strength in all tested extremity muscles. Sensory.: intact to touch , pinprick , position and vibratory sensation.  Coordination: Rapid alternating movements normal in all extremities. Finger-to-nose and heel-to-shin performed accurately bilaterally. Gait and Station: Arises from chair without difficulty. Stance is normal. Gait demonstrates normal stride length and balance . Able to heel, toe and tandem walk without difficulty.  Reflexes: 1+ and symmetric. Toes downgoing.   NIHSS  0 Modified Rankin  1   ASSESSMENT: 57 year old Caucasian male with recurrent episode of left face and hand paresthesias  likely due to right hemispheric TIA symptomatic from right carotid occlusion.  Vascular risk factors of extracranial carotid disease, peripheral vascular disease, smoking, diabetes, hypertension and hyperlipidemia.     PLAN:I had a long d/w patient about his recent stroke, risk for recurrent stroke/TIAs, personally independently reviewed imaging studies and stroke evaluation results and answered questions.Continue aspirin 81 mg daily and Plavix ( clopidogrel)  75 mg daily  for secondary stroke prevention and maintain strict control of hypertension with blood pressure goal below 130/90, diabetes with hemoglobin A1c goal below 6.5% and lipids with LDL cholesterol goal below 70 mg/dL. I also advised the patient to eat a healthy diet with plenty of whole grains, cereals, fruits and vegetables, exercise regularly and maintain ideal body weight I advised the patient to quit smoking and he seems willing I advised him to seek help from his primary care physician for the same.  I encouraged him to drink plenty  of fluids and to avoid hypotension.  I advised him that during bypass surgery for chronic right carotid occlusion has not been shown to be beneficial  in stroke literature.  Followup in the future with me in 4 months or call earlier if necessary Greater than 50% time during this 45-minute consultation visit was spent in counseling and coordination of care about his carotid occlusion and stroke and answering questions. Delia Heady, MD Note: This document was prepared with digital dictation and possible smart phrase technology. Any transcriptional errors that result from this process are unintentional.

## 2023-01-09 NOTE — Patient Instructions (Addendum)
I had a long d/w patient about his recent stroke, risk for recurrent stroke/TIAs, personally independently reviewed imaging studies and stroke evaluation results and answered questions.Continue aspirin 81 mg daily and Plavix ( clopidogrel)  75 mg daily  for secondary stroke prevention and maintain strict control of hypertension with blood pressure goal below 130/90, diabetes with hemoglobin A1c goal below 6.5% and lipids with LDL cholesterol goal below 70 mg/dL. I also advised the patient to eat a healthy diet with plenty of whole grains, cereals, fruits and vegetables, exercise regularly and maintain ideal body weight I advised the patient to quit smoking and he seems willing I advised him to seek help from his primary care physician for the same.  Followup in the future with me in 4 months or call earlier if necessary  Stroke Prevention Some medical conditions and behaviors can lead to a higher chance of having a stroke. You can help prevent a stroke by eating healthy, exercising, not smoking, and managing any medical conditions you have. Stroke is a leading cause of functional impairment. Primary prevention is particularly important because a majority of strokes are first-time events. Stroke changes the lives of not only those who experience a stroke but also their family and other caregivers. How can this condition affect me? A stroke is a medical emergency and should be treated right away. A stroke can lead to brain damage and can sometimes be life-threatening. If a person gets medical treatment right away, there is a better chance of surviving and recovering from a stroke. What can increase my risk? The following medical conditions may increase your risk of a stroke: Cardiovascular disease. High blood pressure (hypertension). Diabetes. High cholesterol. Sickle cell disease. Blood clotting disorders (hypercoagulable state). Obesity. Sleep disorders (obstructive sleep apnea). Other risk factors  include: Being older than age 2. Having a history of blood clots, stroke, or mini-stroke (transient ischemic attack, TIA). Genetic factors, such as race, ethnicity, or a family history of stroke. Smoking cigarettes or using other tobacco products. Taking birth control pills, especially if you also use tobacco. Heavy use of alcohol or drugs, especially cocaine and methamphetamine. Physical inactivity. What actions can I take to prevent this? Manage your health conditions High cholesterol levels. Eating a healthy diet is important for preventing high cholesterol. If cholesterol cannot be managed through diet alone, you may need to take medicines. Take any prescribed medicines to control your cholesterol as told by your health care provider. Hypertension. To reduce your risk of stroke, try to keep your blood pressure below 130/80. Eating a healthy diet and exercising regularly are important for controlling blood pressure. If these steps are not enough to manage your blood pressure, you may need to take medicines. Take any prescribed medicines to control hypertension as told by your health care provider. Ask your health care provider if you should monitor your blood pressure at home. Have your blood pressure checked every year, even if your blood pressure is normal. Blood pressure increases with age and some medical conditions. Diabetes. Eating a healthy diet and exercising regularly are important parts of managing your blood sugar (glucose). If your blood sugar cannot be managed through diet and exercise, you may need to take medicines. Take any prescribed medicines to control your diabetes as told by your health care provider. Get evaluated for obstructive sleep apnea. Talk to your health care provider about getting a sleep evaluation if you snore a lot or have excessive sleepiness. Make sure that any other medical conditions you  have, such as atrial fibrillation or atherosclerosis, are  managed. Nutrition Follow instructions from your health care provider about what to eat or drink to help manage your health condition. These instructions may include: Reducing your daily calorie intake. Limiting how much salt (sodium) you use to 1,500 milligrams (mg) each day. Using only healthy fats for cooking, such as olive oil, canola oil, or sunflower oil. Eating healthy foods. You can do this by: Choosing foods that are high in fiber, such as whole grains, and fresh fruits and vegetables. Eating at least 5 servings of fruits and vegetables a day. Try to fill one-half of your plate with fruits and vegetables at each meal. Choosing lean protein foods, such as lean cuts of meat, poultry without skin, fish, tofu, beans, and nuts. Eating low-fat dairy products. Avoiding foods that are high in sodium. This can help lower blood pressure. Avoiding foods that have saturated fat, trans fat, and cholesterol. This can help prevent high cholesterol. Avoiding processed and prepared foods. Counting your daily carbohydrate intake.  Lifestyle If you drink alcohol: Limit how much you have to: 0-1 drink a day for women who are not pregnant. 0-2 drinks a day for men. Know how much alcohol is in your drink. In the [place] one drink equals one 12 oz bottle of beer (357m), one 5 oz glass of wine (1447m, or one 1 oz glass of hard liquor (4480m Do not use any products that contain nicotine or tobacco. These products include cigarettes, chewing tobacco, and vaping devices, such as e-cigarettes. If you need help quitting, ask your health care provider. Avoid secondhand smoke. Do not use drugs. Activity  Try to stay at a healthy weight. Get at least 30 minutes of exercise on most days, such as: Fast walking. Biking. Swimming. Medicines Take over-the-counter and prescription medicines only as told by your health care provider. Aspirin or blood thinners (antiplatelets or anticoagulants) may be recommended  to reduce your risk of forming blood clots that can lead to stroke. Avoid taking birth control pills. Talk to your health care provider about the risks of taking birth control pills if: You are over 35 1ars old. You smoke. You get very bad headaches. You have had a blood clot. Where to find more information American Stroke Association: www.strokeassociation.org Get help right away if: You or a loved one has any symptoms of a stroke. "BE FAST" is an easy way to remember the main warning signs of a stroke: B - Balance. Signs are dizziness, sudden trouble walking, or loss of balance. E - Eyes. Signs are trouble seeing or a sudden change in vision. F - Face. Signs are sudden weakness or numbness of the face, or the face or eyelid drooping on one side. A - Arms. Signs are weakness or numbness in an arm. This happens suddenly and usually on one side of the body. S - Speech. Signs are sudden trouble speaking, slurred speech, or trouble understanding what people say. T - Time. Time to call emergency services. Write down what time symptoms started. You or a loved one has other signs of a stroke, such as: A sudden, severe headache with no known cause. Nausea or vomiting. Seizure. These symptoms may represent a serious problem that is an emergency. Do not wait to see if the symptoms will go away. Get medical help right away. Call your local emergency services (911 in the [place] Do not drive yourself to the hospital. Summary You can help to prevent a stroke by eating  healthy, exercising, not smoking, limiting alcohol intake, and managing any medical conditions you may have. Do not use any products that contain nicotine or tobacco. These include cigarettes, chewing tobacco, and vaping devices, such as e-cigarettes. If you need help quitting, ask your health care provider. Remember "BE FAST" for warning signs of a stroke. Get help right away if you or a loved one has any of these signs. This information  is not intended to replace advice given to you by your health care provider. Make sure you discuss any questions you have with your health care provider. Document Revised: 02/27/2020 Document Reviewed: 03/16/2020 Elsevier Patient Education  2023 [employer].

## 2023-01-11 ENCOUNTER — Other Ambulatory Visit: Payer: Self-pay | Admitting: Radiology

## 2023-01-11 DIAGNOSIS — G459 Transient cerebral ischemic attack, unspecified: Secondary | ICD-10-CM

## 2023-01-11 NOTE — H&P (Incomplete)
Chief Complaint: Patient was seen in consultation today for TIAs, occluded right ICA  Referring Physician(s): Delia Heady, MD  Supervising Physician: Baldemar Lenis  Patient Status: [facility] - Out-pt  History of Present Illness: Jason [name] is a 57 y.o. male with a past medical history of significant for anxiety, depression, tobacco use (2 PPD currently), GERD, HTN, HLD, DM, PVD s/p right femoral to above-knee popliteal bypass/left external iliac stenting 11/2017 as well as LLE SFA/popliteal stenting 07/21/21 with vascular surgery who presents today for a diagnostic cerebral angiogram. Mr. Canjura began to experience intermittent episodes of transient left face and hand paraesthesia starting in March of this year when he was traveling back from [place].   He was seen by his PCP and underwent CTA head/neck w/ and w/o contrast 11/27/22 which showed:  1. Occluded right ICA with intracranial reconstitution. Chronic right frontal parietal infarct. 2. Premature atherosclerosis without flow reducing stenosis of major arteries in the head and neck.  He was referred back to vascular surgery 12/23/22 for follow up of LLE stenting and evaluation of the occluded right ICA seen on imaging. Dr. Karin Lieu discussed the imaging with Dr Tommi Rumps Melchor Amour for potential diagnostic cerebral angiogram in order to determine the collateral flow in the right hemisphere for which he presents today.    Patient is a full code ***.  Past Medical History:  Diagnosis Date   Anxiety    Depression    Diabetes mellitus without complication (HCC)    GERD (gastroesophageal reflux disease)    Headache(784.0)    Hyperlipidemia    Hypertension    MRSA infection    Left shin, back   Nerve damage    BLE   Neuromuscular disorder (HCC)    Osteoarthritis    Peripheral vascular disease (HCC)    Pneumonia     Past Surgical History:  Procedure Laterality Date   ABDOMINAL AORTOGRAM W/LOWER  EXTREMITY N/A 12/01/2017   Procedure: ABDOMINAL AORTOGRAM W/LOWER EXTREMITY;  Surgeon: Sherren Kerns, MD;  Location: [facility] INVASIVE CV LAB;  Service: Cardiovascular;  Laterality: N/A;   ABDOMINAL AORTOGRAM W/LOWER EXTREMITY Bilateral 07/21/2021   Procedure: ABDOMINAL AORTOGRAM W/LOWER EXTREMITY;  Surgeon: Victorino Sparrow, MD;  Location: [facility] INVASIVE CV LAB;  Service: Cardiovascular;  Laterality: Bilateral;   FEMORAL-POPLITEAL BYPASS GRAFT Right 12/11/2017   Procedure: BYPASS GRAFT RIGHT FEMORAL-ABOVE KNEE POPLITEAL ARTERY with Non Reversed Greater Saphenous Vein and Angioplasty distal vein Graft.;  Surgeon: Sherren Kerns, MD;  Location: [facility] OR;  Service: Vascular;  Laterality: Right;   PERIPHERAL VASCULAR BALLOON ANGIOPLASTY Right 12/01/2017   Procedure: PERIPHERAL VASCULAR BALLOON ANGIOPLASTY;  Surgeon: Sherren Kerns, MD;  Location: [facility] INVASIVE CV LAB;  Service: Cardiovascular;  Laterality: Right;  superficial femoral, attempted   PERIPHERAL VASCULAR INTERVENTION Left 12/01/2017   Procedure: PERIPHERAL VASCULAR INTERVENTION;  Surgeon: Sherren Kerns, MD;  Location: [facility] INVASIVE CV LAB;  Service: Cardiovascular;  Laterality: Left;  External iliac   PERIPHERAL VASCULAR INTERVENTION Left 07/21/2021   Procedure: PERIPHERAL VASCULAR INTERVENTION;  Surgeon: Victorino Sparrow, MD;  Location: [facility] INVASIVE CV LAB;  Service: Cardiovascular;  Laterality: Left;   ROTATOR CUFF REPAIR     TONSILLECTOMY     VEIN HARVEST Right 12/11/2017   Procedure: RIGHT GREATER SAPHENOUS VEIN HARVEST;  Surgeon: Sherren Kerns, MD;  Location: [facility] OR;  Service: Vascular;  Laterality: Right;    Allergies: Olmesartan medoxomil  Medications: Prior to Admission medications   Medication Sig Start Date End  Date Taking? Authorizing Provider  aspirin EC 81 MG tablet Take 1 tablet (81 mg total) by mouth daily. Swallow whole. 12/23/22   Victorino Sparrow, MD  atorvastatin (LIPITOR) 40 MG tablet Take 2 tablets (80 mg total) by mouth  daily. *labs required for refills 01/09/23   Micki Riley, MD  clopidogrel (PLAVIX) 75 MG tablet Take 1 tablet (75 mg total) by mouth daily. 12/23/22   Victorino Sparrow, MD  CONTOUR NEXT TEST test strip TEST GLUCOSE ONCE DAILY. DX E 11.9 03/03/20   Burchette, Elberta Fortis, MD  diazepam (VALIUM) 5 MG tablet Take one tablet 30 minutes before procedures Patient not taking: Reported on 12/19/2022 11/23/22   Nelwyn Salisbury, MD  diclofenac (VOLTAREN) 75 MG EC tablet TAKE 1 TABLET BY MOUTH TWICE A DAY 08/17/22   Burchette, Elberta Fortis, MD  Dulaglutide (TRULICITY) 0.75 MG/0.5ML SOPN Inject 0.75 mg into the skin once a week. 11/18/22   Burchette, Elberta Fortis, MD  ezetimibe (ZETIA) 10 MG tablet TAKE 1 TABLET BY MOUTH EVERY DAY 03/22/22   Burchette, Elberta Fortis, MD  gabapentin (NEURONTIN) 300 MG capsule TAKE ONE CAPSULE BY MOUTH EVERY MORNING AND TAKE 2 CAPSULES BY MOUTH AT BEDTIME Patient not taking: Reported on 12/19/2022 10/29/21   [name], MD  Lancets MISC Contour Lancets.  Test glucose once daily. DxE11.9 07/14/17   Burchette, Elberta Fortis, MD  LORazepam (ATIVAN) 0.5 MG tablet Take one to two tablets about one hour prior to flight. Patient not taking: Reported on 12/19/2022 10/18/22   [name], MD  metFORMIN (GLUCOPHAGE) 500 MG tablet TAKE 2 TABLETS BY MOUTH TWICE A DAY. INSURANCE ONLY PAYS FOR 90 DAYS NEEDS DOCTOR APPT 11/04/22   Burchette, Elberta Fortis, MD     Family History  Problem Relation Age of Onset   Heart attack Father 52   Hypertension Father    Arthritis Mother    Asthma Maternal Grandfather    Stroke Paternal Grandfather    Heart disease Other    Mental illness Other    Cancer Neg Hx    Diabetes Neg Hx    Drug abuse Neg Hx    Early death Neg Hx    Hearing loss Neg Hx    Hyperlipidemia Neg Hx     Social History   Socioeconomic History   Marital status: Married    Spouse name: Not on file   Number of children: Not on file   Years of education: Not on file   Highest education level: Not on  file  Occupational History   Not on file  Tobacco Use   Smoking status: Every Day    Packs/day: 2.00    Years: 25.00    Additional pack years: 0.00    Total pack years: 50.00    Types: Cigarettes   Smokeless tobacco: Never   Tobacco comments:    30 cigarettes per day  Vaping Use   Vaping Use: Never used  Substance and Sexual Activity   Alcohol use: Not Currently    Alcohol/week: 20.0 standard drinks of alcohol    Types: 20 Glasses of wine per week    Comment: not since Jan 2018   Drug use: No   Sexual activity: Not Currently  Other Topics Concern   Not on file  Social History Narrative   Not on file   Social Determinants of Health   Financial Resource Strain: Not on file  Food Insecurity: Not on file  Transportation Needs: Not on  file  Physical Activity: Not on file  Stress: Not on file  Social Connections: Not on file     Review of Systems: A 12 point ROS discussed and pertinent positives are indicated in the HPI above.  All other systems are negative.  Review of Systems  Vital Signs: There were no vitals taken for this visit.  Physical Exam       Imaging: VAS US AORTA/IVC/ILIACS  Result Date: 12/23/2022 ABDOMINAL AORTA STUDY Patient Name:  KYLA TIMM  Date of Exam:   12/23/2022 Medical Rec #: [id number]         Accession #:    [id number] Date of Birth: [birth year]        Patient Gender: M Patient Age:   52 years Exam Location:  [facility] Procedure:      VAS US AORTA/IVC/ILIACS Referring Phys: Ivin Booty ROBINS --------------------------------------------------------------------------------  Indications: Peripheral arterial disease. Patient denies any claudication              symptoms or rest pain.               Today's ABIs are .99 on the right and 1.06 on the left. Risk Factors: Hypertension, hyperlipidemia, Diabetes, current smoker. Vascular Interventions: Left external iliac artery stent placed on 12/01/2017.                         Right femoral to  above-the-knee popliteal artery bypass                         graft on 12/15/2017. Left femoral to popliteal artery                         angioplasty and stent on 07/21/2021. Limitations: Air/bowel gas and obesity.  Comparison Study: NA Performing Technologist: Tyna Jaksch RVT  Examination Guidelines: A complete evaluation includes B-mode imaging, spectral Doppler, color Doppler, and power Doppler as needed of all accessible portions of each vessel. Bilateral testing is considered an integral part of a complete examination. Limited examinations for reoccurring indications may be performed as noted.  Abdominal Aorta Findings: +-------------+-------+----------+----------+-----------+--------+--------+ Location     AP (cm)Trans (cm)PSV (cm/s)Waveform   ThrombusComments +-------------+-------+----------+----------+-----------+--------+--------+ Proximal     2.20   2.20      105                                   +-------------+-------+----------+----------+-----------+--------+--------+ Mid                           109                                   +-------------+-------+----------+----------+-----------+--------+--------+ Distal                        64                                    +-------------+-------+----------+----------+-----------+--------+--------+ LT CIA Prox                   162       biphasic                    +-------------+-------+----------+----------+-----------+--------+--------+  LT CIA Mid                    141       multiphasic                 +-------------+-------+----------+----------+-----------+--------+--------+ LT CIA Distal                 114       multiphasic                 +-------------+-------+----------+----------+-----------+--------+--------+ IVC/Iliac Findings: +--------+------+--------+--------+   IVC   PatentThrombusComments +--------+------+--------+--------+ IVC Proxpatent                  +--------+------+--------+--------+   Left Stent(s): +---------------------+--------+---------------+-----------+-------------+ External Iliac ArteryPSV cm/sStenosis       Waveform   Comments      +---------------------+--------+---------------+-----------+-------------+ Prox to Stent        145                    biphasic                 +---------------------+--------+---------------+-----------+-------------+ Proximal Stent       139                    biphasic                 +---------------------+--------+---------------+-----------+-------------+ Mid Stent            214     50-99% stenosisbiphasic   low end range +---------------------+--------+---------------+-----------+-------------+ Distal Stent         179                    multiphasic              +---------------------+--------+---------------+-----------+-------------+ Distal to Stent      208     50-99% stenosisbiphasic   low end range +---------------------+--------+---------------+-----------+-------------+ Patent left external iliac artery stent with mildly elevated velocities noted in the mid stent and post stent segments.  Summary: Abdominal Aorta: The largest aortic measurement is 2.2 cm. Stenosis: +-------------------+-----------------+---------------+------------------------+ Location           Stenosis         Stent          Comments                 +-------------------+-----------------+---------------+------------------------+ Left Common Iliac  no focal stenosis                                        +-------------------+-----------------+---------------+------------------------+ Left External Iliac                 50-99% stenosislow end range, mid and                                                      post stent segments      +-------------------+-----------------+---------------+------------------------+  IVC/Iliac: Patent IVC.  *See table(s) above for measurements and  observations. See LE Arterial duplex and ABI reports. Suggest follow up study in 12 months.  Electronically signed by Julien Nordmann MD on 12/23/2022 at 9:51:28 PM.    Final    VAS [place] ABI WITH/WO TBI  Result Date:  12/23/2022  LOWER EXTREMITY DOPPLER STUDY Patient Name:  DARRIUS INOUYE  Date of Exam:   12/23/2022 Medical Rec #: [id number]         Accession #:    [id number] Date of Birth: October 02, 1965        Patient Gender: M Patient Age:   71 years Exam Location:  [facility] Procedure:      VAS [place] ABI WITH/WO TBI Referring Phys: JOSHUA ROBINS --------------------------------------------------------------------------------  Indications: Peripheral artery disease. Patient denies any claudication symptoms              or rest pain. High Risk Factors: Hypertension, hyperlipidemia, Diabetes, current smoker.  Vascular Interventions: Left external iliac artery stent placed on 12/01/2017.                         Right femoral to above-the-knee popliteal artery bypass                         graft on 12/15/2017. Left femoral to popliteal artery                         angioplasty and stent on 07/21/2021. Comparison Study: In 07/2021, a lower arterial Doppler performed at                   [facility] at [facility] showed an ABI of .95 on                   the right and 1.07 on the left. Performing Technologist: Tyna Jaksch RVT  Examination Guidelines: A complete evaluation includes at minimum, Doppler waveform signals and systolic blood pressure reading at the level of bilateral brachial, anterior tibial, and posterior tibial arteries, when vessel segments are accessible. Bilateral testing is considered an integral part of a complete examination. Photoelectric Plethysmograph (PPG) waveforms and toe systolic pressure readings are included as required and additional duplex testing as needed. Limited examinations for reoccurring indications may be performed as noted.  ABI Findings:  +---------+------------------+-----+---------+--------+ Right    Rt Pressure (mmHg)IndexWaveform Comment  +---------+------------------+-----+---------+--------+ Brachial 153                                      +---------+------------------+-----+---------+--------+ PTA      152               0.99 triphasic         +---------+------------------+-----+---------+--------+ DP       144               0.94 triphasic         +---------+------------------+-----+---------+--------+ Great Toe172               1.12 Normal            +---------+------------------+-----+---------+--------+ +---------+------------------+-----+---------+-------+ Left     Lt Pressure (mmHg)IndexWaveform Comment +---------+------------------+-----+---------+-------+ Brachial 154                                     +---------+------------------+-----+---------+-------+ PTA      162               1.05 triphasic        +---------+------------------+-----+---------+-------+ DP       163  1.06 triphasic        +---------+------------------+-----+---------+-------+ Great Toe162               1.05 Normal           +---------+------------------+-----+---------+-------+ +-------+-----------+-----------+------------+------------+ ABI/TBIToday's ABIToday's TBIPrevious ABIPrevious TBI +-------+-----------+-----------+------------+------------+ Right  .99        1.12       .95         .63          +-------+-----------+-----------+------------+------------+ Left   1.06       1.05       1.07        .61          +-------+-----------+-----------+------------+------------+  Bilateral ABIs appear essentially unchanged compared to prior study on 08/27/2021. Bilateral TBIs appear increased compared to prior study on 08/27/2021.  Summary: Right: Resting right ankle-brachial index is within normal range. The right toe-brachial index is normal. Left: Resting left ankle-brachial  index is within normal range. The left toe-brachial index is normal. *See table(s) above for measurements and observations.  See Aortoiliac and LE Arterial duplex reports. Suggest follow up study in 12 months. Electronically signed by Julien Nordmann MD on 12/23/2022 at 9:49:19 PM.    Final     Labs:  CBC: No results for input(s): "WBC", "HGB", "HCT", "PLT" in the last 8760 hours.  COAGS: No results for input(s): "INR", "APTT" in the last 8760 hours.  BMP: Recent Labs    11/18/22 1045  NA 138  K 4.3  CL 103  CO2 27  GLUCOSE 128*  BUN 18  CALCIUM 9.4  CREATININE 0.86    LIVER FUNCTION TESTS: Recent Labs    11/18/22 1045  BILITOT 0.4  AST 13  ALT 21  ALKPHOS 89  PROT 7.1  ALBUMIN 4.5    TUMOR MARKERS: No results for input(s): "AFPTM", "CEA", "CA199", "CHROMGRNA" in the last 8760 hours.  Assessment and Plan:  57 y/o M with recent history of multiple TIAs found to have occluded right ICA with intracranial reconstitution. He is followed by vascular surgery for possible intervention and NIR has been consulted for diagnostic cerebral angiogram to assess collateral flow to the right hemisphere.  Patient confirms code status as: FULL CODE.  Risks and benefits of diagnostic cerebral angiogram were discussed with the patient including, but not limited to bleeding, infection, vascular injury, stroke, or contrast induced renal failure.  This interventional procedure involves the use of X-rays and because of the nature of the planned procedure, it is possible that we will have prolonged use of X-ray fluoroscopy.  Potential radiation risks to you include (but are not limited to) the following: - A slightly elevated risk for cancer  several years later in life. This risk is typically less than 0.5% percent. This risk is low in comparison to the normal incidence of human cancer, which is 33% for women and 50% for men according to the [employer] Society. - Radiation induced injury  can include skin redness, resembling a rash, tissue breakdown / ulcers and hair loss (which can be temporary or permanent).  The likelihood of either of these occurring depends on the difficulty of the procedure and whether you are sensitive to radiation due to previous procedures, disease, or genetic conditions.  IF your procedure requires a prolonged use of radiation, you will be notified and given written instructions for further action.  It is your responsibility to monitor the irradiated area for the 2 weeks following the procedure and to notify  your physician if you are concerned that you have suffered a radiation induced injury.    All of the patient's questions were answered, patient is agreeable to proceed.  Consent signed and in chart.  Thank you for this interesting consult.  I greatly enjoyed meeting DAMARCO OVERCASH and look forward to participating in their care.  A copy of this report was sent to the requesting provider on this date.  Electronically Signed: Villa Herb, PA-C 01/11/2023, 11:42 AM   I spent a total of 40 Minutes  in face to face in clinical consultation, greater than 50% of which was counseling/coordinating care for right ICA occlusion.

## 2023-01-12 ENCOUNTER — Ambulatory Visit (HOSPITAL_COMMUNITY)
Admission: RE | Admit: 2023-01-12 | Discharge: 2023-01-12 | Disposition: A | Discharge status: Home / Self Care | Payer: Managed Care, Other (non HMO) | Source: Ambulatory Visit | Attending: Neuroradiology | Admitting: Neuroradiology

## 2023-01-12 ENCOUNTER — Other Ambulatory Visit (HOSPITAL_COMMUNITY): Payer: Self-pay | Admitting: Neuroradiology

## 2023-01-12 ENCOUNTER — Other Ambulatory Visit: Payer: Self-pay

## 2023-01-12 DIAGNOSIS — I6521 Occlusion and stenosis of right carotid artery: Secondary | ICD-10-CM

## 2023-01-12 DIAGNOSIS — F324 Major depressive disorder, single episode, in partial remission: Secondary | ICD-10-CM | POA: Diagnosis not present

## 2023-01-12 DIAGNOSIS — E785 Hyperlipidemia, unspecified: Secondary | ICD-10-CM | POA: Insufficient documentation

## 2023-01-12 DIAGNOSIS — E1151 Type 2 diabetes mellitus with diabetic peripheral angiopathy without gangrene: Secondary | ICD-10-CM | POA: Diagnosis not present

## 2023-01-12 DIAGNOSIS — F419 Anxiety disorder, unspecified: Secondary | ICD-10-CM | POA: Diagnosis not present

## 2023-01-12 DIAGNOSIS — F17210 Nicotine dependence, cigarettes, uncomplicated: Secondary | ICD-10-CM | POA: Insufficient documentation

## 2023-01-12 DIAGNOSIS — K219 Gastro-esophageal reflux disease without esophagitis: Secondary | ICD-10-CM | POA: Insufficient documentation

## 2023-01-12 DIAGNOSIS — G459 Transient cerebral ischemic attack, unspecified: Secondary | ICD-10-CM

## 2023-01-12 DIAGNOSIS — I10 Essential (primary) hypertension: Secondary | ICD-10-CM | POA: Diagnosis not present

## 2023-01-12 HISTORY — PX: IR US GUIDE VASC ACCESS RIGHT: IMG2390

## 2023-01-12 HISTORY — PX: IR ANGIO VERTEBRAL SEL VERTEBRAL BILAT MOD SED: IMG5369

## 2023-01-12 HISTORY — PX: IR ANGIO INTRA EXTRACRAN SEL COM CAROTID INNOMINATE UNI R MOD SED: IMG5359

## 2023-01-12 HISTORY — PX: IR ANGIO EXTERNAL CAROTID SEL EXT CAROTID UNI L MOD SED: IMG5370

## 2023-01-12 HISTORY — PX: IR ANGIO INTRA EXTRACRAN SEL INTERNAL CAROTID UNI L MOD SED: IMG5361

## 2023-01-12 LAB — BASIC METABOLIC PANEL
Anion gap: 9 (ref 5–15)
BUN: 8 mg/dL (ref 6–20)
CO2: 22 mmol/L (ref 22–32)
Calcium: 8.8 mg/dL — ABNORMAL LOW (ref 8.9–10.3)
Chloride: 104 mmol/L (ref 98–111)
Creatinine, Ser: 0.73 mg/dL (ref 0.61–1.24)
GFR, Estimated: >60 mL/min (ref >60)
Glucose, Bld: 147 mg/dL — ABNORMAL HIGH (ref 70–99)
Potassium: 4.1 mmol/L (ref 3.5–5.1)
Sodium: 135 mmol/L (ref 135–145)

## 2023-01-12 LAB — GLUCOSE, CAPILLARY
Glucose-Capillary: 164 mg/dL — ABNORMAL HIGH (ref 70–99)
Glucose-Capillary: 135 mg/dL — ABNORMAL HIGH (ref 70–99)

## 2023-01-12 LAB — CBC
HCT: 45.6 % (ref 39.0–52.0)
Hemoglobin: 15.6 g/dL (ref 13.0–17.0)
MCH: 29.3 pg (ref 26.0–34.0)
MCHC: 34.2 g/dL (ref 30.0–36.0)
MCV: 85.7 fL (ref 80.0–100.0)
Platelets: 281 10*3/uL (ref 150–400)
RBC: 5.32 MIL/uL (ref 4.22–5.81)
RDW: 12.7 % (ref 11.5–15.5)
WBC: 12.8 10*3/uL — ABNORMAL HIGH (ref 4.0–10.5)
nRBC: 0 % (ref 0.0–0.2)

## 2023-01-12 LAB — PROTIME-INR
INR: 0.9 (ref 0.8–1.2)
Prothrombin Time: 12.4 seconds (ref 11.4–15.2)

## 2023-01-12 MED ORDER — VERAPAMIL HCL 2.5 MG/ML IV SOLN
INTRAVENOUS | Status: AC
  Filled 2023-01-12: qty 2

## 2023-01-12 MED ORDER — FENTANYL CITRATE (PF) 100 MCG/2ML IJ SOLN
INTRAMUSCULAR | Status: AC | PRN
  Administered 2023-01-12: 25 ug via INTRAVENOUS

## 2023-01-12 MED ORDER — VERAPAMIL HCL 2.5 MG/ML IV SOLN: INTRA_ARTERIAL | Status: AC | PRN

## 2023-01-12 MED ORDER — HEPARIN SODIUM (PORCINE) 1000 UNIT/ML IJ SOLN
INTRAMUSCULAR | Status: AC
  Filled 2023-01-12: qty 10

## 2023-01-12 MED ORDER — SODIUM CHLORIDE 0.9 % IV SOLN: Freq: Once | INTRAVENOUS | Status: AC

## 2023-01-12 MED ORDER — IOHEXOL 300 MG/ML  SOLN: 150.0000 mL | Freq: Once | INTRAMUSCULAR | Status: DC | PRN

## 2023-01-12 MED ORDER — ACETAMINOPHEN 325 MG PO TABS: 650.0000 mg | ORAL_TABLET | Freq: Four times a day (QID) | ORAL | Status: DC | PRN

## 2023-01-12 MED ORDER — IOHEXOL 300 MG/ML  SOLN
50.0000 mL | Freq: Once | INTRAMUSCULAR | Status: AC | PRN
  Administered 2023-01-12: 10 mL via INTRA_ARTERIAL

## 2023-01-12 MED ORDER — IOHEXOL 300 MG/ML  SOLN
100.0000 mL | Freq: Once | INTRAMUSCULAR | Status: AC | PRN
  Administered 2023-01-12: 100 mL via INTRA_ARTERIAL

## 2023-01-12 MED ORDER — FENTANYL CITRATE (PF) 100 MCG/2ML IJ SOLN
INTRAMUSCULAR | Status: AC
  Filled 2023-01-12: qty 2

## 2023-01-12 MED ORDER — NITROGLYCERIN 1 MG/10 ML FOR IR/CATH LAB
INTRA_ARTERIAL | Status: AC
  Filled 2023-01-12: qty 10

## 2023-01-12 MED ORDER — MIDAZOLAM HCL 2 MG/2ML IJ SOLN
INTRAMUSCULAR | Status: AC
  Filled 2023-01-12: qty 2

## 2023-01-12 MED ORDER — LIDOCAINE HCL 1 % IJ SOLN
INTRAMUSCULAR | Status: AC
  Filled 2023-01-12: qty 20

## 2023-01-12 MED ORDER — MIDAZOLAM HCL 2 MG/2ML IJ SOLN
INTRAMUSCULAR | Status: AC | PRN
  Administered 2023-01-12: 1 mg via INTRAVENOUS

## 2023-01-13 ENCOUNTER — Ambulatory Visit (INDEPENDENT_AMBULATORY_CARE_PROVIDER_SITE_OTHER): Payer: Self-pay | Admitting: Vascular Surgery

## 2023-01-13 ENCOUNTER — Ambulatory Visit (HOSPITAL_COMMUNITY): Admission: RE | Admit: 2023-01-13 | Payer: Managed Care, Other (non HMO) | Source: Ambulatory Visit

## 2023-01-13 DIAGNOSIS — I739 Peripheral vascular disease, unspecified: Secondary | ICD-10-CM

## 2023-01-13 NOTE — Progress Notes (Signed)
Office Note     HPI: Jason [name] is a 57 y.o. (1965-11-14) male presenting in follow up   Prior history includes 07/21/21 LLE SFA/Pop stenting for lifestyle-limiting claudication. Pt also has a history of 11/2017 right femoral to above-knee popliteal bypass with vein, left external iliac artery stent with Dr. Darrick Penna.  He immigrated to the [place] from [place] 1999.  Exam today, he was accompanied by his wife.  Several months ago, Jason [name] noted left-sided paresthesias, weakness, facial droop.  He was on his way back from [place], therefore got on the plane and symptoms resolved.  He has had multiple other instances of the same symptoms with recent head CT demonstrating occlusion of his right-sided internal carotid artery.  His most recent event occurred roughly 2 weeks ago, and caused him to drop his cigarette as his left arm and face went numb.  The pt is  on a statin for cholesterol management.  The pt is not on a daily aspirin.   Other AC: Stopped taking Plavix The pt is  on medication for hypertension.   The pt is diabetic.  Tobacco hx:  Daily  Past Medical History:  Diagnosis Date   Anxiety    Depression    Diabetes mellitus without complication (HCC)    GERD (gastroesophageal reflux disease)    Headache(784.0)    Hyperlipidemia    Hypertension    MRSA infection    Left shin, back   Nerve damage    BLE   Neuromuscular disorder (HCC)    Osteoarthritis    Peripheral vascular disease (HCC)    Pneumonia     Past Surgical History:  Procedure Laterality Date   ABDOMINAL AORTOGRAM W/LOWER EXTREMITY N/A 12/01/2017   Procedure: ABDOMINAL AORTOGRAM W/LOWER EXTREMITY;  Surgeon: Sherren Kerns, MD;  Location: [facility] INVASIVE CV LAB;  Service: Cardiovascular;  Laterality: N/A;   ABDOMINAL AORTOGRAM W/LOWER EXTREMITY Bilateral 07/21/2021   Procedure: ABDOMINAL AORTOGRAM W/LOWER EXTREMITY;  Surgeon: Victorino Sparrow, MD;  Location: [facility] INVASIVE CV LAB;  Service:  Cardiovascular;  Laterality: Bilateral;   FEMORAL-POPLITEAL BYPASS GRAFT Right 12/11/2017   Procedure: BYPASS GRAFT RIGHT FEMORAL-ABOVE KNEE POPLITEAL ARTERY with Non Reversed Greater Saphenous Vein and Angioplasty distal vein Graft.;  Surgeon: Sherren Kerns, MD;  Location: [facility] OR;  Service: Vascular;  Laterality: Right;   PERIPHERAL VASCULAR BALLOON ANGIOPLASTY Right 12/01/2017   Procedure: PERIPHERAL VASCULAR BALLOON ANGIOPLASTY;  Surgeon: Sherren Kerns, MD;  Location: [facility] INVAS[facility] CV LAB;  Service: Cardiovascular;  Laterality: Right;  superficial femoral, attempted   PERIPHERAL VASCULAR INTERVENTION Left 12/01/2017   Procedure: PERIPHERAL VASCULAR INTERVENTION;  Surgeon: Sherren Kerns, MD;  Location: [facility] INVAS[facility] CV LAB;  Service: Cardiovascular;  Laterality: Left;  External iliac   PERIPHERAL VASCULAR INTERVENTION Left 07/21/2021   Procedure: PERIPHERAL VASCULAR INTERVENTION;  Surgeon: Victorino Sparrow, MD;  Location: [facility] INVASIVE CV LAB;  Service: Cardiovascular;  Laterality: Left;   ROTATOR CUFF REPAIR     TONSILLECTOMY     VEIN HARVEST Right 12/11/2017   Procedure: RIGHT GREATER SAPHENOUS VEIN HARVEST;  Surgeon: Sherren Kerns, MD;  Location: [facility] OR;  Service: Vascular;  Laterality: Right;    Social History   Socioeconomic History   Marital status: Married    Spouse name: Not on file   Number of children: Not on file   Years of education: Not on file   Highest education level: Not on file  Occupational History   Not on file  Tobacco  Use   Smoking status: Every Day    Packs/day: 2.00    Years: 25.00    Additional pack years: 0.00    Total pack years: 50.00    Types: Cigarettes   Smokeless tobacco: Never   Tobacco comments:    30 cigarettes per day  Vaping Use   Vaping Use: Never used  Substance and Sexual Activity   Alcohol use: Not Currently    Alcohol/week: 20.0 standard drinks of alcohol    Types: 20 Glasses of wine per week    Comment: not since Jan 2018   Drug  use: No   Sexual activity: Not Currently  Other Topics Concern   Not on file  Social History Narrative   Not on file   Social Determinants of Health   Financial Resource Strain: Not on file  Food Insecurity: Not on file  Transportation Needs: Not on file  Physical Activity: Not on file  Stress: Not on file  Social Connections: Not on file  Intimate Partner Violence: Not on file    Family History  Problem Relation Age of Onset   Heart attack Father 21   Hypertension Father    Arthritis Mother    Asthma Maternal Grandfather    Stroke Paternal Grandfather    Heart disease Other    Mental illness Other    Cancer Neg Hx    Diabetes Neg Hx    Drug abuse Neg Hx    Early death Neg Hx    Hearing loss Neg Hx    Hyperlipidemia Neg Hx     Current Outpatient Medications  Medication Sig Dispense Refill   aspirin EC 81 MG tablet Take 1 tablet (81 mg total) by mouth daily. Swallow whole. 30 tablet 12   atorvastatin (LIPITOR) 40 MG tablet Take 2 tablets (80 mg total) by mouth daily. *labs required for refills 90 tablet 2   clopidogrel (PLAVIX) 75 MG tablet Take 1 tablet (75 mg total) by mouth daily. 90 tablet 3   CONTOUR NEXT TEST test strip TEST GLUCOSE ONCE DAILY. DX E 11.9 100 strip 3   diazepam (VALIUM) 5 MG tablet Take one tablet 30 minutes before procedures (Patient not taking: Reported on 12/19/2022) 10 tablet 0   diclofenac (VOLTAREN) 75 MG EC tablet TAKE 1 TABLET BY MOUTH TWICE A DAY 60 tablet 3   Dulaglutide (TRULICITY) 0.75 MG/0.5ML SOPN Inject 0.75 mg into the skin once a week. 2 mL 5   ezetimibe (ZETIA) 10 MG tablet TAKE 1 TABLET BY MOUTH EVERY DAY 90 tablet 0   gabapentin (NEURONTIN) 300 MG capsule TAKE ONE CAPSULE BY MOUTH EVERY MORNING AND TAKE 2 CAPSULES BY MOUTH AT BEDTIME (Patient not taking: Reported on 12/19/2022) 270 capsule 0   Lancets MISC Contour Lancets.  Test glucose once daily. DxE11.9 100 each 3   LORazepam (ATIVAN) 0.5 MG tablet Take one to two tablets about  one hour prior to flight. (Patient not taking: Reported on 12/19/2022) 20 tablet 0   metFORMIN (GLUCOPHAGE) 500 MG tablet TAKE 2 TABLETS BY MOUTH TWICE A DAY. INSURANCE ONLY PAYS FOR 90 DAYS NEEDS DOCTOR APPT 360 tablet 0   No current facility-administered medications for this visit.    Allergies  Allergen Reactions   Olmesartan Medoxomil Other (See Comments)    dizziness     REVIEW OF SYSTEMS:   [X]  denotes positive finding, [ ]  denotes negative finding Cardiac  Comments:  Chest pain or chest pressure:    Shortness of breath upon  exertion:    Short of breath when lying flat:    Irregular heart rhythm:        Vascular    Pain in calf, thigh, or hip brought on by ambulation:    Pain in feet at night that wakes you up from your sleep:     Blood clot in your veins:    Leg swelling:         Pulmonary    Oxygen at home:    Productive cough:     Wheezing:         Neurologic    Sudden weakness in arms or legs:     Sudden numbness in arms or legs:     Sudden onset of difficulty speaking or slurred speech:    Temporary loss of vision in one eye:     Problems with dizziness:         Gastrointestinal    Blood in stool:     Vomited blood:         Genitourinary    Burning when urinating:     Blood in urine:        Psychiatric    Major depression:         Hematologic    Bleeding problems:    Problems with blood clotting too easily:        Skin    Rashes or ulcers:        Constitutional    Fever or chills:      PHYSICAL EXAMINATION:  There were no vitals filed for this visit.   General:  WDWN in NAD; vital signs documented above Gait: Not observed HENT: WNL, normocephalic Pulmonary: normal non-labored breathing , without Rales, rhonchi,  wheezing Cardiac: regular HR Abdomen: soft, NT, no masses Skin: without rashes Vascular Exam/Pulses:  Right Left  Radial 2+ (normal) 2+ (normal)  Ulnar 2+ (normal) 2+ (normal)  Femoral 2+ (normal) 2+ (normal)      DP 2+  2+  PT 2+ (normal) 2+   Extremities: without ischemic changes, without Gangrene , without cellulitis; without open wounds;  Musculoskeletal: no muscle wasting or atrophy  Neurologic: A&O X 3;  No focal weakness or paresthesias are detected Psychiatric:  The pt has Normal affect.   Non-Invasive Vascular Imaging:     +-------+-----------+-----------+------------+------------+  ABI/TBIToday's ABIToday's TBIPrevious ABIPrevious TBI  +-------+-----------+-----------+------------+------------+  Right  0.95       0.63       0.99        0.79          +-------+-----------+-----------+------------+------------+  Left   1.07       0.61       0.48        0.25          +-------+-----------+-----------+------------+------------+      ASSESSMENT/PLAN: MACCOY CANIDA is a 57 y.o. male presenting in follow up s/p 07/21/21 LLE SFA/Pop stenting for lifestyle-limiting claudication. Pt also has a history of 11/2017 right femoral to above-knee popliteal bypass with vein, left external iliac artery stent with Dr. Darrick Penna.  He immigrated to the [place] from [place] 1999.  Rowen several issues that needed to be addressed.  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  I have reached out to my colleague Dr. Quay Burow - neuro IR for cerebral angiogram in an effort to see if he is feeling cerebral vessels from his external carotid artery.  Should this  be the case, atrium will require surgery for ICA ligation, ECA patch plasty.  I have also sent referral to neurology.  ABI unchanged, left external iliac stent patent without significant stenosis.  Bilateral lower extremity duplex ultrasounds were not completed, and need to be to evaluate his right-sided bypass, left-sided SFA stents.  I am comforted knowing he is palpable in his feet distally, but want to ensure he does not have any in-stent restenosis which could  threaten his bypass or stents.  Regarding his arterial disease, he continues to smoke 2 packs/day, he has not been compliant with aspirin or Plavix. We discussed that this is paramount in the setting of recurrent TIAs.  Plan to reimage bilateral lower extremities once the etiology of left-sided TIAs has been defined and treated.   The episodes sound like small vessel disease TIAs rather than carotid distribution and she needs aggressive risk factor control including smoking cessation first   Recommend the following which can slow the progression of atherosclerosis and reduce the risk of major adverse cardiac / limb events:  Aspirin 81mg  PO QD.  Atorvastatin 40-80mg  PO QD (or other "high intensity" statin therapy). Complete cessation from all tobacco products. Blood glucose control with goal A1c < 7%. Blood pressure control with goal blood pressure < 140/90 mmHg. Lipid reduction therapy with goal LDL-C <100 mg/dL (<82 if symptomatic from PAD).    Victorino Sparrow, MD [facility] [phone]

## 2023-01-13 NOTE — Procedures (Signed)
INTERVENTIONAL NEURORADIOLOGY BRIEF POSTPROCEDURE NOTE  DIAGNOSTIC CEREBRAL ANGIOGRAM  Attending: Baldemar Lenis, MD  Diagnosis: Right ICA occlusion  Access site: Distal right radial artery  Access closure: Inflatable band  Anesthesia: Moderate sedation  Medication used: 1 mg Versed IV; 25 mcg Fentanyl IV.  Complications: None  Estimated blood loss: None  Specimen: None  Findings:  Occlusion of the cervical right ICA at its origin.   Multiple developed collateral supply to the intracranial right ICA, suggestive of chronic process.   Main arterial supply to the right ICA is from the posterior circulation via prominent right posterior communicating artery and from right internal maxillary artery via inferolateral trunk.  Other supplies include posterior pericallosal, PCA-MCA leptomeningeal collaterals, anterior communicating artery, right MHT-left MHT, right internal maxillary artery, right facial artery and right superficial temporal artery to right ophthalmic artery and cavernous branches of the right accessory meningeal artery to inferolateral trunk.   The patient tolerated the procedure well without incident or complication and is in stable condition.

## 2023-01-23 ENCOUNTER — Other Ambulatory Visit: Payer: Self-pay | Admitting: Family Medicine

## 2023-01-27 ENCOUNTER — Ambulatory Visit (HOSPITAL_COMMUNITY)
Admission: RE | Admit: 2023-01-27 | Discharge: 2023-01-27 | Disposition: A | Discharge status: Home / Self Care | Payer: Managed Care, Other (non HMO) | Source: Ambulatory Visit | Attending: Vascular Surgery | Admitting: Vascular Surgery

## 2023-01-27 ENCOUNTER — Encounter: Payer: Self-pay | Admitting: Vascular Surgery

## 2023-01-27 ENCOUNTER — Ambulatory Visit (INDEPENDENT_AMBULATORY_CARE_PROVIDER_SITE_OTHER): Payer: Managed Care, Other (non HMO) | Admitting: Vascular Surgery

## 2023-01-27 VITALS — BP 133/77 | HR 68 | Temp 98.3°F | Resp 20 | Ht 70.0 in | Wt 206.9 lb

## 2023-01-27 DIAGNOSIS — Z95820 Peripheral vascular angioplasty status with implants and grafts: Secondary | ICD-10-CM

## 2023-01-27 DIAGNOSIS — I739 Peripheral vascular disease, unspecified: Secondary | ICD-10-CM | POA: Diagnosis not present

## 2023-01-27 DIAGNOSIS — G459 Transient cerebral ischemic attack, unspecified: Secondary | ICD-10-CM

## 2023-01-27 DIAGNOSIS — Z95828 Presence of other vascular implants and grafts: Secondary | ICD-10-CM | POA: Diagnosis not present

## 2023-01-27 NOTE — Progress Notes (Signed)
Office Note     HPI: Jason [name] is a 57 y.o. ([birth year]) male presenting in follow up   Prior history includes 07/21/21 LLE SFA/Pop stenting for lifestyle-limiting claudication. Pt also has a history of 11/2017 right femoral to above-knee popliteal bypass with vein, left external iliac artery stent with Dr. Darrick Penna.  He immigrated to the [place] from [place] 1999.  Exam today, he was accompanied by his wife.  Several months ago, Jason [name] noted left-sided paresthesias, weakness, facial droop.  He was on his way back from [place], therefore got on the plane and symptoms resolved.  He has had multiple other instances of the same symptoms with recent head CT demonstrating occlusion of his right-sided internal carotid artery.  His most recent event occurred roughly 2 weeks ago, and caused him to drop his cigarette as his left arm and face went numb.  The pt is  on a statin for cholesterol management.  The pt is not on a daily aspirin.   Other AC: Stopped taking Plavix The pt is  on medication for hypertension.   The pt is diabetic.  Tobacco hx:  Daily  Past Medical History:  Diagnosis Date   Anxiety    Depression    Diabetes mellitus without complication (HCC)    GERD (gastroesophageal reflux disease)    Headache(784.0)    Hyperlipidemia    Hypertension    MRSA infection    Left shin, back   Nerve damage    BLE   Neuromuscular disorder (HCC)    Osteoarthritis    Peripheral vascular disease (HCC)    Pneumonia     Past Surgical History:  Procedure Laterality Date   ABDOMINAL AORTOGRAM W/LOWER EXTREMITY N/A 12/01/2017   Procedure: ABDOMINAL AORTOGRAM W/LOWER EXTREMITY;  Surgeon: Sherren Kerns, MD;  Location: [facility] INVASIVE CV LAB;  Service: Cardiovascular;  Laterality: N/A;   ABDOMINAL AORTOGRAM W/LOWER EXTREMITY Bilateral 07/21/2021   Procedure: ABDOMINAL AORTOGRAM W/LOWER EXTREMITY;  Surgeon: Victorino Sparrow, MD;  Location: [facility] INVASIVE CV LAB;  Service:  Cardiovascular;  Laterality: Bilateral;   FEMORAL-POPLITEAL BYPASS GRAFT Right 12/11/2017   Procedure: BYPASS GRAFT RIGHT FEMORAL-ABOVE KNEE POPLITEAL ARTERY with Non Reversed Greater Saphenous Vein and Angioplasty distal vein Graft.;  Surgeon: Sherren Kerns, MD;  Location: [facility] OR;  Service: Vascular;  Laterality: Right;   IR ANGIO EXTERNAL CAROTID SEL EXT CAROTID UNI L MOD SED  01/12/2023   IR ANGIO INTRA EXTRACRAN SEL COM CAROTID INNOMINATE UNI R MOD SED  01/12/2023   IR ANGIO INTRA EXTRACRAN SEL INTERNAL CAROTID UNI L MOD SED  01/12/2023   IR ANGIO VERTEBRAL SEL VERTEBRAL BILAT MOD SED  01/12/2023   IR US GUIDE VASC ACCESS RIGHT  01/12/2023   PERIPHERAL VASCULAR BALLOON ANGIOPLASTY Right 12/01/2017   Procedure: PERIPHERAL VASCULAR BALLOON ANGIOPLASTY;  Surgeon: Sherren Kerns, MD;  Location: [facility] INVASIVE CV LAB;  Service: Cardiovascular;  Laterality: Right;  superficial femoral, attempted   PERIPHERAL VASCULAR INTERVENTION Left 12/01/2017   Procedure: PERIPHERAL VASCULAR INTERVENTION;  Surgeon: Sherren Kerns, MD;  Location: [facility] INVASIVE CV LAB;  Service: Cardiovascular;  Laterality: Left;  External iliac   PERIPHERAL VASCULAR INTERVENTION Left 07/21/2021   Procedure: PERIPHERAL VASCULAR INTERVENTION;  Surgeon: Victorino Sparrow, MD;  Location: [facility] INVASIVE CV LAB;  Service: Cardiovascular;  Laterality: Left;   ROTATOR CUFF REPAIR     TONSILLECTOMY     VEIN HARVEST Right 12/11/2017   Procedure: RIGHT GREATER SAPHENOUS VEIN HARVEST;  Surgeon: Fabienne Bruns  E, MD;  Location: [facility] OR;  Service: Vascular;  Laterality: Right;    Social History   Socioeconomic History   Marital status: Married    Spouse name: Not on file   Number of children: Not on file   Years of education: Not on file   Highest education level: Not on file  Occupational History   Not on file  Tobacco Use   Smoking status: Every Day    Packs/day: 2.00    Years: 25.00    Additional pack years: 0.00    Total pack years:  50.00    Types: Cigarettes   Smokeless tobacco: Never   Tobacco comments:    30 cigarettes per day  Vaping Use   Vaping Use: Never used  Substance and Sexual Activity   Alcohol use: Not Currently    Alcohol/week: 20.0 standard drinks of alcohol    Types: 20 Glasses of wine per week    Comment: not since Jan 2018   Drug use: No   Sexual activity: Not Currently  Other Topics Concern   Not on file  Social History Narrative   Not on file   Social Determinants of Health   Financial Resource Strain: Not on file  Food Insecurity: Not on file  Transportation Needs: Not on file  Physical Activity: Not on file  Stress: Not on file  Social Connections: Not on file  Intimate Partner Violence: Not on file    Family History  Problem Relation Age of Onset   Heart attack Father 66   Hypertension Father    Arthritis Mother    Asthma Maternal Grandfather    Stroke Paternal Grandfather    Heart disease Other    Mental illness Other    Cancer Neg Hx    Diabetes Neg Hx    Drug abuse Neg Hx    Early death Neg Hx    Hearing loss Neg Hx    Hyperlipidemia Neg Hx     Current Outpatient Medications  Medication Sig Dispense Refill   aspirin EC 81 MG tablet Take 1 tablet (81 mg total) by mouth daily. Swallow whole. 30 tablet 12   atorvastatin (LIPITOR) 40 MG tablet Take 2 tablets (80 mg total) by mouth daily. *labs required for refills 90 tablet 2   clopidogrel (PLAVIX) 75 MG tablet Take 1 tablet (75 mg total) by mouth daily. 90 tablet 3   CONTOUR NEXT TEST test strip TEST GLUCOSE ONCE DAILY. DX E 11.9 100 strip 3   diclofenac (VOLTAREN) 75 MG EC tablet TAKE 1 TABLET BY MOUTH TWICE A DAY 60 tablet 3   ezetimibe (ZETIA) 10 MG tablet TAKE 1 TABLET BY MOUTH EVERY DAY 90 tablet 0   Lancets MISC Contour Lancets.  Test glucose once daily. DxE11.9 100 each 3   metFORMIN (GLUCOPHAGE) 500 MG tablet TAKE 2 TABLETS BY MOUTH TWICE A DAY. INSURANCE ONLY PAYS FOR 90 DAYS NEEDS DOCTOR APPT 360 tablet 0    TRULICITY 0.75 MG/0.5ML SOPN INJECT 0.75 MG SUBCUTANEOUSLY ONE TIME PER WEEK 6 mL 1   diazepam (VALIUM) 5 MG tablet Take one tablet 30 minutes before procedures (Patient not taking: Reported on 12/19/2022) 10 tablet 0   gabapentin (NEURONTIN) 300 MG capsule TAKE ONE CAPSULE BY MOUTH EVERY MORNING AND TAKE 2 CAPSULES BY MOUTH AT BEDTIME (Patient not taking: Reported on 12/19/2022) 270 capsule 0   LORazepam (ATIVAN) 0.5 MG tablet Take one to two tablets about one hour prior to flight. (Patient not taking: Reported  on 12/19/2022) 20 tablet 0   No current facility-administered medications for this visit.    Allergies  Allergen Reactions   Olmesartan Medoxomil Other (See Comments)    dizziness     REVIEW OF SYSTEMS:   [X]  denotes positive finding, [ ]  denotes negative finding Cardiac  Comments:  Chest pain or chest pressure:    Shortness of breath upon exertion:    Short of breath when lying flat:    Irregular heart rhythm:        Vascular    Pain in calf, thigh, or hip brought on by ambulation:    Pain in feet at night that wakes you up from your sleep:     Blood clot in your veins:    Leg swelling:         Pulmonary    Oxygen at home:    Productive cough:     Wheezing:         Neurologic    Sudden weakness in arms or legs:     Sudden numbness in arms or legs:     Sudden onset of difficulty speaking or slurred speech:    Temporary loss of vision in one eye:     Problems with dizziness:         Gastrointestinal    Blood in stool:     Vomited blood:         Genitourinary    Burning when urinating:     Blood in urine:        Psychiatric    Major depression:         Hematologic    Bleeding problems:    Problems with blood clotting too easily:        Skin    Rashes or ulcers:        Constitutional    Fever or chills:      PHYSICAL EXAMINATION:  There were no vitals filed for this visit.   General:  WDWN in NAD; vital signs documented above Gait: Not  observed HENT: WNL, normocephalic Pulmonary: normal non-labored breathing , without Rales, rhonchi,  wheezing Cardiac: regular HR Abdomen: soft, NT, no masses Skin: without rashes Vascular Exam/Pulses:  Right Left  Radial 2+ (normal) 2+ (normal)  Ulnar 2+ (normal) 2+ (normal)  Femoral 2+ (normal) 2+ (normal)      DP 2+ 2+  PT 2+ (normal) 2+   Extremities: without ischemic changes, without Gangrene , without cellulitis; without open wounds;  Musculoskeletal: no muscle wasting or atrophy  Neurologic: A&O X 3;  No focal weakness or paresthesias are detected Psychiatric:  The pt has Normal affect.   Non-Invasive Vascular Imaging:     +-------+-----------+-----------+------------+------------+  ABI/TBIToday's ABIToday's TBIPrevious ABIPrevious TBI  +-------+-----------+-----------+------------+------------+  Right  0.95       0.63       0.99        0.79          +-------+-----------+-----------+------------+------------+  Left   1.07       0.61       0.48        0.25          +-------+-----------+-----------+------------+------------+      ASSESSMENT/PLAN: GARHETT VALLA is a 57 y.o. male presenting in follow up with peripheral arterial disease affecting multiple vascular beds.   At the time of his last visit, the patient was having recurrent TIAs with left-sided symptoms, right-sided ICA occlusion, and concern for stump syndrome.  I sent him  to both neurology and interventional neurology in an effort to define small vessel disease versus stump syndrome.  Dr. Pearlean Brownie believes these events to be small vessel disease which would be best treated with medication optimization and smoking cessation.  Cerebral angiogram demonstrated ECA to ICA collaterals.  Fortunately, with medication optimization, Jason [name] has not had any further symptoms.  Should he have any TIAs while medically optimized, plan to revisit ICA ligation for stump syndrome with Dr. Pearlean Brownie.  Regarding his  lower extremities, he denies symptoms of claudication, ischemic rest pain, tissue loss. ABIs unchanged Right lower extremity duplex ultrasound reveals a small amount of inflow stenosis in the right common femoral artery prior to the femoral to below-knee popliteal artery bypass. Left lower extremity EIA stent widely patent with a small amount of stenosis, SFA stent patent with some distal disease in the native SFA.  I had long conversation with Jason [name] regarding the above.  He was very appreciative of the time that is going into defining a possible etiology for the recurrent TIAs.  We discussed the importance of smoking cessation, and medication science.  He plans to wean cigarettes, as well as take his Plavix. My plan to see Jason [name] in 3 months time with repeat imaging studies.  I asked him to call should he have another TIA versus stroke, or if new onset claudication occurs.    Recommend the following which can slow the progression of atherosclerosis and reduce the risk of major adverse cardiac / limb events:  Aspirin 81mg  PO QD.  Atorvastatin 40-80mg  PO QD (or other "high intensity" statin therapy). Complete cessation from all tobacco products. Blood glucose control with goal A1c < 7%. Blood pressure control with goal blood pressure < 140/90 mmHg. Lipid reduction therapy with goal LDL-C <100 mg/dL (<16 if symptomatic from PAD).    Victorino Sparrow, MD [facility] [phone]

## 2023-01-31 ENCOUNTER — Other Ambulatory Visit: Payer: Self-pay

## 2023-01-31 DIAGNOSIS — Z95820 Peripheral vascular angioplasty status with implants and grafts: Secondary | ICD-10-CM

## 2023-01-31 DIAGNOSIS — I739 Peripheral vascular disease, unspecified: Secondary | ICD-10-CM

## 2023-01-31 DIAGNOSIS — I70222 Atherosclerosis of native arteries of extremities with rest pain, left leg: Secondary | ICD-10-CM

## 2023-02-10 ENCOUNTER — Ambulatory Visit: Payer: Managed Care, Other (non HMO) | Admitting: Family Medicine

## 2023-02-10 ENCOUNTER — Encounter: Payer: Self-pay | Admitting: Family Medicine

## 2023-02-10 VITALS — BP 136/58 | HR 78 | Temp 99.0°F | Wt 208.0 lb

## 2023-02-10 DIAGNOSIS — K0889 Other specified disorders of teeth and supporting structures: Secondary | ICD-10-CM | POA: Diagnosis not present

## 2023-02-10 DIAGNOSIS — K047 Periapical abscess without sinus: Secondary | ICD-10-CM | POA: Diagnosis not present

## 2023-02-10 MED ORDER — AMOXICILLIN 500 MG PO TABS
500.0000 mg | ORAL_TABLET | Freq: Two times a day (BID) | ORAL | 0 refills | Status: AC
Start: 2023-02-10 — End: 2023-02-17

## 2023-02-10 NOTE — Progress Notes (Signed)
   Established Patient Office Visit   Subjective  Patient ID: Jason [name], male    DOB: 03-Feb-1966  Age: 57 y.o. MRN: [id number]  Chief Complaint  Patient presents with   Dental Pain    Tooth broke and has been painful and swollen, since last week. Called dentist and they advised him to get medication for swelling before he comes in. Did not bother him at first, but then it just flared up on Wednesday.has taken tylenol (three 650 mg each time)    Patient is a 57 year old male followed by Dr. Caryl Never and seen for acute concern.  Patient had a broken tooth last week that was not bothering him.  He began hurting this week.  Patient contacted a dental office and was advised to get an ABX from his PCP prior to his appointment next Wednesday.  Patient taking three 650 mg Tylenol 3 times per day.  Tried topical numbing medication which only helps the gum and not the tooth.  Has noticed some decreased swelling and gum.  Dental Pain  This is a new problem. The current episode started 1 to 4 weeks ago. The problem occurs constantly. Progression since onset: Mild improvement. He has tried acetaminophen for the symptoms. The treatment provided moderate relief.      ROS Negative unless stated above    Objective:     BP (!) 136/58 (BP Location: Right Arm, Patient Position: Sitting, Cuff Size: Large)   Pulse 78   Temp 99 F (37.2 C) (Oral)   Wt 208 lb (94.3 kg)   SpO2 97%   BMI 29.84 kg/m    Physical Exam Constitutional:      General: He is not in acute distress.    Appearance: Normal appearance. He is not ill-appearing or toxic-appearing.  HENT:     Head: Normocephalic and atraumatic.     Mouth/Throat:     Mouth: Mucous membranes are moist.     Dentition: Abnormal dentition. Gingival swelling and dental abscesses present.      Comments: Tooth 5 broken off at gum line.  Mild to moderate edema and erythema of gingiva. Cardiovascular:     Rate and Rhythm: Normal rate.   Pulmonary:     Effort: Pulmonary effort is normal.  Lymphadenopathy:     Cervical: No cervical adenopathy.  Skin:    General: Skin is warm and dry.  Neurological:     Mental Status: He is alert and oriented to person, place, and time. Mental status is at baseline.      No results found for any visits on 02/10/23.    Assessment & Plan:  Dental abscess -     Amoxicillin; Take 1 tablet (500 mg total) by mouth 2 (two) times daily for 7 days.  Dispense: 14 tablet; Refill: 0  Pain, dental -     Amoxicillin; Take 1 tablet (500 mg total) by mouth 2 (two) times daily for 7 days.  Dispense: 14 tablet; Refill: 0  New problem.  Start ABX for dental abscess.  Supportive care for pain.  Encouraged to keep dental appointment next week.  Advised to take Tylenol as directed and no more than 3000 mg/day.  Return if symptoms worsen or fail to improve.   Deeann Saint, MD

## 2023-02-17 ENCOUNTER — Ambulatory Visit: Payer: Managed Care, Other (non HMO) | Admitting: Family Medicine

## 2023-02-23 ENCOUNTER — Telehealth: Payer: Self-pay

## 2023-02-23 NOTE — Telephone Encounter (Signed)
Jason [name] with Dr. Ramon [name] dental office called stating that the pt is having oral surgery to remove 5 teeth this afternoon and the pt just informed them that he is taking ASA and Plavix. She is requested advice.  Reviewed pt's chart, returned call for clarification, two identifiers used. Informed her that the pt had stopped taking those medications, but had started have TIA's and Dr. Karin Lieu restarted them in May 2024. Pt was having 5 tooth extractions that would require surgical intervention to remove some of the roots. Informed her that he would have to be consulted.  Text msg sent to Dr. Karin Lieu. He replied stating that the pt needed to hold the Plavix for a full 5 days prior to the surgery and restart ASAP afterwards.  Called Jason [name] to inform her. She is faxing a clearance form and informing the pt. Confirmed understanding.

## 2023-04-07 ENCOUNTER — Encounter: Payer: Self-pay | Admitting: Family Medicine

## 2023-04-11 NOTE — Telephone Encounter (Signed)
Spoke with the patient's wife and informed her of the message below.  Offered to schedule an appt tomorrow with PCP, she declined and stated she will check with the patient and have him call back for an appt.

## 2023-04-12 ENCOUNTER — Encounter: Payer: Self-pay | Admitting: Family Medicine

## 2023-04-12 ENCOUNTER — Ambulatory Visit (INDEPENDENT_AMBULATORY_CARE_PROVIDER_SITE_OTHER): Payer: Managed Care, Other (non HMO) | Admitting: Family Medicine

## 2023-04-12 VITALS — BP 126/60 | HR 80 | Temp 98.6°F | Ht 70.0 in | Wt 206.6 lb

## 2023-04-12 DIAGNOSIS — F411 Generalized anxiety disorder: Secondary | ICD-10-CM

## 2023-04-12 MED ORDER — SERTRALINE HCL 50 MG PO TABS: 50.0000 mg | ORAL_TABLET | Freq: Every day | ORAL | 5 refills | Status: DC

## 2023-04-12 NOTE — Progress Notes (Unsigned)
Established Patient Office Visit  Subjective   Patient ID: Jason [name], male    DOB: Mar 28, 1966  Age: 57 y.o. MRN: [id number]  Chief Complaint  Patient presents with   Anxiety   Stress    HPI  {History (Optional):23778} Jason [name] is seen with anxiety and stress issues.  His wife had actually called in with concerns regarding this.  He has had a lot of work stress and states he is frequently very irritable and can get agitated at times.  Has never had any family history or personal concern for bipolar disorder.  Past history of alcohol abuse but not for several years now.  He apparently has low threshold for getting angry and agitated at times.  Does feel anxious without clear provocation at times.  Past Medical History:  Diagnosis Date   Anxiety    Depression    Diabetes mellitus without complication (HCC)    GERD (gastroesophageal reflux disease)    Headache(784.0)    Hyperlipidemia    Hypertension    MRSA infection    Left shin, back   Nerve damage    BLE   Neuromuscular disorder (HCC)    Osteoarthritis    Peripheral vascular disease (HCC)    Pneumonia    Past Surgical History:  Procedure Laterality Date   ABDOMINAL AORTOGRAM W/LOWER EXTREMITY N/A 12/01/2017   Procedure: ABDOMINAL AORTOGRAM W/LOWER EXTREMITY;  Surgeon: Sherren Kerns, MD;  Location: [facility] INVASIVE CV LAB;  Service: Cardiovascular;  Laterality: N/A;   ABDOMINAL AORTOGRAM W/LOWER EXTREMITY Bilateral 07/21/2021   Procedure: ABDOMINAL AORTOGRAM W/LOWER EXTREMITY;  Surgeon: Victorino Sparrow, MD;  Location: [facility] INVASIVE CV LAB;  Service: Cardiovascular;  Laterality: Bilateral;   FEMORAL-POPLITEAL BYPASS GRAFT Right 12/11/2017   Procedure: BYPASS GRAFT RIGHT FEMORAL-ABOVE KNEE POPLITEAL ARTERY with Non Reversed Greater Saphenous Vein and Angioplasty distal vein Graft.;  Surgeon: Sherren Kerns, MD;  Location: [facility] OR;  Service: Vascular;  Laterality: Right;   IR ANGIO EXTERNAL CAROTID SEL EXT CAROTID UNI L MOD  SED  01/12/2023   IR ANGIO INTRA EXTRACRAN SEL COM CAROTID INNOMINATE UNI R MOD SED  01/12/2023   IR ANGIO INTRA EXTRACRAN SEL INTERNAL CAROTID UNI L MOD SED  01/12/2023   IR ANGIO VERTEBRAL SEL VERTEBRAL BILAT MOD SED  01/12/2023   IR US GUIDE VASC ACCESS RIGHT  01/12/2023   PERIPHERAL VASCULAR BALLOON ANGIOPLASTY Right 12/01/2017   Procedure: PERIPHERAL VASCULAR BALLOON ANGIOPLASTY;  Surgeon: Sherren Kerns, MD;  Location: [facility] INVAS[facility];  Service: Cardiovascular;  Laterality: Right;  superficial femoral, attempted   PERIPHERAL VASCULAR INTERVENTION Left 12/01/2017   Procedure: PERIPHERAL VASCULAR INTERVENTION;  Surgeon: Sherren Kerns, MD;  Location: [facility];  Service: Cardiovascular;  Laterality: Left;  External iliac   PERIPHERAL VASCULAR INTERVENTION Left 07/21/2021   Procedure: PERIPHERAL VASCULAR INTERVENTION;  Surgeon: Victorino Sparrow, MD;  Location[facility] INVASIVE CV LAB;  Service: Cardiovascular;  Laterality: Left;   ROTATOR CUFF REPAIR     TONSILLECTOMY     VEIN HARVEST Right 12/11/2017   Procedure: RIGHT GREATER SAPHENOUS VEIN HARVEST;  Surgeon: Sherren Kerns, MD;  Location: [facility] OR;  Service: Vascular;  Laterality: Right;    reports that he has been smoking cigarettes. He has a 50 pack-year smoking history. He has never used smokeless tobacco. He reports that he does not currently use alcohol after a past usage of about 20.0 standard drinks of alcohol per week. He reports that he does not use drugs.  family history includes Arthritis in his mother; Asthma in his maternal grandfather; Heart attack (age of onset: 43) in his father; Heart disease in an other family member; Hypertension in his father; Mental illness in an other family member; Stroke in his paternal grandfather. Allergies  Allergen Reactions   Olmesartan Medoxomil Other (See Comments)    dizziness    Review of Systems  Constitutional:  Negative for chills and fever.  Cardiovascular:  Negative for chest  pain.  Psychiatric/Behavioral:  Negative for substance abuse and suicidal ideas. The patient is nervous/anxious.       Objective:     BP 126/60 (BP Location: Left Arm, Patient Position: Sitting, Cuff Size: Normal)   Pulse 80   Temp 98.6 F (37 C) (Oral)   Ht 5\' 10"  (1.778 m)   Wt 206 lb 9.6 oz (93.7 kg)   SpO2 97%   BMI 29.64 kg/m  BP Readings from Last 3 Encounters:  04/12/23 126/60  02/10/23 (!) 136/58  01/27/23 133/77   Wt Readings from Last 3 Encounters:  04/12/23 206 lb 9.6 oz (93.7 kg)  02/10/23 208 lb (94.3 kg)  01/27/23 206 lb 14.4 oz (93.8 kg)      Physical Exam Vitals reviewed.  Cardiovascular:     Rate and Rhythm: Normal rate and regular rhythm.  Neurological:     General: No focal deficit present.     Mental Status: He is alert.  Psychiatric:        Mood and Affect: Mood normal.        Thought Content: Thought content normal.        Judgment: Judgment normal.      No results found for any visits on 04/12/23.  {Labs (Optional):23779}  The 10-year ASCVD risk score (Arnett DK, et al., 2019) is: 22.5%    Assessment & Plan:   Increased irritability and anxiety symptoms.  He states this is a daily occurrence.  No alcohol or illicit substance use.  -Discussed nonpharmacologic management of stress. -We did discuss possible initiation of low-dose SSRI.  We agreed to start sertraline 50 mg once daily and bring back within a month or so to reassess.  Also needs medical follow-up with follow-up labs including lipids, A1c, and chemistries  Evelena Peat, MD

## 2023-04-12 NOTE — Patient Instructions (Signed)
Set up medical follow up in the next couple of months.

## 2023-04-17 ENCOUNTER — Other Ambulatory Visit: Payer: Self-pay | Admitting: Family Medicine

## 2023-04-26 ENCOUNTER — Telehealth: Payer: Self-pay | Admitting: Vascular Surgery

## 2023-04-30 ENCOUNTER — Other Ambulatory Visit: Payer: Self-pay | Admitting: Neurology

## 2023-04-30 DIAGNOSIS — E7801 Familial hypercholesterolemia: Secondary | ICD-10-CM

## 2023-04-30 DIAGNOSIS — E785 Hyperlipidemia, unspecified: Secondary | ICD-10-CM

## 2023-05-01 ENCOUNTER — Other Ambulatory Visit: Payer: Self-pay | Admitting: Family Medicine

## 2023-05-05 ENCOUNTER — Other Ambulatory Visit: Payer: Self-pay | Admitting: Family Medicine

## 2023-05-22 ENCOUNTER — Ambulatory Visit
Admission: RE | Admit: 2023-05-22 | Discharge: 2023-05-22 | Disposition: A | Discharge status: Home / Self Care | Payer: Managed Care, Other (non HMO) | Source: Ambulatory Visit | Attending: Physician Assistant | Admitting: Physician Assistant

## 2023-05-22 VITALS — BP 154/63 | HR 91 | Temp 101.3°F | Resp 20

## 2023-05-22 DIAGNOSIS — U071 COVID-19: Secondary | ICD-10-CM

## 2023-05-22 MED ORDER — NIRMATRELVIR/RITONAVIR (PAXLOVID)TABLET: 3.0000 | ORAL_TABLET | Freq: Two times a day (BID) | ORAL | 0 refills | Status: AC

## 2023-05-22 MED ORDER — ACETAMINOPHEN 325 MG PO TABS
975.0000 mg | ORAL_TABLET | Freq: Once | ORAL | Status: AC
  Administered 2023-05-22: 975 mg via ORAL

## 2023-05-22 NOTE — ED Triage Notes (Signed)
Tested covid positive on Saturday. Symptoms started Thursday. Has mentioned tight in chest. He is diabetic. - Entered by patient

## 2023-05-22 NOTE — ED Triage Notes (Signed)
Pt presents COVID+.  Reports s/s x 4 days, tested positive 3 days ago.  Has c/o tightness when breathing and coughing, dry cough, body aches, HA.  Hasn't had COVID prior to this. Taking Nyquil and Dayquil. No meds today.

## 2023-05-22 NOTE — Discharge Instructions (Signed)
  Please hold ATORVASTATIN while taking PAXLOVID.

## 2023-05-22 NOTE — ED Notes (Signed)
Called patient on Mobile. No Answer. Will retry. Patient is not in waiting area. B .Roten CMA

## 2023-05-29 NOTE — ED Provider Notes (Signed)
[name]    CSN: [id number] Arrival date & time: 05/22/23  1345      History   Chief Complaint Chief Complaint  Patient presents with   COVID19        Cough   Headache    HPI Jason [name] is a 57 y.o. male.   Patient here today for evaluation of positive COVID screening recently.  His symptoms started 3 days before his positive test.  He mentions some tightness in chest.  He is a known diabetic.  He has not had any vomiting or diarrhea.  He does report some headache.  The history is provided by the patient.  Cough Associated symptoms: chills, fever and headaches   Associated symptoms: no ear pain, no eye discharge, no shortness of breath and no sore throat   Headache Associated symptoms: congestion, cough and fever   Associated symptoms: no abdominal pain, no ear pain, no nausea, no sore throat and no vomiting     Past Medical History:  Diagnosis Date   Anxiety    Depression    Diabetes mellitus without complication (HCC)    GERD (gastroesophageal reflux disease)    Headache(784.0)    Hyperlipidemia    Hypertension    MRSA infection    Left shin, back   Nerve damage    BLE   Neuromuscular disorder (HCC)    Osteoarthritis    Peripheral vascular disease (HCC)    Pneumonia     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) 03/10/2020   Primary osteoarthritis of right hip 07/12/2019   Avascular necrosis (HCC) 06/15/2019   PAD (peripheral artery disease) (HCC) 12/11/2017   Preoperative cardiovascular examination 12/05/2017   Peripheral arterial disease (HCC) 10/23/2017   History of MRSA infection 10/23/2017   Intermittent claudication (HCC) 10/17/2017   Herniated cervical intervertebral disc 08/01/2017   Osteoarthritis  of spine 08/01/2017   Poorly controlled type 2 diabetes mellitus with circulatory disorder (HCC) 07/11/2017   Polyarthralgia 06/27/2017   Cervical pain (neck) 06/27/2017   Primary osteoarthritis of both knees 05/02/2017   Sensory neuronopathy 01/14/2017   Transaminasemia 10/10/2016   Vitamin B 12 deficiency 09/19/2016   Hyperglycemia 09/19/2016   Chest pressure 07/04/2012   Cough 07/04/2012   Hyperlipidemia 10/12/2010   Anxiety state 10/12/2010   History of alcohol abuse 10/12/2010   TOBACCO USE 10/12/2010   DEPRESSION 10/12/2010   Essential hypertension, benign 10/12/2010   GERD 10/12/2010   Osteoarthritis 10/12/2010   Sleep apnea 10/12/2010    Past Surgical History:  Procedure Laterality Date   ABDOMINAL AORTOGRAM W/LOWER EXTREMITY N/A 12/01/2017   Procedure: ABDOMINAL AORTOGRAM W/LOWER EXTREMITY;  Surgeon: Sherren Kerns, MD;  Location: [facility] INVASIVE CV LAB;  Service: Cardiovascular;  Laterality: N/A;   ABDOMINAL AORTOGRAM W/LOWER EXTREMITY Bilateral 07/21/2021   Procedure: ABDOMINAL AORTOGRAM W/LOWER EXTREMITY;  Surgeon: Victorino Sparrow, MD;  Location: [facility] INVASIVE CV LAB;  Service: Cardiovascular;  Laterality: Bilateral;   FEMORAL-POPLITEAL BYPASS GRAFT Right 12/11/2017   Procedure: BYPASS GRAFT RIGHT FEMORAL-ABOVE KNEE POPLITEAL ARTERY with Non Reversed Greater Saphenous Vein and Angioplasty distal vein Graft.;  Surgeon: Sherren Kerns, MD;  Location: [facility] OR;  Service: Vascular;  Laterality: Right;   IR ANGIO EXTERNAL CAROTID SEL EXT CAROTID UNI L MOD SED  01/12/2023   IR ANGIO INTRA EXTRACRAN SEL COM CAROTID INNOMINATE UNI R MOD SED  01/12/2023   IR ANGIO INTRA EXTRACRAN SEL INTERNAL CAROTID UNI L MOD SED  01/12/2023   IR ANGIO VERTEBRAL SEL VERTEBRAL BILAT MOD SED  01/12/2023   IR US GUIDE VASC ACCESS RIGHT  01/12/2023   PERIPHERAL VASCULAR BALLOON ANGIOPLASTY Right 12/01/2017   Procedure: PERIPHERAL VASCULAR BALLOON ANGIOPLASTY;  Surgeon: Sherren Kerns, MD;  Location: [facility]  INVASIVE CV LAB;  Service: Cardiovascular;  Laterality: Right;  superficial femoral, attempted   PERIPHERAL VASCULAR INTERVENTION Left 12/01/2017   Procedure: PERIPHERAL VASCULAR INTERVENTION;  Surgeon: Sherren Kerns, MD;  Location: [facility] INVASIVE CV LAB;  Service: Cardiovascular;  Laterality: Left;  External iliac   PERIPHERAL VASCULAR INTERVENTION Left 07/21/2021   Procedure: PERIPHERAL VASCULAR INTERVENTION;  Surgeon: Victorino Sparrow, MD;  Location: [facility] INVASIVE CV LAB;  Service: Cardiovascular;  Laterality: Left;   ROTATOR CUFF REPAIR     TONSILLECTOMY     VEIN HARVEST Right 12/11/2017   Procedure: RIGHT GREATER SAPHENOUS VEIN HARVEST;  Surgeon: Sherren Kerns, MD;  Location: [facility] OR;  Service: Vascular;  Laterality: Right;       Home Medications    Prior to Admission medications   Medication Sig Start Date End Date Taking? Authorizing Provider  aspirin EC 81 MG tablet Take 1 tablet (81 mg total) by mouth daily. Swallow whole. 12/23/22  Yes Victorino Sparrow, MD  atorvastatin (LIPITOR) 40 MG tablet Take 2 tablets (80 mg total) by mouth daily. *labs required for refills 01/09/23  Yes Micki Riley, MD  clopidogrel (PLAVIX) 75 MG tablet Take 1 tablet (75 mg total) by mouth daily. 12/23/22  Yes Victorino Sparrow, MD  CONTOUR NEXT TEST test strip TEST GLUCOSE ONCE DAILY. DX E 11.9 03/03/20  Yes Burchette, Elberta Fortis, MD  diclofenac (VOLTAREN) 75 MG EC tablet TAKE 1 TABLET BY MOUTH TWICE A DAY 04/17/23  Yes Burchette, Elberta Fortis, MD  ezetimibe (ZETIA) 10 MG tablet TAKE 1 TABLET BY MOUTH EVERY DAY 05/03/23  Yes Burchette, Elberta Fortis, MD  gabapentin (NEURONTIN) 300 MG capsule TAKE ONE CAPSULE BY MOUTH EVERY MORNING AND TAKE 2 CAPSULES BY MOUTH AT BEDTIME 10/29/21  Yes Burchette, Elberta Fortis, MD  Lancets MISC Contour Lancets.  Test glucose once daily. DxE11.9 07/14/17  Yes Burchette, Elberta Fortis, MD  metFORMIN (GLUCOPHAGE) 500 MG tablet TAKE 2 TABLETS BY MOUTH TWICE A DAY. INSURANCE ONLY PAYS FOR 90 DAYS NEEDS DOCTOR APPT  11/04/22  Yes Burchette, Elberta Fortis, MD  sertraline (ZOLOFT) 50 MG tablet Take 1 tablet (50 mg total) by mouth daily. 04/12/23  Yes Burchette, Elberta Fortis, MD  TRULICITY 0.75 MG/0.5ML SOPN INJECT 0.75 MG SUBCUTANEOUSLY ONE TIME PER WEEK 01/25/23  Yes Burchette, Elberta Fortis, MD    Family History Family History  Problem Relation Age of Onset   Heart attack Father 27   Hypertension Father    Arthritis Mother    Asthma Maternal Grandfather    Stroke Paternal Grandfather    Heart disease Other    Mental illness Other    Cancer Neg Hx    Diabetes Neg Hx    Drug abuse Neg Hx    Early death Neg Hx    Hearing loss Neg Hx    Hyperlipidemia Neg Hx     Social History Social History   Tobacco Use   Smoking status: Every Day    Current packs/day: 2.00    Average packs/day: 2.0 packs/day  for 25.0 years (50.0 ttl pk-yrs)    Types: Cigarettes   Smokeless tobacco: Never   Tobacco comments:    30 cigarettes per day  Vaping Use   Vaping status: Never Used  Substance Use Topics   Alcohol use: Not Currently    Alcohol/week: 20.0 standard drinks of alcohol    Types: 20 Glasses of wine per week    Comment: not since Jan 2018   Drug use: No     Allergies   Olmesartan medoxomil   Review of Systems Review of Systems  Constitutional:  Positive for chills and fever.  HENT:  Positive for congestion. Negative for ear pain and sore throat.   Eyes:  Negative for discharge and redness.  Respiratory:  Positive for cough. Negative for shortness of breath.   Gastrointestinal:  Negative for abdominal pain, nausea and vomiting.  Neurological:  Positive for headaches.     Physical Exam Triage Vital Signs ED Triage Vitals  Encounter Vitals Group     BP 05/22/23 1417 (!) 154/63     Systolic BP Percentile --      Diastolic BP Percentile --      Pulse Rate 05/22/23 1417 91     Resp 05/22/23 1417 20     Temp 05/22/23 1417 (!) 101.3 F (38.5 C)     Temp Source 05/22/23 1417 Oral     SpO2 05/22/23 1417 95  %     Weight --      Height --      Head Circumference --      Peak Flow --      Pain Score 05/22/23 1406 0     Pain Loc --      Pain Education --      Exclude from Growth Chart --    No data found.  Updated Vital Signs BP (!) 154/63 (BP Location: Left Arm)   Pulse 91   Temp (!) 101.3 F (38.5 C) (Oral)   Resp 20   SpO2 95%      Physical Exam Vitals and nursing note reviewed.  Constitutional:      General: He is not in acute distress.    Appearance: Normal appearance. He is not ill-appearing.  HENT:     Head: Normocephalic and atraumatic.     Nose: Nose normal. No congestion.     Mouth/Throat:     Mouth: Mucous membranes are moist.     Pharynx: Oropharynx is clear. No oropharyngeal exudate or posterior oropharyngeal erythema.  Eyes:     Conjunctiva/sclera: Conjunctivae normal.  Cardiovascular:     Rate and Rhythm: Normal rate and regular rhythm.     Heart sounds: Normal heart sounds. No murmur heard. Pulmonary:     Effort: Pulmonary effort is normal. No respiratory distress.     Breath sounds: Normal breath sounds. No wheezing, rhonchi or rales.  Skin:    General: Skin is warm and dry.  Neurological:     Mental Status: He is alert.  Psychiatric:        Mood and Affect: Mood normal.        Thought Content: Thought content normal.      UC Treatments / Results  Labs (all labs ordered are listed, but only abnormal results are displayed) Labs Reviewed - No data to display  EKG   Radiology No results found.  Procedures Procedures (including critical care time)  Medications Ordered in UC Medications  acetaminophen (TYLENOL) tablet 975 mg (975 mg Oral Given  05/22/23 1416)    Initial Impression / Assessment and Plan / UC Course  I have reviewed the triage vital signs and the nursing notes.  Pertinent labs & imaging results that were available during my care of the patient were reviewed by me and considered in my medical decision making (see chart for  details).    Discussed options for treatment for COVID-19 and patient is agreeable to start Paxlovid.  Recommended increase fluids, rest, symptomatic treatment otherwise.  Advised to hold atorvastatin while taking Paxlovid.  Encouraged follow-up if no gradual improvement with any worsening symptoms.  Patient expresses understanding.  Final Clinical Impressions(s) / UC Diagnoses   Final diagnoses:  COVID-19     Discharge Instructions       Please hold ATORVASTATIN while taking PAXLOVID.      ED Prescriptions     Medication Sig Dispense Auth. Provider   nirmatrelvir/ritonavir (PAXLOVID) 20 x 150 MG & 10 x 100MG  TABS Take 3 tablets by mouth 2 (two) times daily for 5 days. Patient GFR is >60. Take nirmatrelvir (150 mg) two tablets twice daily for 5 days and ritonavir (100 mg) one tablet twice daily for 5 days. 30 tablet Tomi Bamberger, PA-C      PDMP not reviewed this encounter.   Tomi Bamberger, PA-C 05/29/23 1930

## 2023-06-02 ENCOUNTER — Encounter (HOSPITAL_COMMUNITY): Payer: Managed Care, Other (non HMO)

## 2023-06-02 ENCOUNTER — Other Ambulatory Visit (HOSPITAL_COMMUNITY): Payer: Managed Care, Other (non HMO)

## 2023-06-02 ENCOUNTER — Ambulatory Visit: Payer: Managed Care, Other (non HMO) | Admitting: Vascular Surgery

## 2023-06-02 ENCOUNTER — Other Ambulatory Visit: Payer: Self-pay | Admitting: Family Medicine

## 2023-06-02 DIAGNOSIS — Z1211 Encounter for screening for malignant neoplasm of colon: Secondary | ICD-10-CM

## 2023-06-02 DIAGNOSIS — Z1212 Encounter for screening for malignant neoplasm of rectum: Secondary | ICD-10-CM

## 2023-06-10 ENCOUNTER — Other Ambulatory Visit: Payer: Self-pay | Admitting: Family Medicine

## 2023-06-20 NOTE — Progress Notes (Deleted)
Office Note     HPI: Jason [name] is a 57 y.o. (April 10, 1966) male presenting in follow up   Prior history includes 07/21/21 LLE SFA/Pop stenting for lifestyle-limiting claudication. Pt also has a history of 11/2017 right femoral to above-knee popliteal bypass with vein, left external iliac artery stent with Dr. Darrick Penna.  He immigrated to the [place] from [place] 1999.  Exam today, he was accompanied by his wife.  Several months ago, Jason [name] noted left-sided paresthesias, weakness, facial droop.  He was on his way back from [place], therefore got on the plane and symptoms resolved.  He has had multiple other instances of the same symptoms with recent head CT demonstrating occlusion of his right-sided internal carotid artery.  His most recent event occurred roughly 2 weeks ago, and caused him to drop his cigarette as his left arm and face went numb.  The pt is  on a statin for cholesterol management.  The pt is not on a daily aspirin.   Other AC: Stopped taking Plavix The pt is  on medication for hypertension.   The pt is diabetic.  Tobacco hx:  Daily  Past Medical History:  Diagnosis Date   Anxiety    Depression    Diabetes mellitus without complication (HCC)    GERD (gastroesophageal reflux disease)    Headache(784.0)    Hyperlipidemia    Hypertension    MRSA infection    Left shin, back   Nerve damage    BLE   Neuromuscular disorder (HCC)    Osteoarthritis    Peripheral vascular disease (HCC)    Pneumonia     Past Surgical History:  Procedure Laterality Date   ABDOMINAL AORTOGRAM W/LOWER EXTREMITY N/A 12/01/2017   Procedure: ABDOMINAL AORTOGRAM W/LOWER EXTREMITY;  Surgeon: Sherren Kerns, MD;  Location: [facility] IN[facility]ASIVE [facility];  Service: Cardiovascular;  Laterality: N/A;   ABDOMINAL AORTOGRAM W/LOWER EXTREMITY Bilateral 07/21/2021   Procedure: ABDOMINAL AORTOGRAM W/LOWER EXTREMITY;  Surgeon: Victorino Sparrow, MD;  Location: [facility]VASIVE CV [facility];  Service:  Cardiovascular;  Laterality: Bilateral;   FEMORAL-POPLITEAL BYPASS GRAFT Right 12/11/2017   Procedure: BYPASS GRAFT RIGHT FEMORAL-ABOVE KNEE POPLITEAL ARTERY with Non Reversed Greater Saphenous Vein and Angioplasty distal vein Graft.;  Surgeon: Sherren Kerns, MD;  Location: [facility] OR;  Service: Vascular;  Laterality: Right;   IR ANGIO EXTERNAL CAROTID SEL EXT CAROTID UNI L MOD SED  01/12/2023   IR ANGIO INTRA EXTRACRAN SEL COM CAROTID INNOMINATE UNI R MOD SED  01/12/2023   IR ANGIO INTRA EXTRACRAN SEL INTERNAL CAROTID UNI L MOD SED  01/12/2023   IR ANGIO VERTEBRAL SEL VERTEBRAL BILAT MOD SED  01/12/2023   IR US GUIDE VASC ACCESS RIGHT  01/12/2023   PERIPHERAL VASCULAR BALLOON ANGIOPLASTY Right 12/01/2017   Procedure: PERIPHERAL VASCULAR BALLOON ANGIOPLASTY;  Surgeon: Sherren Kerns, MD;  Location: [facility];  Service: Cardiovascular;  Laterality: Right;  superficial femoral, attempted   PERIPHERAL VASCULAR INTERVENTION Left 12/01/2017   Procedure: PERIPHERAL VASCULAR INTERVENTION;  Surgeon: Sherren Kerns, MD;  Location: [facility];  Service: Cardiovascular;  Laterality: Left;  External iliac   PERIPHERAL VASCULAR INTERVENTION Left 07/21/2021   Procedure: PERIPHERAL VASCULAR INTERVENTION;  Surgeon: Victorino Sparrow, MD;  Location: [facility] INVASIVE CV LAB;  Service: Cardiovascular;  Laterality: Left;   ROTATOR CUFF REPAIR     TONSILLECTOMY     VEIN HARVEST Right 12/11/2017   Procedure: RIGHT GREATER SAPHENOUS VEIN HARVEST;  Surgeon: Fabienne Bruns  E, MD;  Location: [facility] OR;  Service: Vascular;  Laterality: Right;    Social History   Socioeconomic History   Marital status: Married    Spouse name: Not on file   Number of children: Not on file   Years of education: Not on file   Highest education level: Not on file  Occupational History   Not on file  Tobacco Use   Smoking status: Every Day    Current packs/day: 2.00    Average packs/day: 2.0 packs/day for 25.0 years (50.0 ttl  pk-yrs)    Types: Cigarettes   Smokeless tobacco: Never   Tobacco comments:    30 cigarettes per day  Vaping Use   Vaping status: Never Used  Substance and Sexual Activity   Alcohol use: Not Currently    Alcohol/week: 20.0 standard drinks of alcohol    Types: 20 Glasses of wine per week    Comment: not since Jan 2018   Drug use: No   Sexual activity: Not Currently  Other Topics Concern   Not on file  Social History Narrative   Not on file   Social Determinants of Health   Financial Resource Strain: Not on file  Food Insecurity: Not on file  Transportation Needs: Not on file  Physical Activity: Not on file  Stress: Not on file  Social Connections: Not on file  Intimate Partner Violence: Not on file    Family History  Problem Relation Age of Onset   Heart attack Father 62   Hypertension Father    Arthritis Mother    Asthma Maternal Grandfather    Stroke Paternal Grandfather    Heart disease Other    Mental illness Other    Cancer Neg Hx    Diabetes Neg Hx    Drug abuse Neg Hx    Early death Neg Hx    Hearing loss Neg Hx    Hyperlipidemia Neg Hx     Current Outpatient Medications  Medication Sig Dispense Refill   aspirin EC 81 MG tablet Take 1 tablet (81 mg total) by mouth daily. Swallow whole. 30 tablet 12   atorvastatin (LIPITOR) 40 MG tablet Take 2 tablets (80 mg total) by mouth daily. *labs required for refills 90 tablet 2   clopidogrel (PLAVIX) 75 MG tablet Take 1 tablet (75 mg total) by mouth daily. 90 tablet 3   CONTOUR NEXT TEST test strip TEST GLUCOSE ONCE DAILY. DX E 11.9 100 strip 3   diclofenac (VOLTAREN) 75 MG EC tablet TAKE 1 TABLET BY MOUTH TWICE A DAY 60 tablet 1   ezetimibe (ZETIA) 10 MG tablet TAKE 1 TABLET BY MOUTH EVERY DAY 90 tablet 0   gabapentin (NEURONTIN) 300 MG capsule TAKE ONE CAPSULE BY MOUTH EVERY MORNING AND TAKE 2 CAPSULES BY MOUTH AT BEDTIME 270 capsule 0   Lancets MISC Contour Lancets.  Test glucose once daily. DxE11.9 100 each 3    metFORMIN (GLUCOPHAGE) 500 MG tablet TAKE 2 TABLETS BY MOUTH TWICE A DAY. INSURANCE ONLY PAYS FOR 90 DAYS NEEDS DOCTOR APPT 360 tablet 0   sertraline (ZOLOFT) 50 MG tablet Take 1 tablet (50 mg total) by mouth daily. 30 tablet 5   TRULICITY 0.75 MG/0.5ML SOPN INJECT 0.75 MG SUBCUTANEOUSLY ONE TIME PER WEEK 6 mL 1   No current facility-administered medications for this visit.    Allergies  Allergen Reactions   Olmesartan Medoxomil Other (See Comments)    dizziness     REVIEW OF SYSTEMS:   [X]   denotes positive finding, [ ]  denotes negative finding Cardiac  Comments:  Chest pain or chest pressure:    Shortness of breath upon exertion:    Short of breath when lying flat:    Irregular heart rhythm:        Vascular    Pain in calf, thigh, or hip brought on by ambulation:    Pain in feet at night that wakes you up from your sleep:     Blood clot in your veins:    Leg swelling:         Pulmonary    Oxygen at home:    Productive cough:     Wheezing:         Neurologic    Sudden weakness in arms or legs:     Sudden numbness in arms or legs:     Sudden onset of difficulty speaking or slurred speech:    Temporary loss of vision in one eye:     Problems with dizziness:         Gastrointestinal    Blood in stool:     Vomited blood:         Genitourinary    Burning when urinating:     Blood in urine:        Psychiatric    Major depression:         Hematologic    Bleeding problems:    Problems with blood clotting too easily:        Skin    Rashes or ulcers:        Constitutional    Fever or chills:      PHYSICAL EXAMINATION:  There were no vitals filed for this visit.   General:  WDWN in NAD; vital signs documented above Gait: Not observed HENT: WNL, normocephalic Pulmonary: normal non-labored breathing , without Rales, rhonchi,  wheezing Cardiac: regular HR Abdomen: soft, NT, no masses Skin: without rashes Vascular Exam/Pulses:  Right Left  Radial 2+  (normal) 2+ (normal)  Ulnar 2+ (normal) 2+ (normal)  Femoral 2+ (normal) 2+ (normal)      DP 2+ 2+  PT 2+ (normal) 2+   Extremities: without ischemic changes, without Gangrene , without cellulitis; without open wounds;  Musculoskeletal: no muscle wasting or atrophy  Neurologic: A&O X 3;  No focal weakness or paresthesias are detected Psychiatric:  The pt has Normal affect.   Non-Invasive Vascular Imaging:     +-------+-----------+-----------+------------+------------+  ABI/TBIToday's ABIToday's TBIPrevious ABIPrevious TBI  +-------+-----------+-----------+------------+------------+  Right  0.95       0.63       0.99        0.79          +-------+-----------+-----------+------------+------------+  Left   1.07       0.61       0.48        0.25          +-------+-----------+-----------+------------+------------+      ASSESSMENT/PLAN: SACRAMENTO VESCOVI is a 57 y.o. male presenting in follow up with peripheral arterial disease affecting multiple vascular beds.   At the time of his last visit, the patient was having recurrent TIAs with left-sided symptoms, right-sided ICA occlusion, and concern for stump syndrome.  I sent him to both neurology and interventional neurology in an effort to define small vessel disease versus stump syndrome.  Jason [name] believes these events to be small vessel disease which would be best treated with medication optimization and smoking cessation.  Cerebral angiogram  demonstrated ECA to ICA collaterals.  Fortunately, with medication optimization, Jason [name] has not had any further symptoms.  Should he have any TIAs while medically optimized, plan to revisit ICA ligation for stump syndrome with Jason [name].  Regarding his lower extremities, he denies symptoms of claudication, ischemic rest pain, tissue loss. ABIs unchanged Right lower extremity duplex ultrasound reveals a small amount of inflow stenosis in the right common femoral artery prior to the  femoral to below-knee popliteal artery bypass. Left lower extremity EIA stent widely patent with a small amount of stenosis, SFA stent patent with some distal disease in the native SFA.  I had long conversation with Jason [name] regarding the above.  He was very appreciative of the time that is going into defining a possible etiology for the recurrent TIAs.  We discussed the importance of smoking cessation, and medication science.  He plans to wean cigarettes, as well as take his Plavix. My plan to see Jason [name] in 3 months time with repeat imaging studies.  I asked him to call should he have another TIA versus stroke, or if new onset claudication occurs.    Recommend the following which can slow the progression of atherosclerosis and reduce the risk of major adverse cardiac / limb events:  Aspirin 81mg  PO QD.  Atorvastatin 40-80mg  PO QD (or other "high intensity" statin therapy). Complete cessation from all tobacco products. Blood glucose control with goal A1c < 7%. Blood pressure control with goal blood pressure < 140/90 mmHg. Lipid reduction therapy with goal LDL-C <100 mg/dL (<09 if symptomatic from PAD).    Victorino Sparrow, MD [facility] [phone]

## 2023-06-22 ENCOUNTER — Ambulatory Visit (HOSPITAL_COMMUNITY): Payer: Managed Care, Other (non HMO)

## 2023-06-22 ENCOUNTER — Ambulatory Visit: Payer: Managed Care, Other (non HMO) | Admitting: Vascular Surgery

## 2023-06-30 ENCOUNTER — Encounter (HOSPITAL_COMMUNITY): Payer: Managed Care, Other (non HMO)

## 2023-06-30 ENCOUNTER — Ambulatory Visit: Payer: Managed Care, Other (non HMO) | Admitting: Vascular Surgery

## 2023-06-30 NOTE — Telephone Encounter (Signed)
Closing encounter

## 2023-07-26 ENCOUNTER — Other Ambulatory Visit: Payer: Self-pay | Admitting: Family Medicine

## 2023-07-29 ENCOUNTER — Other Ambulatory Visit: Payer: Self-pay | Admitting: Family Medicine

## 2023-08-07 ENCOUNTER — Ambulatory Visit: Payer: Managed Care, Other (non HMO) | Admitting: Neurology

## 2023-08-17 ENCOUNTER — Ambulatory Visit: Payer: Managed Care, Other (non HMO) | Admitting: Vascular Surgery

## 2023-08-17 ENCOUNTER — Ambulatory Visit (HOSPITAL_COMMUNITY)
Admission: RE | Admit: 2023-08-17 | Discharge: 2023-08-17 | Disposition: A | Discharge status: Home / Self Care | Payer: Managed Care, Other (non HMO) | Source: Ambulatory Visit | Attending: Vascular Surgery | Admitting: Vascular Surgery

## 2023-08-17 ENCOUNTER — Ambulatory Visit (INDEPENDENT_AMBULATORY_CARE_PROVIDER_SITE_OTHER)
Admission: RE | Admit: 2023-08-17 | Discharge: 2023-08-17 | Disposition: A | Discharge status: Home / Self Care | Payer: Managed Care, Other (non HMO) | Source: Ambulatory Visit | Attending: Vascular Surgery | Admitting: Vascular Surgery

## 2023-08-17 DIAGNOSIS — I739 Peripheral vascular disease, unspecified: Secondary | ICD-10-CM | POA: Diagnosis present

## 2023-08-17 DIAGNOSIS — I70222 Atherosclerosis of native arteries of extremities with rest pain, left leg: Secondary | ICD-10-CM | POA: Diagnosis present

## 2023-08-17 DIAGNOSIS — Z95820 Peripheral vascular angioplasty status with implants and grafts: Secondary | ICD-10-CM

## 2023-08-17 LAB — VAS US ABI WITH/WO TBI
Left ABI: 1.04
Right ABI: 0.98

## 2023-08-28 ENCOUNTER — Other Ambulatory Visit: Payer: Self-pay | Admitting: Family Medicine

## 2023-08-28 DIAGNOSIS — E7801 Familial hypercholesterolemia: Secondary | ICD-10-CM

## 2023-08-28 DIAGNOSIS — E785 Hyperlipidemia, unspecified: Secondary | ICD-10-CM

## 2023-08-30 NOTE — Progress Notes (Signed)
 Office Note     HPI: Jason [name] is a 57 y.o. (1966/07/22) male presenting in follow up   Prior history includes 07/21/21 LLE SFA/Pop stenting for lifestyle-limiting claudication. Pt also has a history of 11/2017 right femoral to above-knee popliteal bypass with vein, left external iliac artery stent with Dr. Harvey.   2023 TIAs with left-sided deficits that resolved.  This was believed to be small vessel disease, however there was some concern for some syndrome, we stated if they continue to happen on best medical management, we would move forward with ICA ligation. (Left ICA occluded) He immigrated to the [place]  from [place] 1999 and works as an personnel officer. Currently smoking 2ppd. Complaint with ASA/plavix / lipitor.  Exam today, he was doing well.  No complaints.  Currently working on a multistory job where he will climb up to 30 flights of stairs daily.  He states this causes some cramping in his thighs and calfs later in the workday.  Otherwise denies symptoms of claudication, ischemic rest pain, tissue loss.  The pt is  on a statin for cholesterol management.  The pt is not on a daily aspirin .   Other AC: Plavix  The pt is  on medication for hypertension.   The pt is diabetic.  Tobacco hx:  2ppd  Past Medical History:  Diagnosis Date   Anxiety    Depression    Diabetes mellitus without complication (HCC)    GERD (gastroesophageal reflux disease)    Headache(784.0)    Hyperlipidemia    Hypertension    MRSA infection    Left shin, back   Nerve damage    BLE   Neuromuscular disorder (HCC)    Osteoarthritis    Peripheral vascular disease (HCC)    Pneumonia     Past Surgical History:  Procedure Laterality Date   ABDOMINAL AORTOGRAM W/LOWER EXTREMITY N/A 12/01/2017   Procedure: ABDOMINAL AORTOGRAM W/LOWER EXTREMITY;  Surgeon: Harvey Carlin BRAVO, MD;  Location: [facility] INVASIVE CV [facility];  Service: Cardiovascular;  Laterality: N/A;   ABDOMINAL AORTOGRAM W/LOWER EXTREMITY  Bilateral 07/21/2021   Procedure: ABDOMINAL AORTOGRAM W/LOWER EXTREMITY;  Surgeon: Lanis Fonda BRAVO, MD;  Location: [facility] INVASIVE CV LAB;  Service: Cardiovascular;  Laterality: Bilateral;   FEMORAL-POPLITEAL BYPASS GRAFT Right 12/11/2017   Procedure: BYPASS GRAFT RIGHT FEMORAL-ABOVE KNEE POPLITEAL ARTERY with Non Reversed Greater Saphenous Vein and Angioplasty distal vein Graft.;  Surgeon: Harvey Carlin BRAVO, MD;  Location: [facility] OR;  Service: Vascular;  Laterality: Right;   IR ANGIO EXTERNAL CAROTID SEL EXT CAROTID UNI L MOD SED  01/12/2023   IR ANGIO INTRA EXTRACRAN SEL COM CAROTID INNOMINATE UNI R MOD SED  01/12/2023   IR ANGIO INTRA EXTRACRAN SEL INTERNAL CAROTID UNI L MOD SED  01/12/2023   IR ANGIO VERTEBRAL SEL VERTEBRAL BILAT MOD SED  01/12/2023   IR US  GUIDE VASC ACCESS RIGHT  01/12/2023   PERIPHERAL VASCULAR BALLOON ANGIOPLASTY Right 12/01/2017   Procedure: PERIPHERAL VASCULAR BALLOON ANGIOPLASTY;  Surgeon: Harvey Carlin BRAVO, MD;  Location: [facility];  Service: Cardiovascular;  Laterality: Right;  superficial femoral, attempted   PERIPHERAL VASCULAR INTERVENTION Left 12/01/2017   Procedure: PERIPHERAL VASCULAR INTERVENTION;  Surgeon: Harvey Carlin BRAVO, MD;  Location: [facility];  Service: Cardiovascular;  Laterality: Left;  External iliac   PERIPHERAL VASCULAR INTERVENTION Left 07/21/2021   Procedure: PERIPHERAL VASCULAR INTERVENTION;  Surgeon: Lanis Fonda BRAVO, MD;  Location[facility] INVASIVE CV LAB;  Service: Cardiovascular;  Laterality: Left;   ROTATOR CUFF REPAIR  TONSILLECTOMY     VEIN HARVEST Right 12/11/2017   Procedure: RIGHT GREATER SAPHENOUS VEIN HARVEST;  Surgeon: Harvey Carlin BRAVO, MD;  Location: [facility] OR;  Service: Vascular;  Laterality: Right;    Social History   Socioeconomic History   Marital status: Married    Spouse name: Not on file   Number of children: Not on file   Years of education: Not on file   Highest education level: Not on file  Occupational History    Not on file  Tobacco Use   Smoking status: Every Day    Current packs/day: 2.00    Average packs/day: 2.0 packs/day for 25.0 years (50.0 ttl pk-yrs)    Types: Cigarettes   Smokeless tobacco: Never   Tobacco comments:    30 cigarettes per day  Vaping Use   Vaping status: Never Used  Substance and Sexual Activity   Alcohol use: Not Currently    Alcohol/week: 20.0 standard drinks of alcohol    Types: 20 Glasses of wine per week    Comment: not since Jan 2018   Drug use: No   Sexual activity: Not Currently  Other Topics Concern   Not on file  Social History Narrative   Not on file   Social Drivers of Health   Financial Resource Strain: Not on file  Food Insecurity: Not on file  Transportation Needs: Not on file  Physical Activity: Not on file  Stress: Not on file  Social Connections: Not on file  Intimate Partner Violence: Not on file    Family History  Problem Relation Age of Onset   Heart attack Father 55   Hypertension Father    Arthritis Mother    Asthma Maternal Grandfather    Stroke Paternal Grandfather    Heart disease Other    Mental illness Other    Cancer Neg Hx    Diabetes Neg Hx    Drug abuse Neg Hx    Early death Neg Hx    Hearing loss Neg Hx    Hyperlipidemia Neg Hx     Current Outpatient Medications  Medication Sig Dispense Refill   aspirin  EC 81 MG tablet Take 1 tablet (81 mg total) by mouth daily. Swallow whole. 30 tablet 12   atorvastatin  (LIPITOR) 40 MG tablet TAKE 1 TABLET (40 MG TOTAL) BY MOUTH DAILY. *LABS REQUIRED FOR REFILLS 90 tablet 2   clopidogrel  (PLAVIX ) 75 MG tablet Take 1 tablet (75 mg total) by mouth daily. 90 tablet 3   CONTOUR NEXT TEST test strip TEST GLUCOSE ONCE DAILY. DX E 11.9 100 strip 3   diclofenac  (VOLTAREN ) 75 MG EC tablet TAKE 1 TABLET BY MOUTH TWICE A DAY 60 tablet 1   Dulaglutide  (TRULICITY ) 0.75 MG/0.5ML SOAJ INJECT 0.75 MG SUBCUTANEOUSLY ONE TIME PER WEEK 2 mL 1   ezetimibe  (ZETIA ) 10 MG tablet TAKE 1 TABLET BY  MOUTH EVERY DAY 90 tablet 0   gabapentin  (NEURONTIN ) 300 MG capsule TAKE ONE CAPSULE BY MOUTH EVERY MORNING AND TAKE 2 CAPSULES BY MOUTH AT BEDTIME 270 capsule 0   Lancets MISC Contour Lancets.  Test glucose once daily. DxE11.9 100 each 3   metFORMIN  (GLUCOPHAGE ) 500 MG tablet TAKE 2 TABLETS BY MOUTH TWICE A DAY 360 tablet 0   sertraline  (ZOLOFT ) 50 MG tablet TAKE 1 TABLET BY MOUTH EVERY DAY 90 tablet 1   No current facility-administered medications for this visit.    Allergies  Allergen Reactions   Olmesartan Medoxomil Other (See Comments)  dizziness     REVIEW OF SYSTEMS:   [X]  denotes positive finding, [ ]  denotes negative finding Cardiac  Comments:  Chest pain or chest pressure:    Shortness of breath upon exertion:    Short of breath when lying flat:    Irregular heart rhythm:        Vascular    Pain in calf, thigh, or hip brought on by ambulation:    Pain in feet at night that wakes you up from your sleep:     Blood clot in your veins:    Leg swelling:         Pulmonary    Oxygen at home:    Productive cough:     Wheezing:         Neurologic    Sudden weakness in arms or legs:     Sudden numbness in arms or legs:     Sudden onset of difficulty speaking or slurred speech:    Temporary loss of vision in one eye:     Problems with dizziness:         Gastrointestinal    Blood in stool:     Vomited blood:         Genitourinary    Burning when urinating:     Blood in urine:        Psychiatric    Major depression:         Hematologic    Bleeding problems:    Problems with blood clotting too easily:        Skin    Rashes or ulcers:        Constitutional    Fever or chills:      PHYSICAL EXAMINATION:  There were no vitals filed for this visit.   General:  WDWN in NAD; vital signs documented above Gait: Not observed HENT: WNL, normocephalic Pulmonary: normal non-labored breathing , without Rales, rhonchi,  wheezing Cardiac: regular HR Abdomen:  soft, NT, no masses Skin: without rashes Vascular Exam/Pulses:  Right Left  Radial 2+ (normal) 2+ (normal)  Ulnar 2+ (normal) 2+ (normal)  Femoral        DP 2+ 2+  PT     Extremities: without ischemic changes, without Gangrene , without cellulitis; without open wounds;  Musculoskeletal: no muscle wasting or atrophy  Neurologic: A&O X 3;  No focal weakness or paresthesias are detected Psychiatric:  The pt has Normal affect.   Non-Invasive Vascular Imaging:     +-------+-----------+-----------+------------+------------+  ABI/TBIToday's ABIToday's TBIPrevious ABIPrevious TBI  +-------+-----------+-----------+------------+------------+  Right 0.98       0.67       0.99        1.12          +-------+-----------+-----------+------------+------------+  Left  1.04       0.65       1.06        1.05          +-------+-----------+-----------+------------+------------+   Right Graft #1: fem-AK pop  +------------------+--------+--------+----------+--------+                   PSV cm/sStenosisWaveform  Comments  +------------------+--------+--------+----------+--------+  Inflow           180             biphasic            +------------------+--------+--------+----------+--------+  Prox Anastomosis  157             monophasic          +------------------+--------+--------+----------+--------+  Proximal Graft    144             biphasic            +------------------+--------+--------+----------+--------+  Mid Graft         99              biphasic            +------------------+--------+--------+----------+--------+  Distal Graft      92              triphasic           +------------------+--------+--------+----------+--------+  Distal Anastomosis112             triphasic           +------------------+--------+--------+----------+--------+  Outflow          94              biphasic             +------------------+--------+--------+----------+--------+        +----------+--------+-----+--------+---------+-------------+  LEFT     PSV cm/sRatioStenosisWaveform Comments       +----------+--------+-----+--------+---------+-------------+  CFA Distal245                  triphasic               +----------+--------+-----+--------+---------+-------------+  DFA      158                  biphasic                +----------+--------+-----+--------+---------+-------------+  SFA Prox                                stent          +----------+--------+-----+--------+---------+-------------+  SFA Mid                                 stent          +----------+--------+-----+--------+---------+-------------+  SFA Distal191                  triphasicstent outflow  +----------+--------+-----+--------+---------+-------------+  POP Prox  85                   triphasic               +----------+--------+-----+--------+---------+-------------+  POP Distal70                   biphasic                +----------+--------+-----+--------+---------+-------------+     Left Stent(s):  +------------------------+--------+--------+---------+--------+  origin SFA to distal SFAPSV cm/sStenosisWaveform Comments  +------------------------+--------+--------+---------+--------+  Prox to Stent           173             triphasic          +------------------------+--------+--------+---------+--------+  Proximal Stent          166             biphasic           +------------------------+--------+--------+---------+--------+  Mid Stent               92              biphasic           +------------------------+--------+--------+---------+--------+  Distal Stent            84              triphasic          +------------------------+--------+--------+---------+--------+  Distal to Stent         191             triphasic           +------------------------+--------+--------+---------+--------+   Left Stent(s):  +---------------+--------+--------+---------+--------+  EIA           PSV cm/sStenosisWaveform Comments  +---------------+--------+--------+---------+--------+  Prox to Stent  140             biphasic           +---------------+--------+--------+---------+--------+  Proximal Stent 234             triphasic          +---------------+--------+--------+---------+--------+  Mid Stent      202             biphasic           +---------------+--------+--------+---------+--------+  Distal Stent   203             biphasic           +---------------+--------+--------+---------+--------+  Distal to Stent222             biphasic           +---------------+--------+--------+---------+--------+    ASSESSMENT/PLAN: Jason [name] is a 58 y.o. male presenting in follow up with peripheral arterial disease affecting multiple vascular beds.  From a cerebrovascular standpoint, he has had no further TIAs.  Will insonate his right carotid artery at his next visit to ensure no interval stenosis. Right-sided femoropopliteal bypass widely patent, no elevated velocities or concerns for stenosis Left sided EIA stent, left-sided SFA stent widely patent with no flow-limiting stenosis.  We discussed the importance of weaning cigarettes.  He is aware that if he continues at 2 packs/day, the likelihood of future intervention increases dramatically. I asked that he continue his current medication regimen.  My plan is to see him in 6 months time. There was some concern that MRI is noncompatible with his iliac stents - should be MRI conditional per website.     Fonda FORBES Rim, MD [facility] [phone]

## 2023-08-31 ENCOUNTER — Encounter: Payer: Self-pay | Admitting: Vascular Surgery

## 2023-08-31 ENCOUNTER — Ambulatory Visit: Payer: Managed Care, Other (non HMO) | Admitting: Vascular Surgery

## 2023-08-31 VITALS — BP 145/78 | HR 73 | Temp 98.6°F | Resp 20 | Ht 70.0 in | Wt 215.0 lb

## 2023-08-31 DIAGNOSIS — I6522 Occlusion and stenosis of left carotid artery: Secondary | ICD-10-CM | POA: Diagnosis not present

## 2023-08-31 DIAGNOSIS — I739 Peripheral vascular disease, unspecified: Secondary | ICD-10-CM | POA: Diagnosis not present

## 2023-08-31 DIAGNOSIS — Z95820 Peripheral vascular angioplasty status with implants and grafts: Secondary | ICD-10-CM

## 2023-08-31 DIAGNOSIS — Z95828 Presence of other vascular implants and grafts: Secondary | ICD-10-CM | POA: Diagnosis not present

## 2023-09-12 ENCOUNTER — Other Ambulatory Visit: Payer: Self-pay

## 2023-09-12 DIAGNOSIS — I739 Peripheral vascular disease, unspecified: Secondary | ICD-10-CM

## 2023-09-12 DIAGNOSIS — I6522 Occlusion and stenosis of left carotid artery: Secondary | ICD-10-CM

## 2023-11-29 ENCOUNTER — Ambulatory Visit: Admitting: Family Medicine

## 2024-01-06 ENCOUNTER — Other Ambulatory Visit: Payer: Self-pay | Admitting: Vascular Surgery

## 2024-01-23 ENCOUNTER — Other Ambulatory Visit: Payer: Self-pay | Admitting: Family Medicine

## 2024-01-23 ENCOUNTER — Other Ambulatory Visit: Payer: Self-pay | Admitting: Vascular Surgery

## 2024-01-31 NOTE — Telephone Encounter (Signed)
 Refill request from [facility] for Plavix  was received. Called pharmacy, as a 90 day supply was placed last month. They confirmed this was picked up 2 weeks ago. I reached out to pt and spoke to his wife who stated this was not needed at this time, as he just started using it this week and it is a 90 day supply. No further questions/concerns.

## 2024-02-02 ENCOUNTER — Ambulatory Visit: Admitting: Family Medicine

## 2024-02-07 ENCOUNTER — Encounter: Payer: Self-pay | Admitting: Family Medicine

## 2024-02-07 ENCOUNTER — Ambulatory Visit: Admitting: Family Medicine

## 2024-02-07 VITALS — BP 170/60 | HR 84 | Temp 97.6°F | Wt 220.0 lb

## 2024-02-07 DIAGNOSIS — L570 Actinic keratosis: Secondary | ICD-10-CM

## 2024-02-07 DIAGNOSIS — E7801 Familial hypercholesterolemia: Secondary | ICD-10-CM

## 2024-02-07 DIAGNOSIS — R0600 Dyspnea, unspecified: Secondary | ICD-10-CM | POA: Diagnosis not present

## 2024-02-07 DIAGNOSIS — I10 Essential (primary) hypertension: Secondary | ICD-10-CM

## 2024-02-07 MED ORDER — LISINOPRIL 10 MG PO TABS: 10.0000 mg | ORAL_TABLET | Freq: Every day | ORAL | 3 refills | Status: DC

## 2024-02-07 NOTE — Progress Notes (Signed)
 Established Patient Office Visit  Subjective   Patient ID: Jason [name], male    DOB: October 03, 1965  Age: 58 y.o. MRN: [id number]  Chief Complaint  Patient presents with   Shortness of Breath   Skin Problem    HPI   Jason [name] is seen today for the following items  Thickened scaly areas left forearm.  Frequently pruritic.  History of increased sun exposure throughout life.  Elevated blood pressure.  He has past history of alcohol abuse.  Recently has resumed drinking about 3 beers per day which could be exacerbating blood pressure.  Does not currently take any blood pressure medications.  Previously had some dizziness with Benicar.  He had some recent shortness of breath.  No chest pains.  Long history of smoking.  No cough.  No fevers or chills.  Weight is up about 14 pounds from last visit.  No peripheral edema.  No orthopnea.  Has multiple chronic problems including hypertension, peripheral artery disease, type 2 diabetes, obstructive sleep apnea, GERD, osteoarthritis involving multiple joints, hyperlipidemia, history of alcohol abuse.  Poor compliance with follow-up.  Past Medical History:  Diagnosis Date   Anxiety    Depression    Diabetes mellitus without complication (HCC)    GERD (gastroesophageal reflux disease)    Headache(784.0)    Hyperlipidemia    Hypertension    MRSA infection    Left shin, back   Nerve damage    BLE   Neuromuscular disorder (HCC)    Osteoarthritis    Peripheral vascular disease (HCC)    Pneumonia    Past Surgical History:  Procedure Laterality Date   ABDOMINAL AORTOGRAM W/LOWER EXTREMITY N/A 12/01/2017   Procedure: ABDOMINAL AORTOGRAM W/LOWER EXTREMITY;  Surgeon: Richrd Char, MD;  Location: [facility] INVASIVE CV LAB;  Service: Cardiovascular;  Laterality: N/A;   ABDOMINAL AORTOGRAM W/LOWER EXTREMITY Bilateral 07/21/2021   Procedure: ABDOMINAL AORTOGRAM W/LOWER EXTREMITY;  Surgeon: Kayla Part, MD;  Location: [facility] INVASIVE CV LAB;   Service: Cardiovascular;  Laterality: Bilateral;   FEMORAL-POPLITEAL BYPASS GRAFT Right 12/11/2017   Procedure: BYPASS GRAFT RIGHT FEMORAL-ABOVE KNEE POPLITEAL ARTERY with Non Reversed Greater Saphenous Vein and Angioplasty distal vein Graft.;  Surgeon: Richrd Char, MD;  Location: [facility] OR;  Service: Vascular;  Laterality: Right;   IR ANGIO EXTERNAL CAROTID SEL EXT CAROTID UNI L MOD SED  01/12/2023   IR ANGIO INTRA EXTRACRAN SEL COM CAROTID INNOMINATE UNI R MOD SED  01/12/2023   IR ANGIO INTRA EXTRACRAN SEL INTERNAL CAROTID UNI L MOD SED  01/12/2023   IR ANGIO VERTEBRAL SEL VERTEBRAL BILAT MOD SED  01/12/2023   IR US  GUIDE VASC ACCESS RIGHT  01/12/2023   PERIPHERAL VASCULAR BALLOON ANGIOPLASTY Right 12/01/2017   Procedure: PERIPHERAL VASCULAR BALLOON ANGIOPLASTY;  Surgeon: Richrd Char, MD;  Location: [facility] INVAS[facility];  Service: Cardiovascular;  Laterality: Right;  superficial femoral, attempted   PERIPHERAL VASCULAR INTERVENTION Left 12/01/2017   Procedure: PERIPHERAL VASCULAR INTERVENTION;  Surgeon: Richrd Char, MD;  Location: [facility]AS[facility];  Service: Cardiovascular;  Laterality: Left;  External iliac   PERIPHERAL VASCULAR INTERVENTION Left 07/21/2021   Procedure: PERIPHERAL VASCULAR INTERVENTION;  Surgeon: Kayla Part, MD;  Location: [facility] INVASIVE CV LAB;  Service: Cardiovascular;  Laterality: Left;   ROTATOR CUFF REPAIR     TONSILLECTOMY     VEIN HARVEST Right 12/11/2017   Procedure: RIGHT GREATER SAPHENOUS VEIN HARVEST;  Surgeon: Richrd Char, MD;  Location: [facility] OR;  Service:  Vascular;  Laterality: Right;    reports that he has been smoking cigarettes. He has a 50 pack-year smoking history. He has never used smokeless tobacco. He reports that he does not currently use alcohol after a past usage of about 20.0 standard drinks of alcohol per week. He reports that he does not use drugs. family history includes Arthritis in his mother; Asthma in his maternal grandfather; Heart  attack (age of onset: 23) in his father; Heart disease in an other family member; Hypertension in his father; Mental illness in an other family member; Stroke in his paternal grandfather. Allergies  Allergen Reactions   Olmesartan Medoxomil Other (See Comments)    dizziness    Review of Systems  Constitutional:  Negative for chills and fever.  Eyes:  Negative for blurred vision.  Respiratory:  Positive for shortness of breath. Negative for cough.   Cardiovascular:  Negative for chest pain, palpitations, orthopnea and leg swelling.  Neurological:  Negative for dizziness, weakness and headaches.      Objective:     BP (!) 170/60 (BP Location: Left Arm, Patient Position: Sitting, Cuff Size: Normal)   Pulse 84   Temp 97.6 F (36.4 C) (Oral)   Wt 220 lb (99.8 kg)   SpO2 95%   BMI 31.57 kg/m  BP Readings from Last 3 Encounters:  02/07/24 (!) 170/60  08/31/23 (!) 145/78  05/22/23 (!) 154/63   Wt Readings from Last 3 Encounters:  02/07/24 220 lb (99.8 kg)  08/31/23 215 lb (97.5 kg)  04/12/23 206 lb 9.6 oz (93.7 kg)      Physical Exam Vitals reviewed.  Constitutional:      Appearance: He is well-developed.  HENT:     Right Ear: External ear normal.     Left Ear: External ear normal.  Eyes:     Pupils: Pupils are equal, round, and reactive to light.  Neck:     Thyroid : No thyromegaly.  Cardiovascular:     Rate and Rhythm: Normal rate and regular rhythm.  Pulmonary:     Effort: Pulmonary effort is normal. No respiratory distress.     Breath sounds: Normal breath sounds. No wheezing or rales.  Musculoskeletal:     Cervical back: Neck supple.  Skin:    Comments: He has a few scattered raised hyperkeratotic whitish lesions left forearm.  No ulcerations.  Neurological:     Mental Status: He is alert and oriented to person, place, and time.      No results found for any visits on 02/07/24.    The 10-year ASCVD risk score (Arnett DK, et al., 2019) is: 41.8%     Assessment & Plan:   #1 multiple actinic keratoses left forearm.  We discussed the fact these are related to years of sun exposure.  Recommend treatment with liquid nitrogen.  He will reschedule to get these treated.  #2 hypertension.  Currently untreated.  Start lisinopril 10 mg daily.  Set up follow-up in about 4 weeks to reassess.  Try to scale back or eliminate alcohol intake completely.  Check BMP at follow up.   #3 hyperlipidemia.  Patient is on statin therapy.  Needs follow-up labs.  Check lipid and CMP at follow-up  #4 type 2 diabetes.  Needs follow-up labs.  Address at follow-up in 1 month   Glean Lamy, MD

## 2024-02-07 NOTE — Patient Instructions (Signed)
 SET UP COMPLETE PHYSICAL SOON.

## 2024-02-15 ENCOUNTER — Other Ambulatory Visit: Payer: Self-pay | Admitting: Family Medicine

## 2024-02-17 ENCOUNTER — Other Ambulatory Visit: Payer: Self-pay | Admitting: Family Medicine

## 2024-02-29 ENCOUNTER — Encounter (HOSPITAL_COMMUNITY): Payer: Managed Care, Other (non HMO)

## 2024-02-29 ENCOUNTER — Ambulatory Visit: Payer: Managed Care, Other (non HMO)

## 2024-03-08 ENCOUNTER — Ambulatory Visit: Admitting: Family Medicine

## 2024-03-08 ENCOUNTER — Encounter: Payer: Self-pay | Admitting: Family Medicine

## 2024-03-08 ENCOUNTER — Ambulatory Visit (INDEPENDENT_AMBULATORY_CARE_PROVIDER_SITE_OTHER): Admitting: Family Medicine

## 2024-03-08 VITALS — BP 156/70 | HR 73 | Temp 98.0°F | Ht 70.08 in | Wt 220.1 lb

## 2024-03-08 DIAGNOSIS — Z0000 Encounter for general adult medical examination without abnormal findings: Secondary | ICD-10-CM

## 2024-03-08 DIAGNOSIS — I10 Essential (primary) hypertension: Secondary | ICD-10-CM | POA: Diagnosis not present

## 2024-03-08 DIAGNOSIS — Z23 Encounter for immunization: Secondary | ICD-10-CM | POA: Diagnosis not present

## 2024-03-08 DIAGNOSIS — E1165 Type 2 diabetes mellitus with hyperglycemia: Secondary | ICD-10-CM

## 2024-03-08 DIAGNOSIS — Z122 Encounter for screening for malignant neoplasm of respiratory organs: Secondary | ICD-10-CM

## 2024-03-08 DIAGNOSIS — E7801 Familial hypercholesterolemia: Secondary | ICD-10-CM | POA: Diagnosis not present

## 2024-03-08 DIAGNOSIS — Z125 Encounter for screening for malignant neoplasm of prostate: Secondary | ICD-10-CM | POA: Diagnosis not present

## 2024-03-08 DIAGNOSIS — E1159 Type 2 diabetes mellitus with other circulatory complications: Secondary | ICD-10-CM

## 2024-03-08 DIAGNOSIS — Z1211 Encounter for screening for malignant neoplasm of colon: Secondary | ICD-10-CM

## 2024-03-08 LAB — CBC WITH DIFFERENTIAL/PLATELET
Basophils Absolute: 0.1 K/uL (ref 0.0–0.1)
Basophils Relative: 0.9 % (ref 0.0–3.0)
Eosinophils Absolute: 0.5 K/uL (ref 0.0–0.7)
Eosinophils Relative: 4 % (ref 0.0–5.0)
HCT: 46 % (ref 39.0–52.0)
Hemoglobin: 15.5 g/dL (ref 13.0–17.0)
Lymphocytes Relative: 18.4 % (ref 12.0–46.0)
Lymphs Abs: 2.1 K/uL (ref 0.7–4.0)
MCHC: 33.8 g/dL (ref 30.0–36.0)
MCV: 90.4 fl (ref 78.0–100.0)
Monocytes Absolute: 1 K/uL (ref 0.1–1.0)
Monocytes Relative: 8.4 % (ref 3.0–12.0)
Neutro Abs: 7.8 K/uL — ABNORMAL HIGH (ref 1.4–7.7)
Neutrophils Relative %: 68.3 % (ref 43.0–77.0)
Platelets: 349 K/uL (ref 150.0–400.0)
RBC: 5.09 Mil/uL (ref 4.22–5.81)
RDW: 13.7 % (ref 11.5–15.5)
WBC: 11.4 K/uL — ABNORMAL HIGH (ref 4.0–10.5)

## 2024-03-08 LAB — HEPATIC FUNCTION PANEL
ALT: 45 U/L (ref 0–53)
AST: 27 U/L (ref 0–37)
Albumin: 4.8 g/dL (ref 3.5–5.2)
Alkaline Phosphatase: 97 U/L (ref 39–117)
Bilirubin, Direct: 0.1 mg/dL (ref 0.0–0.3)
Total Bilirubin: 0.5 mg/dL (ref 0.2–1.2)
Total Protein: 7 g/dL (ref 6.0–8.3)

## 2024-03-08 LAB — PSA: PSA: 1.24 ng/mL (ref 0.10–4.00)

## 2024-03-08 LAB — LIPID PANEL
Cholesterol: 163 mg/dL (ref 0–200)
HDL: 34.3 mg/dL — ABNORMAL LOW (ref >39.00)
LDL Cholesterol: 73 mg/dL (ref 0–99)
NonHDL: 128.82
Total CHOL/HDL Ratio: 5
Triglycerides: 280 mg/dL — ABNORMAL HIGH (ref 0.0–149.0)
VLDL: 56 mg/dL — ABNORMAL HIGH (ref 0.0–40.0)

## 2024-03-08 LAB — MICROALBUMIN / CREATININE URINE RATIO
Creatinine,U: 156.2 mg/dL
Microalb Creat Ratio: 40.8 mg/g — ABNORMAL HIGH (ref 0.0–30.0)
Microalb, Ur: 6.4 mg/dL — ABNORMAL HIGH (ref 0.0–1.9)

## 2024-03-08 LAB — HEMOGLOBIN A1C: Hgb A1c MFr Bld: 8.5 % — ABNORMAL HIGH (ref 4.6–6.5)

## 2024-03-08 LAB — BASIC METABOLIC PANEL WITH GFR
BUN: 11 mg/dL (ref 6–23)
CO2: 26 meq/L (ref 19–32)
Calcium: 9.7 mg/dL (ref 8.4–10.5)
Chloride: 103 meq/L (ref 96–112)
Creatinine, Ser: 0.81 mg/dL (ref 0.40–1.50)
GFR: 97.74 mL/min (ref >60.00)
Glucose, Bld: 138 mg/dL — ABNORMAL HIGH (ref 70–99)
Potassium: 4.4 meq/L (ref 3.5–5.1)
Sodium: 139 meq/L (ref 135–145)

## 2024-03-08 MED ORDER — BUSPIRONE HCL 15 MG PO TABS: ORAL_TABLET | ORAL | 3 refills | Status: DC

## 2024-03-08 NOTE — Progress Notes (Signed)
 Established Patient Office Visit  Subjective   Patient ID: Jason [name], male    DOB: 13-Feb-1966  Age: 58 y.o. MRN: [id number]  Chief Complaint  Patient presents with   Annual Exam    HPI   Jason [name] is seen for physical exam.  Multiple chronic problems including history of hypertension, peripheral artery disease, type 2 diabetes, sleep apnea, GERD, sensory neuropathy, osteoarthritis, history of alcohol abuse.  Poor compliance with medications.  Currently not taking lisinopril  regularly.  He states he had some dizziness but no confirmed associated low blood pressure.  He unfortunately is smoking 3 packs/day.  Low motivation to quit.  He has also resumed drinking about 3 beers per day.  He has very stressful job.  Works as a Merchandiser, retail with [employer], [employer] and he helps manage several Engineer, site projects.  Relates tremendous anxiety and requesting medication for that.  Has been on sertraline  in the past and did not feel this offered any real benefit.  Does take metformin , Zetia , Trulicity , Plavix , atorvastatin   Health maintenance reviewed  Health Maintenance  Topic Date Due   COVID-19 Vaccine (1) Never done   FOOT EXAM  Never done   HIV Screening  Never done   Diabetic kidney evaluation - Urine ACR  Never done   Hepatitis B Vaccines (1 of 3 - 19+ 3-dose series) Never done   Fecal DNA (Cologuard)  Never done   Lung Cancer Screening  Never done   OPHTHALMOLOGY EXAM  08/02/2022   HEMOGLOBIN A1C  04/18/2023   Diabetic kidney evaluation - eGFR measurement  01/12/2024   Zoster Vaccines- Shingrix (1 of 2) 06/08/2024 (Originally 06/25/1985)   DTaP/Tdap/Td (3 - Td or Tdap) 12/05/2031   Pneumococcal Vaccine 17-33 Years old  Completed   HPV VACCINES  Aged Out   Meningococcal B Vaccine  Aged Out   INFLUENZA VACCINE  Discontinued   Hepatitis C Screening  Discontinued   - He declines Shingrix vaccine -Does consent to Prevnar 20 -Needs initial screening colonoscopy and he  consents -Discussed low-dose CT lung cancer screening and he agrees  Family history-'s father was murdered age 45.  Mother has history of depression and some type of heart valve replacement.  He has a sister generally alive and well.  Social history-married.  Has 1 daughter from prior marriage.  Drinks about 3 beers per day.  Smokes 3 pack of cigarettes per day.  Past Medical History:  Diagnosis Date   Anxiety    Depression    Diabetes mellitus without complication (HCC)    GERD (gastroesophageal reflux disease)    Headache(784.0)    Hyperlipidemia    Hypertension    MRSA infection    Left shin, back   Nerve damage    BLE   Neuromuscular disorder (HCC)    Osteoarthritis    Peripheral vascular disease (HCC)    Pneumonia    Past Surgical History:  Procedure Laterality Date   ABDOMINAL AORTOGRAM W/LOWER EXTREMITY N/A 12/01/2017   Procedure: ABDOMINAL AORTOGRAM W/LOWER EXTREMITY;  Surgeon: Harvey Carlin BRAVO, MD;  Location: [facility] IN[facility];  Service: Cardiovascular;  Laterality: N/A;   ABDOMINAL AORTOGRAM W/LOWER EXTREMITY Bilateral 07/21/2021   Procedure: ABDOMINAL AORTOGRAM W/LOWER EXTREMITY;  Surgeon: Lanis Fonda BRAVO, MD;  [facility]VASIVE CV LAB;  Service: Cardiovascular;  Laterality: Bilateral;   FEMORAL-POPLITEAL BYPASS GRAFT Right 12/11/2017   Procedure: BYPASS GRAFT RIGHT FEMORAL-ABOVE KNEE POPLITEAL ARTERY with Non Reversed Greater Saphenous Vein and Angioplasty distal vein Graft.;  Surgeon: Harvey,  Carlin BRAVO, MD;  Location: [facility] OR;  Service: Vascular;  Laterality: Right;   IR ANGIO EXTERNAL CAROTID SEL EXT CAROTID UNI L MOD SED  01/12/2023   IR ANGIO INTRA EXTRACRAN SEL COM CAROTID INNOMINATE UNI R MOD SED  01/12/2023   IR ANGIO INTRA EXTRACRAN SEL INTERNAL CAROTID UNI L MOD SED  01/12/2023   IR ANGIO VERTEBRAL SEL VERTEBRAL BILAT MOD SED  01/12/2023   IR US  GUIDE VASC ACCESS RIGHT  01/12/2023   PERIPHERAL VASCULAR BALLOON ANGIOPLASTY Right 12/01/2017   Procedure: PERIPHERAL  VASCULAR BALLOON ANGIOPLASTY;  Surgeon: Harvey Carlin BRAVO, MD;  Location: [facility];  Service: Cardiovascular;  Laterality: Right;  superficial femoral, attempted   PERIPHERAL VASCULAR INTERVENTION Left 12/01/2017   Procedure: PERIPHERAL VASCULAR INTERVENTION;  Surgeon: Harvey Carlin BRAVO, MD;  Location: [facility];  Service: Cardiovascular;  Laterality: Left;  External iliac   PERIPHERAL VASCULAR INTERVENTION Left 07/21/2021   Procedure: PERIPHERAL VASCULAR INTERVENTION;  Surgeon: Lanis Fonda BRAVO, MD;  Location: [facility] INVASIVE CV LAB;  Service: Cardiovascular;  Laterality: Left;   ROTATOR CUFF REPAIR     TONSILLECTOMY     VEIN HARVEST Right 12/11/2017   Procedure: RIGHT GREATER SAPHENOUS VEIN HARVEST;  Surgeon: Harvey Carlin BRAVO, MD;  Location: [facility] OR;  Service: Vascular;  Laterality: Right;    reports that he has been smoking cigarettes. He has a 50 pack-year smoking history. He has never used smokeless tobacco. He reports that he does not currently use alcohol after a past usage of about 20.0 standard drinks of alcohol per week. He reports that he does not use drugs. family history includes Arthritis in his mother; Asthma in his maternal grandfather; Heart attack (age of onset: 73) in his father; Heart disease in an other family member; Hypertension in his father; Mental illness in an other family member; Stroke in his paternal grandfather. Allergies  Allergen Reactions   Olmesartan Medoxomil Other (See Comments)    dizziness   The 10-year ASCVD risk score (Arnett DK, et al., 2019) is: 35.5%   Values used to calculate the score:     Age: 27 years     Clincally relevant sex: Male     Is Non-Hispanic African American: No     Diabetic: Yes     Tobacco smoker: Yes     Systolic Blood Pressure: 156 mmHg     Is BP treated: Yes     HDL Cholesterol: 34.3 mg/dL     Total Cholesterol: 163 mg/dL  Review of Systems  Constitutional:  Negative for malaise/fatigue.  Eyes:  Negative for  blurred vision.  Respiratory:  Negative for shortness of breath.   Cardiovascular:  Negative for chest pain.  Gastrointestinal:  Negative for abdominal pain.  Genitourinary:  Negative for dysuria.  Musculoskeletal:  Positive for joint pain.  Neurological:  Negative for dizziness, weakness and headaches.  Psychiatric/Behavioral:  Negative for depression and suicidal ideas. The patient is nervous/anxious.       Objective:     BP (!) 156/70 (BP Location: Left Arm, Patient Position: Sitting, Cuff Size: Normal)   Pulse 73   Temp 98 F (36.7 C) (Oral)   Ht 5' 10.08 (1.78 m)   Wt 220 lb 1.6 oz (99.8 kg)   SpO2 95%   BMI 31.51 kg/m  BP Readings from Last 3 Encounters:  03/08/24 (!) 156/70  02/07/24 (!) 170/60  08/31/23 (!) 145/78   Wt Readings from Last 3 Encounters:  03/08/24 220 lb 1.6 oz (  99.8 kg)  02/07/24 220 lb (99.8 kg)  08/31/23 215 lb (97.5 kg)      Physical Exam Vitals reviewed.  Constitutional:      General: He is not in acute distress.    Appearance: He is well-developed. He is not ill-appearing.  HENT:     Right Ear: External ear normal.     Left Ear: External ear normal.  Eyes:     Pupils: Pupils are equal, round, and reactive to light.  Neck:     Thyroid : No thyromegaly.  Cardiovascular:     Rate and Rhythm: Normal rate and regular rhythm.  Pulmonary:     Effort: Pulmonary effort is normal. No respiratory distress.     Breath sounds: Normal breath sounds. No wheezing or rales.  Musculoskeletal:     Cervical back: Neck supple.     Right lower leg: No edema.     Left lower leg: No edema.  Neurological:     Mental Status: He is alert and oriented to person, place, and time.      No results found for any visits on 03/08/24.    The 10-year ASCVD risk score (Arnett DK, et al., 2019) is: 37.1%    Assessment & Plan:   Problem List Items Addressed This Visit       Unprioritized   Poorly controlled type 2 diabetes mellitus with circulatory  disorder (HCC) - Primary   Relevant Orders   Hemoglobin A1c   Microalbumin / creatinine urine ratio   Hyperlipidemia   Essential hypertension, benign   Relevant Orders   Basic metabolic panel with GFR   Other Visit Diagnoses       Physical exam       Relevant Orders   Basic metabolic panel with GFR   Lipid panel   CBC with Differential/Platelet   Hepatic function panel   PSA     Colon cancer screening       Relevant Orders   Ambulatory referral to Gastroenterology     Screening for lung cancer       Relevant Orders   Ambulatory Referral for Lung Cancer Scre     Need for pneumococcal vaccination       Relevant Orders   Pneumococcal conjugate vaccine 20-valent (Prevnar 20) (Completed)     Jaivian is here today for complete physical.  We discussed several health maintenance issues as follows:  -Recommend colon cancer screening.  We discussed colonoscopy versus Cologuard.  He agrees to the former.  Order placed for GI referral  -Consider Shingrix and Prevnar 20 vaccines.  He declines Shingrix but does agree to [employer] 20  -Recommend he consider low-dose CT lung cancer screening and he agrees to setting this up.  Referral placed to get this through pulmonary department  -Consider annual flu vaccine  -Strongly encouraged to stop smoking.  No motivation to quit  -Discussed coronary calcium  scan to further cardiac risk stratify though we know he is fairly high risk with past history of peripheral vascular disease and multiple risk factors including nicotine  use, hypertension, hyperlipidemia and type 2 diabetes  -Strongly encouraged to get back on lisinopril  10 mg daily which she has not been taking recently.  -Scaled back or eliminate completely alcohol   Return today (on 03/08/2024).    Wolm Scarlet, MD

## 2024-03-10 ENCOUNTER — Ambulatory Visit: Payer: Self-pay | Admitting: Family Medicine

## 2024-03-11 ENCOUNTER — Telehealth: Payer: Self-pay

## 2024-03-11 MED ORDER — EMPAGLIFLOZIN 10 MG PO TABS: 10.0000 mg | ORAL_TABLET | Freq: Every day | ORAL | 0 refills | Status: DC

## 2024-03-11 NOTE — Telephone Encounter (Signed)
 Copied from CRM [id number]. Topic: Clinical - Prescription Issue >> Mar 11, 2024 10:41 AM Aleatha BROCKS wrote: Reason for CRM: Patient said the medication Jardain caused a yeast infection last time and would like something else

## 2024-03-12 MED ORDER — PIOGLITAZONE HCL 15 MG PO TABS: 15.0000 mg | ORAL_TABLET | Freq: Every day | ORAL | 0 refills | Status: DC

## 2024-03-12 NOTE — Telephone Encounter (Signed)
 Patient's wife is aware rx was sent

## 2024-03-12 NOTE — Telephone Encounter (Signed)
 Rx sent and ATC patient but could not leave vm at this time

## 2024-03-12 NOTE — Addendum Note (Signed)
 Addended by: METTA KRISTEN CROME on: 03/12/2024 08:47 AM   Modules accepted: Orders

## 2024-03-13 ENCOUNTER — Other Ambulatory Visit: Payer: Self-pay | Admitting: Family Medicine

## 2024-03-14 ENCOUNTER — Encounter: Payer: Self-pay | Admitting: Nurse Practitioner

## 2024-03-17 ENCOUNTER — Other Ambulatory Visit: Payer: Self-pay | Admitting: Family Medicine

## 2024-03-20 ENCOUNTER — Encounter: Payer: Self-pay | Admitting: Family Medicine

## 2024-03-20 ENCOUNTER — Other Ambulatory Visit: Payer: Self-pay

## 2024-03-20 MED ORDER — TRULICITY 0.75 MG/0.5ML ~~LOC~~ SOAJ: SUBCUTANEOUS | 1 refills | Status: DC

## 2024-03-22 ENCOUNTER — Ambulatory Visit (HOSPITAL_BASED_OUTPATIENT_CLINIC_OR_DEPARTMENT_OTHER)
Admission: RE | Admit: 2024-03-22 | Discharge: 2024-03-22 | Disposition: A | Discharge status: Home / Self Care | Payer: Self-pay | Source: Ambulatory Visit | Attending: Family Medicine | Admitting: Family Medicine

## 2024-03-22 DIAGNOSIS — E1159 Type 2 diabetes mellitus with other circulatory complications: Secondary | ICD-10-CM | POA: Insufficient documentation

## 2024-03-22 DIAGNOSIS — E1165 Type 2 diabetes mellitus with hyperglycemia: Secondary | ICD-10-CM | POA: Insufficient documentation

## 2024-03-22 DIAGNOSIS — I10 Essential (primary) hypertension: Secondary | ICD-10-CM | POA: Insufficient documentation

## 2024-03-25 MED ORDER — ATORVASTATIN CALCIUM 80 MG PO TABS: 80.0000 mg | ORAL_TABLET | Freq: Every day | ORAL | 0 refills | Status: DC

## 2024-03-25 NOTE — Addendum Note (Signed)
 Addended by: METTA KRISTEN CROME on: 03/25/2024 09:28 AM   Modules accepted: Orders

## 2024-04-01 ENCOUNTER — Ambulatory Visit (INDEPENDENT_AMBULATORY_CARE_PROVIDER_SITE_OTHER): Payer: Self-pay | Admitting: Family Medicine

## 2024-04-01 ENCOUNTER — Encounter: Payer: Self-pay | Admitting: Family Medicine

## 2024-04-01 VITALS — BP 150/60 | HR 79 | Temp 97.8°F | Wt 222.5 lb

## 2024-04-01 DIAGNOSIS — Z7984 Long term (current) use of oral hypoglycemic drugs: Secondary | ICD-10-CM

## 2024-04-01 DIAGNOSIS — I10 Essential (primary) hypertension: Secondary | ICD-10-CM

## 2024-04-01 DIAGNOSIS — E1165 Type 2 diabetes mellitus with hyperglycemia: Secondary | ICD-10-CM

## 2024-04-01 DIAGNOSIS — R053 Chronic cough: Secondary | ICD-10-CM

## 2024-04-01 DIAGNOSIS — E1159 Type 2 diabetes mellitus with other circulatory complications: Secondary | ICD-10-CM

## 2024-04-01 DIAGNOSIS — R918 Other nonspecific abnormal finding of lung field: Secondary | ICD-10-CM

## 2024-04-01 DIAGNOSIS — Z7985 Long-term (current) use of injectable non-insulin antidiabetic drugs: Secondary | ICD-10-CM

## 2024-04-01 MED ORDER — TELMISARTAN 40 MG PO TABS: 40.0000 mg | ORAL_TABLET | Freq: Every day | ORAL | 3 refills | Status: AC

## 2024-04-01 NOTE — Patient Instructions (Signed)
 STOP the Lisinopril   Start the Telmisartan  40 mg daily.    Let me know if the cough is not better in two weeks.

## 2024-04-01 NOTE — Progress Notes (Signed)
 Established Patient Office Visit  Subjective   Patient ID: Jason [name], male    DOB: June 15, 1966  Age: 58 y.o. MRN: [id number]  Chief Complaint  Patient presents with   Cough    HPI   Jason [name] has history of hypertension, peripheral artery disease, type 2 diabetes, GERD, sleep apnea, osteoarthritis, history of alcohol abuse, history of depression, hyperlipidemia.  Seen today with some cough and scratchy throat symptoms.  He states he is really had more of a tickle type sensation with cough for several weeks if not months.  No hemoptysis.  No fever or chills.  No pleuritic pain.  Had recent cardiac/coronary calcium  score which showed bilateral pulmonary nodules up to 4 mm.  Does have ongoing nicotine  use history and we had suggested 52-month follow-up repeat scan.  There was also mention on that x-ray of hepatic steatosis and emphysema changes.  He does take lisinopril  in addition to other medications and we explained that could be causing his dry cough.  Denies any pleuritic pain.  Reviewed recent coronary calcium  score with score of 227.  This was combination of left main and left anterior descending and left circumflex predominantly.  Patient is on high-dose statin and supposed be taking aspirin  daily  Recent A1c of 8.5%.  He had some proteinuria on urine dipstick and we recommended Jardiance  but he declined taking.  Apparently had intolerance previously with SGLT2 medication.  He does remain on metformin  and Trulicity  as well as Actos .  Past Medical History:  Diagnosis Date   Anxiety    Depression    Diabetes mellitus without complication (HCC)    GERD (gastroesophageal reflux disease)    Headache(784.0)    Hyperlipidemia    Hypertension    MRSA infection    Left shin, back   Nerve damage    BLE   Neuromuscular disorder (HCC)    Osteoarthritis    Peripheral vascular disease (HCC)    Pneumonia    Past Surgical History:  Procedure Laterality Date   ABDOMINAL  AORTOGRAM W/LOWER EXTREMITY N/A 12/01/2017   Procedure: ABDOMINAL AORTOGRAM W/LOWER EXTREMITY;  Surgeon: Harvey Carlin BRAVO, MD;  Location: [facility] INVASIVE CV LAB;  Service: Cardiovascular;  Laterality: N/A;   ABDOMINAL AORTOGRAM W/LOWER EXTREMITY Bilateral 07/21/2021   Procedure: ABDOMINAL AORTOGRAM W/LOWER EXTREMITY;  Surgeon: Lanis Fonda BRAVO, MD;  Location: [facility] INVASIVE CV LAB;  Service: Cardiovascular;  Laterality: Bilateral;   FEMORAL-POPLITEAL BYPASS GRAFT Right 12/11/2017   Procedure: BYPASS GRAFT RIGHT FEMORAL-ABOVE KNEE POPLITEAL ARTERY with Non Reversed Greater Saphenous Vein and Angioplasty distal vein Graft.;  Surgeon: Harvey Carlin BRAVO, MD;  Location: [facility] OR;  Service: Vascular;  Laterality: Right;   IR ANGIO EXTERNAL CAROTID SEL EXT CAROTID UNI L MOD SED  01/12/2023   IR ANGIO INTRA EXTRACRAN SEL COM CAROTID INNOMINATE UNI R MOD SED  01/12/2023   IR ANGIO INTRA EXTRACRAN SEL INTERNAL CAROTID UNI L MOD SED  01/12/2023   IR ANGIO VERTEBRAL SEL VERTEBRAL BILAT MOD SED  01/12/2023   IR US  GUIDE VASC ACCESS RIGHT  01/12/2023   PERIPHERAL VASCULAR BALLOON ANGIOPLASTY Right 12/01/2017   Procedure: PERIPHERAL VASCULAR BALLOON ANGIOPLASTY;  Surgeon: Harvey Carlin BRAVO, MD;  Location: [facility] INVASIVE [facility] LAB;  Service: Cardiovascular;  Laterality: Right;  superficial femoral, attempted   PERIPHERAL VASCULAR INTERVENTION Left 12/01/2017   Procedure: PERIPHERAL VASCULAR INTERVENTION;  Surgeon: Harvey Carlin BRAVO, MD;  Location: [facility] INVAS[facility];  Service: Cardiovascular;  Laterality: Left;  External iliac   PERIPHERAL VASCULAR INTERVENTION  Left 07/21/2021   Procedure: PERIPHERAL VASCULAR INTERVENTION;  Surgeon: Lanis Fonda BRAVO, MD;  Location: [facility] INVASIVE CV LAB;  Service: Cardiovascular;  Laterality: Left;   ROTATOR CUFF REPAIR     TONSILLECTOMY     VEIN HARVEST Right 12/11/2017   Procedure: RIGHT GREATER SAPHENOUS VEIN HARVEST;  Surgeon: Harvey Carlin BRAVO, MD;  Location: [facility] OR;  Service: Vascular;  Laterality:  Right;    reports that he has been smoking cigarettes. He has a 50 pack-year smoking history. He has never used smokeless tobacco. He reports that he does not currently use alcohol after a past usage of about 20.0 standard drinks of alcohol per week. He reports that he does not use drugs. family history includes Arthritis in his mother; Asthma in his maternal grandfather; Heart attack (age of onset: 49) in his father; Heart disease in an other family member; Hypertension in his father; Mental illness in an other family member; Stroke in his paternal grandfather. Allergies  Allergen Reactions   Olmesartan Medoxomil Other (See Comments)    dizziness    Review of Systems  Constitutional:  Negative for chills, fever and malaise/fatigue.  Eyes:  Negative for blurred vision.  Respiratory:  Positive for cough. Negative for hemoptysis and sputum production.   Cardiovascular:  Negative for chest pain and leg swelling.  Neurological:  Negative for dizziness, weakness and headaches.      Objective:     BP (!) 150/60   Pulse 79   Temp 97.8 F (36.6 C) (Oral)   Wt 222 lb 8 oz (100.9 kg)   SpO2 95%   BMI 31.85 kg/m  BP Readings from Last 3 Encounters:  04/01/24 (!) 150/60  03/08/24 (!) 156/70  02/07/24 (!) 170/60   Wt Readings from Last 3 Encounters:  04/01/24 222 lb 8 oz (100.9 kg)  03/08/24 220 lb 1.6 oz (99.8 kg)  02/07/24 220 lb (99.8 kg)      Physical Exam Vitals reviewed.  Constitutional:      Appearance: He is well-developed.  Eyes:     Pupils: Pupils are equal, round, and reactive to light.  Neck:     Thyroid : No thyromegaly.  Cardiovascular:     Rate and Rhythm: Normal rate and regular rhythm.  Pulmonary:     Effort: Pulmonary effort is normal. No respiratory distress.     Breath sounds: Normal breath sounds. No wheezing or rales.  Musculoskeletal:     Cervical back: Neck supple.     Right lower leg: No edema.     Left lower leg: No edema.  Neurological:      Mental Status: He is alert and oriented to person, place, and time.      No results found for any visits on 04/01/24.  Last CBC Lab Results  Component Value Date   WBC 11.4 (H) 03/08/2024   HGB 15.5 03/08/2024   HCT 46.0 03/08/2024   MCV 90.4 03/08/2024   MCH 29.3 01/12/2023   RDW 13.7 03/08/2024   PLT 349.0 03/08/2024   Last metabolic panel Lab Results  Component Value Date   GLUCOSE 138 (H) 03/08/2024   NA 139 03/08/2024   K 4.4 03/08/2024   CL 103 03/08/2024   CO2 26 03/08/2024   BUN 11 03/08/2024   CREATININE 0.81 03/08/2024   GFR 97.74 03/08/2024   CALCIUM  9.7 03/08/2024   PROT 7.0 03/08/2024   ALBUMIN 4.8 03/08/2024   BILITOT 0.5 03/08/2024   ALKPHOS 97 03/08/2024   AST 27 03/08/2024  ALT 45 03/08/2024   ANIONGAP 9 01/12/2023   Last lipids Lab Results  Component Value Date   CHOL 163 03/08/2024   HDL 34.30 (L) 03/08/2024   LDLCALC 73 03/08/2024   LDLDIRECT 113.0 06/16/2021   TRIG 280.0 (H) 03/08/2024   CHOLHDL 5 03/08/2024   Last hemoglobin A1c Lab Results  Component Value Date   HGBA1C 8.5 (H) 03/08/2024      The 10-year ASCVD risk score (Arnett DK, et al., 2019) is: 33.6%    Assessment & Plan:   #1 chronic dry cough.  Possibly related to ACE inhibitor.  Recommend discontinue lisinopril .  Start telmisartan  40 mg once daily.  Be in touch if cough not improving in 2 to 3 weeks  #2 multiple bilateral pulmonary nodules on recent coronary calcium  scan.  Patient is high risk with smoking history.  Recommend consider follow-up CT in 12 months  #3 hypertension.  Improved some after rest today but up initially.  Making blood pressure change as above with telmisartan .  Recheck at 65-month follow-up and consider additional medication if not closer to goal  #4 type 2 diabetes.  Suboptimally controlled with recent A1c 8.5%.  Would recommend SGLT2 medication but he declined.  Continue metformin , Trulicity , Actos .  Recheck in 2 months and if A1c not to goal  at that point consider additional medication  Wolm Scarlet, MD

## 2024-04-20 ENCOUNTER — Other Ambulatory Visit: Payer: Self-pay | Admitting: Family Medicine

## 2024-05-02 ENCOUNTER — Ambulatory Visit (HOSPITAL_COMMUNITY): Payer: Self-pay

## 2024-05-02 ENCOUNTER — Ambulatory Visit: Payer: Self-pay

## 2024-05-10 ENCOUNTER — Ambulatory Visit: Admitting: Nurse Practitioner

## 2024-05-19 ENCOUNTER — Other Ambulatory Visit: Payer: Self-pay | Admitting: Family Medicine

## 2024-06-07 ENCOUNTER — Other Ambulatory Visit: Payer: Self-pay | Admitting: Family Medicine

## 2024-06-21 ENCOUNTER — Other Ambulatory Visit: Payer: Self-pay | Admitting: Family Medicine

## 2024-06-23 ENCOUNTER — Other Ambulatory Visit: Payer: Self-pay | Admitting: Family Medicine

## 2024-07-07 ENCOUNTER — Other Ambulatory Visit: Payer: Self-pay | Admitting: Family Medicine

## 2024-07-19 ENCOUNTER — Other Ambulatory Visit: Payer: Self-pay | Admitting: Family Medicine

## 2024-07-28 ENCOUNTER — Other Ambulatory Visit: Payer: Self-pay | Admitting: Vascular Surgery

## 2024-07-30 ENCOUNTER — Other Ambulatory Visit: Payer: Self-pay | Admitting: Family Medicine

## 2024-08-06 ENCOUNTER — Other Ambulatory Visit (HOSPITAL_COMMUNITY): Payer: Self-pay

## 2024-08-09 ENCOUNTER — Ambulatory Visit: Payer: Self-pay

## 2024-08-09 ENCOUNTER — Ambulatory Visit
Admission: RE | Admit: 2024-08-09 | Discharge: 2024-08-09 | Disposition: A | Discharge status: Home / Self Care | Source: Ambulatory Visit | Attending: Family Medicine | Admitting: Family Medicine

## 2024-08-09 ENCOUNTER — Telehealth: Payer: Self-pay

## 2024-08-09 ENCOUNTER — Ambulatory Visit (INDEPENDENT_AMBULATORY_CARE_PROVIDER_SITE_OTHER): Admitting: Radiology

## 2024-08-09 ENCOUNTER — Other Ambulatory Visit: Payer: Self-pay

## 2024-08-09 VITALS — BP 189/77 | HR 93 | Temp 100.0°F | Resp 22 | Ht 70.0 in | Wt 215.0 lb

## 2024-08-09 DIAGNOSIS — J101 Influenza due to other identified influenza virus with other respiratory manifestations: Secondary | ICD-10-CM

## 2024-08-09 DIAGNOSIS — R6889 Other general symptoms and signs: Secondary | ICD-10-CM

## 2024-08-09 DIAGNOSIS — R059 Cough, unspecified: Secondary | ICD-10-CM

## 2024-08-09 DIAGNOSIS — R509 Fever, unspecified: Secondary | ICD-10-CM

## 2024-08-09 LAB — POC COVID19/FLU A&B COMBO
Covid Antigen, POC: NEGATIVE
Influenza A Antigen, POC: POSITIVE — AB
Influenza B Antigen, POC: NEGATIVE

## 2024-08-09 MED ORDER — HYDROCODONE BIT-HOMATROP MBR 5-1.5 MG/5ML PO SOLN: 5.0000 mL | Freq: Four times a day (QID) | ORAL | 0 refills | Status: DC | PRN

## 2024-08-09 MED ORDER — OSELTAMIVIR PHOSPHATE 75 MG PO CAPS: 75.0000 mg | ORAL_CAPSULE | Freq: Two times a day (BID) | ORAL | 0 refills | Status: AC

## 2024-08-09 NOTE — Telephone Encounter (Signed)
 FYI Only or Action Required?: Action required by provider: request for appointment and refused ED.  Patient was last seen in primary care on 04/01/2024 by Micheal Wolm ORN, MD.  Called Nurse Triage reporting Cough.  Symptoms began several days ago.  Interventions attempted: Other: unsure.  Symptoms are: gradually worsening.  Triage Disposition: Go to ED Now (Notify PCP)  Patient/caregiver understands and will follow disposition?: No, wishes to speak with PCP                                  1. ONSET: When did the cough begin?      Within past few days, worsened last night 5. DIFFICULTY BREATHING: Are you having difficulty breathing? If Yes, ask: How bad is it? (e.g., mild, moderate, severe)      States he feels SOB especially after a coughing spell, reports SOB all the time, even at rest, patient spoke in phrases while on phone with this RN, states he does not have access to a pulse oximeter 6. FEVER: Do you have a fever? If Yes, ask: What is your temperature, how was it measured, and when did it start?     Unsure 10. OTHER SYMPTOMS: Do you have any other symptoms? (e.g., runny nose, wheezing, chest pain)     Head congestion, soreness in chest due to cough, wheezing     This RN advised ED. Patient refused ED and stated he would rather be seen in the office by his PCP. Please advise.   Copied from CRM #[id number]. Topic: Clinical - Red Word Triage >> Aug 09, 2024  8:24 AM Emylou G wrote: [facility] that prompted transfer to Nurse Triage: congestion and bad cough.. been in bed since the afternoon.. cough - deep weird cough she said.  ( Strange sound )  Reason for Disposition  [1] MODERATE difficulty breathing (e.g., speaks in phrases, SOB even at rest, pulse 100-120) AND [2] still present when not coughing  Protocols used: Cough - Acute Productive-A-AH

## 2024-08-09 NOTE — ED Triage Notes (Signed)
 Pt presents with complaints of cough, SOB, fatigue, and weakness. Accompanied by spouse. States he has been in bed since 6 PM last night. RR is 22 in triage room. O2 is 92% room air. Denies sick contacts. Has been taking OTC Nyquil for symptoms reported with no improvement. Pt is pale. NP on staff is made aware.

## 2024-08-09 NOTE — Telephone Encounter (Signed)
 I spoke with the patient's wife and advised her that we do not have any appointments for today and we advise the patient be seen at nearest urgent care for treatment. Appt scheduled for 08/12/2024 per request

## 2024-08-09 NOTE — Telephone Encounter (Signed)
 This RN called [facility] to notify of ED refusal. This RN spoke to [place].

## 2024-08-09 NOTE — Telephone Encounter (Signed)
 Noted.  [name] MD [facility] at [facility]

## 2024-08-09 NOTE — Telephone Encounter (Signed)
 ATC but could not reach patient at this time

## 2024-08-09 NOTE — ED Provider Notes (Signed)
 [facility]    CSN: [id number] Arrival date & time: 08/09/24  1043      History   Chief Complaint Chief Complaint  Patient presents with   Cough    Shortness of breath - Entered by patient   Shortness of Breath    HPI Jason [name] is a 58 y.o. male.   HPI pleasant 57 year old male presents with cough, fever and shortness of breath for 4-5 days.  Patient is accompanied by his wife this morning PMH significant for T2DM with complication, HTN, daily tobacco use, and history of alcohol abuse.  Patient is accompanied by his wife this morning.  Patient is currently on Plavix  and denies any unusual bleeding.  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 03/10/2020   Primary osteoarthritis of right hip 07/12/2019   Avascular necrosis (HCC) 06/15/2019   PAD (peripheral artery disease) 12/11/2017   Preoperative cardiovascular examination 12/05/2017   Peripheral arterial disease 10/23/2017   History of MRSA infection 10/23/2017   Intermittent claudication 10/17/2017   Herniated cervical intervertebral disc 08/01/2017   Osteoarthritis of spine 08/01/2017   Poorly controlled type 2 diabetes mellitus with circulatory disorder (HCC) 07/11/2017   Polyarthralgia 06/27/2017   Cervical pain (neck) 06/27/2017   Primary osteoarthritis of both knees 05/02/2017   Sensory neuronopathy 01/14/2017    Transaminasemia 10/10/2016   Vitamin B 12 deficiency 09/19/2016   Hyperglycemia 09/19/2016   Chest pressure 07/04/2012   Cough 07/04/2012   Hyperlipidemia 10/12/2010   Anxiety state 10/12/2010   History of alcohol abuse 10/12/2010   TOBACCO USE 10/12/2010   DEPRESSION 10/12/2010   Essential hypertension, benign 10/12/2010   GERD 10/12/2010   Osteoarthritis 10/12/2010   Sleep apnea 10/12/2010    Past Surgical History:  Procedure Laterality Date   ABDOMINAL AORTOGRAM W/LOWER EXTREMITY N/A 12/01/2017   Procedure: ABDOMINAL AORTOGRAM W/LOWER EXTREMITY;  Surgeon: Harvey Carlin BRAVO, MD;  Location: [facility] INVASIVE CV LAB;  Service: Cardiovascular;  Laterality: N/A;   ABDOMINAL AORTOGRAM W/LOWER EXTREMITY Bilateral 07/21/2021   Procedure: ABDOMINAL AORTOGRAM W/LOWER EXTREMITY;  Surgeon: Lanis Fonda BRAVO, MD;  Location: [facility] INVASIVE CV LAB;  Service: Cardiovascular;  Laterality: Bilateral;   FEMORAL-POPLITEAL BYPASS GRAFT Right 12/11/2017   Procedure: BYPASS GRAFT RIGHT FEMORAL-ABOVE KNEE POPLITEAL ARTERY with Non Reversed Greater Saphenous Vein and Angioplasty distal vein Graft.;  Surgeon: Harvey Carlin BRAVO, MD;  Location: [facility] OR;  Service: Vascular;  Laterality: Right;   IR ANGIO EXTERNAL CAROTID SEL EXT CAROTID UNI L MOD SED  01/12/2023   IR ANGIO INTRA EXTRACRAN SEL COM CAROTID INNOMINATE UNI R MOD SED  01/12/2023   IR ANGIO INTRA EXTRACRAN SEL INTERNAL CAROTID UNI L MOD SED  01/12/2023   IR ANGIO VERTEBRAL SEL VERTEBRAL BILAT MOD SED  01/12/2023   IR US  GUIDE VASC ACCESS RIGHT  01/12/2023  PERIPHERAL VASCULAR BALLOON ANGIOPLASTY Right 12/01/2017   Procedure: PERIPHERAL VASCULAR BALLOON ANGIOPLASTY;  Surgeon: Harvey Carlin BRAVO, MD;  Location: [facility] INVASIVE CV LAB;  Service: Cardiovascular;  Laterality: Right;  superficial femoral, attempted   PERIPHERAL VASCULAR INTERVENTION Left 12/01/2017   Procedure: PERIPHERAL VASCULAR INTERVENTION;  Surgeon: Harvey Carlin BRAVO, MD;  Location: [facility] INVASIVE CV LAB;   Service: Cardiovascular;  Laterality: Left;  External iliac   PERIPHERAL VASCULAR INTERVENTION Left 07/21/2021   Procedure: PERIPHERAL VASCULAR INTERVENTION;  Surgeon: Lanis Fonda BRAVO, MD;  Location: [facility] INVASIVE CV LAB;  Service: Cardiovascular;  Laterality: Left;   ROTATOR CUFF REPAIR     TONSILLECTOMY     VEIN HARVEST Right 12/11/2017   Procedure: RIGHT GREATER SAPHENOUS VEIN HARVEST;  Surgeon: Harvey Carlin BRAVO, MD;  Location: [facility] OR;  Service: Vascular;  Laterality: Right;       Home Medications    Prior to Admission medications  Medication Sig Start Date End Date Taking? Authorizing Provider  HYDROcodone bit-homatropine (HYCODAN) 5-1.5 MG/5ML syrup Take 5 mLs by mouth every 6 (six) hours as needed for cough. 08/09/24  Yes Teddy Sharper, FNP  oseltamivir (TAMIFLU) 75 MG capsule Take 1 capsule (75 mg total) by mouth every 12 (twelve) hours. 08/09/24  Yes Teddy Sharper, FNP  atorvastatin  (LIPITOR) 80 MG tablet TAKE 1 TABLET BY MOUTH EVERY DAY 06/21/24   Burchette, Wolm ORN, MD  busPIRone  (BUSPAR ) 15 MG tablet TAKE ONE HALF TABLET BY MOUTH TWICE DAILY FOR ONE WEEK AND THEN INCREASE TO ONE TWICE DAILY. 07/08/24   Burchette, Wolm ORN, MD  clopidogrel  (PLAVIX ) 75 MG tablet TAKE 1 TABLET BY MOUTH EVERY DAY 01/09/24   Robins, Joshua E, MD  CONTOUR NEXT TEST test strip TEST GLUCOSE ONCE DAILY. DX E 11.9 03/03/20   Burchette, Wolm ORN, MD  [facility] ASPIRIN  LOW DOSE 81 MG tablet TAKE 1 TABLET (81 MG TOTAL) BY MOUTH DAILY. SWALLOW WHOLE. 01/24/24   Lanis Fonda BRAVO, MD  diclofenac  (VOLTAREN ) 75 MG EC tablet TAKE 1 TABLET BY MOUTH TWICE A DAY 07/31/24   Burchette, Wolm ORN, MD  Dulaglutide  (TRULICITY ) 0.75 MG/0.5ML SOAJ INJECT 0.75 MG SUBCUTANEOUSLY ONE TIME PER WEEK 03/20/24   Burchette, Wolm ORN, MD  ezetimibe  (ZETIA ) 10 MG tablet TAKE 1 TABLET BY MOUTH EVERY DAY 03/13/24   Burchette, Wolm ORN, MD  gabapentin  (NEURONTIN ) 300 MG capsule TAKE ONE CAPSULE BY MOUTH EVERY MORNING AND TAKE 2 CAPSULES BY MOUTH AT BEDTIME  10/29/21   Burchette, Wolm ORN, MD  Lancets MISC Contour Lancets.  Test glucose once daily. DxE11.9 07/14/17   Burchette, Wolm ORN, MD  metFORMIN  (GLUCOPHAGE ) 500 MG tablet TAKE 2 TABLETS BY MOUTH TWICE A DAY 07/19/24   Burchette, Wolm ORN, MD  pioglitazone  (ACTOS ) 15 MG tablet TAKE 1 TABLET (15 MG TOTAL) BY MOUTH DAILY. 06/07/24   Burchette, Wolm ORN, MD  sertraline  (ZOLOFT ) 50 MG tablet TAKE 1 TABLET BY MOUTH EVERY DAY 05/20/24   Burchette, Wolm ORN, MD  telmisartan  (MICARDIS ) 40 MG tablet Take 1 tablet (40 mg total) by mouth daily. 04/01/24   Burchette, Wolm ORN, MD    Family History Family History  Problem Relation Age of Onset   Heart attack Father 96   Hypertension Father    Arthritis Mother    Asthma Maternal Grandfather    Stroke Paternal Grandfather    Heart disease Other    Mental illness Other    Cancer Neg Hx    Diabetes Neg Hx    Drug abuse Neg  Hx    Early death Neg Hx    Hearing loss Neg Hx    Hyperlipidemia Neg Hx     Social History Social History[1]   Allergies   Olmesartan medoxomil   Review of Systems Review of Systems  Constitutional:  Positive for chills, fatigue and fever.  Respiratory:  Positive for cough and shortness of breath.   Musculoskeletal:  Positive for arthralgias and myalgias.  All other systems reviewed and are negative.    Physical Exam Triage Vital Signs ED Triage Vitals  Encounter Vitals Group     BP      Girls Systolic BP Percentile      Girls Diastolic BP Percentile      Boys Systolic BP Percentile      Boys Diastolic BP Percentile      Pulse      Resp      Temp      Temp src      SpO2      Weight      Height      Head Circumference      Peak Flow      Pain Score      Pain Loc      Pain Education      Exclude from Growth Chart    No data found.  Updated Vital Signs BP (!) 189/77 (BP Location: Right Arm)   Pulse 93   Temp 100 F (37.8 C) (Oral)   Resp (!) 22   Ht 5' 10 (1.778 m)   Wt 215 lb (97.5 kg)   SpO2 95%    BMI 30.85 kg/m    Physical Exam Vitals and nursing note reviewed.  Constitutional:      Appearance: Normal appearance. He is normal weight.  HENT:     Head: Normocephalic and atraumatic.     Right Ear: Tympanic membrane, ear canal and external ear normal.     Left Ear: Tympanic membrane, ear canal and external ear normal.     Mouth/Throat:     Mouth: Mucous membranes are moist.     Pharynx: Oropharynx is clear.  Eyes:     Extraocular Movements: Extraocular movements intact.     Conjunctiva/sclera: Conjunctivae normal.     Pupils: Pupils are equal, round, and reactive to light.  Cardiovascular:     Rate and Rhythm: Normal rate and regular rhythm.     Pulses: Normal pulses.     Heart sounds: Normal heart sounds.  Pulmonary:     Effort: Pulmonary effort is normal.     Breath sounds: Normal breath sounds. No wheezing, rhonchi or rales.  Musculoskeletal:        General: Normal range of motion.     Cervical back: Normal range of motion.  Skin:    General: Skin is warm and dry.  Neurological:     General: No focal deficit present.     Mental Status: He is alert and oriented to person, place, and time. Mental status is at baseline.      UC Treatments / Results  Labs (all labs ordered are listed, but only abnormal results are displayed) Labs Reviewed  POC COVID19/FLU A&B COMBO - Abnormal; Notable for the following components:      Result Value   Influenza A Antigen, POC Positive (*)    All other components within normal limits    EKG   Radiology DG Chest 2 View Result Date: 08/09/2024 CLINICAL DATA:  Cough for 5 days EXAM:  CHEST - 2 VIEW COMPARISON:  October 12, 2010 FINDINGS: The heart size and mediastinal contours are within normal limits. Both lungs are clear. The visualized skeletal structures are unremarkable. IMPRESSION: No active cardiopulmonary disease. Electronically Signed   By: Lynwood Landy Raddle M.D.   On: 08/09/2024 12:54    Procedures Procedures (including  critical care time)  Medications Ordered in UC Medications - No data to display  Initial Impression / Assessment and Plan / UC Course  I have reviewed the triage vital signs and the nursing notes.  Pertinent labs & imaging results that were available during my care of the patient were reviewed by me and considered in my medical decision making (see chart for details).     MDM: 1.  Influenza A-Rx'd Tamiflu 75 mg capsule: Take 1 capsule twice daily x 5 days; 2.  Cough, unspecified type-CXR results revealed above, patient advised Rx'd Hycodan 5-1.5 mg / 5 mL syrup: Take 5 mL every 6 hours, as needed for cough; 3.  Fever, unspecified type-Advised may take OTC Tylenol  1000 mg every 6 hours for fever (oral temperature greater than 100.4).  Advised may take Hycodan cough syrup at night prior to sleep for cough due to sedative effects.  Encouraged increase daily water intake to 64 ounces per day while taking these medications.  Advised I will follow-up with chest x-ray results in the next several hours.  Advised if symptoms worsen and/or unresolved please follow-up with PCP or here for further evaluation.  Patient discharged home, hemodynamically stable. Final Clinical Impressions(s) / UC Diagnoses   Final diagnoses:  Influenza-like symptoms  Cough, unspecified type  Influenza A  Fever, unspecified     Discharge Instructions      Advised patient take medication as directed with food to completion.  Advised may take OTC Tylenol  1000 mg every 6 hours for fever (oral temperature greater than 100.4).  Advised may take Hycodan cough syrup at night prior to sleep for cough due to sedative effects.  Encouraged increase daily water intake to 64 ounces per day while taking these medications.  Advised I will follow-up with chest x-ray results in the next several hours.  Advised if symptoms worsen and/or unresolved please follow-up with PCP or here for further evaluation.     ED Prescriptions      Medication Sig Dispense Auth. Provider   oseltamivir (TAMIFLU) 75 MG capsule Take 1 capsule (75 mg total) by mouth every 12 (twelve) hours. 10 capsule Airabella Barley, FNP   HYDROcodone bit-homatropine (HYCODAN) 5-1.5 MG/5ML syrup Take 5 mLs by mouth every 6 (six) hours as needed for cough. 120 mL Teddy Sharper, FNP      I have reviewed the PDMP during this encounter.    [1]  Social History Tobacco Use   Smoking status: Every Day    Current packs/day: 2.00    Average packs/day: 2.0 packs/day for 25.0 years (50.0 ttl pk-yrs)    Types: Cigarettes   Smokeless tobacco: Never   Tobacco comments:    30 cigarettes per day  Vaping Use   Vaping status: Never Used  Substance Use Topics   Alcohol use: Not Currently    Alcohol/week: 20.0 standard drinks of alcohol    Types: 20 Glasses of wine per week    Comment: not since Jan 2018   Drug use: No     Teddy Sharper, FNP 08/09/24 1324

## 2024-08-09 NOTE — Telephone Encounter (Signed)
 Copied from CRM #[id number]. Topic: Clinical - Pink Word Triage >> Aug 09, 2024  2:35 PM Alfonso HERO wrote: Red Word that prompted transfer to Nurse Triage: patient had xrays done and waiting on results but wife wants to know if he should wait to take and before taking antibiotics because he has also been diagnosed with type A flu. She is asking for someone to call her back.

## 2024-08-09 NOTE — Discharge Instructions (Addendum)
 Advised patient take medication as directed with food to completion.  Advised may take OTC Tylenol  1000 mg every 6 hours for fever (oral temperature greater than 100.4).  Advised may take Hycodan cough syrup at night prior to sleep for cough due to sedative effects.  Encouraged increase daily water intake to 64 ounces per day while taking these medications.  Advised I will follow-up with chest x-ray results in the next several hours.  Advised if symptoms worsen and/or unresolved please follow-up with PCP or here for further evaluation.

## 2024-08-12 ENCOUNTER — Ambulatory Visit: Admitting: Family Medicine

## 2024-08-12 ENCOUNTER — Other Ambulatory Visit (HOSPITAL_COMMUNITY): Payer: Self-pay

## 2024-08-12 ENCOUNTER — Telehealth: Payer: Self-pay

## 2024-08-12 NOTE — Telephone Encounter (Signed)
 Pharmacy Patient Advocate Encounter   Received notification from Onbase that prior authorization for TRULICITY  0.75 MG/0.5 ML PEN is required/requested.   Insurance verification completed.   The patient is insured through [employer].   Per test claim: PA required; PA submitted to above mentioned insurance via Latent Key/confirmation #/EOC [id number] Status is pending

## 2024-08-12 NOTE — Telephone Encounter (Signed)
 Patient's wife informed of the message below and voiced understanding

## 2024-08-13 ENCOUNTER — Other Ambulatory Visit (HOSPITAL_COMMUNITY): Payer: Self-pay

## 2024-08-14 ENCOUNTER — Other Ambulatory Visit (HOSPITAL_COMMUNITY): Payer: Self-pay

## 2024-08-14 NOTE — Telephone Encounter (Signed)
 PA has been approved but patient will have to enroll in [employer] before pharmacy can fill prescription through insurance. Insurance will reach out to patient with information on how to enroll.

## 2024-08-22 ENCOUNTER — Other Ambulatory Visit: Payer: Self-pay | Admitting: Family Medicine

## 2024-08-31 ENCOUNTER — Other Ambulatory Visit: Payer: Self-pay | Admitting: Family Medicine

## 2024-09-06 ENCOUNTER — Other Ambulatory Visit: Payer: Self-pay | Admitting: Vascular Surgery

## 2024-09-06 DIAGNOSIS — I739 Peripheral vascular disease, unspecified: Secondary | ICD-10-CM

## 2024-09-06 DIAGNOSIS — I6522 Occlusion and stenosis of left carotid artery: Secondary | ICD-10-CM

## 2024-09-09 ENCOUNTER — Other Ambulatory Visit: Payer: Self-pay | Admitting: Family Medicine

## 2024-09-09 ENCOUNTER — Ambulatory Visit: Payer: Self-pay

## 2024-09-09 NOTE — Telephone Encounter (Signed)
 FYI Only or Action Required?: FYI only for provider: appointment scheduled on 09/10/24.  Patient was last seen in primary care on 04/01/2024 by Micheal Wolm ORN, MD.  Called Nurse Triage reporting Foot Pain.  Symptoms began several weeks ago.  Interventions attempted: Prescription medications: gabapentin .  Symptoms are: gradually worsening.  Triage Disposition: See PCP When Office is Open (Within 3 Days)  Patient/caregiver understands and will follow disposition?: Yes      Patient's wife called and states the patient is at work.  She is not sure exactly which leg is hurting, she believes both since he said his legs were hurting.  She states she will call her husband (the patient) and get him to call back to be triaged.   Copied from CRM [id number]. Topic: Clinical - Red Word Triage >> Sep 09, 2024 11:03 AM Treva T wrote: [facility] that prompted transfer to Nurse Triage: Pt spouse calling on behalf of pt states he is having pain in his legs, and burning and unable to sleep.   Pt requesting an appt for evaluation.    Reason for Disposition  [1] Weakness or numbness in foot or toes AND [2] present > 2 weeks  Answer Assessment - Initial Assessment Questions Pt returned call to schedule appt with PCP to discuss neuropathy. Pt states he has tried gabapentin  but burning and numbness in his toes is worsening. PT states he is unable to sleep d/t symptoms. Appointment scheduled for evaluation. Patient agrees with plan of care, and will call back if anything changes, or if symptoms worsen.     1. ONSET: When did the pain start?      Worsening; pt states burning and numbness in toes is affecting sleep   2. LOCATION: Where is the pain located?      Feet/ toes   3. PAIN: How bad is the pain?    (Scale 1-10; or mild, moderate, severe)     Moderate   4. WORK OR EXERCISE: Has there been any recent work or exercise that involved this part of the body?      No   5. CAUSE: What  do you think is causing the foot pain?     Neuropathy   6. OTHER SYMPTOMS: Do you have any other symptoms? (e.g., leg pain, rash, fever, numbness)     No  Protocols used: Foot Pain-A-AH

## 2024-09-09 NOTE — Telephone Encounter (Signed)
" °  Patient's wife called and states the patient is at work.  She is not sure exactly which leg is hurting, she believes both since he said his legs were hurting.  She states she will call her husband (the patient) and get him to call back to be triaged.               Copied from CRM [id number]. Topic: Clinical - Red Word Triage >> Sep 09, 2024 11:03 AM Treva T wrote: [facility] that prompted transfer to Nurse Triage: Pt spouse calling on behalf of pt states he is having pain in his legs, and burning and unable to sleep.   Pt requesting an appt for evaluation. "

## 2024-09-10 ENCOUNTER — Ambulatory Visit (INDEPENDENT_AMBULATORY_CARE_PROVIDER_SITE_OTHER): Admitting: Family Medicine

## 2024-09-10 ENCOUNTER — Encounter: Payer: Self-pay | Admitting: Family Medicine

## 2024-09-10 VITALS — BP 150/64 | HR 83 | Temp 97.6°F | Wt 230.1 lb

## 2024-09-10 DIAGNOSIS — Z1211 Encounter for screening for malignant neoplasm of colon: Secondary | ICD-10-CM

## 2024-09-10 DIAGNOSIS — E1159 Type 2 diabetes mellitus with other circulatory complications: Secondary | ICD-10-CM | POA: Diagnosis not present

## 2024-09-10 DIAGNOSIS — I739 Peripheral vascular disease, unspecified: Secondary | ICD-10-CM

## 2024-09-10 DIAGNOSIS — Z7985 Long-term (current) use of injectable non-insulin antidiabetic drugs: Secondary | ICD-10-CM

## 2024-09-10 DIAGNOSIS — E1140 Type 2 diabetes mellitus with diabetic neuropathy, unspecified: Secondary | ICD-10-CM

## 2024-09-10 DIAGNOSIS — E1165 Type 2 diabetes mellitus with hyperglycemia: Secondary | ICD-10-CM | POA: Diagnosis not present

## 2024-09-10 MED ORDER — TIRZEPATIDE 2.5 MG/0.5ML ~~LOC~~ SOAJ: 2.5000 mg | SUBCUTANEOUS | 1 refills | Status: AC

## 2024-09-10 NOTE — Patient Instructions (Signed)
 May increase Lyrica  if needed in one week to two tablets three times daily  STOP the Trulicity  and START Mounjaro  and give me feedback in one month if tolerated so we can increase the Mounjaro .

## 2024-09-10 NOTE — Progress Notes (Signed)
 "  Established Patient Office Visit  Subjective   Patient ID: Jason [name], male    DOB: 04-21-1966  Age: 59 y.o. MRN: [id number]  Chief Complaint  Patient presents with   Peripheral Neuropathy    HPI   Jason [name] has multiple chronic problems including history of hypertension, peripheral vascular disease, type 2 diabetes poorly controlled, sleep apnea, GERD, diabetic peripheral neuropathy, osteoarthritis involving multiple joints, history of B12 deficiency, history of alcohol use disorder, history of nicotine  use  Seen today with main complaint of worsening night pain especially in his toes and feet.  He has severe sharp lancinating pain.  Tried gabapentin  previously but did not see much benefit.  Does not recall trying Lyrica .  Symptoms have been very disruptive to sleep.  He thinks he has tried Cymbalta  previously without success.  He does have poorly controlled type 2 diabetes.  Diabetes currently treated with Trulicity .  He had some recent difficulties getting this through the pharmacy.  Also takes metformin  and Actos ..  Has hyperlipidemia treated with atorvastatin .  History of intermittent poor compliance with medication.  He has peripheral vascular disease and had missed follow-up with his vascular specialist previously and requesting a referral to their new location.  He also had been referred for colonoscopy.  Never had screening colonoscopy and he had problems that came up with last visit and is requesting repeat referral.  Past Medical History:  Diagnosis Date   Anxiety    Depression    Diabetes mellitus without complication (HCC)    GERD (gastroesophageal reflux disease)    Headache(784.0)    Hyperlipidemia    Hypertension    MRSA infection    Left shin, back   Nerve damage    BLE   Neuromuscular disorder (HCC)    Osteoarthritis    Peripheral vascular disease    Pneumonia    Past Surgical History:  Procedure Laterality Date   ABDOMINAL AORTOGRAM W/LOWER  EXTREMITY N/A 12/01/2017   Procedure: ABDOMINAL AORTOGRAM W/LOWER EXTREMITY;  Surgeon: Harvey Carlin BRAVO, MD;  Location: [facility] INVASIVE CV [facility];  Service: Cardiovascular;  Laterality: N/A;   ABDOMINAL AORTOGRAM W/LOWER EXTREMITY Bilateral 07/21/2021   Procedure: ABDOMINAL AORTOGRAM W/LOWER EXTREMITY;  Surgeon: Lanis Fonda BRAVO, MD;  Location: [facility] CV LAB;  Service: Cardiovascular;  Laterality: Bilateral;   FEMORAL-POPLITEAL BYPASS GRAFT Right 12/11/2017   Procedure: BYPASS GRAFT RIGHT FEMORAL-ABOVE KNEE POPLITEAL ARTERY with Non Reversed Greater Saphenous Vein and Angioplasty distal vein Graft.;  Surgeon: Harvey Carlin BRAVO, MD;  Location: [facility] OR;  Service: Vascular;  Laterality: Right;   IR ANGIO EXTERNAL CAROTID SEL EXT CAROTID UNI L MOD SED  01/12/2023   IR ANGIO INTRA EXTRACRAN SEL COM CAROTID INNOMINATE UNI R MOD SED  01/12/2023   IR ANGIO INTRA EXTRACRAN SEL INTERNAL CAROTID UNI L MOD SED  01/12/2023   IR ANGIO VERTEBRAL SEL VERTEBRAL BILAT MOD SED  01/12/2023   IR US  GUIDE VASC ACCESS RIGHT  01/12/2023   PERIPHERAL VASCULAR BALLOON ANGIOPLASTY Right 12/01/2017   Procedure: PERIPHERAL VASCULAR BALLOON ANGIOPLASTY;  Surgeon: Harvey Carlin BRAVO, MD;  Location: [facility];  Service: Cardiovascular;  Laterality: Right;  superficial femoral, attempted   PERIPHERAL VASCULAR INTERVENTION Left 12/01/2017   Procedure: PERIPHERAL VASCULAR INTERVENTION;  Surgeon: Harvey Carlin BRAVO, MD;  Location: [facility] CV [facility];  Service: Cardiovascular;  Laterality: Left;  External iliac   PERIPHERAL VASCULAR INTERVENTION Left 07/21/2021   Procedure: PERIPHERAL VASCULAR INTERVENTION;  Surgeon: Lanis Fonda BRAVO, MD;  Location[name]AS[facility]  CV LAB;  Service: Cardiovascular;  Laterality: Left;   ROTATOR CUFF REPAIR     TONSILLECTOMY     VEIN HARVEST Right 12/11/2017   Procedure: RIGHT GREATER SAPHENOUS VEIN HARVEST;  Surgeon: Harvey Carlin BRAVO, MD;  Location: [facility] OR;  Service: Vascular;  Laterality: Right;    reports  that he has been smoking cigarettes. He has a 50 pack-year smoking history. He has never used smokeless tobacco. He reports that he does not currently use alcohol after a past usage of about 20.0 standard drinks of alcohol per week. He reports that he does not use drugs. family history includes Arthritis in his mother; Asthma in his maternal grandfather; Heart attack (age of onset: 72) in his father; Heart disease in an other family member; Hypertension in his father; Mental illness in an other family member; Stroke in his paternal grandfather. Allergies[1] ' Review of Systems  Eyes:  Negative for blurred vision.  Cardiovascular:  Negative for chest pain.  Gastrointestinal:  Negative for abdominal pain.  Neurological:  Negative for dizziness, weakness and headaches.      Objective:     BP (!) 150/64   Pulse 83   Temp 97.6 F (36.4 C) (Oral)   Wt 230 lb 1.6 oz (104.4 kg)   SpO2 95%   BMI 33.02 kg/m  BP Readings from Last 3 Encounters:  09/10/24 (!) 150/64  08/09/24 (!) 189/77  04/01/24 (!) 150/60   Wt Readings from Last 3 Encounters:  09/10/24 230 lb 1.6 oz (104.4 kg)  08/09/24 215 lb (97.5 kg)  04/01/24 222 lb 8 oz (100.9 kg)      Physical Exam Vitals reviewed.  Constitutional:      General: He is not in acute distress.    Appearance: He is not ill-appearing.  Cardiovascular:     Rate and Rhythm: Normal rate and regular rhythm.  Pulmonary:     Effort: Pulmonary effort is normal.     Breath sounds: Normal breath sounds.  Neurological:     Mental Status: He is alert.      No results found for any visits on 09/10/24.    The 10-year ASCVD risk score (Arnett DK, et al., 2019) is: 35.1%    Assessment & Plan:   #1 diabetic peripheral neuropathy pain.  History of poorly controlled diabetes.  See below.  Previous poor response with Cymbalta  and did not do great with gabapentin  either.  Recommend trial of Lyrica  50 mg 3 times daily and if no improvement in 1 week  double up to 100 mg 3 times daily.  Set up follow-up in couple months to reassess  #2 history of peripheral vascular disease.  Longstanding history of nicotine  use.  Multiple other risk factors including hyperlipidemia, hypertension, type 2 diabetes.  Set up follow-up with his vascular surgeon  #3 type 2 diabetes poorly controlled.  Patient has been on Trulicity  but having difficulty getting medication.  We recommended discontinue Trulicity  and start more efficacious/potent Mounjaro  2.5 mg subcutaneous once weekly and give feedback in 1 month so we can titrate to 5 mg.  Plan A1c repeat at 2 to 67-month follow-up.  We elected not to check A1c today since he has been somewhat inconsistent with Trulicity  recently and since we are making switch to Mounjaro .  We will plan to repeat A1c at follow-up after he has been on this for a few months  #4 health maintenance.  Patient in need of initial screening colonoscopy.  He is requesting that we repeat  referral for that  Wolm Scarlet, MD     [1]  Allergies Allergen Reactions   Olmesartan Medoxomil Other (See Comments)    dizziness   "

## 2024-09-11 ENCOUNTER — Telehealth: Payer: Self-pay | Admitting: *Deleted

## 2024-09-11 MED ORDER — PREGABALIN 50 MG PO CAPS: 50.0000 mg | ORAL_CAPSULE | Freq: Three times a day (TID) | ORAL | 5 refills | Status: AC

## 2024-09-11 NOTE — Telephone Encounter (Signed)
 My apologies.  This was supposed to be sent yesterday and I got side tracked.  I have sent the Lyrica  in.  Wolm LELON Scarlet MD [facility]

## 2024-09-11 NOTE — Telephone Encounter (Signed)
 Copied from CRM [id number]. Topic: Clinical - Medication Question >> Sep 11, 2024  3:21 PM Burnard DEL wrote: Reason for CRM: Patient wife called in after reviewing VS from yesterdays appointment  would like to know if her husband was suppose to have  a prescription for Lyrica  sent to pharmacy for him?

## 2024-09-12 ENCOUNTER — Telehealth: Payer: Self-pay

## 2024-09-12 ENCOUNTER — Other Ambulatory Visit (HOSPITAL_COMMUNITY): Payer: Self-pay

## 2024-09-12 NOTE — Telephone Encounter (Signed)
 Patient is needing PA for Mounjaro  2.5 mg Covermymeds  Key: [id number]

## 2024-09-12 NOTE — Telephone Encounter (Signed)
 Pharmacy Patient Advocate Encounter   Received notification from Physician's Office that prior authorization for Mounjaro  2.5MG /0.5ML auto-injectors is required/requested.   Insurance verification completed.   The patient is insured through [employer].   Per test claim: PA required; PA submitted to above mentioned insurance via Latent Key/confirmation #/EOC [id number] Status is pending

## 2024-09-12 NOTE — Telephone Encounter (Signed)
 Noted

## 2024-09-24 ENCOUNTER — Other Ambulatory Visit (HOSPITAL_COMMUNITY): Payer: Self-pay

## 2024-10-02 ENCOUNTER — Other Ambulatory Visit: Payer: Self-pay | Admitting: Family Medicine

## 2024-11-21 ENCOUNTER — Ambulatory Visit (HOSPITAL_COMMUNITY): Payer: Self-pay

## 2024-11-21 ENCOUNTER — Ambulatory Visit: Payer: Self-pay
